# Patient Record
Sex: Female | Born: 1935 | Race: White | Hispanic: No | Marital: Single | Smoking: Never smoker
Health system: Southern US, Community
[De-identification: ages and names within clinical notes are randomized; demographics above are authoritative.]

## PROBLEM LIST (undated history)

## (undated) DIAGNOSIS — N289 Disorder of kidney and ureter, unspecified: Secondary | ICD-10-CM

## (undated) DIAGNOSIS — M51369 Other intervertebral disc degeneration, lumbar region without mention of lumbar back pain or lower extremity pain: Secondary | ICD-10-CM

## (undated) DIAGNOSIS — I10 Essential (primary) hypertension: Secondary | ICD-10-CM

## (undated) DIAGNOSIS — E7800 Pure hypercholesterolemia, unspecified: Secondary | ICD-10-CM

## (undated) DIAGNOSIS — R05 Cough: Secondary | ICD-10-CM

## (undated) DIAGNOSIS — I509 Heart failure, unspecified: Secondary | ICD-10-CM

## (undated) DIAGNOSIS — M1990 Unspecified osteoarthritis, unspecified site: Secondary | ICD-10-CM

## (undated) DIAGNOSIS — E213 Hyperparathyroidism, unspecified: Secondary | ICD-10-CM

## (undated) DIAGNOSIS — J45909 Unspecified asthma, uncomplicated: Secondary | ICD-10-CM

## (undated) DIAGNOSIS — R233 Spontaneous ecchymoses: Secondary | ICD-10-CM

## (undated) DIAGNOSIS — M810 Age-related osteoporosis without current pathological fracture: Secondary | ICD-10-CM

## (undated) DIAGNOSIS — K219 Gastro-esophageal reflux disease without esophagitis: Secondary | ICD-10-CM

## (undated) DIAGNOSIS — R609 Edema, unspecified: Secondary | ICD-10-CM

## (undated) DIAGNOSIS — R238 Other skin changes: Secondary | ICD-10-CM

## (undated) DIAGNOSIS — R059 Cough, unspecified: Secondary | ICD-10-CM

## (undated) DIAGNOSIS — L0390 Cellulitis, unspecified: Secondary | ICD-10-CM

## (undated) DIAGNOSIS — R21 Rash and other nonspecific skin eruption: Secondary | ICD-10-CM

## (undated) DIAGNOSIS — M5136 Other intervertebral disc degeneration, lumbar region: Secondary | ICD-10-CM

## (undated) HISTORY — DX: Essential (primary) hypertension: I10

## (undated) HISTORY — PX: EYE SURGERY: SHX253

## (undated) HISTORY — PX: VITRECTOMY: SHX106

## (undated) HISTORY — PX: CHOLECYSTECTOMY: SHX55

## (undated) HISTORY — PX: BREAST REDUCTION SURGERY: SHX8

## (undated) HISTORY — DX: Cough: R05

## (undated) HISTORY — PX: LIPECTOMY: SHX11

## (undated) HISTORY — DX: Unspecified asthma, uncomplicated: J45.909

## (undated) HISTORY — DX: Other skin changes: R23.8

## (undated) HISTORY — PX: TONSILLECTOMY: SUR1361

## (undated) HISTORY — PX: FEMUR SURGERY: SHX943

## (undated) HISTORY — DX: Gastro-esophageal reflux disease without esophagitis: K21.9

## (undated) HISTORY — DX: Cough, unspecified: R05.9

## (undated) HISTORY — PX: TUBAL LIGATION: SHX77

## (undated) HISTORY — DX: Spontaneous ecchymoses: R23.3

## (undated) HISTORY — DX: Rash and other nonspecific skin eruption: R21

## (undated) HISTORY — PX: APPENDECTOMY: SHX54

## (undated) HISTORY — PX: GALLBLADDER SURGERY: SHX652

## (undated) HISTORY — PX: THYROID SURGERY: SHX805

## (undated) HISTORY — DX: Edema, unspecified: R60.9

## (undated) HISTORY — DX: Unspecified osteoarthritis, unspecified site: M19.90

## (undated) HISTORY — DX: Age-related osteoporosis without current pathological fracture: M81.0

## (undated) HISTORY — PX: PAROTID GLAND TUMOR EXCISION: SHX5221

## (undated) HISTORY — DX: Heart failure, unspecified: I50.9

## (undated) HISTORY — DX: Hyperparathyroidism, unspecified: E21.3

## (undated) HISTORY — PX: FOOT SURGERY: SHX648

---

## 2007-03-24 ENCOUNTER — Ambulatory Visit: Payer: Self-pay | Admitting: Family Medicine

## 2010-11-07 ENCOUNTER — Emergency Department: Payer: Self-pay | Admitting: Emergency Medicine

## 2011-05-16 ENCOUNTER — Ambulatory Visit: Payer: Self-pay | Admitting: Internal Medicine

## 2011-11-27 ENCOUNTER — Ambulatory Visit: Payer: Self-pay | Admitting: Internal Medicine

## 2011-12-17 DIAGNOSIS — H547 Unspecified visual loss: Secondary | ICD-10-CM | POA: Insufficient documentation

## 2012-01-31 DIAGNOSIS — H35359 Cystoid macular degeneration, unspecified eye: Secondary | ICD-10-CM | POA: Insufficient documentation

## 2012-10-30 DIAGNOSIS — N186 End stage renal disease: Secondary | ICD-10-CM | POA: Insufficient documentation

## 2012-10-30 DIAGNOSIS — N184 Chronic kidney disease, stage 4 (severe): Secondary | ICD-10-CM | POA: Insufficient documentation

## 2012-10-30 DIAGNOSIS — N185 Chronic kidney disease, stage 5: Secondary | ICD-10-CM | POA: Insufficient documentation

## 2012-10-30 DIAGNOSIS — L02419 Cutaneous abscess of limb, unspecified: Secondary | ICD-10-CM | POA: Insufficient documentation

## 2012-10-30 DIAGNOSIS — L03119 Cellulitis of unspecified part of limb: Secondary | ICD-10-CM | POA: Insufficient documentation

## 2013-02-02 ENCOUNTER — Ambulatory Visit: Payer: Self-pay | Admitting: Orthopedic Surgery

## 2013-02-04 ENCOUNTER — Inpatient Hospital Stay: Payer: Self-pay | Admitting: Internal Medicine

## 2013-02-04 DIAGNOSIS — Z01810 Encounter for preprocedural cardiovascular examination: Secondary | ICD-10-CM

## 2013-02-04 LAB — CBC
HCT: 40.1 % (ref 35.0–47.0)
HGB: 13.8 g/dL (ref 12.0–16.0)
MCH: 31.2 pg (ref 26.0–34.0)
MCV: 90 fL (ref 80–100)
Platelet: 183 10*3/uL (ref 150–440)
RDW: 14.7 % — ABNORMAL HIGH (ref 11.5–14.5)

## 2013-02-04 LAB — BASIC METABOLIC PANEL
BUN: 33 mg/dL — ABNORMAL HIGH (ref 7–18)
Calcium, Total: 8.9 mg/dL (ref 8.5–10.1)
Co2: 24 mmol/L (ref 21–32)
Glucose: 102 mg/dL — ABNORMAL HIGH (ref 65–99)

## 2013-02-05 LAB — CBC WITH DIFFERENTIAL/PLATELET
HGB: 11.6 g/dL — ABNORMAL LOW (ref 12.0–16.0)
MCH: 31.1 pg (ref 26.0–34.0)
MCHC: 34.6 g/dL (ref 32.0–36.0)
MCV: 90 fL (ref 80–100)
Monocyte #: 0.6 x10 3/mm (ref 0.2–0.9)
Monocyte %: 8.9 %
Neutrophil %: 74.8 %
Platelet: 146 10*3/uL — ABNORMAL LOW (ref 150–440)
RBC: 3.72 10*6/uL — ABNORMAL LOW (ref 3.80–5.20)
WBC: 7.2 10*3/uL (ref 3.6–11.0)

## 2013-02-05 LAB — BASIC METABOLIC PANEL
Anion Gap: 7 (ref 7–16)
Calcium, Total: 8.6 mg/dL (ref 8.5–10.1)
Chloride: 109 mmol/L — ABNORMAL HIGH (ref 98–107)
Co2: 25 mmol/L (ref 21–32)
Creatinine: 1.67 mg/dL — ABNORMAL HIGH (ref 0.60–1.30)
EGFR (Non-African Amer.): 30 — ABNORMAL LOW
Osmolality: 289 (ref 275–301)

## 2013-02-05 LAB — MAGNESIUM: Magnesium: 2.1 mg/dL

## 2013-02-07 LAB — BASIC METABOLIC PANEL
Anion Gap: 9 (ref 7–16)
Anion Gap: 9 (ref 7–16)
BUN: 37 mg/dL — ABNORMAL HIGH (ref 7–18)
BUN: 38 mg/dL — ABNORMAL HIGH (ref 7–18)
Calcium, Total: 7 mg/dL — CL (ref 8.5–10.1)
Chloride: 108 mmol/L — ABNORMAL HIGH (ref 98–107)
Chloride: 107 mmol/L (ref 98–107)
Creatinine: 2.55 mg/dL — ABNORMAL HIGH (ref 0.60–1.30)
Creatinine: 2.92 mg/dL — ABNORMAL HIGH (ref 0.60–1.30)
EGFR (African American): 17 — ABNORMAL LOW
EGFR (Non-African Amer.): 18 — ABNORMAL LOW
EGFR (Non-African Amer.): 15 — ABNORMAL LOW
Glucose: 123 mg/dL — ABNORMAL HIGH (ref 65–99)
Osmolality: 284 (ref 275–301)
Sodium: 136 mmol/L (ref 136–145)

## 2013-02-07 LAB — TRIGLYCERIDES: Triglycerides: 224 mg/dL — ABNORMAL HIGH (ref 0–200)

## 2013-02-07 LAB — CBC WITH DIFFERENTIAL/PLATELET
Basophil #: 0 10*3/uL (ref 0.0–0.1)
Basophil #: 0 10*3/uL (ref 0.0–0.1)
Eosinophil #: 0 10*3/uL (ref 0.0–0.7)
Eosinophil %: 0.2 %
HCT: 19.6 % — ABNORMAL LOW (ref 35.0–47.0)
HGB: 6.7 g/dL — ABNORMAL LOW (ref 12.0–16.0)
HGB: 6.8 g/dL — ABNORMAL LOW (ref 12.0–16.0)
Lymphocyte #: 0.8 10*3/uL — ABNORMAL LOW (ref 1.0–3.6)
Lymphocyte #: 0.7 10*3/uL — ABNORMAL LOW (ref 1.0–3.6)
Lymphocyte %: 5.3 %
MCH: 31.7 pg (ref 26.0–34.0)
MCH: 31.6 pg (ref 26.0–34.0)
MCV: 90 fL (ref 80–100)
MCV: 91 fL (ref 80–100)
Monocyte #: 0.6 x10 3/mm (ref 0.2–0.9)
Monocyte %: 6.5 %
Neutrophil #: 9 10*3/uL — ABNORMAL HIGH (ref 1.4–6.5)
Neutrophil #: 11.3 10*3/uL — ABNORMAL HIGH (ref 1.4–6.5)
Neutrophil %: 85.2 %
Platelet: 173 10*3/uL (ref 150–440)
RBC: 2.1 10*6/uL — ABNORMAL LOW (ref 3.80–5.20)
RBC: 2.14 10*6/uL — ABNORMAL LOW (ref 3.80–5.20)
WBC: 10.6 10*3/uL (ref 3.6–11.0)
WBC: 12.7 10*3/uL — ABNORMAL HIGH (ref 3.6–11.0)

## 2013-02-07 LAB — HEPATIC FUNCTION PANEL A (ARMC)
Albumin: 2 g/dL — ABNORMAL LOW (ref 3.4–5.0)
Alkaline Phosphatase: 73 U/L (ref 50–136)
SGOT(AST): 87 U/L — ABNORMAL HIGH (ref 15–37)
Total Protein: 4.9 g/dL — ABNORMAL LOW (ref 6.4–8.2)

## 2013-02-07 LAB — HEMOGLOBIN
HGB: 6.5 g/dL — ABNORMAL LOW (ref 12.0–16.0)
HGB: 7.6 g/dL — ABNORMAL LOW (ref 12.0–16.0)

## 2013-02-07 LAB — CK TOTAL AND CKMB (NOT AT ARMC)
CK, Total: 396 U/L — ABNORMAL HIGH (ref 21–215)
CK-MB: 5 ng/mL — ABNORMAL HIGH (ref 0.5–3.6)
CK-MB: 6 ng/mL — ABNORMAL HIGH (ref 0.5–3.6)

## 2013-02-07 LAB — MAGNESIUM: Magnesium: 2.2 mg/dL

## 2013-02-08 LAB — COMPREHENSIVE METABOLIC PANEL
Albumin: 1.8 g/dL — ABNORMAL LOW (ref 3.4–5.0)
Alkaline Phosphatase: 63 U/L (ref 50–136)
BUN: 46 mg/dL — ABNORMAL HIGH (ref 7–18)
Bilirubin,Total: 0.3 mg/dL (ref 0.2–1.0)
Calcium, Total: 6.9 mg/dL — CL (ref 8.5–10.1)
Chloride: 109 mmol/L — ABNORMAL HIGH (ref 98–107)
Creatinine: 3 mg/dL — ABNORMAL HIGH (ref 0.60–1.30)
EGFR (African American): 17 — ABNORMAL LOW
EGFR (Non-African Amer.): 15 — ABNORMAL LOW
Osmolality: 289 (ref 275–301)
Potassium: 3.9 mmol/L (ref 3.5–5.1)
SGOT(AST): 48 U/L — ABNORMAL HIGH (ref 15–37)
SGPT (ALT): 12 U/L (ref 12–78)
Total Protein: 4.9 g/dL — ABNORMAL LOW (ref 6.4–8.2)

## 2013-02-08 LAB — TROPONIN I: Troponin-I: <0.02 ng/mL

## 2013-02-08 LAB — CBC WITH DIFFERENTIAL/PLATELET
Basophil #: 0 10*3/uL (ref 0.0–0.1)
Basophil %: 0.1 %
HGB: 6.9 g/dL — ABNORMAL LOW (ref 12.0–16.0)
Lymphocyte #: 0.5 10*3/uL — ABNORMAL LOW (ref 1.0–3.6)
MCH: 30.7 pg (ref 26.0–34.0)
MCHC: 34.1 g/dL (ref 32.0–36.0)
Monocyte #: 0.7 x10 3/mm (ref 0.2–0.9)
Neutrophil #: 10.5 10*3/uL — ABNORMAL HIGH (ref 1.4–6.5)
WBC: 11.8 10*3/uL — ABNORMAL HIGH (ref 3.6–11.0)

## 2013-02-08 LAB — CK TOTAL AND CKMB (NOT AT ARMC)
CK, Total: 420 U/L — ABNORMAL HIGH (ref 21–215)
CK-MB: 4.9 ng/mL — ABNORMAL HIGH (ref 0.5–3.6)

## 2013-02-09 ENCOUNTER — Encounter: Payer: Self-pay | Admitting: Internal Medicine

## 2013-02-09 LAB — RENAL FUNCTION PANEL
BUN: 48 mg/dL — ABNORMAL HIGH (ref 7–18)
Calcium, Total: 7.6 mg/dL — ABNORMAL LOW (ref 8.5–10.1)
Co2: 22 mmol/L (ref 21–32)
EGFR (African American): 19 — ABNORMAL LOW
Glucose: 121 mg/dL — ABNORMAL HIGH (ref 65–99)
Osmolality: 291 (ref 275–301)
Phosphorus: 4 mg/dL (ref 2.5–4.9)
Sodium: 139 mmol/L (ref 136–145)

## 2013-02-10 LAB — CBC WITH DIFFERENTIAL/PLATELET
Basophil #: 0 10*3/uL (ref 0.0–0.1)
Basophil %: 0.2 %
Eosinophil %: 0.2 %
HGB: 9.3 g/dL — ABNORMAL LOW (ref 12.0–16.0)
Lymphocyte %: 4.6 %
MCH: 31.6 pg (ref 26.0–34.0)
Monocyte #: 0.7 x10 3/mm (ref 0.2–0.9)
Neutrophil %: 87.8 %
Platelet: 249 10*3/uL (ref 150–440)
RBC: 2.94 10*6/uL — ABNORMAL LOW (ref 3.80–5.20)
WBC: 10.1 10*3/uL (ref 3.6–11.0)

## 2013-02-10 LAB — BASIC METABOLIC PANEL
BUN: 62 mg/dL — ABNORMAL HIGH (ref 7–18)
Calcium, Total: 8 mg/dL — ABNORMAL LOW (ref 8.5–10.1)
Chloride: 111 mmol/L — ABNORMAL HIGH (ref 98–107)
Co2: 22 mmol/L (ref 21–32)
Creatinine: 2.56 mg/dL — ABNORMAL HIGH (ref 0.60–1.30)
EGFR (African American): 20 — ABNORMAL LOW
EGFR (Non-African Amer.): 18 — ABNORMAL LOW
Glucose: 141 mg/dL — ABNORMAL HIGH (ref 65–99)
Osmolality: 299 (ref 275–301)
Sodium: 140 mmol/L (ref 136–145)

## 2013-02-11 DIAGNOSIS — I359 Nonrheumatic aortic valve disorder, unspecified: Secondary | ICD-10-CM

## 2013-02-12 ENCOUNTER — Encounter: Payer: Self-pay | Admitting: Internal Medicine

## 2013-02-12 LAB — RENAL FUNCTION PANEL
Chloride: 110 mmol/L — ABNORMAL HIGH (ref 98–107)
Co2: 20 mmol/L — ABNORMAL LOW (ref 21–32)
EGFR (African American): 22 — ABNORMAL LOW
Glucose: 127 mg/dL — ABNORMAL HIGH (ref 65–99)
Osmolality: 306 (ref 275–301)
Phosphorus: 4.4 mg/dL (ref 2.5–4.9)
Potassium: 4.2 mmol/L (ref 3.5–5.1)

## 2013-02-13 LAB — URINALYSIS, COMPLETE
Bacteria: NONE SEEN
Bilirubin,UR: NEGATIVE
Blood: NEGATIVE
Glucose,UR: NEGATIVE mg/dL (ref 0–75)
Ketone: NEGATIVE
RBC,UR: <1 /HPF (ref 0–5)
Squamous Epithelial: NONE SEEN

## 2013-02-14 LAB — BASIC METABOLIC PANEL
Anion Gap: 7 (ref 7–16)
Calcium, Total: 7.7 mg/dL — ABNORMAL LOW (ref 8.5–10.1)
Chloride: 109 mmol/L — ABNORMAL HIGH (ref 98–107)
Co2: 23 mmol/L (ref 21–32)
Creatinine: 1.97 mg/dL — ABNORMAL HIGH (ref 0.60–1.30)
EGFR (African American): 28 — ABNORMAL LOW
Osmolality: 311 (ref 275–301)
Potassium: 4.2 mmol/L (ref 3.5–5.1)

## 2013-02-15 LAB — BASIC METABOLIC PANEL
BUN: 88 mg/dL — ABNORMAL HIGH (ref 7–18)
Calcium, Total: 7.6 mg/dL — ABNORMAL LOW (ref 8.5–10.1)
Co2: 23 mmol/L (ref 21–32)
Creatinine: 1.86 mg/dL — ABNORMAL HIGH (ref 0.60–1.30)
EGFR (African American): 30 — ABNORMAL LOW
Glucose: 97 mg/dL (ref 65–99)
Osmolality: 304 (ref 275–301)
Potassium: 4 mmol/L (ref 3.5–5.1)
Sodium: 139 mmol/L (ref 136–145)

## 2013-02-15 LAB — CBC WITH DIFFERENTIAL/PLATELET
Bands: 4 %
HCT: 25 % — ABNORMAL LOW (ref 35.0–47.0)
MCH: 31.5 pg (ref 26.0–34.0)
MCHC: 33.5 g/dL (ref 32.0–36.0)
Monocytes: 4 %
Myelocyte: 4 %
NRBC/100 WBC: 2 /
Platelet: 315 10*3/uL (ref 150–440)
RDW: 15.7 % — ABNORMAL HIGH (ref 11.5–14.5)
Segmented Neutrophils: 75 %

## 2013-02-16 DIAGNOSIS — S72009A Fracture of unspecified part of neck of unspecified femur, initial encounter for closed fracture: Secondary | ICD-10-CM | POA: Insufficient documentation

## 2013-02-16 DIAGNOSIS — W19XXXA Unspecified fall, initial encounter: Secondary | ICD-10-CM | POA: Insufficient documentation

## 2013-02-16 DIAGNOSIS — N179 Acute kidney failure, unspecified: Secondary | ICD-10-CM | POA: Insufficient documentation

## 2013-02-16 LAB — UR PROT ELECTROPHORESIS, URINE RANDOM

## 2013-02-16 LAB — PROTEIN ELECTROPHORESIS(ARMC)

## 2013-02-18 ENCOUNTER — Encounter: Payer: Self-pay | Admitting: Internal Medicine

## 2013-03-14 ENCOUNTER — Encounter: Payer: Self-pay | Admitting: Internal Medicine

## 2013-04-19 ENCOUNTER — Inpatient Hospital Stay: Payer: Self-pay | Admitting: Internal Medicine

## 2013-04-19 LAB — COMPREHENSIVE METABOLIC PANEL
Anion Gap: 5 — ABNORMAL LOW (ref 7–16)
BUN: 36 mg/dL — ABNORMAL HIGH (ref 7–18)
Calcium, Total: 9.1 mg/dL (ref 8.5–10.1)
Chloride: 109 mmol/L — ABNORMAL HIGH (ref 98–107)
Co2: 24 mmol/L (ref 21–32)
Creatinine: 2.14 mg/dL — ABNORMAL HIGH (ref 0.60–1.30)
EGFR (African American): 25 — ABNORMAL LOW
EGFR (Non-African Amer.): 22 — ABNORMAL LOW
Glucose: 127 mg/dL — ABNORMAL HIGH (ref 65–99)
Osmolality: 286 (ref 275–301)
Potassium: 4 mmol/L (ref 3.5–5.1)
SGOT(AST): 26 U/L (ref 15–37)
SGPT (ALT): 18 U/L (ref 12–78)
Sodium: 138 mmol/L (ref 136–145)
Total Protein: 7.1 g/dL (ref 6.4–8.2)

## 2013-04-19 LAB — CBC
HCT: 31.9 % — ABNORMAL LOW (ref 35.0–47.0)
MCH: 31.1 pg (ref 26.0–34.0)
MCHC: 34.3 g/dL (ref 32.0–36.0)
Platelet: 173 10*3/uL (ref 150–440)
RDW: 14.8 % — ABNORMAL HIGH (ref 11.5–14.5)
WBC: 16.3 10*3/uL — ABNORMAL HIGH (ref 3.6–11.0)

## 2013-04-19 LAB — URINALYSIS, COMPLETE
Bilirubin,UR: NEGATIVE
Hyaline Cast: 2
Ketone: NEGATIVE
Ph: 5 (ref 4.5–8.0)
Protein: 30
RBC,UR: <1 /HPF (ref 0–5)
WBC UR: 1 /HPF (ref 0–5)

## 2013-04-19 LAB — CK TOTAL AND CKMB (NOT AT ARMC)
CK, Total: 70 U/L (ref 21–215)
CK-MB: 0.5 ng/mL (ref 0.5–3.6)

## 2013-04-19 LAB — TROPONIN I: Troponin-I: 0.02 ng/mL

## 2013-04-21 LAB — MAGNESIUM: Magnesium: 1.9 mg/dL

## 2013-04-21 LAB — BASIC METABOLIC PANEL
Calcium, Total: 8.1 mg/dL — ABNORMAL LOW (ref 8.5–10.1)
Chloride: 111 mmol/L — ABNORMAL HIGH (ref 98–107)
Co2: 23 mmol/L (ref 21–32)
Creatinine: 2.23 mg/dL — ABNORMAL HIGH (ref 0.60–1.30)
EGFR (Non-African Amer.): 21 — ABNORMAL LOW
Osmolality: 290 (ref 275–301)
Sodium: 141 mmol/L (ref 136–145)

## 2013-04-21 LAB — CBC WITH DIFFERENTIAL/PLATELET
Eosinophil #: 0.1 10*3/uL (ref 0.0–0.7)
Eosinophil %: 2 %
HGB: 9.2 g/dL — ABNORMAL LOW (ref 12.0–16.0)
Lymphocyte %: 16.8 %
MCHC: 34.1 g/dL (ref 32.0–36.0)
MCV: 90 fL (ref 80–100)
Monocyte #: 0.4 x10 3/mm (ref 0.2–0.9)
Monocyte %: 6.6 %
Neutrophil #: 4 10*3/uL (ref 1.4–6.5)
Platelet: 148 10*3/uL — ABNORMAL LOW (ref 150–440)
RBC: 3.01 10*6/uL — ABNORMAL LOW (ref 3.80–5.20)
WBC: 5.3 10*3/uL (ref 3.6–11.0)

## 2013-04-21 LAB — CLOSTRIDIUM DIFFICILE(ARMC)

## 2013-04-22 LAB — BASIC METABOLIC PANEL
BUN: 34 mg/dL — ABNORMAL HIGH (ref 7–18)
Chloride: 112 mmol/L — ABNORMAL HIGH (ref 98–107)
Co2: 24 mmol/L (ref 21–32)
Creatinine: 1.98 mg/dL — ABNORMAL HIGH (ref 0.60–1.30)
EGFR (African American): 28 — ABNORMAL LOW
EGFR (Non-African Amer.): 24 — ABNORMAL LOW
Glucose: 84 mg/dL (ref 65–99)
Potassium: 3.4 mmol/L — ABNORMAL LOW (ref 3.5–5.1)

## 2013-04-22 LAB — MAGNESIUM: Magnesium: 1.8 mg/dL

## 2013-04-23 LAB — BASIC METABOLIC PANEL
Anion Gap: 6 — ABNORMAL LOW (ref 7–16)
BUN: 31 mg/dL — ABNORMAL HIGH (ref 7–18)
Chloride: 112 mmol/L — ABNORMAL HIGH (ref 98–107)
Co2: 23 mmol/L (ref 21–32)
Creatinine: 1.78 mg/dL — ABNORMAL HIGH (ref 0.60–1.30)
EGFR (African American): 32 — ABNORMAL LOW
EGFR (Non-African Amer.): 27 — ABNORMAL LOW
Glucose: 92 mg/dL (ref 65–99)
Potassium: 3.9 mmol/L (ref 3.5–5.1)
Sodium: 141 mmol/L (ref 136–145)

## 2013-04-24 LAB — CULTURE, BLOOD (SINGLE)

## 2013-04-30 DIAGNOSIS — Z09 Encounter for follow-up examination after completed treatment for conditions other than malignant neoplasm: Secondary | ICD-10-CM | POA: Insufficient documentation

## 2013-05-14 ENCOUNTER — Encounter: Payer: Self-pay | Admitting: General Practice

## 2013-05-25 ENCOUNTER — Ambulatory Visit: Payer: Self-pay | Admitting: Family Medicine

## 2013-06-13 ENCOUNTER — Encounter: Payer: Self-pay | Admitting: Podiatry

## 2013-06-14 ENCOUNTER — Encounter: Payer: Self-pay | Admitting: General Practice

## 2013-06-20 ENCOUNTER — Encounter: Payer: Self-pay | Admitting: Podiatry

## 2013-06-20 ENCOUNTER — Other Ambulatory Visit: Payer: Self-pay | Admitting: *Deleted

## 2013-06-20 ENCOUNTER — Ambulatory Visit (INDEPENDENT_AMBULATORY_CARE_PROVIDER_SITE_OTHER): Payer: Medicare Other | Admitting: Podiatry

## 2013-06-20 VITALS — BP 131/69 | HR 79 | Resp 20 | Ht 63.0 in | Wt 220.0 lb

## 2013-06-20 DIAGNOSIS — J45909 Unspecified asthma, uncomplicated: Secondary | ICD-10-CM | POA: Insufficient documentation

## 2013-06-20 DIAGNOSIS — B351 Tinea unguium: Secondary | ICD-10-CM

## 2013-06-20 DIAGNOSIS — M79609 Pain in unspecified limb: Secondary | ICD-10-CM

## 2013-06-20 DIAGNOSIS — I10 Essential (primary) hypertension: Secondary | ICD-10-CM | POA: Insufficient documentation

## 2013-06-20 NOTE — Progress Notes (Signed)
   Subjective:    Patient ID: Misty Berry, female    DOB: 1934/10/25, 79 y.o.   MRN: [id number]  HPI Comments: ROUTINE FOOT CARE, THE NAILS NEED TO BE TRIMMED BADLY I WAS SUPPOSE TO BE HERE EARLIER BUT I HAD BROKEN MY LEG     Review of Systems     Objective:   Physical Exam: Vital signs are stable she is alert and oriented. Pulses are palpable. Nails are thick yellow dystrophic clinically mycotic and painful palpation.        Assessment & Plan:  Assessment: Pain in limb secondary to onychomycosis.  Plan: Debridement of nails 1 through 5 bilateral is cover service secondary to pain.

## 2013-07-15 ENCOUNTER — Encounter: Payer: Self-pay | Admitting: General Practice

## 2013-08-12 ENCOUNTER — Encounter: Payer: Self-pay | Admitting: General Practice

## 2013-09-11 DIAGNOSIS — H348392 Tributary (branch) retinal vein occlusion, unspecified eye, stable: Secondary | ICD-10-CM | POA: Insufficient documentation

## 2013-09-12 ENCOUNTER — Ambulatory Visit: Payer: Medicare Other | Admitting: Podiatry

## 2013-10-31 DIAGNOSIS — I501 Left ventricular failure: Secondary | ICD-10-CM | POA: Insufficient documentation

## 2013-10-31 DIAGNOSIS — I1310 Hypertensive heart and chronic kidney disease without heart failure, with stage 1 through stage 4 chronic kidney disease, or unspecified chronic kidney disease: Secondary | ICD-10-CM | POA: Insufficient documentation

## 2013-12-03 ENCOUNTER — Ambulatory Visit: Payer: Medicare Other | Admitting: Podiatry

## 2014-01-23 ENCOUNTER — Encounter: Payer: Self-pay | Admitting: Podiatry

## 2014-01-23 ENCOUNTER — Ambulatory Visit (INDEPENDENT_AMBULATORY_CARE_PROVIDER_SITE_OTHER): Payer: Medicare Other | Admitting: Podiatry

## 2014-01-23 DIAGNOSIS — M79609 Pain in unspecified limb: Secondary | ICD-10-CM

## 2014-01-23 DIAGNOSIS — B351 Tinea unguium: Secondary | ICD-10-CM

## 2014-01-23 DIAGNOSIS — M79676 Pain in unspecified toe(s): Secondary | ICD-10-CM

## 2014-01-23 NOTE — Progress Notes (Signed)
She presents today chief complaint of painful elongated toenails one through 5 bilateral.  Objective: Pulses are palpable bilateral. Nails are thick yellow dystrophic onychomycotic and painful palpation.  Assessment: Pain in limb secondary to onychomycosis 1 through 5 bilateral.  Plan: Debridement of nails 1 through 5 bilateral.

## 2014-02-19 DIAGNOSIS — R2681 Unsteadiness on feet: Secondary | ICD-10-CM | POA: Insufficient documentation

## 2014-02-19 DIAGNOSIS — F329 Major depressive disorder, single episode, unspecified: Secondary | ICD-10-CM | POA: Insufficient documentation

## 2014-02-19 DIAGNOSIS — F3289 Other specified depressive episodes: Secondary | ICD-10-CM | POA: Insufficient documentation

## 2014-02-19 DIAGNOSIS — F32A Depression, unspecified: Secondary | ICD-10-CM | POA: Insufficient documentation

## 2014-05-01 ENCOUNTER — Ambulatory Visit (INDEPENDENT_AMBULATORY_CARE_PROVIDER_SITE_OTHER): Payer: Medicare Other | Admitting: Podiatry

## 2014-05-01 DIAGNOSIS — B351 Tinea unguium: Secondary | ICD-10-CM

## 2014-05-01 DIAGNOSIS — M79676 Pain in unspecified toe(s): Secondary | ICD-10-CM

## 2014-05-01 NOTE — Progress Notes (Signed)
She presents today with chief complaint of painful elongated toenails.  Objective: Vital signs are stable alert and oriented 3. Pulses are palpable. Nails are thick yellow dystrophic with mycotic.  Assessment: Pain in limb secondary to onychomycosis 1 through 5 bilateral.  Plan: Debridement of nails 1 through 5 bilateral.

## 2014-07-31 ENCOUNTER — Ambulatory Visit (INDEPENDENT_AMBULATORY_CARE_PROVIDER_SITE_OTHER): Payer: Medicare Other | Admitting: Podiatry

## 2014-07-31 ENCOUNTER — Encounter: Payer: Self-pay | Admitting: Podiatry

## 2014-07-31 DIAGNOSIS — B351 Tinea unguium: Secondary | ICD-10-CM | POA: Diagnosis not present

## 2014-07-31 DIAGNOSIS — M79676 Pain in unspecified toe(s): Secondary | ICD-10-CM

## 2014-07-31 NOTE — Progress Notes (Signed)
She presents today with chief complaint of painful elongated toenails.  Objective: Vital signs are stable alert and oriented 3. Pulses are palpable. Nails are thick yellow dystrophic with mycotic.  Assessment: Pain in limb secondary to onychomycosis 1 through 5 bilateral.  Plan: Debridement of nails 1 through 5 bilateral.

## 2014-08-31 DIAGNOSIS — J309 Allergic rhinitis, unspecified: Secondary | ICD-10-CM | POA: Insufficient documentation

## 2014-10-04 NOTE — H&P (Signed)
PATIENT NAME:  Misty Berry, Misty Berry MR#:  [id number] DATE OF BIRTH:  1934/11/11  DATE OF ADMISSION:  04/19/2013  The patient's primary care physician is Dr. Shirley Friar at [facility].   CHIEF COMPLAINT: " I got my cellulitis back again."   HISTORY OF PRESENT ILLNESS: This is a 80 year old female with history of frequent cellulitis. She presents to the ER with both shins being red for the past couple days, and she has been very weak. She could not get up from the toilet.  She does have pain, 2 out of 10, in that right leg. It has been swollen. She recently was at rehab after a hip fracture. Had a prolonged hospitalization with multiple complications, but she is home since Friday and does walk with a walker. She has been having some groin pain, and the major complaint was weakness. In the ER, she was found to have a significant fever of 101.6, and found to have an elevated white count, and hospitalist services were contacted for further evaluation.   PAST MEDICAL HISTORY: Osteoporosis and osteoarthritis, recurrent lower extremity cellulitis, depression, degenerative joint disease, chronic kidney disease stage III, history of asthmatic bronchitis, gastroesophageal reflux disease, hypertension, history of hiatal hernia, blind in left eye, artificial eye, anemia.   PAST SURGICAL HISTORY: Tonsillectomy, tummy tuck, breast reduction, right femur fracture, vitrectomy, parathyroidectomy, left eye removal. On the same surgical procedure, she had an appendectomy, cholecystectomy and tubal ligation.   ALLERGIES: IVP DYE, SOLU-MEDROL, VANCOMYCIN AND PEANUTS.  SOCIAL HISTORY: Currently at home. Quit smoking in 1972. No alcohol. No drug use. Used to work as a Retail buyer for [employer].   FAMILY HISTORY: Father died, 52, of congestive heart failure. Mother died, 70, of cirrhosis of the liver and had esophageal varices.   MEDICATION:  List as per Prescription Writer includes amlodipine 10  mg daily, Dulcolax suppository as needed for constipation, Centrum Silver 1 tablet daily, clobetasol 0.5% applied topically to knees twice a day, Deplin 15 mg daily, Colace/senna 50/8.6, 1 tablet twice a day, ferrous gluconate 324 mg daily, L-theanine 1 tablet 4 times a day, Nord[employer]aturals fish oil 1000 mg 1 tablet daily, Os-Cal plus D 500 mg 200 International Units 1 tablet daily, oxycodone 5 mg every 8 hours as needed for pain, Symbicort 160/4.5, 1 puff twice a day; triamcinolone topical 0.1% topical to shins as needed, Wellbutrin XL 150 mg daily, Zoloft 150 mg daily.   REVIEW OF SYSTEMS:  CONSTITUTIONAL: Positive for fever. No chills. No sweats. Positive for weight loss, 25 pounds. Positive for weakness, also lightheadedness.  EYES: She does wear glasses. Her left eye is an artificial eye.  EARS, NOSE, MOUTH AND THROAT: Positive for ringing in the ears and dry mouth.  CARDIOVASCULAR: No chest pain. No palpitations.  RESPIRATORY: No shortness of breath. No coughing. No sputum. No hemoptysis.  GASTROINTESTINAL: No nausea. No vomiting. No abdominal pain. No diarrhea. No constipation. No bright red blood per rectum. No melena.  GENITOURINARY: No burning on urination. No hematuria.  MUSCULOSKELETAL: Positive for back pain. Right leg pain 2 out of 10 in intensity.  INTEGUMENTARY: Positive for right lower extremity cellulitis.  NEUROLOGIC: Lightheadedness. No fainting or blackouts.  PSYCHIATRIC: Positive for anxiety and depression.  ENDOCRINE: No thyroid problems.  HEMATOLOGIC AND LYMPHATIC: Positive for anemia.   PHYSICAL EXAMINATION: VITAL SIGNS: On presentation to the hospital included a temperature of 101.6, pulse 89, respirations 20, blood pressure 183/74, pulse ox 97% on room air.  GENERAL: No  respiratory distress.  EYES: Conjunctivae and lids normal. Left eye is artificial eye. Right eye pupil equal, round and reactive to light.  EARS, NOSE, MOUTH AND THROAT: Tympanic membrane blocked by  wax. Nasal mucosa: No erythema. Throat: No erythema. No exudate seen slight. Dry lips. LUNGS: Clear to auscultation. No use of accessory muscles to breathe. No rhonchi, rales or wheeze heard.  CARDIOVASCULAR SYSTEM:  S1, S2 normal. No gallops, rubs or murmurs heard. Carotid upstroke 2+ bilaterally. No bruits.  EXTREMITIES: Dorsalis pedis pulses 2+ bilaterally, 3+ edema bilateral lower extremity.  ABDOMEN: Soft, nontender. No organomegaly/splenomegaly. Normoactive bowel sounds. No masses felt.  LYMPHATIC: No lymph nodes in the neck. Positive small lymph nodes in the right groin.  MUSCULOSKELETAL: No clubbing or cyanosis, 3+ edema.  SKIN: Positive for erythema, right shin, with positive warmth going up beyond the knee medially in the right groin. There is a scab on the right groin which is probably the etiology of her cellulitis. The left lower extremity does look like chronic venous stasis.  NEUROLOGIC: Cranial nerves II through XII grossly intact. Deep tendon reflexes 2+ bilateral lower extremities.  PSYCHIATRIC: The patient is oriented to person, place and time.   LABORATORY AND RADIOLOGICAL DATA: White blood cell count 16.3, H and H 10.9 and 31.9, platelet count 173. Glucose 127, BUN 36, creatinine 2.14, sodium 138, potassium 4.0, chloride 109, CO2 of 24, calcium 9.1. Liver function tests normal range. Albumin low at 3.1. Troponin negative. Ultrasound bilateral lower extremities: Negative for DVT.   EKG: Normal sinus rhythm, 94 beats per minute, LVH, nonspecific ST-T wave changes.   ASSESSMENT AND PLAN: 1.  Clinical sepsis with cellulitis, fever and leukocytosis with history of methicillin-resistant Staphylococcus aureus in the past. ER physician gave IV clindamycin. I will continue that at this point. I will follow up blood cultures and vital signs closely. The likely etiology is a small scab in the right groin above the knee.  2.  Acute renal failure on chronic kidney disease. We will give  gentle IV fluid hydration and continue to monitor.  3.  Hypertension. Continue Norvasc.  4.  Anxiety, depression, I will hold on the patient's Wellbutrin at this point in time, since she did tell me that she did have a seizure. We will continue the patient's Zoloft.  5.  Anemia. Continue to monitor, on ferrous gluconate.  6.  Gastroesophageal reflux disease: Continue PPI.   TIME SPENT ON ADMISSION: 55 minutes.   CODE STATUS: The patient is a FULL CODE.    ____________________________ Tana Conch. Leslye Peer, MD rjw:dmm D: 04/19/2013 13:11:01 ET T: 04/19/2013 13:23:12 ET JOB#: [id number]  cc: Tana Conch. Leslye Peer, MD, <Dictator> Shirley Friar, MD [place] MD ELECTRONICALLY SIGNED 04/19/2013 13:50

## 2014-10-04 NOTE — H&P (Signed)
PATIENT NAME:  Misty Berry, Misty Berry MR#:  [id number] DATE OF BIRTH:  1934/09/30  DATE OF ADMISSION:  02/04/2013  REFERRING PHYSICIAN:  Dr. Kerman Passey.  PRIMARY CARE PHYSICIAN:  Dr. Purcell Mouton.   CHIEF COMPLAINT:  Hip pain, status post fall.   HISTORY OF PRESENT ILLNESS:  The patient is a 80 year old obese Caucasian female with history of osteoporosis and osteoarthritis, CKD stage 3, depression and history of right hip fracture status post pinning and then de-pinning.  She was at her usual state of self until this morning where she tripped in the bathroom and had a fall resulting in right hip pain, was brought in here and was noted to have a fracture.  Orthopedic has already seen the patient.  The fall was mechanical as she tripped over a raised area below the door that separates the rooms.  She tripped over that raised area and had a fall.  She had no loss of consciousness.  No chest pain prior or after the fall.  Hospitalist services were contacted for further evaluation and management.   PAST MEDICAL HISTORY:  1.  History of osteoporosis and osteoarthritis.  2.  History of recurrent lower extremity cellulitis.  3.  Depression.  4.  Degenerative joint disease,  5.  CKD stage 3.   6.  History of asthmatic bronchitis.  7.  History of left eye blindness status post artificial eye.  8.  GERD.  9.  Hypertension.  10.  History of a hip fracture on the right.  11.  History of hiatal hernia.  12.  History of adenoma.  13.  History of tonsillectomy.  14.  Appendectomy.  15.  Cholecystectomy.  16.  History of tummy tuck.  17.  History of breast reduction.  18.  History of vitrectomy.  19.  History of parathyroidectomy.   ALLERGIES:  IV DYE, SOLU-MEDROL, VANCOMYCIN AND PEANUTS.   SOCIAL HISTORY:  No tobacco, alcohol or drug use.  Retired, sometimes lives with herself, sometimes with family and friends.    FAMILY HISTORY:  Denies history of hypertension, diabetes or congestive heart failure.    OUTPATIENT MEDICATIONS:  Amlodipine 10 mg daily, Centrum Silver 1 tab daily, clobetasol topical 0.05% apply topically to knees twice daily, Deplin 15 mg once a day, ferrous gluconate 325 mg daily, L-Theanine 1 tab 4 times a day, 1 tab once a day, Nexium 40 mg daily, fish oil 1000 mg daily, Os-Cal 500 mg with D once a day, Symbicort 160/4.5 mcg 1 puff 2 times a day, triamcinolone apply to shins once a day as needed, Wellbutrin 150 mg extended-release 1 tab daily, Zoloft 150 mg at bedtime.   REVIEW OF SYSTEMS:  CONSTITUTIONAL:  Weight is steady.  No weight changes.  No fevers, fatigue or weakness.  EYES:  History of left eye blindness, now has artificial eye.  EARS, NOSE, THROAT:  No tinnitus, hearing loss, snoring or postnasal drip.  RESPIRATORY:  Denies cough, wheezing.  Has a history of asthmatic bronchitis.  Has occasional cough.  No painful respirations or COPD.  CARDIOVASCULAR:  No chest pain.  Has occasional lower extremity edema.  No palpitations.  No hypertension.  No history of CAD, MI or CHF.  Has hypertension.  GASTROINTESTINAL:  No nausea, vomiting, diarrhea, constipation, abdominal pain, melena, dark stools or bloody stools.  GENITOURINARY:  Denies dysuria or hematuria.  HEMATOLOGIC AND LYMPHATIC:  Denies anemia or easy bruising.  SKIN:  Has a history of recurrent lower extremity cellulitis, last attack was in May of  this year.  MUSCULOSKELETAL:  Has osteoporosis and osteoarthritis.  NEUROLOGIC:  Denies focal weakness or numbness.  PSYCHIATRIC:  Has depression.   PHYSICAL EXAMINATION: VITAL SIGNS:  Temperature 98, pulse rate 62, respiratory rate 24 initially, initial blood pressure 167/69, O2 sat was 93% on room air.  GENERAL:  The patient is an obese Caucasian female lying in bed in no obvious distress.  HEENT:  Normocephalic, atraumatic.  Pupil on the right is reactive.  Extraocular muscles intact.  Left has artificial eye.  Moist mucous membranes.  NECK:  Supple.  No thyroid  tenderness.  No cervical lymphadenopathy.  CARDIOVASCULAR:  S1, S2, regularly regular.  No murmurs, rubs or gallops.  LUNGS:  Clear to auscultation anteriorly as it was difficult to auscultate posteriorly as it could elicit significant pain in the limb.  ABDOMEN:  Soft, nontender, nondistended.  Positive bowel sounds in all quadrants, hyperactive.  No rebound or guarding.  EXTREMITIES:  Right lower extremity is externally rotated and shortened compared to the left.  Has some chronic venous stasis changes, 1+ edema in the lower extremities.  NEUROLOGIC:  Cranial nerves II through XII grossly intact.  Strength is 5 out of 5 in bilateral upper extremities, did not test lower extremities secondary to elicitation of pain in the right hip.  PSYCHIATRIC:  Awake, alert, oriented x 3.  Cooperative, pleasant, conversant.   LABORATORY AND DIAGNOSTIC STUDIES:  CT of head without contrast showing no evidence of acute intracranial hemorrhage or evolving ischemic infarct, moderate diffuse cerebral and cerebellar atrophy with compensatory ventriculomegaly.  There is encephalomalacia over the right posterior parietal region.  Glucose 102, BUN 33, creatinine 1.47, sodium 140, potassium 4.1.  WBC 10.7, hemoglobin 13.8, platelets are 183.  INR is 1.  EKG:  Sinus rhythm with sinus arrhythmia.  No acute ST elevations or depressions.  X-ray of the chest done, results pending.   ASSESSMENT AND PLAN:  We have a 80 year old female with hypertension, depression, osteoporosis, history of right hip fracture status post pinning, who had a mechanical fall and recurrent hip fracture.  She had no loss of consciousness nor cardiac symptoms before or after the event.  Currently, she is chest pain-free.  She has no history of myocardial infarction, congestive heart failure or stroke.  Although she has chronic kidney disease, her creatinine is less than 2.  She has a METS of at least 4.  She is a low to moderate risk surgical patient, but we  would not recommend further cardiac work-up and she may proceed to OR without further cardiac work-up.  Start her on her amlodipine.  Pain control and DVT prophylaxis per ortho who has already seen the patient.  We would also add as needed hydralazine in case her systolic blood pressure goes over 180.  We will continue her Nexium and depression medications.  She has chronic kidney disease and per her it is stage 3 and this appears to be stable.  We have started her on some gentle fluids.  Her INR is not supratherapeutic.  I would continue her Symbicort for her asthmatic bronchitis.  The patient is a FULL CODE.   Total time spent is 50 minutes.    ____________________________ Vivien Presto, MD sa:ea D: 02/04/2013 16:29:47 ET T: 02/04/2013 18:17:30 ET JOB#: [id number]  cc: Vivien Presto, MD, <Dictator> Vivien Presto MD ELECTRONICALLY SIGNED 03/06/2013 13:53

## 2014-10-04 NOTE — Consult Note (Signed)
Referring Physician:  Vivien Presto :   Primary Care Physician:   Vivien Presto : [place], [facility], [place], [place], [place] [address], [place] [phone]  Reason for Consult: Admit Date: 05-Feb-2013  Chief Complaint: L hip fracture  Reason for Consult: seizure   History of Present Illness: History of Present Illness:   80 yo RHD F POD#1 s/p L hip reduction who had some seizure like activity this morning.  No witnesses are present and current nursing staff did not see even but heard that it was some type of shaking followed by a code requiring pt to be intubated.  Pt is now intubated and can only nod to certain things like she does not remember episode.  ROS:  Review of Systems   unobtainable secondary to intubation  Past Medical/Surgical Hx:  hiatel hernia:   gerd:   asthma:   osteoporosis:   htn:   adenaoma:   cellulitis:   tonsilectomy:   appendectomy:   cholecystectomy:   parotid gland mixed cell, benign:   pin right femur:   removal pin right femur:   abdoominal belt lipectomy:   parotid gland mixed:   breast reduction:   vitrectomy:   enuculation of left eye, corneal transplant:   parathyroidectomy:   Past Medical/ Surgical Hx:  Past Medical History 1.  History of osteoporosis and osteoarthritis.  2.  History of recurrent lower extremity cellulitis.  3.  Depression.  4.  Degenerative joint disease,  5.  CKD stage 3.   6.  History of asthmatic bronchitis.  7.  History of left eye blindness status post artificial eye.  8.  GERD.  9.  Hypertension.  10.  History of a hip fracture on the right.  11.  History of hiatal hernia.  12.  History of adenoma.  13.  History of tonsillectomy.  14.  Appendectomy.  15.  Cholecystectomy.  16.  History of tummy tuck.  17.  History of breast reduction.  18.  History of vitrectomy.  19.  History of parathyroidectomy.   Past Surgical History as above   Home  Medications: Medication Instructions Last Modified Date/Time  amLODIPine 10 mg oral tablet 1 tab(s) orally once a day 24-Aug-14 16:19  Nexium 40 mg oral delayed release capsule 1 cap(s) orally once a day 24-Aug-14 16:19  Deplin 15 mg oral capsule 1 cap(s) orally once a day 24-Aug-14 16:19  Centrum Silver Therapeutic Multiple Vitamins with Minerals oral tablet 1 tab(s) orally once a day 24-Aug-14 16:19  Symbicort 160 mcg-4.5 mcg/inh inhalation aerosol 1 puff(s) inhaled 2 times a day 24-Aug-14 16:19  Os-Cal 500 with D 1 tab(s) orally once a day 24-Aug-14 16:19  milk thistle - oral tablet 1 tab(s) orally once a day 24-Aug-14 16:19  nordic natural fish oil 106m capsule 1 cap(s) orally once a day 24-Aug-14 16:19  ferrous gluconate 324 mg oral tablet 1 tab(s) orally once a day 24-Aug-14 16:19  Wellbutrin XL 150 mg/24 hours oral tablet, extended release 1 tab(s) orally once a day. *brand name* 24-Aug-14 16:19  triamcinolone topical 0.1% topical ointment Apply topically to shins once a day as needed. 24-Aug-14 16:19  clobetasol topical 0.05% topical ointment Apply topically to knees 2 times a day. 24-Aug-14 16:19  Zoloft 100 mg oral tablet 1.5 tabs (1590m orally once a day. *brand name* 24-Aug-14 16:19  l-theanine tablet 1 tab(s) orally 4 times a day 24-Aug-14 16:19   Allergies:  Peanuts: Unknown  Solu-Medrol: Alt Ment Status  vancomycin: Itching  IVP Dye: Hives  Allergies:  Allergies NKDA    Social/Family History: Employment Status: retired  Lives With: significant other  Living Arrangements: house  Social History: no tob, no EtOH, no illicits per chart  Family History: unobtainable   Vital Signs: **Vital Signs.:   27-Aug-14 09:30  Vital Signs Type Recheck  Pulse Pulse 84  Respirations Respirations 19  Systolic BP Systolic BP 92  Diastolic BP (mmHg) Diastolic BP (mmHg) 27  Mean BP 48  Pulse Ox % Pulse Ox % 100  Oxygen Delivery Ventilator Assisted  Pulse Ox Heart Rate 84    Physical Exam: General: obese, mild distress, looks older than age  34: normocephalic, sclera nonicteric with L glass eye, oropharynx clear  Neck: supple, no JVD, no bruits  Chest: CTA B, no wheezing, good movement  Cardiac: RRR, no murmurs, 2+ pulses  Extremities: trace edema, no C/C; limited movement of L hip   Neurologic Exam: Mental Status: intubated, sedated, opens and follows simple commands, GCS 10T  Cranial Nerves: R 42m and reactive, L eye glass, face symmetric, good cough  Motor Exam: 5-/5 B, nl tone  Deep Tendon Reflexes: 1+/4 B, down going plantars  Sensory Exam: intact to light touch  Coordination: untestable   Lab Results:  Routine BB:  24-Aug-14 22:05   ABO Group + Rh Type O Positive  Antibody Screen NEGATIVE (Result(s) reported on 04 Feb 2013 at 10:56PM.)  Routine Chem:  25-Aug-14 04:17   Magnesium, Serum 2.1 (1.8-2.4 THERAPEUTIC RANGE: 4-7 mg/dL TOXIC: > 10 mg/dL  -----------------------)  27-Aug-14 05:15   Calcium (Total), Serum  7.1    08:47   Glucose, Serum  170  BUN  38  Creatinine (comp)  2.92  Sodium, Serum 136  Potassium, Serum 4.0  Chloride, Serum 107  CO2, Serum  20  Anion Gap 9  Osmolality (calc) 285  eGFR (African American)  17  eGFR (Non-African American)  15 (eGFR values <654mmin/1.73 m2 may be an indication of chronic kidney disease (CKD). Calculated eGFR is useful in patients with stable renal function. The eGFR calculation will not be reliable in acutely ill patients when serum creatinine is changing rapidly. It is not useful in  patients on dialysis. The eGFR calculation may not be applicable to patients at the low and high extremes of body sizes, pregnant women, and vegetarians.)  Routine Coag:  24-Aug-14 14:09   Prothrombin 13.0  INR 1.0 (INR reference interval applies to patients on anticoagulant therapy. A single INR therapeutic range for coumarins is not optimal for all indications; however, the suggested range for  most indications is 2.0 - 3.0. Exceptions to the INR Reference Range may include: Prosthetic heart valves, acute myocardial infarction, prevention of myocardial infarction, and combinations of aspirin and anticoagulant. The need for a higher or lower target INR must be assessed individually. Reference: The Pharmacology and Management of the Vitamin K  antagonists: the seventh ACCP Conference on Antithrombotic and Thrombolytic Therapy. ChZJIRC.7893ept:126 (3suppl): [id number] HCT value >55% may artifactually increase the PT.  In one study,  the increase was an average of 25%. Reference:  "Effect on Routine and Special Coagulation Testing Values of Citrate Anticoagulant Adjustment in Patients with High HCT Values." American Journal of Clinical Pathology 2006;126:400-405.)  Routine Hem:  27-Aug-14 08:47   WBC (CBC)  12.7  RBC (CBC)  2.14  Hemoglobin (CBC)  6.8  Hematocrit (CBC)  19.6  Platelet Count (CBC) 173  MCV 91  MCH 31.6  MCHC  34.7  RDW  14.9  Neutrophil % 89.5  Lymphocyte % 5.3  Monocyte % 4.7  Eosinophil % 0.2  Basophil % 0.3  Neutrophil #  11.3  Lymphocyte #  0.7  Monocyte # 0.6  Eosinophil # 0.0  Basophil # 0.0 (Result(s) reported on 07 Feb 2013 at 09:20AM.)   Radiology Results: CT:    24-Aug-14 14:36, CT Head Without Contrast  CT Head Without Contrast   REASON FOR EXAM:    fall, hematoma  COMMENTS:       PROCEDURE: CT  - CT HEAD WITHOUT CONTRAST  - Feb 04 2013  2:36PM     RESULT: Axial noncontrast CT scanning was performed through the brain   with reconstructions at 5 mm intervals and slice thicknesses.    There is mild diffuse cerebral and cerebellar atrophy with compensatory   ventriculomegaly. There is no intracranial hemorrhage nor intracranial   mass effect. There is no evidence of an evolving ischemic infarction. The   cerebellum and brainstem exhibit no acute abnormalities.    At bone window settings the observed portions of the paranasal sinuses    and mastoid air cells are clear. There is no evidence of an acute skull     fracture. There is soft tissue swelling in the high right posterior   parietal region.    IMPRESSION:   1. There is no evidence of an acute intracranial hemorrhage nor of an   evolving ischemic infarction.  2. There is moderate diffuse cerebral and cerebellar atrophy with   compensatory ventriculomegaly.  3. There is a cephalohematoma over the high right posterior parietal   region. There is no underlying skull fracture.     Dictation Site: 5        Verified By: DAVID A. Martinique, M.D., MD   Radiology Impression: Radiology Impression: CT of head personally reviewed by me and shows moderate white matter changes more prominent in L frontal lobe   Impression/Recommendations: Recommendations:   labs reviewed by me and mild kidney dysfunction notes reviewed by me d/w Dr. Mortimer Fries   Seizure-  this appears to be provoked by multiple medical problems and bupropion.  There is also a possiblility that pt was hypocalcemic too which lowers seizure threshold.  No documentation of sugars.   Renal dysfunction-  appears to be worsening EEG pending MRI of brain when stable hold anti-epileptics for now keep Ca > 8, Mg > 2, and Na > 130 d/c bupropion will follow  Electronic Signatures: Jamison Neighbor (MD)  (Signed 27-Aug-14 11:54)  Authored: REFERRING PHYSICIAN, Primary Care Physician, Consult, History of Present Illness, Review of Systems, PAST MEDICAL/SURGICAL HISTORY, HOME MEDICATIONS, ALLERGIES, Social/Family History, NURSING VITAL SIGNS, Physical Exam-, LAB RESULTS, RADIOLOGY RESULTS, Recommendations   Last Updated: 27-Aug-14 11:54 by Jamison Neighbor (MD)

## 2014-10-04 NOTE — Op Note (Signed)
PATIENT NAME:  Misty Berry, Misty Berry MR#:  [id number] DATE OF BIRTH:  May 18, 1935  DATE OF PROCEDURE:  02/05/2013  PREOPERATIVE DIAGNOSIS: Right intertrochanteric femur fracture.   POSTOPERATIVE DIAGNOSIS: Right intertrochanteric femoral neck fracture.   PROCEDURE PERFORMED: Open reduction and internal fixation of right intertrochanteric femur fracture.   SURGEON: Laurice Record. Hooten, MD  ANESTHESIA: Spinal.   ESTIMATED BLOOD LOSS: 650 mL.   FLUIDS REPLACED: 1700 mL of crystalloid.   DRAINS: None.   IMPLANTS UTILIZED: Synthes 130 degrees 11 mm x 380 mm trochanteric fixation nail and A999333 mm helical blade.   INDICATIONS FOR SURGERY: The patient is a 80 year old female who fell and landed on her right hip and side. She was unable to stand or bear weight due to the hip pain. X-rays demonstrated a displaced right intertrochanteric femur fracture. Of note, the patient had previously sustained a hip fracture and underwent and internal fixation with subsequent removal of hardware in the remote past. X-rays did demonstrate some residual deformity from the previous fracture. After discussion of the risks and benefits of surgical intervention, the patient expressed understanding of the risks and benefits and agreed with plans for surgical intervention.   PROCEDURE IN DETAIL: The patient was brought to the operating room and after adequate spinal anesthesia was achieved, the patient was transferred to the fracture table. Traction was applied to the right lower extremity and all bony prominences were well padded. Provisional reduction was performed and confirmed using C-arm. The patient's right hip and leg were cleaned and prepped with alcohol and DuraPrep draped in the usual sterile fashion. A "timeout" was performed as per usual protocol. A lateral longitudinal incision was made extending from approximately the tip of the trochanter proximally. Fascia was incised in line with the skin incision, and the fibers of the  gluteus were split in line. Dissection was carried down until the tip of the trochanter was palpable. A distally threaded guide pin was inserted and advanced down the canal. Position was confirmed in both AP and lateral planes using the C-arm. A Step drill was used to enlarge the entry site. A beaded guidewire was then advanced through the opening and down the shaft. Position was confirmed using C-arm. Measurements were obtained and it was felt that a 380 mm long nail was appropriate. The canal was reamed in a sequential fashion up to a 12 mm diameter. A 130 degrees, 11 mm x 380 mm trochanteric fixation nail was then advanced down the guidewire. Good position was noted. Guidewire was removed. Next, a second stab incision was made along the lateral aspect of the thigh and a tissue protector was inserted through the outrigger device and advanced to the lateral cortex. A threaded guide pin was inserted into the femoral neck and head. Good position was noted in both AP and lateral planes using the C-arm. Measurements were obtained and it was felt that a A999333 mm helical blade was appropriate length. Cortex was reamed followed by insertion of a cannulated reamer. Next, a A999333 mm helical blade was advanced over the guidewire and impacted into place. Good position was noted in multiple planes using the C-arm. Some compression was applied to the site. The locking sleeve was then engaged proximally and the tissue protector was removed. Stable fixation was appreciated. It was elected not to lock the nail distally.   The wounds were irrigated with copious amounts of normal saline with antibiotic solution using bulb syringe and then suctioned dry. Hemostasis was achieved using electrocautery. Fascia was  closed using interrupted sutures of #1 Vicryl. The subcutaneous tissue was approximated in layers using first #0 Vicryl followed by 2-0 Vicryl. Skin was closed with skin staples. Sterile dressing was applied.   The patient  tolerated the procedure well. She was transported to the recovery room in stable condition.    ____________________________ Laurice Record. Holley Bouche., MD jph:dp D: 02/06/2013 03:40:00 ET T: 02/06/2013 08:20:57 ET JOB#: [id number]  cc: Jeneen Rinks P. Holley Bouche., MD, <Dictator> JAMES P Holley Bouche MD ELECTRONICALLY SIGNED 02/21/2013 7:16

## 2014-10-04 NOTE — Op Note (Signed)
PATIENT NAME:  Misty Berry, COONTZ MR#:  [id number] DATE OF BIRTH:  Nov 28, 1934  DATE OF PROCEDURE:  02/07/2013  PREOPERATIVE DIAGNOSIS: Recent pulseless electrical activity (PEA) arrest and poor intravenous access.   POSTOPERATIVE DIAGNOSIS: Recent pulseless electrical activity (PEA) arrest and poor intravenous access.   PROCEDURE: Ultrasound-guided left femoral vein central venous catheter placement.  ESTIMATED BLOOD LOSS: 10 mL.   COMPLICATIONS: None.   SPECIMENS: None.   INDICATION FOR SURGERY: [name] is a pleasant 80 year old female who recently went into PEA arrest and was transferred to the ICU. She had poor IV access and I was consulted for central line placement.   DETAILS OF PROCEDURE: No informed consent was obtained as this was declared an emergency. The patient's internal jugular veins were visualized with an ultrasound. They were found to be very small caliber and collapsed easily indicative of dehydration. I then proceeded to look at her non-operated left groin. On ultrasound, her femoral vein was somewhat larger in caliber there. I then prepped and draped her left groin in the standard surgical fashion. A timeout was then performed, correctly identifying the patient name, operative site and procedure performed.   Using an ultrasound, I accessed the left femoral vein. A wire was placed through the access needle. The needle was withdrawn. The tract was dilated and a flushed central venous catheter was placed over the wire. The wire was removed. All 3 ports were flushed and drew easily. The catheter was then sutured in place with 5-0 silk sutures. A sterile dressing was then placed over the wound. There were no immediate complications. Needle, sponge and instrument counts were correct at the end of the procedure.    ____________________________ Glena Norfolk. Laiyah Exline, MD cal:np D: 02/07/2013 17:45:00 ET T: 02/07/2013 19:59:24 ET JOB#: [id number]  cc: Harrell Gave A. Mosetta Ferdinand,  MD, <Dictator>   Floyde Parkins MD ELECTRONICALLY SIGNED 02/09/2013 9:26

## 2014-10-04 NOTE — Consult Note (Signed)
PATIENT NAME:  Misty Berry, Misty Berry MR#:  [id number] DATE OF BIRTH:  1935-04-09  DATE OF CONSULTATION:  02/04/2013  CONSULTING PHYSICIAN:  Mali E. Enes Rokosz, MD  REASON FOR CONSULTATION: Right hip pain.   HISTORY OF PRESENT ILLNESS: This is a 80 year old female who fell and sustained injury to her right hip. She complained of pain in the right hip rated as a 7 out of 10, sharp in nature, exacerbated by movement of the right lower extremity and relieved by rest. The patient denies any injury to the other 3 extremities. She does have a history of injuring this same hip back in the early 1980s for which she underwent an ORIF as well as a subsequent hardware removal.   PAST MEDICAL HISTORY: Hiatal hernia, GERD, asthma, osteoporosis, hypertension, cellulitis.   PAST SURGICAL HISTORY: Tonsillectomy, appendectomy, cholecystectomy, vitrectomy, parathyroidectomy, ORIF of right hip fracture with subsequent hardware removal   MEDICATIONS: Zoloft, Wellbutrin, triamcinolone cream, Symbicort, Os-Cal, fish oil, Nexium, milk thistle, iron, Deplin, Clobetasol topical, Centrum Silver, amlodipine.   ALLERGIES: IVP DYE, SOLU-MEDROL, VANCOMYCIN AND PEANUTS.   SOCIAL HISTORY: The patient denies any alcohol or tobacco use.   FAMILY HISTORY: Noncontributory.   REVIEW OF SYSTEMS: No chest pain, no shortness of breath.   PHYSICAL EXAMINATION: GENERAL: The patient is well-appearing, well-nourished, no acute distress.  EXTREMITIES: Examination of the right hip reveals overlying skin to be intact without erythema or ecchymosis. She has tenderness to palpation over the greater trochanter. She has good range of motion of the knee and ankle, 5 out of 5 strength on EHL function. No knee or ankle instability. Intact sensation distally and good distal pulses. Examination of the left hip was performed in a similar manner and was free of any abnormalities.  SKIN: There are some areas of erythema along the shins as well as extending more  posteriorly along the site of previous plastic surgical procedures predominantly on the right leg but also present on the left.   IMAGES: X-rays of the left hip were obtained and reviewed which reveal a comminuted right intertrochanteric fracture with some residual deformity from her previous fracture and fixation.   IMPRESSION: A 80 year old female with a right hip intertrochanteric fracture.   PLAN: The patient will be admitted to the hospitalist team. She will undergo appropriate clearance. She will require operative intervention in the form of ORIF versus IM nailing. She will be n.p.o. after midnight, and appropriate surgical consent will be obtained.   ____________________________ Mali E. Analisa Sledd, MD ces:cb D: 02/04/2013 22:01:19 ET T: 02/04/2013 22:10:52 ET JOB#: [id number]  cc: Mali E. Dalesha Stanback, MD, <Dictator> Mali E Kerrington Sova MD ELECTRONICALLY SIGNED 02/08/2013 9:23

## 2014-10-04 NOTE — Discharge Summary (Signed)
PATIENT NAME:  Misty Berry, Misty Berry MR#:  [id number] DATE OF BIRTH:  12-Mar-1935  ADDENDUM TO INTERIM DISCHARGE SUMMARY  Interim discharge summary was dictated by Dr. Vianne Bulls on September 2.   I saw the patient on September 5. At this time, the patient is waiting for transfer to [facility] according to her and family request.   DIET:  1.  Regular diet.  2.  Ensure 240 mL t.i.d.   DISCHARGE INSTRUCTIONS:  1.  Wound VAC instructions per ortho recommendations.  2.  Physical therapy.   FOLLOWUP: Dr. Marry Guan, ortho, in 2 weeks.   CURRENT MEDICATIONS:  1.  Tylenol Extra Strength 500 mg q.4 p.r.n.  2.  Dulcolax 10 mg rectal daily p.r.n.  3.  Magnesium hydroxide 30 mL b.i.d. p.r.n.  4.  Zofran ODT 4 mg q.4 p.r.n.  5.  Zoloft 150 mg daily.  6.  Oxycodone 5 mg q.8 p.r.n.  7.  Alprazolam 0.5 mg q.8 p.r.n.  8.  Lovenox 40 mg subcu daily for 10 more days.  9.  Central line care per protocol. The patient will go with a central line if she is going to [facility] for transfer. Else, central line will be discontinued.  10.  Protonix 40 mg daily.  11.  Temovate clobetasol 0.05% apply to affected area b.i.d.  12.  Nystatin, apply to affected area t.i.d. p.r.n.  13.  Amlodipine 10 mg daily. Currently it is on hold. Resume once systolic blood pressure greater than 120.  14.  Budesonide 160 mg/4.5 mcg two puffs b.i.d.  15.  Calcium carbonate with vitamin D 1 tablet daily.  16.  Multivitamin daily.  17.   Levaquin 750 p.o. q.48 hourly. Last dose 7th September.   [place] COURSE AS OUTLINED BY DR: Vianne Bulls: The patient currently is medically best at baseline at this time. The patient has a bed available at [facility] facility for acute rehab; however, the patient along with family is requesting transfer to [facility] for further rehabilitation and care.   We are waiting for [facility] to have a bed available for her. The patient does understand. Above was discussed with Dr. Marry Guan, who did reiterate again above with the  patient in person.   CODE STATUS: FULL CODE.   DIAGNOSTIC STUDIES: X-ray, femur, done on September 4 shows intramedullary rod extending into the subtrochanteric region with dynamic locking screw through the intertrochanteric region, femoral neck, and into the femoral head and skin staples are present. The current projection may be slightly different. Does not appear to have gross angulation. Comparison to previous study.   Chest x-ray done on September 3: No acute disease of the chest.   LABORATORIES: Hemoglobin and hematocrit are 8.4 and 25.0. White count is 14.4.  Glucose is 97. BUN is 88. Creatinine 1.86, sodium 139. Potassium is 4.0. Chloride is 110. Bicarbonate is 23. Calcium is 7.6.   TIME SPENT: 40 minutes.    ____________________________ Hart Rochester Posey Pronto, MD sap:np D: 02/16/2013 14:24:22 ET T: 02/16/2013 16:19:42 ET JOB#: [id number]  cc: Mason Burleigh A. Posey Pronto, MD, <Dictator> Ilda Basset MD ELECTRONICALLY SIGNED 02/21/2013 12:17

## 2014-10-04 NOTE — Discharge Summary (Signed)
PATIENT NAME:  Misty Berry, SCREEN MR#:  [id number] DATE OF BIRTH:  [birth year]  DATE OF ADMISSION:  04/19/2013 DATE OF DISCHARGE:  04/23/2013  PRIMARY CARE PHYSICIAN:  Shirley Friar.  DISCHARGE DIAGNOSES:  Bilateral leg cellulitis, sepsis, acute renal failure on chronic kidney disease stage 3, hypertension, anemia.   CONDITION: Stable.   CODE STATUS is full code.   HOME MEDICATIONS: Please refer to the [facility] physician discharge instruction medication reconciliation list. The patient will continue outpatient home medication. New medication clindamycin 300 mg p.o. q.6 hours for 5 days.   The patient will be receiving home health and physical therapy.   ACTIVITY: As tolerated.   FOLLOWUP CARE: Follow up with PCP within 1 to 2 weeks.   REASON FOR ADMISSION: "I got my cellulitis back again."   HOSPITAL COURSE: The patient is a 80 year old Caucasian female with a history of frequent cellulitis, presented to the ED with leg redness, tenderness and swelling for 2 days and also the patient was very weak. The patient's temperature was 101.6, was noted to have lateral leg cellulitis. For detailed history and physical examination, please refer to the admission note dictated by Dr. Leslye Peer. Laboratory data on admission date showed WBC 16.3, hemoglobin 10.9. BUN 36, creatinine 2.14. Ultrasound of bilateral lower extremities negative DVT.  1. Sepsis with bilateral leg cellulitis. After admission, the patient has been treated with clindamycin IVPB. White count decreased to normal range. Blood culture is negative. The patient's bilateral cellulitis has been much improved, no more erythema and tenderness, only has mild swelling.  2.  Hypertension has been controlled with Norvasc. 3.  Acute renal failure on CKD, likely due to prerenal. The patient has been treated with IV fluid support. Renal function is improving. BUN decreased to 31, creatinine decreased to 1.78, near her baseline.   The patient has no  complaints. Vital signs are stable. She is clinically stable and will be discharged to home with home health and PT today. I discussed the patient's discharge plan with the patient, nurse and case manager.   TIME SPENT: About 38 minutes.   ____________________________ Demetrios Loll, MD qc:cs D: 04/23/2013 15:22:02 ET T: 04/23/2013 18:30:09 ET JOB#: [id number]  cc: Demetrios Loll, MD, <Dictator> Demetrios Loll MD ELECTRONICALLY SIGNED 04/24/2013 13:32

## 2014-11-04 ENCOUNTER — Ambulatory Visit (INDEPENDENT_AMBULATORY_CARE_PROVIDER_SITE_OTHER): Payer: Medicare Other | Admitting: Podiatry

## 2014-11-04 DIAGNOSIS — M79676 Pain in unspecified toe(s): Secondary | ICD-10-CM | POA: Diagnosis not present

## 2014-11-04 DIAGNOSIS — B351 Tinea unguium: Secondary | ICD-10-CM

## 2014-11-04 NOTE — Progress Notes (Signed)
She presents today with chief complaint of painful elongated toenails.  Objective: Vital signs are stable alert and oriented 3. Pulses are palpable. Nails are thick yellow dystrophic with mycotic.  Assessment: Pain in limb secondary to onychomycosis 1 through 5 bilateral.  Plan: Debridement of nails 1 through 5 bilateral.

## 2014-11-14 ENCOUNTER — Other Ambulatory Visit: Payer: Self-pay | Admitting: Physical Medicine and Rehabilitation

## 2014-11-14 DIAGNOSIS — M62830 Muscle spasm of back: Secondary | ICD-10-CM | POA: Insufficient documentation

## 2014-11-14 DIAGNOSIS — M5116 Intervertebral disc disorders with radiculopathy, lumbar region: Secondary | ICD-10-CM | POA: Insufficient documentation

## 2014-11-14 DIAGNOSIS — M5416 Radiculopathy, lumbar region: Secondary | ICD-10-CM

## 2014-11-14 DIAGNOSIS — M5136 Other intervertebral disc degeneration, lumbar region: Secondary | ICD-10-CM | POA: Insufficient documentation

## 2014-11-16 ENCOUNTER — Ambulatory Visit
Admission: RE | Admit: 2014-11-16 | Discharge: 2014-11-16 | Disposition: A | Discharge status: Home / Self Care | Payer: Medicare Other | Source: Ambulatory Visit | Attending: Physical Medicine and Rehabilitation | Admitting: Physical Medicine and Rehabilitation

## 2014-11-16 DIAGNOSIS — M545 Low back pain: Secondary | ICD-10-CM | POA: Insufficient documentation

## 2014-11-16 DIAGNOSIS — G8929 Other chronic pain: Secondary | ICD-10-CM | POA: Diagnosis not present

## 2014-11-16 DIAGNOSIS — N281 Cyst of kidney, acquired: Secondary | ICD-10-CM | POA: Diagnosis not present

## 2014-11-16 DIAGNOSIS — M2578 Osteophyte, vertebrae: Secondary | ICD-10-CM | POA: Diagnosis not present

## 2014-11-16 DIAGNOSIS — M5416 Radiculopathy, lumbar region: Secondary | ICD-10-CM | POA: Diagnosis present

## 2014-11-16 DIAGNOSIS — R262 Difficulty in walking, not elsewhere classified: Secondary | ICD-10-CM | POA: Diagnosis not present

## 2014-12-12 DIAGNOSIS — M47816 Spondylosis without myelopathy or radiculopathy, lumbar region: Secondary | ICD-10-CM | POA: Insufficient documentation

## 2015-01-19 ENCOUNTER — Emergency Department: Payer: Medicare Other

## 2015-01-19 ENCOUNTER — Encounter: Payer: Self-pay | Admitting: Emergency Medicine

## 2015-01-19 ENCOUNTER — Emergency Department
Admission: EM | Admit: 2015-01-19 | Discharge: 2015-01-19 | Disposition: A | Discharge status: Home / Self Care | Payer: Medicare Other | Attending: Emergency Medicine | Admitting: Emergency Medicine

## 2015-01-19 DIAGNOSIS — N183 Chronic kidney disease, stage 3 (moderate): Secondary | ICD-10-CM | POA: Diagnosis not present

## 2015-01-19 DIAGNOSIS — N3000 Acute cystitis without hematuria: Secondary | ICD-10-CM | POA: Diagnosis not present

## 2015-01-19 DIAGNOSIS — R102 Pelvic and perineal pain: Secondary | ICD-10-CM

## 2015-01-19 DIAGNOSIS — I129 Hypertensive chronic kidney disease with stage 1 through stage 4 chronic kidney disease, or unspecified chronic kidney disease: Secondary | ICD-10-CM | POA: Insufficient documentation

## 2015-01-19 DIAGNOSIS — R1030 Lower abdominal pain, unspecified: Secondary | ICD-10-CM | POA: Diagnosis present

## 2015-01-19 HISTORY — DX: Disorder of kidney and ureter, unspecified: N28.9

## 2015-01-19 HISTORY — DX: Other intervertebral disc degeneration, lumbar region without mention of lumbar back pain or lower extremity pain: M51.369

## 2015-01-19 HISTORY — DX: Other intervertebral disc degeneration, lumbar region: M51.36

## 2015-01-19 LAB — URINALYSIS COMPLETE WITH MICROSCOPIC (ARMC ONLY)
Bilirubin Urine: NEGATIVE
Glucose, UA: NEGATIVE mg/dL
HGB URINE DIPSTICK: NEGATIVE
Ketones, ur: NEGATIVE mg/dL
Leukocytes, UA: NEGATIVE
NITRITE: NEGATIVE
PH: 5 (ref 5.0–8.0)
Protein, ur: NEGATIVE mg/dL
SPECIFIC GRAVITY, URINE: 1.009 (ref 1.005–1.030)
SQUAMOUS EPITHELIAL / LPF: NONE SEEN

## 2015-01-19 LAB — COMPREHENSIVE METABOLIC PANEL
ALT: 16 U/L (ref 14–54)
AST: 22 U/L (ref 15–41)
Albumin: 3.3 g/dL — ABNORMAL LOW (ref 3.5–5.0)
Alkaline Phosphatase: 59 U/L (ref 38–126)
Anion gap: 8 (ref 5–15)
BILIRUBIN TOTAL: 0.6 mg/dL (ref 0.3–1.2)
BUN: 52 mg/dL — ABNORMAL HIGH (ref 6–20)
CALCIUM: 8.9 mg/dL (ref 8.9–10.3)
CHLORIDE: 108 mmol/L (ref 101–111)
CO2: 24 mmol/L (ref 22–32)
Creatinine, Ser: 2.55 mg/dL — ABNORMAL HIGH (ref 0.44–1.00)
GFR calc Af Amer: 20 mL/min — ABNORMAL LOW (ref >60)
GFR calc non Af Amer: 17 mL/min — ABNORMAL LOW (ref >60)
Glucose, Bld: 80 mg/dL (ref 65–99)
Potassium: 4.3 mmol/L (ref 3.5–5.1)
Sodium: 140 mmol/L (ref 135–145)
TOTAL PROTEIN: 6.9 g/dL (ref 6.5–8.1)

## 2015-01-19 LAB — CBC WITH DIFFERENTIAL/PLATELET
Basophils Absolute: 0 10*3/uL (ref 0–0.1)
Basophils Relative: 0 %
Eosinophils Absolute: 0.2 10*3/uL (ref 0–0.7)
Eosinophils Relative: 3 %
HCT: 35.7 % (ref 35.0–47.0)
Hemoglobin: 12.1 g/dL (ref 12.0–16.0)
LYMPHS PCT: 18 %
Lymphs Abs: 1.4 10*3/uL (ref 1.0–3.6)
MCH: 31 pg (ref 26.0–34.0)
MCHC: 34 g/dL (ref 32.0–36.0)
MCV: 91.2 fL (ref 80.0–100.0)
Monocytes Absolute: 0.4 10*3/uL (ref 0.2–0.9)
Monocytes Relative: 6 %
NEUTROS ABS: 5.4 10*3/uL (ref 1.4–6.5)
Neutrophils Relative %: 73 %
Platelets: 155 10*3/uL (ref 150–440)
RBC: 3.92 MIL/uL (ref 3.80–5.20)
RDW: 15.5 % — ABNORMAL HIGH (ref 11.5–14.5)
WBC: 7.4 10*3/uL (ref 3.6–11.0)

## 2015-01-19 LAB — LIPASE, BLOOD: LIPASE: 21 U/L — AB (ref 22–51)

## 2015-01-19 MED ORDER — MORPHINE SULFATE 4 MG/ML IJ SOLN
4.0000 mg | Freq: Once | INTRAMUSCULAR | Status: AC
  Administered 2015-01-19: 4 mg via INTRAVENOUS
  Filled 2015-01-19: qty 1

## 2015-01-19 MED ORDER — HYDROCODONE-ACETAMINOPHEN 5-325 MG PO TABS
1.0000 | ORAL_TABLET | Freq: Four times a day (QID) | ORAL | Status: DC | PRN
Start: 2015-01-19 — End: 2016-09-29

## 2015-01-19 MED ORDER — SODIUM CHLORIDE 0.9 % IV SOLN
Freq: Once | INTRAVENOUS | Status: AC
  Administered 2015-01-19: 15:00:00 via INTRAVENOUS

## 2015-01-19 MED ORDER — ONDANSETRON HCL 4 MG/2ML IJ SOLN
4.0000 mg | Freq: Once | INTRAMUSCULAR | Status: AC
  Administered 2015-01-19: 4 mg via INTRAVENOUS
  Filled 2015-01-19: qty 2

## 2015-01-19 MED ORDER — CEPHALEXIN 500 MG PO CAPS: 500.0000 mg | ORAL_CAPSULE | Freq: Three times a day (TID) | ORAL | Status: DC

## 2015-01-19 NOTE — ED Provider Notes (Signed)
Urinalysis suggestive of possible mild urinary tract infection. Absent any other isolation for the symptoms we'll treat this with Keflex. Vital signs are unremarkable this time. Patient is stable for discharge home.  Carrie Mew, MD 01/19/15 [phone]

## 2015-01-19 NOTE — Discharge Instructions (Signed)
Abdominal Pain, Women Abdominal (stomach, pelvic, or belly) pain can be caused by many things. It is important to tell your doctor:  The location of the pain.  Does it come and go or is it present all the time?  Are there things that start the pain (eating certain foods, exercise)?  Are there other symptoms associated with the pain (fever, nausea, vomiting, diarrhea)? All of this is helpful to know when trying to find the cause of the pain. CAUSES   Stomach: virus or bacteria infection, or ulcer.  Intestine: appendicitis (inflamed appendix), regional ileitis (Crohn's disease), ulcerative colitis (inflamed colon), irritable bowel syndrome, diverticulitis (inflamed diverticulum of the colon), or cancer of the stomach or intestine.  Gallbladder disease or stones in the gallbladder.  Kidney disease, kidney stones, or infection.  Pancreas infection or cancer.  Fibromyalgia (pain disorder).  Diseases of the female organs:  Uterus: fibroid (non-cancerous) tumors or infection.  Fallopian tubes: infection or tubal pregnancy.  Ovary: cysts or tumors.  Pelvic adhesions (scar tissue).  Endometriosis (uterus lining tissue growing in the pelvis and on the pelvic organs).  Pelvic congestion syndrome (female organs filling up with blood just before the menstrual period).  Pain with the menstrual period.  Pain with ovulation (producing an egg).  Pain with an IUD (intrauterine device, birth control) in the uterus.  Cancer of the female organs.  Functional pain (pain not caused by a disease, may improve without treatment).  Psychological pain.  Depression. DIAGNOSIS  Your doctor will decide the seriousness of your pain by doing an examination.  Blood tests.  X-rays.  Ultrasound.  CT scan (computed tomography, special type of X-ray).  MRI (magnetic resonance imaging).  Cultures, for infection.  Barium enema (dye inserted in the large intestine, to better view it with  X-rays).  Colonoscopy (looking in intestine with a lighted tube).  Laparoscopy (minor surgery, looking in abdomen with a lighted tube).  Major abdominal exploratory surgery (looking in abdomen with a large incision). TREATMENT  The treatment will depend on the cause of the pain.   Many cases can be observed and treated at home.  Over-the-counter medicines recommended by your caregiver.  Prescription medicine.  Antibiotics, for infection.  Birth control pills, for painful periods or for ovulation pain.  Hormone treatment, for endometriosis.  Nerve blocking injections.  Physical therapy.  Antidepressants.  Counseling with a psychologist or psychiatrist.  Minor or major surgery. HOME CARE INSTRUCTIONS   Do not take laxatives, unless directed by your caregiver.  Take over-the-counter pain medicine only if ordered by your caregiver. Do not take aspirin because it can cause an upset stomach or bleeding.  Try a clear liquid diet (broth or water) as ordered by your caregiver. Slowly move to a bland diet, as tolerated, if the pain is related to the stomach or intestine.  Have a thermometer and take your temperature several times a day, and record it.  Bed rest and sleep, if it helps the pain.  Avoid sexual intercourse, if it causes pain.  Avoid stressful situations.  Keep your follow-up appointments and tests, as your caregiver orders.  If the pain does not go away with medicine or surgery, you may try:  Acupuncture.  Relaxation exercises (yoga, meditation).  Group therapy.  Counseling. SEEK MEDICAL CARE IF:   You notice certain foods cause stomach pain.  Your home care treatment is not helping your pain.  You need stronger pain medicine.  You want your IUD removed.  You feel faint or  lightheaded.  You develop nausea and vomiting.  You develop a rash.  You are having side effects or an allergy to your medicine. SEEK IMMEDIATE MEDICAL CARE IF:   Your  pain does not go away or gets worse.  You have a fever.  Your pain is felt only in portions of the abdomen. The right side could possibly be appendicitis. The left lower portion of the abdomen could be colitis or diverticulitis.  You are passing blood in your stools (bright red or black tarry stools, with or without vomiting).  You have blood in your urine.  You develop chills, with or without a fever.  You pass out. MAKE SURE YOU:   Understand these instructions.  Will watch your condition.  Will get help right away if you are not doing well or get worse. Document Released: 03/28/2007 Document Revised: 10/15/2013 Document Reviewed: 04/17/2009 [facility] Patient Information 2015 [place], [place]. This information is not intended to replace advice given to you by your health care provider. Make sure you discuss any questions you have with your health care provider.  Urinary Tract Infection Urinary tract infections (UTIs) can develop anywhere along your urinary tract. Your urinary tract is your body's drainage system for removing wastes and extra water. Your urinary tract includes two kidneys, two ureters, a bladder, and a urethra. Your kidneys are a pair of bean-shaped organs. Each kidney is about the size of your fist. They are located below your ribs, one on each side of your spine. CAUSES Infections are caused by microbes, which are microscopic organisms, including fungi, viruses, and bacteria. These organisms are so small that they can only be seen through a microscope. Bacteria are the microbes that most commonly cause UTIs. SYMPTOMS  Symptoms of UTIs may vary by age and gender of the patient and by the location of the infection. Symptoms in young women typically include a frequent and intense urge to urinate and a painful, burning feeling in the bladder or urethra during urination. Older women and men are more likely to be tired, shaky, and weak and have muscle aches and abdominal pain.  A fever may mean the infection is in your kidneys. Other symptoms of a kidney infection include pain in your back or sides below the ribs, nausea, and vomiting. DIAGNOSIS To diagnose a UTI, your caregiver will ask you about your symptoms. Your caregiver also will ask to provide a urine sample. The urine sample will be tested for bacteria and white blood cells. White blood cells are made by your body to help fight infection. TREATMENT  Typically, UTIs can be treated with medication. Because most UTIs are caused by a bacterial infection, they usually can be treated with the use of antibiotics. The choice of antibiotic and length of treatment depend on your symptoms and the type of bacteria causing your infection. HOME CARE INSTRUCTIONS  If you were prescribed antibiotics, take them exactly as your caregiver instructs you. Finish the medication even if you feel better after you have only taken some of the medication.  Drink enough water and fluids to keep your urine clear or pale yellow.  Avoid caffeine, tea, and carbonated beverages. They tend to irritate your bladder.  Empty your bladder often. Avoid holding urine for long periods of time.  Empty your bladder before and after sexual intercourse.  After a bowel movement, women should cleanse from front to back. Use each tissue only once. SEEK MEDICAL CARE IF:   You have back pain.  You develop  a fever.  Your symptoms do not begin to resolve within 3 days. SEEK IMMEDIATE MEDICAL CARE IF:   You have severe back pain or lower abdominal pain.  You develop chills.  You have nausea or vomiting.  You have continued burning or discomfort with urination. MAKE SURE YOU:   Understand these instructions.  Will watch your condition.  Will get help right away if you are not doing well or get worse. Document Released: 03/10/2005 Document Revised: 11/30/2011 Document Reviewed: 07/09/2011 [facility] Patient Information 2015 [place], [place]. This  information is not intended to replace advice given to you by your health care provider. Make sure you discuss any questions you have with your health care provider.

## 2015-01-19 NOTE — ED Provider Notes (Signed)
[facility] Emergency Department Provider Note     Time seen: ----------------------------------------- 2:37 PM on 01/19/2015 -----------------------------------------    I have reviewed the triage vital signs and the nursing notes.   HISTORY  Chief Complaint Groin Pain and Rectal Pain    HPI Misty Berry is a 80 y.o. female who presents ER for left-sided groin pain for last week. Patient reports difficulty walking. She is taking over-the-counter ibuprofen without any significant improvement. Patient states is a sharp pain that radiates from her groin down to her buttocks. Denies fevers chills other complaints, has not had a history of this before. Does have a history of chronic back problems and has had an MRI here recently.   Past Medical History  Diagnosis Date  . Osteoarthritis   . Osteoporosis   . Swelling   . HBP (high blood pressure)   . Asthma   . Cough   . Bruises easily   . Rash   . GERD (gastroesophageal reflux disease)   . Hyperparathyroidism   . DDD (degenerative disc disease), lumbar   . Renal disorder     kidney disease    Patient Active Problem List   Diagnosis Date Noted  . Asthma 06/20/2013  . Essential hypertension 06/20/2013  . Follow-up examination 04/30/2013  . Acute renal failure 02/16/2013  . Fall 02/16/2013  . Closed fracture of part of neck of femur 02/16/2013  . Cellulitis and abscess of leg 10/30/2012  . Chronic kidney disease, stage III (moderate) 10/30/2012    Past Surgical History  Procedure Laterality Date  . Foot surgery Right   . Thyroid surgery    . Eye surgery Left   . Vitrectomy    . Breast reduction surgery    . Lipectomy    . Femur surgery    . Parotid gland tumor excision    . Gallbladder surgery    . Appendectomy    . Tubal ligation    . Tonsillectomy    . Cholecystectomy      Allergies Ivp dye; Methylprednisolone; Peanuts; and Vancomycin  Social History History  Substance Use  Topics  . Smoking status: Never Smoker   . Smokeless tobacco: Never Used  . Alcohol Use: No    Review of Systems Constitutional: Negative for fever. Eyes: Negative for visual changes. ENT: Negative for sore throat. Cardiovascular: Negative for chest pain. Respiratory: Negative for shortness of breath. Gastrointestinal: Positive for left-sided groin pain Genitourinary: Negative for dysuria. Musculoskeletal: Positive for chronic back pain Skin: Negative for rash. Neurological: Negative for headaches, focal weakness or numbness.  10-point ROS otherwise negative.  ____________________________________________   PHYSICAL EXAM:  VITAL SIGNS: ED Triage Vitals  Enc Vitals Group     BP 01/19/15 1130 154/79 mmHg     Pulse Rate 01/19/15 1130 56     Resp 01/19/15 1130 20     Temp 01/19/15 1130 97.6 F (36.4 C)     Temp Source 01/19/15 1130 Oral     SpO2 01/19/15 1130 99 %     Weight 01/19/15 1130 215 lb (97.523 kg)     Height 01/19/15 1130 5\' 1"  (1.549 m)     Head Cir --      Peak Flow --      Pain Score 01/19/15 1130 0     Pain Loc --      Pain Edu? --      Excl. in [place]? --     Constitutional: Alert and oriented. Well appearing and  in no distress. Eyes: Conjunctivae are normal. PERRL. Normal extraocular movements. ENT   Head: Normocephalic and atraumatic.   Nose: No congestion/rhinnorhea.   Mouth/Throat: Mucous membranes are moist.   Neck: No stridor. Cardiovascular: Normal rate, regular rhythm. Normal and symmetric distal pulses are present in all extremities. No murmurs, rubs, or gallops. Respiratory: Normal respiratory effort without tachypnea nor retractions. Breath sounds are clear and equal bilaterally. No wheezes/rales/rhonchi. Gastrointestinal: Soft and nontender. No distention. No abdominal bruits.  Musculoskeletal: Nontender with limited range of motion in the lower extremities. No joint effusions.  No lower extremity tenderness nor edema. Neurologic:   Normal speech and language. No gross focal neurologic deficits are appreciated. Speech is normal. No gait instability. Skin:  Skin is warm, dry and intact. No rash noted. Psychiatric: Mood and affect are normal. Speech and behavior are normal. Patient exhibits appropriate insight and judgment. ____________________________________________  ED COURSE:  Pertinent labs & imaging results that were available during my care of the patient were reviewed by me and considered in my medical decision making (see chart for details). Patient will need abdominal labs, urine and likely imaging. ____________________________________________    LABS (pertinent positives/negatives)  Labs Reviewed  CBC WITH DIFFERENTIAL/PLATELET - Abnormal; Notable for the following:    RDW 15.5 (*)    All other components within normal limits  COMPREHENSIVE METABOLIC PANEL - Abnormal; Notable for the following:    BUN 52 (*)    Creatinine, Ser 2.55 (*)    Albumin 3.3 (*)    GFR calc non Af Amer 17 (*)    GFR calc Af Amer 20 (*)    All other components within normal limits  LIPASE, BLOOD - Abnormal; Notable for the following:    Lipase 21 (*)    All other components within normal limits  URINALYSIS COMPLETEWITH MICROSCOPIC ([facility] ONLY)   IMPRESSION: Significant diverticulosis throughout the colon without evidence of diverticulitis. Most severe involving sigmoid colon with evidence of spasm. Constipation.  Significant hiatal hernia.  Significant renal atrophy. Renal lesions likely cysts.   FINAL ASSESSMENT AND PLAN  Flank pain  Plan: Patient with labs and imaging as dictated above. Unclear etiology of symptoms. She does have diverticulosis with spasm on CT.    Earleen Newport, MD   Earleen Newport, MD 01/19/15 [phone]

## 2015-01-19 NOTE — ED Notes (Signed)
Spoke with Dr Jimmye Norman about pt's condition, no new orders at this time.

## 2015-01-19 NOTE — ED Notes (Signed)
AAOx3.  Skin warm and dry.  NAD 

## 2015-01-19 NOTE — ED Notes (Signed)
Pt reports pain to left groin x1 week now, reports difficulty walking now. Reports taking ibuprofen with no relief. Pt reports pain travels from groin into rectum.

## 2015-01-19 NOTE — ED Notes (Signed)
Ambulated to BR with minimal assist.  Patient c/o sever left groin pain with ambulation.

## 2015-01-20 LAB — URINE CULTURE

## 2015-01-22 DIAGNOSIS — M7600 Gluteal tendinitis, unspecified hip: Secondary | ICD-10-CM | POA: Insufficient documentation

## 2015-02-10 ENCOUNTER — Ambulatory Visit: Payer: Medicare Other

## 2015-02-28 DIAGNOSIS — I872 Venous insufficiency (chronic) (peripheral): Secondary | ICD-10-CM | POA: Insufficient documentation

## 2015-04-30 DIAGNOSIS — S51819A Laceration without foreign body of unspecified forearm, initial encounter: Secondary | ICD-10-CM | POA: Insufficient documentation

## 2015-04-30 DIAGNOSIS — N184 Chronic kidney disease, stage 4 (severe): Secondary | ICD-10-CM

## 2015-04-30 DIAGNOSIS — I1310 Hypertensive heart and chronic kidney disease without heart failure, with stage 1 through stage 4 chronic kidney disease, or unspecified chronic kidney disease: Secondary | ICD-10-CM | POA: Insufficient documentation

## 2015-04-30 DIAGNOSIS — I130 Hypertensive heart and chronic kidney disease with heart failure and stage 1 through stage 4 chronic kidney disease, or unspecified chronic kidney disease: Secondary | ICD-10-CM | POA: Insufficient documentation

## 2015-05-28 ENCOUNTER — Ambulatory Visit (INDEPENDENT_AMBULATORY_CARE_PROVIDER_SITE_OTHER): Payer: Medicare Other | Admitting: Podiatry

## 2015-05-28 ENCOUNTER — Encounter: Payer: Self-pay | Admitting: Podiatry

## 2015-05-28 DIAGNOSIS — M79676 Pain in unspecified toe(s): Secondary | ICD-10-CM

## 2015-05-28 DIAGNOSIS — B351 Tinea unguium: Secondary | ICD-10-CM

## 2015-05-28 NOTE — Progress Notes (Signed)
She presents today with chief complaint of painful elongated toenails bilateral.  Objective: Pulses are palpable bilateral. Toenails are thick yellow dystrophic with mycotic and painful palpation.  Assessment: Pain in limb secondary to onychomycosis 1 through 5 bilateral.  Plan: Debridement of nails 1 through 5 bilateral covered service secondary to pain. Follow up with her in 3 months.

## 2015-07-31 DIAGNOSIS — Z9181 History of falling: Secondary | ICD-10-CM | POA: Insufficient documentation

## 2015-07-31 DIAGNOSIS — Z970 Presence of artificial eye: Secondary | ICD-10-CM | POA: Insufficient documentation

## 2015-08-27 ENCOUNTER — Ambulatory Visit (INDEPENDENT_AMBULATORY_CARE_PROVIDER_SITE_OTHER): Payer: Medicare Other | Admitting: Podiatry

## 2015-08-27 ENCOUNTER — Encounter: Payer: Self-pay | Admitting: Podiatry

## 2015-08-27 DIAGNOSIS — R233 Spontaneous ecchymoses: Secondary | ICD-10-CM | POA: Insufficient documentation

## 2015-08-27 DIAGNOSIS — R238 Other skin changes: Secondary | ICD-10-CM | POA: Insufficient documentation

## 2015-08-27 DIAGNOSIS — M79676 Pain in unspecified toe(s): Secondary | ICD-10-CM | POA: Diagnosis not present

## 2015-08-27 DIAGNOSIS — L0390 Cellulitis, unspecified: Secondary | ICD-10-CM | POA: Insufficient documentation

## 2015-08-27 DIAGNOSIS — B351 Tinea unguium: Secondary | ICD-10-CM

## 2015-08-27 DIAGNOSIS — M1990 Unspecified osteoarthritis, unspecified site: Secondary | ICD-10-CM | POA: Insufficient documentation

## 2015-08-27 DIAGNOSIS — Z970 Presence of artificial eye: Secondary | ICD-10-CM | POA: Insufficient documentation

## 2015-08-27 DIAGNOSIS — K219 Gastro-esophageal reflux disease without esophagitis: Secondary | ICD-10-CM | POA: Insufficient documentation

## 2015-08-27 DIAGNOSIS — Z9889 Other specified postprocedural states: Secondary | ICD-10-CM | POA: Insufficient documentation

## 2015-08-27 DIAGNOSIS — M810 Age-related osteoporosis without current pathological fracture: Secondary | ICD-10-CM | POA: Insufficient documentation

## 2015-08-27 NOTE — Progress Notes (Signed)
She presents today with a chief complaint of painful elongated nails.  Objective: Toenails are thick yellow dystrophic with mycotic and painful palpation. Pulses are palpable.  Assessment: Pain in limb secondary to onychomycosis 1 through 5 bilateral.  Plan: Debridement of toenails 1 through 5 bilateral. Follow up with her in 3 months.

## 2015-11-28 DIAGNOSIS — M25562 Pain in left knee: Secondary | ICD-10-CM | POA: Insufficient documentation

## 2015-11-28 DIAGNOSIS — R32 Unspecified urinary incontinence: Secondary | ICD-10-CM | POA: Insufficient documentation

## 2015-12-10 ENCOUNTER — Ambulatory Visit (INDEPENDENT_AMBULATORY_CARE_PROVIDER_SITE_OTHER): Payer: Medicare Other | Admitting: Podiatry

## 2015-12-10 ENCOUNTER — Encounter: Payer: Self-pay | Admitting: Podiatry

## 2015-12-10 DIAGNOSIS — M79676 Pain in unspecified toe(s): Secondary | ICD-10-CM | POA: Diagnosis not present

## 2015-12-10 DIAGNOSIS — B351 Tinea unguium: Secondary | ICD-10-CM | POA: Diagnosis not present

## 2015-12-10 NOTE — Progress Notes (Signed)
She presents today with chief complaint of painful elongated toenails 1 through 5 bilateral.  Objective: Vital signs stable alert and oriented 3. Pulses are palpable. Neurologic sensorium is intact. Toenails are thick yellow dystrophic, mycotic and painful palpation.  Assessment: Pain in limb secondary to onychomycosis 1 through 5 bilateral.  Plan: Debridement of toenails 1 through 5 bilateral cover service secondary to pain.

## 2016-03-17 ENCOUNTER — Ambulatory Visit (INDEPENDENT_AMBULATORY_CARE_PROVIDER_SITE_OTHER): Payer: Medicare Other | Admitting: Podiatry

## 2016-03-17 ENCOUNTER — Encounter: Payer: Self-pay | Admitting: Podiatry

## 2016-03-17 DIAGNOSIS — B351 Tinea unguium: Secondary | ICD-10-CM | POA: Diagnosis not present

## 2016-03-17 DIAGNOSIS — M79676 Pain in unspecified toe(s): Secondary | ICD-10-CM

## 2016-03-17 DIAGNOSIS — Q828 Other specified congenital malformations of skin: Secondary | ICD-10-CM

## 2016-03-18 NOTE — Progress Notes (Signed)
She presents today with a chief complaint of painful toenails and calluses to the distal aspects of the toes bilaterally. She states that she has no open wounds or drainage.  Objective: Vital signs are stable she is alert and oriented 3 pulses are palpable. Neurologic sensorium is intact. Deep tendon reflexes are intact bilateral. Muscle strength was 5 over 5. Her toe deformities are noted bilateral. Toenails are thick yellow dystrophic onychomycotic distal clavi or keratomas noted to the distal aspects of the toes in the forefoot bilateral.  Assessment: Diabetes mellitus no open lesions or wounds. Multiple clavi for keratomas. Pain in limb secondary to onychomycosis.  Plan: Debridement of all reactive hyperkeratotic tissue no bleeding is noted. No iatrogenic lesions. And debrided nails 1 through 5 bilateral. Follow-up with in 3 months

## 2016-06-09 DIAGNOSIS — M159 Polyosteoarthritis, unspecified: Secondary | ICD-10-CM | POA: Insufficient documentation

## 2016-06-21 ENCOUNTER — Ambulatory Visit: Payer: Medicare Other | Admitting: Podiatry

## 2016-06-28 ENCOUNTER — Encounter: Payer: Self-pay | Admitting: Podiatry

## 2016-06-28 ENCOUNTER — Ambulatory Visit (INDEPENDENT_AMBULATORY_CARE_PROVIDER_SITE_OTHER): Payer: Medicare Other | Admitting: Podiatry

## 2016-06-28 DIAGNOSIS — B351 Tinea unguium: Secondary | ICD-10-CM | POA: Diagnosis not present

## 2016-06-28 DIAGNOSIS — Q828 Other specified congenital malformations of skin: Secondary | ICD-10-CM

## 2016-06-28 DIAGNOSIS — M79676 Pain in unspecified toe(s): Secondary | ICD-10-CM | POA: Diagnosis not present

## 2016-06-29 NOTE — Progress Notes (Signed)
She presents today chief complaint of painful elongated toenails and calluses to the ends of her toes.  Objective: Vital signs are stable she is alert and oriented 3. Pulses are palpable. Capillary fill times immediate reactive hyperkeratotic lesion distal aspect of the toe second digit left. To be worse was debrided demonstrates very superficial wound which should go on to heal quite nicely no malodor neck. Once does not probe deep at all. Toenails are thick yellow dystrophic onychomycotic.  Assessment: Hammertoe deformity resulting in distal clavi and painful porokeratotic lesions. She also has pain in limb secondary to onychomycosis.  Plan: Debrided all reactive hyperkeratotic lesions debrided toenails 1 through 5 bilateral. Follow up with her in 2-3 months. She will closely watch the second toe of her left foot should become more red and more painful she will notify us immediately.

## 2016-07-05 ENCOUNTER — Ambulatory Visit: Payer: Medicare Other | Admitting: Podiatry

## 2016-08-09 ENCOUNTER — Ambulatory Visit (INDEPENDENT_AMBULATORY_CARE_PROVIDER_SITE_OTHER): Payer: Medicare Other | Admitting: Podiatry

## 2016-08-09 DIAGNOSIS — Q828 Other specified congenital malformations of skin: Secondary | ICD-10-CM | POA: Diagnosis not present

## 2016-08-09 NOTE — Progress Notes (Signed)
She presents today chief complaint of painful calluses bilateral foot.  Objective: Vital signs are stable she's alert and oriented 3 this will clavus to the lesser digits 2 and 3 bilateral foot with distal clavus to the second digit being on the left foot being the left worse. Was debrided does not demonstrate any open wounds or lesions.  Assessment: Distal clavi with hammertoe deformities bilateral.  Plan: Debridement of reactive hyperkeratosis.

## 2016-09-29 ENCOUNTER — Encounter: Payer: Self-pay | Admitting: Podiatry

## 2016-09-29 ENCOUNTER — Ambulatory Visit (INDEPENDENT_AMBULATORY_CARE_PROVIDER_SITE_OTHER): Payer: Medicare Other | Admitting: Podiatry

## 2016-09-29 DIAGNOSIS — L97501 Non-pressure chronic ulcer of other part of unspecified foot limited to breakdown of skin: Secondary | ICD-10-CM | POA: Diagnosis not present

## 2016-09-29 DIAGNOSIS — Q828 Other specified congenital malformations of skin: Secondary | ICD-10-CM

## 2016-09-29 DIAGNOSIS — B351 Tinea unguium: Secondary | ICD-10-CM

## 2016-09-29 DIAGNOSIS — M79676 Pain in unspecified toe(s): Secondary | ICD-10-CM

## 2016-09-29 NOTE — Progress Notes (Signed)
She presents today for follow-up of nail trim and ulcers to the distal aspect of the toes bilateral. She states they are some better with a continued bleeding on occasion. She dresses them daily.  Objective: Vital signs stable. Alert and oriented 3 toenails are long thick L dystrophic with mycotic I also see reactive hyperkeratosis distal aspect second toes bilateral resulting in superficial ulcerations or signs of cellulitis. Was no drainage or malodor.  Assessment: Diabetic ulcerations distal aspect of the toes. Pain in limb see no elongated toenails.  Plan: Debrided all hyperkeratotic tissue and ulcerative lesions. We placed her into a [employer] shoes. Debrider nails. Follow up with her in 2 weeks

## 2016-10-17 ENCOUNTER — Telehealth: Payer: Self-pay | Admitting: Podiatry

## 2016-10-17 NOTE — Telephone Encounter (Signed)
Patient called on Saturday 10/16/16.  She says she was seen last week by Dr. Milinda Pointer for foot care.  She says her toe has continued to bleed and is bleeding more.  She asks if she can apply alum to stop the bleeding.  I told her that would be fine but recommended she apply pressure to the bleeding site to promote clotting.  Also ice and elevation would be appropriate.       Gardiner Barefoot DPM

## 2016-10-20 ENCOUNTER — Ambulatory Visit (INDEPENDENT_AMBULATORY_CARE_PROVIDER_SITE_OTHER): Payer: Medicare Other | Admitting: Podiatry

## 2016-10-20 DIAGNOSIS — B351 Tinea unguium: Secondary | ICD-10-CM

## 2016-10-20 DIAGNOSIS — L97501 Non-pressure chronic ulcer of other part of unspecified foot limited to breakdown of skin: Secondary | ICD-10-CM

## 2016-10-20 DIAGNOSIS — M79676 Pain in unspecified toe(s): Secondary | ICD-10-CM | POA: Diagnosis not present

## 2016-10-20 NOTE — Progress Notes (Signed)
She presents today with chief complaint of painful elongated toenails and for follow-up of her preoperative lesions second toes bilateral. She states that she covers with a Band-Aid but has now developed some bleeding from where she tore her skin with a Band-Aid.  Objective: Vital signs are stable alert and oriented 3 pulses are still palpable. Hammertoe deformities rigid nature resulting in distal clavi which are pre-ulcerative in nature. She does have a small skin laceration due to what appears to be a tear but there is no skin breakdown or ulcerative change. These are going to heal uneventfully. Her toenails are long the yellow dystrophic clinic mycotic and painful palpation as well as debridement.  Assessment: Healing ulcerations with hammertoe deformities distal aspect second toes bilateral. Pain limb secondary to onychomycosis.  Plan: Debridement of toenails removed reactive hyperkeratosis all up with me in 3 weeks. Debrided the ulcerations to normal skin.

## 2016-11-15 ENCOUNTER — Ambulatory Visit (INDEPENDENT_AMBULATORY_CARE_PROVIDER_SITE_OTHER): Payer: Medicare Other | Admitting: Podiatry

## 2016-11-15 ENCOUNTER — Encounter: Payer: Self-pay | Admitting: Podiatry

## 2016-11-15 DIAGNOSIS — L97501 Non-pressure chronic ulcer of other part of unspecified foot limited to breakdown of skin: Secondary | ICD-10-CM | POA: Diagnosis not present

## 2016-11-15 NOTE — Progress Notes (Signed)
She presents today chief concern of ulceration to the second digits bilaterally.  Objective: Vital signs are stable she is alert and oriented 3 hammertoe deformities resulting and reactive hyperkeratosis plantar aspect of the aforementioned toes. Was debrided today does not demonstrate bleeding there is no periods and no malodor.  Assessment: Healing ulcerations digits.  Plan: Continue conservative therapies follow up with me in 1 month

## 2016-12-13 ENCOUNTER — Ambulatory Visit (INDEPENDENT_AMBULATORY_CARE_PROVIDER_SITE_OTHER): Payer: Medicare Other | Admitting: Podiatry

## 2016-12-13 ENCOUNTER — Encounter: Payer: Self-pay | Admitting: Podiatry

## 2016-12-13 DIAGNOSIS — L97501 Non-pressure chronic ulcer of other part of unspecified foot limited to breakdown of skin: Secondary | ICD-10-CM

## 2016-12-13 DIAGNOSIS — L98491 Non-pressure chronic ulcer of skin of other sites limited to breakdown of skin: Secondary | ICD-10-CM | POA: Diagnosis not present

## 2016-12-13 NOTE — Progress Notes (Signed)
She presents today for follow-up of ulcerations distal aspect of the second toes bilateral. She states that they're still bothering her.  Objective: Vital signs are stable she is alert and oriented 3. Pulses are palpable. Once removing the Band-Aids to the tip of the toe today which were very sore to remove there is some reactive hyperkeratosis that was debrided does not demonstrate ulceration.  Assessment: Hammertoe deformity with distal clavus that had ulcerated second is bilateral.  Plan: At this point placed her in buttress pads and debridement all reactive hyperkeratosis follow up with her in about a month.

## 2017-01-10 ENCOUNTER — Ambulatory Visit (INDEPENDENT_AMBULATORY_CARE_PROVIDER_SITE_OTHER): Payer: Medicare Other

## 2017-01-10 ENCOUNTER — Encounter: Payer: Self-pay | Admitting: Podiatry

## 2017-01-10 ENCOUNTER — Ambulatory Visit (INDEPENDENT_AMBULATORY_CARE_PROVIDER_SITE_OTHER): Payer: Medicare Other | Admitting: Podiatry

## 2017-01-10 DIAGNOSIS — B351 Tinea unguium: Secondary | ICD-10-CM

## 2017-01-10 DIAGNOSIS — L97501 Non-pressure chronic ulcer of other part of unspecified foot limited to breakdown of skin: Secondary | ICD-10-CM | POA: Diagnosis not present

## 2017-01-10 DIAGNOSIS — S9032XA Contusion of left foot, initial encounter: Secondary | ICD-10-CM

## 2017-01-10 DIAGNOSIS — M79676 Pain in unspecified toe(s): Secondary | ICD-10-CM | POA: Diagnosis not present

## 2017-01-10 NOTE — Progress Notes (Signed)
She presents today for chief complaint of painful elongated toenails and calluses to the second digits bilaterally. She states that she seems to be doing better. She is concerned of pain to the dorsal lateral aspect of the left foot. States that she may have broken her bone.  Objective: Vital signs are stable she is alert and oriented 3. Pulses are palpable. Chest tenderness overlying the fourth and fifth metatarsals of the left little palpation. Hammertoe deformities resulting in distal clavi bilaterally. Toenails are long thick yellow dystrophic clinical mycotic. Radius taken today do not demonstrate any type of osseus abnormality in the area in question.  Assessment: History of ulceration second toes bilaterally. Pain in limb segment onychomycosis. Contusion foot left. Follow-up with me in 1 month.

## 2017-02-07 ENCOUNTER — Encounter: Payer: Self-pay | Admitting: Podiatry

## 2017-02-07 ENCOUNTER — Ambulatory Visit (INDEPENDENT_AMBULATORY_CARE_PROVIDER_SITE_OTHER): Payer: Medicare Other | Admitting: Podiatry

## 2017-02-07 DIAGNOSIS — L97501 Non-pressure chronic ulcer of other part of unspecified foot limited to breakdown of skin: Secondary | ICD-10-CM

## 2017-02-07 NOTE — Progress Notes (Signed)
She presents today with a chief complaint of painful elongated toenails and ulcerations distal aspect of the toes bilaterally. States that she discontinue her cellulitis on the right leg.  Objective: Vital signs are stable no change in neurovascular status hammertoe deformities resulting in distal clavi and ultimately ulcerations which appear to be healing nicely. I see no signs of infection at this point.  Assessment: Pain limb secured onychomycosis porokeratosis. Resolving ulcerations.  Plan: Follow up with [place] on an as-needed basis. We did toenails and ulcerations today no signs of infection.

## 2017-04-11 ENCOUNTER — Encounter: Payer: Self-pay | Admitting: Podiatry

## 2017-04-11 ENCOUNTER — Ambulatory Visit (INDEPENDENT_AMBULATORY_CARE_PROVIDER_SITE_OTHER): Payer: Medicare Other | Admitting: Podiatry

## 2017-04-11 DIAGNOSIS — Q828 Other specified congenital malformations of skin: Secondary | ICD-10-CM | POA: Diagnosis not present

## 2017-04-11 DIAGNOSIS — B351 Tinea unguium: Secondary | ICD-10-CM | POA: Diagnosis not present

## 2017-04-11 DIAGNOSIS — M79676 Pain in unspecified toe(s): Secondary | ICD-10-CM | POA: Diagnosis not present

## 2017-04-11 NOTE — Progress Notes (Signed)
She presents today to complaint of painful elongated toenails and painful ulcerations.  Objective: Vital signs are stable she's alert and oriented 3 ulcerations plantar aspect bilateral foot and the tips of the toes downhill uneventfully reactive hyperkeratosis were debrided today with areas toenails are thick dystrophic onychomycotic also debrided those nails with painful tendencies.  Assessment: Pain limiting her onychomycosis porokeratosis open lesions have gone on to heal.  Plan: Debridement of toenails and reactive hyperkeratotic tissue placed buttress pads today continue to follow up with me in 2 months.

## 2017-05-11 ENCOUNTER — Ambulatory Visit: Payer: Medicare Other | Admitting: Podiatry

## 2017-06-13 ENCOUNTER — Ambulatory Visit: Payer: Medicare Other | Admitting: Podiatry

## 2017-06-15 ENCOUNTER — Ambulatory Visit: Payer: Medicare Other | Admitting: Podiatry

## 2017-06-27 ENCOUNTER — Encounter: Payer: Self-pay | Admitting: Podiatry

## 2017-06-27 ENCOUNTER — Ambulatory Visit (INDEPENDENT_AMBULATORY_CARE_PROVIDER_SITE_OTHER): Payer: Medicare Other | Admitting: Podiatry

## 2017-06-27 DIAGNOSIS — Q828 Other specified congenital malformations of skin: Secondary | ICD-10-CM

## 2017-06-27 DIAGNOSIS — M79676 Pain in unspecified toe(s): Secondary | ICD-10-CM

## 2017-06-27 DIAGNOSIS — B351 Tinea unguium: Secondary | ICD-10-CM

## 2017-06-27 DIAGNOSIS — L97521 Non-pressure chronic ulcer of other part of left foot limited to breakdown of skin: Secondary | ICD-10-CM | POA: Diagnosis not present

## 2017-06-27 NOTE — Progress Notes (Signed)
She presents today chief complaint of a painful area to the medial aspect second digit left foot.  States that she has not been wearing her toe or crest pads because she is lost them.  She states that her toes have been sore lately.  Objective: Vital signs are stable alert and oriented x3 toenails are long thick yellow dystrophic onychomycotic painful on palpation as well as debridement.  She has a superficial ulceration medial aspect of the second toe left which appears to be a blister that has ruptured.  There is no signs of infection to this area.  Assessment: Ulceration second digit left.  Distal clavi to the second digits bilaterally and pain in limb secondary to onychomycosis.  Plan: Debridement of toenails and ulcerative lesion.  Debridement of all reactive hyperkeratosis.  I did recommend she start soaking daily I also dispensed another set of crest pads.  She will dressed the toe on a daily basis follow-up with me in 1 month or sooner if necessary.

## 2017-09-26 ENCOUNTER — Ambulatory Visit: Payer: Medicare Other | Admitting: Podiatry

## 2017-10-03 DIAGNOSIS — E785 Hyperlipidemia, unspecified: Secondary | ICD-10-CM | POA: Insufficient documentation

## 2017-10-03 DIAGNOSIS — I471 Supraventricular tachycardia: Secondary | ICD-10-CM | POA: Insufficient documentation

## 2017-10-24 ENCOUNTER — Encounter: Payer: Self-pay | Admitting: Podiatry

## 2017-10-24 ENCOUNTER — Ambulatory Visit (INDEPENDENT_AMBULATORY_CARE_PROVIDER_SITE_OTHER): Payer: Medicare Other | Admitting: Podiatry

## 2017-10-24 DIAGNOSIS — M79676 Pain in unspecified toe(s): Secondary | ICD-10-CM | POA: Diagnosis not present

## 2017-10-24 DIAGNOSIS — B351 Tinea unguium: Secondary | ICD-10-CM | POA: Diagnosis not present

## 2017-10-24 DIAGNOSIS — Q828 Other specified congenital malformations of skin: Secondary | ICD-10-CM

## 2017-10-24 NOTE — Progress Notes (Signed)
She presents today chief complaint of painful elongated toenails.  Objective: Toenails are long thick yellow dystrophic click mycotic.  No open lesions or wounds are noted.  Assessment: Pain in limb secondary onychomycosis.  Plan: Treatment of toenails 1 through 5 bilateral.

## 2017-12-01 ENCOUNTER — Ambulatory Visit
Admission: EM | Admit: 2017-12-01 | Discharge: 2017-12-01 | Disposition: A | Discharge status: Home / Self Care | Payer: Medicare Other | Attending: Emergency Medicine | Admitting: Emergency Medicine

## 2017-12-01 ENCOUNTER — Other Ambulatory Visit: Payer: Self-pay

## 2017-12-01 ENCOUNTER — Encounter: Payer: Self-pay | Admitting: Emergency Medicine

## 2017-12-01 DIAGNOSIS — S8012XA Contusion of left lower leg, initial encounter: Secondary | ICD-10-CM

## 2017-12-01 DIAGNOSIS — L03116 Cellulitis of left lower limb: Secondary | ICD-10-CM | POA: Diagnosis not present

## 2017-12-01 HISTORY — DX: Cellulitis, unspecified: L03.90

## 2017-12-01 HISTORY — DX: Pure hypercholesterolemia, unspecified: E78.00

## 2017-12-01 NOTE — ED Provider Notes (Signed)
HPI  SUBJECTIVE:  Misty Berry is a 83 y.o. female who presents with a painful contusion to her left lower extremity after dropping a glass tray onto it this morning.  States that she bumped her left lower extremity on the bottom of a car door 2 days ago, became infected.  She was started on clindamycin and tramadol yesterday her PMD.  She is on day #2 of this.  She dropped a glass tray onto the same spot as the original trauma this morning and she reports a bruise is getting bigger, with whitish discoloration.  She states that she has difficulty walking due to the pain.  She reports constant sharp, throbbing pain.  She states that the erythema of her leg has not changed at all.  No fevers, body aches.  She has not tried anything for this.  No alleviating factors.  Symptoms are worse with walking, palpation.  Past medical history of hypertension, chronic kidney disease stage IV, osteoporosis, bruises easily, MRSA.  She is not on any antiplatelets or anticoagulants, no history of diabetes, peripheral vascular disease, peripheral arterial disease. PMD: Powell-Tillman, Sara Chu, MD   Past Medical History:  Diagnosis Date  . Asthma   . Bruises easily   . Cellulitis   . Cough   . DDD (degenerative disc disease), lumbar   . GERD (gastroesophageal reflux disease)   . HBP (high blood pressure)   . Hypercholesterolemia   . Hyperparathyroidism ([place])   . Osteoarthritis   . Osteoporosis   . Rash   . Renal disorder    kidney disease  . Swelling     Past Surgical History:  Procedure Laterality Date  . APPENDECTOMY    . BREAST REDUCTION SURGERY    . CHOLECYSTECTOMY    . EYE SURGERY Left   . FEMUR SURGERY    . FOOT SURGERY Right   . GALLBLADDER SURGERY    . LIPECTOMY    . PAROTID GLAND TUMOR EXCISION    . THYROID SURGERY    . TONSILLECTOMY    . TUBAL LIGATION    . VITRECTOMY      No family history on file.  Social History   Tobacco Use  . Smoking status: Never Smoker  .  Smokeless tobacco: Never Used  Substance Use Topics  . Alcohol use: No  . Drug use: No    No current facility-administered medications for this encounter.   Current Outpatient Medications:  .  acetaminophen (TYLENOL) 650 MG CR tablet, Take by mouth., Disp: , Rfl:  .  amLODipine (NORVASC) 5 MG tablet, TK 1 T PO  DAILY, Disp: , Rfl: 3 .  aspirin EC 81 MG tablet, Take by mouth., Disp: , Rfl:  .  atorvastatin (LIPITOR) 40 MG tablet, Take by mouth., Disp: , Rfl:  .  bromfenac (XIBROM) 0.09 % ophthalmic solution, Apply to eye., Disp: , Rfl:  .  Budesonide-Formoterol Fumarate (SYMBICORT IN), Inhale into the lungs 2 (two) times daily., Disp: , Rfl:  .  calcium-vitamin D (OSCAL WITH D) 500-200 MG-UNIT TABS tablet, Take by mouth., Disp: , Rfl:  .  clindamycin (CLEOCIN) 300 MG capsule, Take by mouth., Disp: , Rfl:  .  clobetasol ointment (TEMOVATE) 7.74 %, Apply 1 application topically 2 (two) times daily., Disp: , Rfl:  .  denosumab (PROLIA) 60 MG/ML SOSY injection, Inject into the skin., Disp: , Rfl:  .  esomeprazole (NEXIUM) 40 MG capsule, Take 40 mg by mouth daily at 12 noon., Disp: , Rfl:  .  ferrous gluconate (FERGON) 324 MG tablet, Take by mouth., Disp: , Rfl:  .  L-Methylfolate-Algae (DEPLIN 15 PO), Take by mouth daily., Disp: , Rfl:  .  losartan (COZAAR) 25 MG tablet, TK 1 T PO D, Disp: , Rfl: 1 .  metoprolol succinate (TOPROL-XL) 25 MG 24 hr tablet, TK 1/2 T PO D, Disp: , Rfl: 3 .  MILK THISTLE PO, Take by mouth daily., Disp: , Rfl:  .  Multiple Vitamins-Minerals (CENTRUM PO), Take by mouth daily., Disp: , Rfl:  .  Omega-3 Fatty Acids (FISH OIL) 1000 MG CAPS, Take by mouth daily., Disp: , Rfl:  .  Spacer/Aero-Holding Chambers (VALVED HOLDING CHAMBER) DEVI, USE AS DIRECTED, Disp: , Rfl:  .  Theanine 50 MG TBDP, Take by mouth., Disp: , Rfl:  .  torsemide (DEMADEX) 20 MG tablet, , Disp: , Rfl: 2 .  traMADol (ULTRAM) 50 MG tablet, TK 1 T PO Q 8 H PRF PAIN, Disp: , Rfl: 0 .  triamcinolone  ointment (KENALOG) 0.1 %, Apply 1 application topically daily., Disp: , Rfl:  .  vitamin B-12 (CYANOCOBALAMIN) 1000 MCG tablet, Take by mouth., Disp: , Rfl:  .  ZOLOFT 100 MG tablet, , Disp: , Rfl:   Allergies  Allergen Reactions  . Ivp Dye [Iodinated Diagnostic Agents] Other (See Comments)  . Lactose   . Methylprednisolone     Other reaction(s): Hallucination, Other (See Comments) Psychosis  . Other Other (See Comments)    Swelling and vomitting  . Peanuts [Peanut Oil]     Swelling and vomitting  . Vancomycin      ROS  As noted in HPI.   Physical Exam  BP (!) 177/81 (BP Location: Left Arm)   Pulse 68   Temp 98 F (36.7 C) (Oral)   Resp 16   Ht 5\' 2"  (1.575 m)   Wt 180 lb (81.6 kg)   SpO2 100%   BMI 32.92 kg/m   Constitutional: Well developed, well nourished, no acute distress Eyes:  EOMI, conjunctiva normal bilaterally HENT: Normocephalic, atraumatic,mucus membranes moist Respiratory: Normal inspiratory effort Cardiovascular: Normal rate GI: nondistended skin: No rash, skin intact Musculoskeletal: 13 x 13 cm tender area of erythema, increased temperature.  Marked area of erythema with a marker for reference.  Large hematoma with central fluctuance anterior shin.  See picture.     Neurologic: Alert & oriented x 3, no focal neuro deficits Psychiatric: Speech and behavior appropriate   ED Course   Medications - No data to display  No orders of the defined types were placed in this encounter.   No results found for this or any previous visit (from the past 24 hour(s)). No results found.  ED Clinical Impression  Contusion of left lower leg, initial encounter  Cellulitis of leg, left   ED Assessment/Plan  Outside records reviewed. As noted in HPI.   Procedure note: Cleaned area with alcohol and chlorhexidine several times.  Using sterile technique, made a single incision with an 11 blade.  Expressed pus, blood, and clot with improvement in  symptoms.  Then irrigated with 120 cc of sterile saline.  Placed sterile dressing on top of this.  Patient tolerated procedure well.  Patient to continue clindamycin, elevate, keep this clean and dry.  She is to return here in 2 days for wound check.  She is to go to the ER if she gets worse in any way. Discussed  MDM, treatment plan, and plan for follow-up with patient. Discussed sn/sx that should  prompt return to the ED. patient agrees with plan.   No orders of the defined types were placed in this encounter.   *This clinic note was created using Dragon dictation software. Therefore, there may be occasional mistakes despite careful proofreading.   ?   Melynda Ripple, MD 12/01/17 1204

## 2017-12-01 NOTE — ED Triage Notes (Signed)
Patient c/o hitting left lower leg on a car door causing a bruise to left lower leg. This morning she dropped a glass tray on the same spot causing increased swelling and bruising.

## 2017-12-01 NOTE — Discharge Instructions (Addendum)
Return here or see your doctor in 2 days for wound check.  Keep it clean and dry until it is evaluated.  Go to the ER for fevers above 100.4, pain not controlled with Tylenol and tramadol, if the redness starts to spread beyond the marker, body aches, or other concerns.

## 2017-12-01 NOTE — ED Triage Notes (Signed)
Patient also reports starting Clindamycin 2 days ago for cellulitis to left lower leg.

## 2017-12-03 ENCOUNTER — Other Ambulatory Visit: Payer: Self-pay

## 2017-12-03 ENCOUNTER — Ambulatory Visit
Admission: EM | Admit: 2017-12-03 | Discharge: 2017-12-03 | Disposition: A | Discharge status: Home / Self Care | Payer: Medicare Other | Attending: Emergency Medicine | Admitting: Emergency Medicine

## 2017-12-03 DIAGNOSIS — L03116 Cellulitis of left lower limb: Secondary | ICD-10-CM

## 2017-12-03 DIAGNOSIS — Z5189 Encounter for other specified aftercare: Secondary | ICD-10-CM

## 2017-12-03 DIAGNOSIS — S8012XD Contusion of left lower leg, subsequent encounter: Secondary | ICD-10-CM

## 2017-12-03 MED ORDER — MUPIROCIN 2 % EX OINT: 1.0000 "application " | TOPICAL_OINTMENT | Freq: Three times a day (TID) | CUTANEOUS | 0 refills | Status: DC

## 2017-12-03 NOTE — ED Provider Notes (Signed)
HPI  SUBJECTIVE:  Misty Berry is a 83 y.o. female who presents for wound recheck.  She is status post I&D of a large hematoma on her left lower extremity using sterile technique 2 days ago, was told to return today.  Is currently on clindamycin for cellulitis of this leg.  States that overall she is getting better.  She reports decreased redness, states that there is no redness this morning when she change the dressing, she reports decreased pain.  No fevers, body aches.  She has not needed any pain medications for this.  She has been doing dressing changes.  Symptoms are better with clindamycin.  No aggravating factors. PMD: Powell-Tillman, Sara Chu, MD   Past Medical History:  Diagnosis Date  . Asthma   . Bruises easily   . Cellulitis   . Cough   . DDD (degenerative disc disease), lumbar   . GERD (gastroesophageal reflux disease)   . HBP (high blood pressure)   . Hypercholesterolemia   . Hyperparathyroidism ([place])   . Osteoarthritis   . Osteoporosis   . Rash   . Renal disorder    kidney disease  . Swelling     Past Surgical History:  Procedure Laterality Date  . APPENDECTOMY    . BREAST REDUCTION SURGERY    . CHOLECYSTECTOMY    . EYE SURGERY Left   . FEMUR SURGERY    . FOOT SURGERY Right   . GALLBLADDER SURGERY    . LIPECTOMY    . PAROTID GLAND TUMOR EXCISION    . THYROID SURGERY    . TONSILLECTOMY    . TUBAL LIGATION    . VITRECTOMY      History reviewed. No pertinent family history.  Social History   Tobacco Use  . Smoking status: Never Smoker  . Smokeless tobacco: Never Used  Substance Use Topics  . Alcohol use: No  . Drug use: No    No current facility-administered medications for this encounter.   Current Outpatient Medications:  .  acetaminophen (TYLENOL) 650 MG CR tablet, Take by mouth., Disp: , Rfl:  .  amLODipine (NORVASC) 5 MG tablet, TK 1 T PO  DAILY, Disp: , Rfl: 3 .  aspirin EC 81 MG tablet, Take by mouth., Disp: , Rfl:  .   atorvastatin (LIPITOR) 40 MG tablet, Take by mouth., Disp: , Rfl:  .  bromfenac (XIBROM) 0.09 % ophthalmic solution, Apply to eye., Disp: , Rfl:  .  Budesonide-Formoterol Fumarate (SYMBICORT IN), Inhale into the lungs 2 (two) times daily., Disp: , Rfl:  .  calcium-vitamin D (OSCAL WITH D) 500-200 MG-UNIT TABS tablet, Take by mouth., Disp: , Rfl:  .  clindamycin (CLEOCIN) 300 MG capsule, Take by mouth., Disp: , Rfl:  .  clobetasol ointment (TEMOVATE) 7.42 %, Apply 1 application topically 2 (two) times daily., Disp: , Rfl:  .  denosumab (PROLIA) 60 MG/ML SOSY injection, Inject into the skin., Disp: , Rfl:  .  esomeprazole (NEXIUM) 40 MG capsule, Take 40 mg by mouth daily at 12 noon., Disp: , Rfl:  .  ferrous gluconate (FERGON) 324 MG tablet, Take by mouth., Disp: , Rfl:  .  L-Methylfolate-Algae (DEPLIN 15 PO), Take by mouth daily., Disp: , Rfl:  .  losartan (COZAAR) 25 MG tablet, TK 1 T PO D, Disp: , Rfl: 1 .  metoprolol succinate (TOPROL-XL) 25 MG 24 hr tablet, TK 1/2 T PO D, Disp: , Rfl: 3 .  MILK THISTLE PO, Take by mouth daily., Disp: ,  Rfl:  .  Multiple Vitamins-Minerals (CENTRUM PO), Take by mouth daily., Disp: , Rfl:  .  mupirocin ointment (BACTROBAN) 2 %, Apply 1 application topically 3 (three) times daily., Disp: 22 g, Rfl: 0 .  Omega-3 Fatty Acids (FISH OIL) 1000 MG CAPS, Take by mouth daily., Disp: , Rfl:  .  Spacer/Aero-Holding Chambers (VALVED HOLDING CHAMBER) DEVI, USE AS DIRECTED, Disp: , Rfl:  .  Theanine 50 MG TBDP, Take by mouth., Disp: , Rfl:  .  torsemide (DEMADEX) 20 MG tablet, , Disp: , Rfl: 2 .  traMADol (ULTRAM) 50 MG tablet, TK 1 T PO Q 8 H PRF PAIN, Disp: , Rfl: 0 .  triamcinolone ointment (KENALOG) 0.1 %, Apply 1 application topically daily., Disp: , Rfl:  .  vitamin B-12 (CYANOCOBALAMIN) 1000 MCG tablet, Take by mouth., Disp: , Rfl:  .  ZOLOFT 100 MG tablet, , Disp: , Rfl:   Allergies  Allergen Reactions  . Ivp Dye [Iodinated Diagnostic Agents] Other (See Comments)   . Lactose   . Methylprednisolone     Other reaction(s): Hallucination, Other (See Comments) Psychosis  . Other Other (See Comments)    Swelling and vomitting  . Peanuts [Peanut Oil]     Swelling and vomitting  . Vancomycin   . Wellbutrin [Bupropion] Other (See Comments)    Hallucinations     ROS  As noted in HPI.   Physical Exam  BP (!) 172/73 (BP Location: Left Arm)   Pulse 66   Temp 97.8 F (36.6 C) (Oral)   Resp 16   SpO2 100%   Constitutional: Well developed, well nourished, no acute distress Eyes:  EOMI, conjunctiva normal bilaterally HENT: Normocephalic, atraumatic,mucus membranes moist Respiratory: Normal inspiratory effort Cardiovascular: Normal rate GI: nondistended skin: Continued mild but decreased tenderness at bruise/surgical site, with mild bleeding.  The wound area is flat.  Area of cellulitis appears to be unchanged but it has not spread beyond the line that was made 2 days ago.  Positive increased temperature, no tenderness along the cellulitis.   Musculoskeletal: no deformities Neurologic: Alert & oriented x 3, no focal neuro deficits Psychiatric: Speech and behavior appropriate   ED Course   Medications - No data to display  No orders of the defined types were placed in this encounter.   No results found for this or any previous visit (from the past 24 hour(s)). No results found.  ED Clinical Impression  Visit for wound check  Contusion of left lower extremity, subsequent encounter  Cellulitis of left lower extremity   ED Assessment/Plan  Patient states that she did not have any redness on her lower extremity this morning, she states overall she is getting better.  Will send home with Bactroban, have her continue clindamycin, follow-up with her primary care physician on Monday.  She may need a referral to the wound center.  Discussed  MDM, treatment plan, and plan for follow-up with patient. Discussed sn/sx that should prompt  return to the ED. patient agrees with plan.   Meds ordered this encounter  Medications  . mupirocin ointment (BACTROBAN) 2 %    Sig: Apply 1 application topically 3 (three) times daily.    Dispense:  22 g    Refill:  0    *This clinic note was created using [employer]. Therefore, there may be occasional mistakes despite careful proofreading.   ?   Melynda Ripple, MD 12/03/17 [phone]

## 2017-12-03 NOTE — ED Triage Notes (Signed)
Reports recent "lancing" of left lower leg hematoma. This was Thursday.    Here for a wound recheck.    Ambulatory to room via walker help.

## 2017-12-03 NOTE — Discharge Instructions (Addendum)
Continue clindamycin, wound care and dressing changes.  Try to keep this elevated as much as you can.  Go to the ER for fevers, body aches, or if the redness extends beyond the line that we may 2 days ago.

## 2017-12-03 NOTE — ED Notes (Signed)
ED Provider at bedside. 

## 2017-12-13 ENCOUNTER — Ambulatory Visit
Admission: EM | Admit: 2017-12-13 | Discharge: 2017-12-13 | Disposition: A | Discharge status: Home / Self Care | Payer: Medicare Other | Attending: Family Medicine | Admitting: Family Medicine

## 2017-12-13 ENCOUNTER — Other Ambulatory Visit: Payer: Self-pay

## 2017-12-13 ENCOUNTER — Encounter: Payer: Self-pay | Admitting: Emergency Medicine

## 2017-12-13 DIAGNOSIS — Z5189 Encounter for other specified aftercare: Secondary | ICD-10-CM

## 2017-12-13 MED ORDER — CLINDAMYCIN HCL 300 MG PO CAPS: 300.0000 mg | ORAL_CAPSULE | Freq: Four times a day (QID) | ORAL | 0 refills | Status: DC

## 2017-12-13 NOTE — ED Provider Notes (Signed)
[facility]    CSN: [id number] Arrival date & time: 12/13/17  1402  History   Chief Complaint Chief Complaint  Patient presents with  . Foot Pain    left   HPI  83 year old female presents for reevaluation of the wound.  Patient was recently seen on 6/20 and 6/22.  At her first visit she had incision and drainage.  She has been on clindamycin.  She states that her wound has improved but continues to persist.  She has swelling but this is chronic secondary to venous insufficiency.  She states that her wound has now began to elevate more.  She is concerned about a recurrence of infection.  She reports bleeding of the wound but no purulent drainage.  It is exquisitely tender to palpation.  No fever.  No chills.  She states that she otherwise feels well.  She has recently finished clindamycin.  She has contacted wound care but cannot be seen until later this month.  She is keeping the wound dressed with nonstick bandage and Coban.  No other reported symptoms.  No other complaints.  Past Medical History:  Diagnosis Date  . Asthma   . Bruises easily   . Cellulitis   . Cough   . DDD (degenerative disc disease), lumbar   . GERD (gastroesophageal reflux disease)   . HBP (high blood pressure)   . Hypercholesterolemia   . Hyperparathyroidism ([place])   . Osteoarthritis   . Osteoporosis   . Rash   . Renal disorder    kidney disease  . Swelling     Patient Active Problem List   Diagnosis Date Noted  . Acid reflux 08/27/2015  . Cellulitis 08/27/2015  . Abnormal bruising 08/27/2015  . Arthritis, degenerative 08/27/2015  . OP (osteoporosis) 08/27/2015  . Presence of artificial eye 08/27/2015  . History of surgical procedure 08/27/2015  . Hypertensive heart disease with stage 4 chronic kidney disease ([place]) 04/30/2015  . Laceration of forearm without foreign body 04/30/2015  . Venous insufficiency of leg 02/28/2015  . Gluteal tendinitis 01/22/2015  . Degenerative arthritis of  lumbar spine 12/12/2014  . Back muscle spasm 11/14/2014  . Degeneration of intervertebral disc of lumbar region 11/14/2014  . Neuritis or radiculitis due to rupture of lumbar intervertebral disc 11/14/2014  . Allergic rhinitis 08/31/2014  . Clinical depression 02/19/2014  . Gait instability 02/19/2014  . Benign hypertensive heart and kidney disease 10/31/2013  . Left heart failure ([place]) 10/31/2013  . Branch retinal vein occlusion 09/11/2013  . Asthma 06/20/2013  . Essential hypertension 06/20/2013  . Follow-up examination 04/30/2013  . Acute renal failure ([place]) 02/16/2013  . Fall 02/16/2013  . Closed fracture of part of neck of femur ([place]) 02/16/2013  . Cellulitis and abscess of leg 10/30/2012  . Chronic kidney disease, stage III (moderate) ([place]) 10/30/2012  . Cellulitis of lower extremity 10/30/2012  . Chronic kidney disease, stage IV (severe) ([place]) 10/30/2012  . Central retinal edema, cystoid 01/31/2012  . Impaired vision 12/17/2011    Past Surgical History:  Procedure Laterality Date  . APPENDECTOMY    . BREAST REDUCTION SURGERY    . CHOLECYSTECTOMY    . EYE SURGERY Left   . FEMUR SURGERY    . FOOT SURGERY Right   . GALLBLADDER SURGERY    . LIPECTOMY    . PAROTID GLAND TUMOR EXCISION    . THYROID SURGERY    . TONSILLECTOMY    . TUBAL LIGATION    . VITRECTOMY  OB History   None      Home Medications    Prior to Admission medications   Medication Sig Start Date End Date Taking? Authorizing Provider  acetaminophen (TYLENOL) 650 MG CR tablet Take by mouth.   Yes [provider]  amLODipine (NORVASC) 5 MG tablet TK 1 T PO  DAILY 07/03/15  Yes [provider]  aspirin EC 81 MG tablet Take by mouth.   Yes [provider]  atorvastatin (LIPITOR) 40 MG tablet Take by mouth. 09/09/16  Yes [provider]  bromfenac (XIBROM) 0.09 % ophthalmic solution Apply to eye. 12/27/11  Yes [provider]  Budesonide-Formoterol  Fumarate (SYMBICORT IN) Inhale into the lungs 2 (two) times daily.   Yes [provider]  calcium-vitamin D (OSCAL WITH D) 500-200 MG-UNIT TABS tablet Take by mouth.   Yes [provider]  clobetasol ointment (TEMOVATE) 9.60 % Apply 1 application topically 2 (two) times daily.   Yes [provider]  denosumab (PROLIA) 60 MG/ML SOSY injection Inject into the skin. 09/08/16  Yes [provider]  esomeprazole (NEXIUM) 40 MG capsule Take 40 mg by mouth daily at 12 noon.   Yes [provider]  ferrous gluconate (FERGON) 324 MG tablet Take by mouth.   Yes [provider]  L-Methylfolate-Algae (DEPLIN 15 PO) Take by mouth daily.   Yes [provider]  losartan (COZAAR) 25 MG tablet TK 1 T PO D 06/18/15  Yes [provider]  metoprolol succinate (TOPROL-XL) 25 MG 24 hr tablet TK 1/2 T PO D 06/17/15  Yes [provider]  Multiple Vitamins-Minerals (CENTRUM PO) Take by mouth daily.   Yes [provider]  mupirocin ointment (BACTROBAN) 2 % Apply 1 application topically 3 (three) times daily. 12/03/17  Yes Melynda Ripple, MD  Omega-3 Fatty Acids (FISH OIL) 1000 MG CAPS Take by mouth daily.   Yes [provider]  Spacer/Aero-Holding Chambers (VALVED HOLDING CHAMBER) DEVI USE AS DIRECTED 12/11/12  Yes [provider]  Theanine 50 MG TBDP Take by mouth.   Yes [provider]  torsemide (DEMADEX) 20 MG tablet  09/16/17  Yes [provider]  traMADol (ULTRAM) 50 MG tablet TK 1 T PO Q 8 H PRF PAIN 10/18/17  Yes [provider]  triamcinolone ointment (KENALOG) 0.1 % Apply 1 application topically daily.   Yes [provider]  vitamin B-12 (CYANOCOBALAMIN) 1000 MCG tablet Take by mouth.   Yes [provider]  ZOLOFT 100 MG tablet  06/07/15  Yes [provider]  clindamycin (CLEOCIN) 300 MG capsule Take 1 capsule (300 mg total) by mouth 4 (four) times daily. 12/13/17    Coral Spikes, DO  MILK THISTLE PO Take by mouth daily.    [provider]   Social History Social History   Tobacco Use  . Smoking status: Never Smoker  . Smokeless tobacco: Never Used  Substance Use Topics  . Alcohol use: No  . Drug use: No   Allergies   Ivp dye [iodinated diagnostic agents]; Lactose; Methylprednisolone; Other; Peanuts [peanut oil]; Vancomycin; and Wellbutrin [bupropion]  Review of Systems Review of Systems  Constitutional: Negative.   Skin: Positive for wound.   Physical Exam Triage Vital Signs ED Triage Vitals  Enc Vitals Group     BP 12/13/17 1415 134/81     Pulse Rate 12/13/17 1415 60     Resp 12/13/17 1415 16     Temp 12/13/17 1415 98 F (36.7 C)  Temp Source 12/13/17 1415 Oral     SpO2 12/13/17 1415 100 %     Weight 12/13/17 1412 189 lb (85.7 kg)     Height --      Head Circumference --      Peak Flow --      Pain Score 12/13/17 1412 3     Pain Loc --      Pain Edu? --      Excl. in [place]? --    Updated Vital Signs BP 134/81 (BP Location: Left Arm)   Pulse 60   Temp 98 F (36.7 C) (Oral)   Resp 16   Wt 189 lb (85.7 kg)   SpO2 100%   BMI 34.57 kg/m   Physical Exam  Constitutional: She is oriented to person, place, and time. She appears well-developed. No distress.  HENT:  Head: Normocephalic and atraumatic.  Pulmonary/Chest: Effort normal. No respiratory distress.  Neurological: She is alert and oriented to person, place, and time.  Skin:  See picture below.  Area surrounding wound is raised.  Questionable fluctuance.  No drainage.  Patient has surrounding erythema but this is secondary to venous stasis. 2-3 + pitting LE edema.   Psychiatric: She has a normal mood and affect. Her behavior is normal.  Nursing note and vitals reviewed.    UC Treatments / Results  Labs (all labs ordered are listed, but only abnormal results are displayed) Labs Reviewed - No data to display  EKG None  Radiology No results  found.  Procedures Procedures (including critical care time)  Medications Ordered in UC Medications - No data to display  Initial Impression / Assessment and Plan / UC Course  I have reviewed the triage vital signs and the nursing notes.  Pertinent labs & imaging results that were available during my care of the patient were reviewed by me and considered in my medical decision making (see chart for details).    83 year old female presents for reevaluation regarding a wound.  Placing patient on additional 5 days of clindamycin.  Patient to follow-up with primary.  Wound dressed today with Tegaderm.  Final Clinical Impressions(s) / UC Diagnoses   Final diagnoses:  Visit for wound check     Discharge Instructions     Clinda as prescribed.  Follow up with your PCP.  Take care  Dr. Lacinda Axon    ED Prescriptions    Medication Sig Dispense Auth. Provider   clindamycin (CLEOCIN) 300 MG capsule Take 1 capsule (300 mg total) by mouth 4 (four) times daily. 20 capsule Coral Spikes, DO     Controlled Substance Prescriptions [place] Controlled Substance Registry consulted? Not Applicable   Coral Spikes, DO 12/13/17 1448

## 2017-12-13 NOTE — Discharge Instructions (Signed)
Clinda as prescribed.  Follow up with your PCP.  Take care  Dr. Lacinda Axon

## 2017-12-13 NOTE — ED Triage Notes (Signed)
Patient c/o left leg pain for 2 weeks.  Patient here for wound check.  Patient c/o swelling and drainage from the wound.

## 2017-12-26 ENCOUNTER — Encounter: Payer: Medicare Other | Attending: Physician Assistant | Admitting: Physician Assistant

## 2017-12-26 DIAGNOSIS — L02416 Cutaneous abscess of left lower limb: Secondary | ICD-10-CM | POA: Diagnosis present

## 2017-12-26 DIAGNOSIS — F329 Major depressive disorder, single episode, unspecified: Secondary | ICD-10-CM | POA: Diagnosis not present

## 2017-12-26 DIAGNOSIS — Z947 Corneal transplant status: Secondary | ICD-10-CM | POA: Diagnosis not present

## 2017-12-26 DIAGNOSIS — F419 Anxiety disorder, unspecified: Secondary | ICD-10-CM | POA: Diagnosis not present

## 2017-12-26 DIAGNOSIS — M1990 Unspecified osteoarthritis, unspecified site: Secondary | ICD-10-CM | POA: Diagnosis not present

## 2017-12-26 DIAGNOSIS — I5022 Chronic systolic (congestive) heart failure: Secondary | ICD-10-CM | POA: Diagnosis not present

## 2017-12-26 DIAGNOSIS — L97822 Non-pressure chronic ulcer of other part of left lower leg with fat layer exposed: Secondary | ICD-10-CM | POA: Diagnosis not present

## 2017-12-26 DIAGNOSIS — R600 Localized edema: Secondary | ICD-10-CM | POA: Insufficient documentation

## 2017-12-26 DIAGNOSIS — I110 Hypertensive heart disease with heart failure: Secondary | ICD-10-CM | POA: Insufficient documentation

## 2017-12-26 DIAGNOSIS — Z8249 Family history of ischemic heart disease and other diseases of the circulatory system: Secondary | ICD-10-CM | POA: Diagnosis not present

## 2017-12-26 DIAGNOSIS — Z87891 Personal history of nicotine dependence: Secondary | ICD-10-CM | POA: Insufficient documentation

## 2017-12-26 DIAGNOSIS — J45909 Unspecified asthma, uncomplicated: Secondary | ICD-10-CM | POA: Diagnosis not present

## 2017-12-26 DIAGNOSIS — I872 Venous insufficiency (chronic) (peripheral): Secondary | ICD-10-CM | POA: Diagnosis not present

## 2017-12-27 ENCOUNTER — Other Ambulatory Visit
Admission: RE | Admit: 2017-12-27 | Discharge: 2017-12-27 | Disposition: A | Discharge status: Home / Self Care | Payer: Medicare Other | Source: Ambulatory Visit | Attending: Physician Assistant | Admitting: Physician Assistant

## 2017-12-27 DIAGNOSIS — L089 Local infection of the skin and subcutaneous tissue, unspecified: Secondary | ICD-10-CM | POA: Diagnosis present

## 2017-12-27 NOTE — Progress Notes (Addendum)
EVELYNA, FOLKER ([id number]) Visit Report for 12/26/2017 Allergy List Details Patient Name: ANJALEE, COPE. Date of Service: 12/26/2017 2:45 PM Medical Record Number: [id number] Patient Account Number: [id number] Date of Birth/Sex: 06/28/1934 (83 y.o. Female) Treating RN: Carolyne Fiscal, Debi Primary Care Destani Wamser: Ceasar Lund Other Clinician: Referring Jaicey Sweaney: Ceasar Lund Treating Lucciano Vitali/Extender: STONE III, HOYT Weeks in Treatment: 0 Allergies Active Allergies methylprednisolone peanut Iodinated Contrast- Oral and IV Dye loxaglate sodium Solu-Medrol vancomycin Allergy Notes Electronic Signature(s) Signed: 12/26/2017 5:41:08 PM By: Alric Quan Entered By: Alric Quan on 12/26/2017 15:10:09 Shaheed, Marlean D. ([id number]) -------------------------------------------------------------------------------- Arrival Information Details Patient Name: KELLEE, SITTNER D. Date of Service: 12/26/2017 2:45 PM Medical Record Number: [id number] Patient Account Number: [id number] Date of Birth/Sex: 1934-06-24 (83 y.o. Female) Treating RN: Carolyne Fiscal, Debi Primary Care Maxx Calaway: Ceasar Lund Other Clinician: Referring Aikam Vinje: Ceasar Lund Treating Corinthia Helmers/Extender: Melburn Hake, HOYT Weeks in Treatment: 0 Visit Information Patient Arrived: Walker Arrival Time: 15:03 Accompanied By: self Transfer Assistance: EasyPivot Patient Lift Patient Identification Verified: Yes Secondary Verification Process Yes Completed: Patient Requires Transmission-Based No Precautions: Patient Has Alerts: No Electronic Signature(s) Signed: 12/26/2017 5:41:08 PM By: Alric Quan Entered By: Alric Quan on 12/26/2017 15:04:10 Hefel, Adlai D. ([id number]) -------------------------------------------------------------------------------- Clinic Level of Care Assessment Details Patient Name: [name] Date of Service: 12/26/2017 2:45 PM Medical Record  Number: [id number] Patient Account Number: [id number] Date of Birth/Sex: 1935-02-03 (83 y.o. Female) Treating RN: Roger Shelter Primary Care Tamyrah Burbage: Ceasar Lund Other Clinician: Referring Natassia Guthridge: Ceasar Lund Treating Laiah Pouncey/Extender: Melburn Hake, HOYT Weeks in Treatment: 0 Clinic Level of Care Assessment Items TOOL 1 Quantity Score X - Use when EandM and Procedure is performed on INITIAL visit 1 0 ASSESSMENTS - Nursing Assessment / Reassessment X - General Physical Exam (combine w/ comprehensive assessment (listed just below) when 1 20 performed on new pt. evals) X- 1 25 Comprehensive Assessment (HX, ROS, Risk Assessments, Wounds Hx, etc.) ASSESSMENTS - Wound and Skin Assessment / Reassessment []  - Dermatologic / Skin Assessment (not related to wound area) 0 ASSESSMENTS - Ostomy and/or Continence Assessment and Care []  - Incontinence Assessment and Management 0 []  - 0 Ostomy Care Assessment and Management (repouching, etc.) PROCESS - Coordination of Care X - Simple Patient / Family Education for ongoing care 1 15 []  - 0 Complex (extensive) Patient / Family Education for ongoing care []  - 0 Staff obtains Programmer, systems, Records, Test Results / Process Orders []  - 0 Staff telephones HHA, Nursing Homes / Clarify orders / etc []  - 0 Routine Transfer to another Facility (non-emergent condition) []  - 0 Routine Hospital Admission (non-emergent condition) []  - 0 New Admissions / Biomedical engineer / Ordering NPWT, Apligraf, etc. []  - 0 Emergency Hospital Admission (emergent condition) PROCESS - Special Needs []  - Pediatric / Minor Patient Management 0 []  - 0 Isolation Patient Management []  - 0 Hearing / Language / Visual special needs []  - 0 Assessment of Community assistance (transportation, D/C planning, etc.) []  - 0 Additional assistance / Altered mentation []  - 0 Support Surface(s) Assessment (bed, cushion, seat, etc.) Mecca, Aiyana D.  ([id number]) INTERVENTIONS - Miscellaneous []  - External ear exam 0 []  - 0 Patient Transfer (multiple staff / Civil Service fast streamer / Similar devices) []  - 0 Simple Staple / Suture removal (25 or less) []  - 0 Complex Staple / Suture removal (26 or more) []  - 0 Hypo/Hyperglycemic Management (do not check if billed separately) X- 1 15 Ankle / Brachial Index (ABI) - do not check if billed separately Has the patient been  seen at the hospital within the last three years: Yes Total Score: 75 Level Of Care: New/Established - Level 2 Electronic Signature(s) Signed: 12/26/2017 5:44:59 PM By: Roger Shelter Entered By: Roger Shelter on 12/26/2017 16:29:03 Aldridge, Leyan D. ([id number]) -------------------------------------------------------------------------------- Encounter Discharge Information Details Patient Name: ANORA, SCHWENKE D. Date of Service: 12/26/2017 2:45 PM Medical Record Number: [id number] Patient Account Number: [id number] Date of Birth/Sex: 1934/08/10 (83 y.o. Female) Treating RN: Montey Hora Primary Care Salar Molden: Ceasar Lund Other Clinician: Referring Elberta Lachapelle: Ceasar Lund Treating Akacia Boltz/Extender: Melburn Hake, HOYT Weeks in Treatment: 0 Encounter Discharge Information Items Discharge Condition: Stable Ambulatory Status: Walker Discharge Destination: Home Transportation: Private Auto Accompanied By: self Schedule Follow-up Appointment: Yes Clinical Summary of Care: Electronic Signature(s) Signed: 12/26/2017 5:53:18 PM By: Montey Hora Entered By: Montey Hora on 12/26/2017 17:10:24 Briddell, Leylanie D. ([id number]) -------------------------------------------------------------------------------- General Visit Notes Details Patient Name: [name] Date of Service: 12/26/2017 2:45 PM Medical Record Number: [id number] Patient Account Number: [id number] Date of Birth/Sex: 02-May-1935 (83 y.o. Female) Treating RN: Montey Hora Primary Care  Burman Bruington: Ceasar Lund Other Clinician: Referring Macon Sandiford: Ceasar Lund Treating Jarris Kortz/Extender: Melburn Hake, HOYT Weeks in Treatment: 0 Notes Upon arrival into patient's room, PA and CM were still in the room. PA finished debriding wound and asked that I attempt an ABI on patient's LLE because it was not tolerated during intake because of pain. I assessed patient's ABI and afterwards patient stated that she had to go to the bathroom. Wound had gauze packed into it to achieve hemostasis and I reinforced the gauze with more gauze and tape and put patient's shoes on. While patient was ambulating to the bathroom, I noticed that the gauze was already soaking with blood so I reinforced it with an abd pad so patient could go to the bathroom. When she came out and returned to the room, her sock was saturated with blood. I called Sallie into the room to assist with getting extra gauze and tape. Debi also came in and they cleaned her leg while I held pressure. Debi notified Margarita Grizzle and he arrived and achieved hemostasis using multiple silver nitrate sticks. He then packed wound with silvercel to be sure wound had stopped bleeding. Patient had no signs of active bleeding when she left [facility] [place]. Electronic Signature(s) Signed: 12/26/2017 5:52:55 PM By: Montey Hora Entered By: Montey Hora on 12/26/2017 17:52:55 Depinto, Kaidence D. ([id number]) -------------------------------------------------------------------------------- Lower Extremity Assessment Details Patient Name: CLAIRA, JETER D. Date of Service: 12/26/2017 2:45 PM Medical Record Number: [id number] Patient Account Number: [id number] Date of Birth/Sex: [birth year] (83 y.o. Female) Treating RN: Carolyne Fiscal, Debi Primary Care Jakiah Bienaime: Ceasar Lund Other Clinician: Referring Pura Picinich: Ceasar Lund Treating Kyl Givler/Extender: Melburn Hake, HOYT Weeks in Treatment: 0 Edema Assessment Assessed: [Left: No]  [Right: No] [Left: Edema] [Right: :] Calf Left: Right: Point of Measurement: 33 cm From Medial Instep 37.3 cm 39.5 cm Ankle Left: Right: Point of Measurement: 10 cm From Medial Instep 22.7 cm 24.9 cm Vascular Assessment Pulses: Dorsalis Pedis Palpable: [Left:Yes] [Right:Yes] Doppler Audible: [Left:Yes] [Right:Yes] Posterior Tibial Extremity colors, hair growth, and conditions: Extremity Color: [Left:Red] [Right:Red] Hair Growth on Extremity: [Left:Yes] [Right:Yes] Temperature of Extremity: [Left:Warm] [Right:Warm] Capillary Refill: [Left:< 3 seconds] [Right:< 3 seconds] Blood Pressure: Brachial: [Left:128] Dorsalis Pedis: 146 [Left:Dorsalis Pedis:] Ankle: Posterior Tibial: [Left:Posterior Tibial: 1.14] Toe Nail Assessment Left: Right: Thick: Yes Yes Discolored: Yes Yes Deformed: Yes Yes Improper Length and Hygiene: No No Electronic Signature(s) Signed: 12/26/2017 5:41:08 PM By: Alric Quan Signed: 12/26/2017 5:53:18 PM By: Montey Hora  Entered By: Montey Hora on 12/26/2017 16:34:36 Elgersma, Isla D. ([id number]) -------------------------------------------------------------------------------- Multi Wound Chart Details Patient Name: JOSSLIN, SANJUAN. Date of Service: 12/26/2017 2:45 PM Medical Record Number: [id number] Patient Account Number: [id number] Date of Birth/Sex: Nov 09, 1934 (83 y.o. Female) Treating RN: Roger Shelter Primary Care Asmi Fugere: Ceasar Lund Other Clinician: Referring Drexel Ivey: Ceasar Lund Treating Jaylenn Altier/Extender: STONE III, HOYT Weeks in Treatment: 0 Vital Signs Height(in): 61 Pulse(bpm): 65 Weight(lbs): 179 Blood Pressure(mmHg): 126/65 Body Mass Index(BMI): 34 Temperature(F): 98.1 Respiratory Rate 18 (breaths/min): Photos: [1:No Photos] [N/A:N/A] Wound Location: [1:Left Lower Leg - Anterior] [N/A:N/A] Wounding Event: [1:Trauma] [N/A:N/A] Primary Etiology: [1:Trauma, Other] [N/A:N/A] Comorbid History:  [1:Cataracts, Asthma, Arrhythmia, Congestive Heart Failure, Hypertension, Osteoarthritis] [N/A:N/A] Date Acquired: [1:12/05/2017] [N/A:N/A] Weeks of Treatment: [1:0] [N/A:N/A] Wound Status: [1:Open] [N/A:N/A] Measurements L x W x D [1:0.8x1.3x1.1] [N/A:N/A] (cm) Area (cm) : [1:0.817] [N/A:N/A] Volume (cm) : [1:0.898] [N/A:N/A] Starting Position 1 [1:12] (o'clock): Ending Position 1 [1:12] (o'clock): Maximum Distance 1 (cm): [1:1.9] Undermining: [1:Yes] [N/A:N/A] Classification: [1:Full Thickness Without Exposed Support Structures] [N/A:N/A] Exudate Amount: [1:Large] [N/A:N/A] Exudate Type: [1:Sanguinous] [N/A:N/A] Exudate Color: [1:red] [N/A:N/A] Wound Margin: [1:Distinct, outline attached] [N/A:N/A] Granulation Amount: [1:Large (67-100%)] [N/A:N/A] Granulation Quality: [1:Red] [N/A:N/A] Necrotic Amount: [1:Small (1-33%)] [N/A:N/A] Necrotic Tissue: [1:Eschar] [N/A:N/A] Exposed Structures: [1:Fascia: No Fat Layer (Subcutaneous Tissue) Exposed: No Tendon: No Muscle: No] [N/A:N/A] Joint: No Bone: No Epithelialization: None N/A N/A Periwound Skin Texture: No Abnormalities Noted N/A N/A Periwound Skin Moisture: No Abnormalities Noted N/A N/A Periwound Skin Color: Erythema: Yes N/A N/A Erythema Location: Circumferential N/A N/A Temperature: No Abnormality N/A N/A Tenderness on Palpation: Yes N/A N/A Wound Preparation: Ulcer Cleansing: N/A N/A Rinsed/Irrigated with Saline Topical Anesthetic Applied: Other: LIDOCAINE 4% Treatment Notes Electronic Signature(s) Signed: 12/26/2017 5:44:59 PM By: Roger Shelter Entered By: Roger Shelter on 12/26/2017 16:05:53 Staples, Analeese D. ([id number]) -------------------------------------------------------------------------------- [place] Details Patient Name: ELIANNA, WINDOM D. Date of Service: 12/26/2017 2:45 PM Medical Record Number: [id number] Patient Account Number: [id number] Date of Birth/Sex: 31-Aug-1934 (83 y.o.  Female) Treating RN: Roger Shelter Primary Care Anny Sayler: Ceasar Lund Other Clinician: Referring Kendelle Schweers: Ceasar Lund Treating Selina Tapper/Extender: Melburn Hake, HOYT Weeks in Treatment: 0 Active Inactive ` Orientation to the Wound Care Program Nursing Diagnoses: Knowledge deficit related to the wound healing center program Goals: Patient/caregiver will verbalize understanding of the [place] Program Date Initiated: 12/26/2017 Target Resolution Date: 01/16/2018 Goal Status: Active Interventions: Provide education on orientation to the wound center Notes: ` Wound/Skin Impairment Nursing Diagnoses: Impaired tissue integrity Goals: Patient/caregiver will verbalize understanding of skin care regimen Date Initiated: 12/26/2017 Target Resolution Date: 01/16/2018 Goal Status: Active Ulcer/skin breakdown will have a volume reduction of 30% by week 4 Date Initiated: 12/26/2017 Target Resolution Date: 01/16/2018 Goal Status: Active Interventions: Assess patient/caregiver ability to obtain necessary supplies Assess patient/caregiver ability to perform ulcer/skin care regimen upon admission and as needed Assess ulceration(s) every visit Treatment Activities: Skin care regimen initiated : 12/26/2017 Notes: Electronic Signature(s) Signed: 12/26/2017 5:44:59 PM By: Valentina Gu, Teshia D. ([id number]) Entered By: Roger Shelter on 12/26/2017 16:05:25 Hajjar, Ahsha D. ([id number]) -------------------------------------------------------------------------------- Pain Assessment Details Patient Name: WILLINE, SCHWALBE D. Date of Service: 12/26/2017 2:45 PM Medical Record Number: [id number] Patient Account Number: [id number] Date of Birth/Sex: December 12, 1934 (83 y.o. Female) Treating RN: Carolyne Fiscal, Debi Primary Care Raylan Hanton: Ceasar Lund Other Clinician: Referring Herold Salguero: Ceasar Lund Treating Brighten Buzzelli/Extender: Melburn Hake, HOYT Weeks  in Treatment: 0 Active Problems Location of Pain Severity and Description of Pain Patient Has Paino No Site Locations Pain Management  and Medication Current Pain Management: Electronic Signature(s) Signed: 12/26/2017 5:41:08 PM By: Alric Quan Entered By: Alric Quan on 12/26/2017 15:04:31 Yetman, Gaye D. ([id number]) -------------------------------------------------------------------------------- Patient/Caregiver Education Details Patient Name: [name] Date of Service: 12/26/2017 2:45 PM Medical Record Number: [id number] Patient Account Number: [id number] Date of Birth/Gender: 26-Oct-1934 (83 y.o. Female) Treating RN: Montey Hora Primary Care Physician: Ceasar Lund Other Clinician: Referring Physician: Ceasar Lund Treating Physician/Extender: Sharalyn Ink in Treatment: 0 Education Assessment Education Provided To: Patient Education Topics Provided Wound/Skin Impairment: Handouts: Other: what to do if bleeding occurs Methods: Explain/Verbal Responses: State content correctly Electronic Signature(s) Signed: 12/26/2017 5:53:18 PM By: Montey Hora Entered By: Montey Hora on 12/26/2017 17:10:48 Bowen, Yamaira D. ([id number]) -------------------------------------------------------------------------------- Wound Assessment Details Patient Name: [name] Date of Service: 12/26/2017 2:45 PM Medical Record Number: [id number] Patient Account Number: [id number] Date of Birth/Sex: August 04, 1934 (83 y.o. Female) Treating RN: Carolyne Fiscal, Debi Primary Care Veatrice Eckstein: Ceasar Lund Other Clinician: Referring Keyleigh Manninen: Ceasar Lund Treating Aydee Mcnew/Extender: STONE III, HOYT Weeks in Treatment: 0 Wound Status Wound Number: 1 Primary Trauma, Other Etiology: Wound Location: Left Lower Leg - Anterior Wound Open Wounding Event: Trauma Status: Date Acquired: 12/05/2017 Comorbid Cataracts, Asthma, Arrhythmia,  Congestive Weeks Of Treatment: 0 History: Heart Failure, Hypertension, Osteoarthritis Clustered Wound: No Photos Photo Uploaded By: Alric Quan on 12/26/2017 17:46:40 Wound Measurements Length: (cm) 0.8 Width: (cm) 1.3 Depth: (cm) 1.1 Area: (cm) 0.817 Volume: (cm) 0.898 % Reduction in Area: % Reduction in Volume: Epithelialization: None Tunneling: No Undermining: Yes Starting Position (o'clock): 12 Ending Position (o'clock): 12 Maximum Distance: (cm) 1.9 Wound Description Full Thickness Without Exposed Support Classification: Structures Wound Margin: Distinct, outline attached Exudate Large Amount: Exudate Type: Sanguinous Exudate Color: red Foul Odor After Cleansing: No Slough/Fibrino No Wound Bed Granulation Amount: Large (67-100%) Exposed Structure Granulation Quality: Red Fascia Exposed: No Necrotic Amount: Small (1-33%) Fat Layer (Subcutaneous Tissue) Exposed: No Seppala, Nataleah D. ([id number]) Necrotic Quality: Eschar Tendon Exposed: No Muscle Exposed: No Joint Exposed: No Bone Exposed: No Periwound Skin Texture Texture Color No Abnormalities Noted: No No Abnormalities Noted: No Erythema: Yes Moisture Erythema Location: Circumferential No Abnormalities Noted: No Temperature / Pain Temperature: No Abnormality Tenderness on Palpation: Yes Wound Preparation Ulcer Cleansing: Rinsed/Irrigated with Saline Topical Anesthetic Applied: Other: LIDOCAINE 4%, Treatment Notes Wound #1 (Left, Anterior Lower Leg) 1. Cleansed with: Clean wound with Normal Saline 2. Anesthetic Topical Lidocaine 4% cream to wound bed prior to debridement 2% Lidocaine injectible without epinephrine prior to debridement 4. Dressing Applied: Calcium Alginate with Silver 5. Secondary Dressing Applied ABD Pad Kerlix/Conform Notes silvercel, drawtex Electronic Signature(s) Signed: 12/26/2017 5:41:08 PM By: Alric Quan Entered By: Alric Quan on 12/26/2017  15:42:15 Girtman, Deshannon D. ([id number]) -------------------------------------------------------------------------------- Vitals Details Patient Name: [name] Date of Service: 12/26/2017 2:45 PM Medical Record Number: [id number] Patient Account Number: [id number] Date of Birth/Sex: 08/12/1934 (83 y.o. Female) Treating RN: Carolyne Fiscal, Debi Primary Care Jashua Knaak: Ceasar Lund Other Clinician: Referring Milianna Ericsson: Ceasar Lund Treating Rashan Rounsaville/Extender: STONE III, HOYT Weeks in Treatment: 0 Vital Signs Time Taken: 15:04 Temperature (F): 98.1 Height (in): 61 Pulse (bpm): 65 Source: Stated Respiratory Rate (breaths/min): 18 Weight (lbs): 179 Blood Pressure (mmHg): 126/65 Source: Stated Reference Range: 80 - 120 mg / dl Body Mass Index (BMI): 33.8 Electronic Signature(s) Signed: 12/26/2017 5:41:08 PM By: Alric Quan Entered By: Alric Quan on 12/26/2017 15:06:01

## 2017-12-27 NOTE — Progress Notes (Signed)
DIERDRE, MCCALIP ([id number]) Visit Report for 12/26/2017 Abuse/Suicide Risk Screen Details Patient Name: Misty Berry, Misty Berry. Date of Service: 12/26/2017 2:45 PM Medical Record Number: [id number] Patient Account Number: [id number] Date of Birth/Sex: 20-Mar-1935 (83 y.o. Female) Treating RN: Carolyne Fiscal, Debi Primary Care Adom Schoeneck: Ceasar Lund Other Clinician: Referring Balen Woolum: Ceasar Lund Treating Riggin Cuttino/Extender: STONE III, HOYT Weeks in Treatment: 0 Abuse/Suicide Risk Screen Items Answer ABUSE/SUICIDE RISK SCREEN: Has anyone close to you tried to hurt or harm you recentlyo No Do you feel uncomfortable with anyone in your familyo No Has anyone forced you do things that you didnot want to doo No Do you have any thoughts of harming yourselfo No Patient displays signs or symptoms of abuse and/or neglect. No Electronic Signature(s) Signed: 12/26/2017 5:41:08 PM By: Alric Quan Entered By: Alric Quan on 12/26/2017 15:21:49 Engebretson, Tyrea D. ([id number]) -------------------------------------------------------------------------------- Activities of Daily Living Details Patient Name: Berry, Misty D. Date of Service: 12/26/2017 2:45 PM Medical Record Number: [id number] Patient Account Number: [id number] Date of Birth/Sex: 1935/02/25 (83 y.o. Female) Treating RN: Carolyne Fiscal, Debi Primary Care Jaine Estabrooks: Ceasar Lund Other Clinician: Referring Malvin Morrish: Ceasar Lund Treating Peniel Hass/Extender: Melburn Hake, HOYT Weeks in Treatment: 0 Activities of Daily Living Items Answer Activities of Daily Living (Please select one for each item) Drive Automobile Completely Able Take Medications Completely Able Use Telephone Completely Able Care for Appearance Completely Able Use Toilet Completely Able Bath / Shower Completely Able Dress Self Completely Able Feed Self Completely Able Walk Completely Able Get In / Out Bed Completely Able Housework  Completely Able Prepare Meals Completely Able Handle Money Completely Able Shop for Self Completely Able Electronic Signature(s) Signed: 12/26/2017 5:41:08 PM By: Alric Quan Entered By: Alric Quan on 12/26/2017 15:22:10 Hilyard, Felishia D. ([id number]) -------------------------------------------------------------------------------- Education Assessment Details Patient Name: Misty Misty D. Date of Service: 12/26/2017 2:45 PM Medical Record Number: [id number] Patient Account Number: [id number] Date of Birth/Sex: 04/27/1935 (83 y.o. Female) Treating RN: Carolyne Fiscal, Debi Primary Care Coby Antrobus: Ceasar Lund Other Clinician: Referring Thomasa Heidler: Ceasar Lund Treating Stellah Donovan/Extender: Melburn Hake, HOYT Weeks in Treatment: 0 Primary Learner Assessed: Patient Learning Preferences/Education Level/Primary Language Learning Preference: Explanation, Printed Material Highest Education Level: College or Above Preferred Language: English Cognitive Barrier Assessment/Beliefs Language Barrier: No Translator Needed: No Memory Deficit: No Emotional Barrier: No Cultural/Religious Beliefs Affecting Medical Care: No Physical Barrier Assessment Impaired Vision: Yes Glasses, 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 Impaired Hearing: No Decreased Hand dexterity: No Knowledge/Comprehension Assessment Knowledge Level: Medium Comprehension Level: Medium Ability to understand written Medium instructions: Ability to understand verbal Medium instructions: Motivation Assessment Anxiety Level: Calm Cooperation: Cooperative Education Importance: Acknowledges Need Interest in Health Problems: Asks Questions Perception: Coherent Willingness to Engage in Self- Medium Management Activities: Readiness to Engage in Self- Medium Management Activities: Electronic Signature(s) Signed: 12/26/2017 5:41:08 PM By: Alric Quan Entered By: Alric Quan on 12/26/2017 15:22:45 Pangborn, Verlinda D.  ([id number]) -------------------------------------------------------------------------------- Fall Risk Assessment Details Patient Name: Misty Misty D. Date of Service: 12/26/2017 2:45 PM Medical Record Number: [id number] Patient Account Number: [id number] Date of Birth/Sex: 05/08/1935 (83 y.o. Female) Treating RN: Carolyne Fiscal, Debi Primary Care Edwina Grossberg: Ceasar Lund Other Clinician: Referring Anjolina Byrer: Ceasar Lund Treating Khyson Sebesta/Extender: Melburn Hake, HOYT Weeks in Treatment: 0 Fall Risk Assessment Items Have you had 2 or more falls in the last 12 monthso 0 No Have you had any fall that resulted in injury in the last 12 monthso 0 No FALL RISK ASSESSMENT: History of falling - immediate or within 3 months 0 No Secondary diagnosis 15 Yes Ambulatory aid None/bed rest/wheelchair/nurse  0 No Crutches/cane/walker 15 Yes Furniture 0 No IV Access/Saline Lock 0 No Gait/Training Normal/bed rest/immobile 0 No Weak 0 No Impaired 20 Yes Mental Status Oriented to own ability 0 Yes Electronic Signature(s) Signed: 12/26/2017 5:41:08 PM By: Alric Quan Entered By: Alric Quan on 12/26/2017 15:23:29 Ribble, Kayda D. ([id number]) -------------------------------------------------------------------------------- Foot Assessment Details Patient Name: Berry, Misty D. Date of Service: 12/26/2017 2:45 PM Medical Record Number: [id number] Patient Account Number: [id number] Date of Birth/Sex: 1934-06-20 (83 y.o. Female) Treating RN: Carolyne Fiscal, Debi Primary Care Edsel Shives: Ceasar Lund Other Clinician: Referring Saesha Llerenas: Ceasar Lund Treating Delila Kuklinski/Extender: Melburn Hake, HOYT Weeks in Treatment: 0 Foot Assessment Items Site Locations + = Sensation present, - = Sensation absent, C = Callus, U = Ulcer R = Redness, W = Warmth, M = Maceration, PU = Pre-ulcerative lesion F = Fissure, S = Swelling, D = Dryness Assessment Right: Left: Other Deformity:  No No Prior Foot Ulcer: No No Prior Amputation: No No Charcot Joint: No No Ambulatory Status: Ambulatory With Help Assistance Device: Walker Gait: Steady Electronic Signature(s) Signed: 12/26/2017 5:41:08 PM By: Alric Quan Entered By: Alric Quan on 12/26/2017 15:24:40 Fedorko, Lynnelle D. ([id number]) -------------------------------------------------------------------------------- Nutrition Risk Assessment Details Patient Name: NARGIS, ABRAMS D. Date of Service: 12/26/2017 2:45 PM Medical Record Number: [id number] Patient Account Number: [id number] Date of Birth/Sex: 1935/04/26 (83 y.o. Female) Treating RN: Carolyne Fiscal, Debi Primary Care Marquin Patino: Ceasar Lund Other Clinician: Referring Mahkai Fangman: Ceasar Lund Treating Consuelo Thayne/Extender: STONE III, HOYT Weeks in Treatment: 0 Height (in): 61 Weight (lbs): 179 Body Mass Index (BMI): 33.8 Nutrition Risk Assessment Items NUTRITION RISK SCREEN: I have an illness or condition that made me change the kind and/or amount of 0 No food I eat I eat fewer than two meals per day 3 Yes I eat few fruits and vegetables, or milk products 0 No I have three or more drinks of beer, liquor or wine almost every day 0 No I have tooth or mouth problems that make it hard for me to eat 0 No I don't always have enough money to buy the food I need 0 No I eat alone most of the time 0 No I take three or more different prescribed or over-the-counter drugs a day 1 Yes Without wanting to, I have lost or gained 10 pounds in the last six months 0 No I am not always physically able to shop, cook and/or feed myself 0 No Nutrition Protocols Good Risk Protocol Moderate Risk Protocol Electronic Signature(s) Signed: 12/26/2017 5:41:08 PM By: Alric Quan Entered By: Alric Quan on 12/26/2017 15:23:43

## 2017-12-27 NOTE — Progress Notes (Signed)
[name], [name] ([id number]) Visit Report for 12/26/2017 Chief Complaint Document Details Patient Name: Misty Berry, Misty Berry. Date of Service: 12/26/2017 2:45 PM Medical Record Number: [id number] Patient Account Number: [id number] Date of Birth/Sex: [birth year] (83 y.o. Female) Treating RN: Misty Berry Primary Care Provider: Ceasar Berry Other Clinician: Referring Provider: Ceasar Berry Treating Provider/Extender: Misty Berry, Misty Berry Weeks in Treatment: 0 Information Obtained from: Patient Chief Complaint Left anterior shin abscess with ulcer Electronic Signature(s) Signed: 12/27/2017 7:38:16 AM By: Worthy Keeler PA-C Entered By: Worthy Berry on 12/27/2017 07:14:08 Hidalgo, Misty D. ([id number]) -------------------------------------------------------------------------------- Debridement Details Patient Name: Misty Drape D. Date of Service: 12/26/2017 2:45 PM Medical Record Number: [id number] Patient Account Number: [id number] Date of Birth/Sex: 05-22-1935 (83 y.o. Female) Treating RN: Montey Hora Primary Care Provider: Ceasar Berry Other Clinician: Referring Provider: Ceasar Berry Treating Provider/Extender: Misty Berry, Misty Berry Weeks in Treatment: 0 Debridement Performed for Wound #1 Left,Anterior Lower Leg Assessment: Performed By: Physician STONE III, Misty Proudfoot E., PA-C Debridement Type: Debridement Pre-procedure Verification/Time Yes - 16:11 Out Taken: Start Time: 16:11 Pain Control: Lidocaine Injectable : 2 Total Area Debrided (L x W): 0.8 (cm) x 1.3 (cm) = 1.04 (cm) Tissue and other material Viable, Non-Viable, Slough, Subcutaneous, Biofilm, Slough, Other: necrotic muscle debrided: Level: Skin/Subcutaneous Tissue Debridement Description: Excisional Instrument: Blade, Curette Bleeding: Large Hemostasis Achieved: Silver Nitrate End Time: 16:17 Procedural Pain: 0 Post Procedural Pain: 0 Response to Treatment: Procedure was  tolerated well Level of Consciousness: Awake and Alert Post Debridement Measurements of Total Wound Length: (cm) 0.8 Width: (cm) 1.3 Depth: (cm) 1.1 Volume: (cm) 0.898 Character of Wound/Ulcer Post Debridement: Stable Post Procedure Diagnosis Same as Pre-procedure Electronic Signature(s) Signed: 12/26/2017 5:53:18 PM By: Montey Hora Signed: 12/27/2017 7:38:16 AM By: Worthy Keeler PA-C Entered By: Montey Hora on 12/26/2017 16:52:14 Misty Berry, Misty D. ([id number]) -------------------------------------------------------------------------------- HPI Details Patient Name: Misty Drape D. Date of Service: 12/26/2017 2:45 PM Medical Record Number: [id number] Patient Account Number: [id number] Date of Birth/Sex: 1935/02/01 (83 y.o. Female) Treating RN: Misty Berry Primary Care Provider: Ceasar Berry Other Clinician: Referring Provider: Ceasar Berry Treating Provider/Extender: Misty Berry, Misty Berry Weeks in Treatment: 0 History of Present Illness HPI Description: 12/26/17 patient presents today with a past medical history positive for congestive heart failure, hypertension, bilateral lower extremity edema, venous insufficiency, and unfortunately for the past three weeks she has been dealing with the left anterior lower extremity dramatic wound after hitting her leg on the bottom of a free car door frame. This occurred roughly 3 weeks ago and patient states that she went to urgent care the same day where they performed in incision and drainage cleaning out quite a bit in the way of blood clots. However they did not recommend packing the wound and since that time she has developed a built-up for their blood clots in the region. Unfortunately this continues to give her problems she was given clindamycin although at this point it appeared that she may have an infection that is definitely your theme of noted currently. There does not appear to be any evidence of systemic  infection which is good news. On evaluation today patient does have discomfort although it's many with touching over the area and again it's again even mainly with specifically squeezing or attempting to get any of the blood clots out of the wound. There appears to be two small openings although that connects and there is one central opening with asymptomatic undermining underlying this. Nonetheless it appears this likely is going to need to be  open board in order to see this fully and completely heal appropriately. No fevers, chills, nausea, or vomiting noted at this time. Electronic Signature(s) Signed: 12/27/2017 7:38:16 AM By: Worthy Keeler PA-C Entered By: Worthy Berry on 12/27/2017 07:14:23 [place], [name] ([id number]) -------------------------------------------------------------------------------- Physical Exam Details Patient Name: AMILLION, [name] Date of Service: 12/26/2017 2:45 PM Medical Record Number: [id number] Patient Account Number: [id number] Date of Birth/Sex: 07/17/1934 (83 y.o. Female) Treating RN: Misty Berry Primary Care Provider: Ceasar Berry Other Clinician: Referring Provider: Ceasar Berry Treating Provider/Extender: STONE III, Chaselynn Kepple Weeks in Treatment: 0 Constitutional sitting or standing blood pressure is within target range for patient.. pulse regular and within target range for patient.Marland Kitchen respirations regular, non-labored and within target range for patient.Marland Kitchen temperature within target range for patient.. Well- nourished and well-hydrated in no acute distress. Eyes conjunctiva clear no eyelid edema noted. pupils equal round and reactive to light and accommodation. Ears, Nose, Mouth, and Throat no gross abnormality of ear auricles or external auditory canals. normal hearing noted during conversation. mucus membranes moist. Respiratory normal breathing without difficulty. clear to auscultation bilaterally. Cardiovascular regular  rate and rhythm with normal S1, S2. 2+ dorsalis pedis/posterior tibialis pulses. no clubbing, cyanosis, significant edema, <3 sec cap refill. Gastrointestinal (GI) soft, non-tender, non-distended, +BS. no ventral hernia noted. Musculoskeletal normal gait and posture. no significant deformity or arthritic changes, no loss or range of motion, no clubbing. Psychiatric this patient is able to make decisions and demonstrates good insight into disease process. Alert and Oriented x 3. pleasant and cooperative. Notes On inspection today again patient had a opening on the left anterior shin that has two small puncture/slit openings and there's a bridge connecting them of tissue in between. However there is significant undermining underlying this and I do believe that's going to require opening in order to both clean this region now as well as allow for appropriate healing and drainage of the abscess. Right now it seems to be more of a hematoma even then abscess. This was actually perform after numbing the patient with lidocaine and following the numbing procedure she tolerated everything else with no discomfort. Despite the fact she is not on any chronic blood thinners other than a baby aspirin she did have significant bleeding following the procedure. I had to utilize several silver nitrate sticks in order to completely cauterize to bleeding regions of proximal and distal in the wound bed. She tolerated this without complication and in the end good hemostasis was obtained and the wound was packed with silver alginate without anything bleeding through. Electronic Signature(s) Signed: 12/27/2017 7:38:16 AM By: Worthy Keeler PA-C Entered By: Worthy Berry on 12/27/2017 07:16:40 Misty Berry, Misty D. ([id number]) -------------------------------------------------------------------------------- Physician Orders Details Patient Name: CASSIDIE, VEIGA D. Date of Service: 12/26/2017 2:45 PM Medical Record Number:  [id number] Patient Account Number: [id number] Date of Birth/Sex: [birth year] (83 y.o. Female) Treating RN: Misty Berry Primary Care Provider: Ceasar Berry Other Clinician: Referring Provider: Ceasar Berry Treating Provider/Extender: STONE III, Kalifa Cadden Weeks in Treatment: 0 Verbal / Phone Orders: No Diagnosis Coding Wound Cleansing Wound #1 Left,Anterior Lower Leg o Clean wound with Normal Saline. Anesthetic (add to Medication List) Wound #1 Left,Anterior Lower Leg o Topical Lidocaine 4% cream applied to wound bed prior to debridement (In Clinic Only). o Injected 2% Xylocaine MPF prior to debridement (In Clinic Only). Primary Wound Dressing Wound #1 Left,Anterior Lower Leg o Silver Alginate - pack into wound Secondary Dressing Wound #1 Left,Anterior Lower Leg o ABD pad o  Drawtex Dressing Change Frequency Wound #1 Left,Anterior Lower Leg o Change Dressing Monday, Wednesday, Friday Follow-up Appointments Wound #1 Left,Anterior Lower Leg o Return Appointment in 1 week. Edema Control Wound #1 Left,Anterior Lower Leg o Other: - kerlix wrap. Home Health Wound #1 [facility] Nurse may visit PRN to address patientos wound care needs. o FACE TO FACE ENCOUNTER: MEDICARE and MEDICAID PATIENTS: I certify that this patient is under my care and that I had a face-to-face encounter that meets the physician face-to-face encounter requirements with this patient on this date. The encounter with the patient was in whole or in part for the following MEDICAL CONDITION: (primary reason for [place]) MEDICAL NECESSITY: I certify, that based on my findings, NURSING services are a medically necessary home health service. HOME BOUND STATUS: I certify that my clinical findings support that this patient is homebound (i.e., Due to illness or injury, pt requires aid of supportive devices  such as crutches, cane, wheelchairs, walkers, the use of special transportation or the assistance of another person to leave their place of residence. There is a normal inability to leave the home [place], [place]. ([id number]) and doing so requires considerable and taxing effort. Other absences are for medical reasons / religious services and are infrequent or of short duration when for other reasons). o If current dressing causes regression in wound condition, may D/C ordered dressing product/s and apply Normal Saline Moist Dressing daily until next [place] / Other MD appointment. [place] of regression in wound condition at [phone]. o Please direct any NON-WOUND related issues/requests for orders to patient's Primary Care Physician Laboratory o Bacteria identified in Wound by Culture (MICRO) - left lower leg oooo LOINC Code: 5366-4 oooo Convenience Name: Wound culture routine Patient Medications Allergies: methylprednisolone, peanut, Iodinated Contrast- Oral and IV Dye, loxaglate sodium, Solu-Medrol, vancomycin Notifications Medication Indication Start End doxycycline hyclate 12/26/2017 DOSE 1 - oral 100 mg capsule - 1 capsule oral taken 2 times a day for 10 days Electronic Signature(s) Signed: 12/26/2017 4:32:50 PM By: Worthy Keeler PA-C Entered By: Worthy Berry on 12/26/2017 16:32:49 Misty Berry, Misty D. ([id number]) -------------------------------------------------------------------------------- Problem List Details Patient Name: Misty Drape D. Date of Service: 12/26/2017 2:45 PM Medical Record Number: [id number] Patient Account Number: [id number] Date of Birth/Sex: 26-Jul-1934 (83 y.o. Female) Treating RN: Misty Berry Primary Care Provider: Ceasar Berry Other Clinician: Referring Provider: Ceasar Berry Treating Provider/Extender: Misty Berry, Karolyna Bianchini Weeks in Treatment: 0 Active Problems ICD-10 Evaluated  Encounter Code Description Active Date Today Diagnosis L02.416 Cutaneous abscess of left lower limb 12/27/2017 No Yes L97.822 Non-pressure chronic ulcer of other part of left lower leg with 12/27/2017 No Yes fat layer exposed I87.2 Venous insufficiency (chronic) (peripheral) 12/27/2017 No Yes P29.51 Chronic systolic (congestive) heart failure 12/27/2017 No Yes I10 Essential (primary) hypertension 12/27/2017 No Yes Inactive Problems Resolved Problems Electronic Signature(s) Signed: 12/27/2017 7:38:16 AM By: Worthy Keeler PA-C Entered By: Worthy Berry on 12/27/2017 07:13:25 Misty Berry, Misty D. ([id number]) -------------------------------------------------------------------------------- Progress Note Details Patient Name: Misty Drape D. Date of Service: 12/26/2017 2:45 PM Medical Record Number: [id number] Patient Account Number: [id number] Date of Birth/Sex: 03/10/1935 (83 y.o. Female) Treating RN: Misty Berry Primary Care Provider: Ceasar Berry Other Clinician: Referring Provider: Ceasar Berry Treating Provider/Extender: Misty Berry, Tymeshia Awan Weeks in Treatment: 0 Subjective Chief Complaint Information obtained from Patient Left anterior shin abscess with ulcer History of Present Illness (HPI) 12/26/17 patient presents today  with a past medical history positive for congestive heart failure, hypertension, bilateral lower extremity edema, venous insufficiency, and unfortunately for the past three weeks she has been dealing with the left anterior lower extremity dramatic wound after hitting her leg on the bottom of a free car door frame. This occurred roughly 3 weeks ago and patient states that she went to urgent care the same day where they performed in incision and drainage cleaning out quite a bit in the way of blood clots. However they did not recommend packing the wound and since that time she has developed a built-up for their blood clots in the region.  Unfortunately this continues to give her problems she was given clindamycin although at this point it appeared that she may have an infection that is definitely your theme of noted currently. There does not appear to be any evidence of systemic infection which is good news. On evaluation today patient does have discomfort although it's many with touching over the area and again it's again even mainly with specifically squeezing or attempting to get any of the blood clots out of the wound. There appears to be two small openings although that connects and there is one central opening with asymptomatic undermining underlying this. Nonetheless it appears this likely is going to need to be open board in order to see this fully and completely heal appropriately. No fevers, chills, nausea, or vomiting noted at this time. Wound History Patient presents with 1 open wound that has been present for approximately 3 weeks. Patient has been treating wound in the following manner: prescription cream (unknown). Laboratory tests have not been performed in the last month. Patient reportedly has not tested positive for an antibiotic resistant organism. Patient reportedly has not tested positive for osteomyelitis. Patient reportedly has not had testing performed to evaluate circulation in the legs. Patient experiences the following problems associated with their wounds: swelling. Patient History Information obtained from Patient. Allergies methylprednisolone, peanut, Iodinated Contrast- Oral and IV Dye, loxaglate sodium, Solu-Medrol, vancomycin Family History Heart Disease - Father, No family history of Cancer, Diabetes, Hereditary Spherocytosis, Hypertension, Kidney Disease, Lung Disease, Seizures, Stroke, Thyroid Problems, Tuberculosis. Social History Former smoker - quit 1972, Marital Status - Divorced, Alcohol Use - Never, Drug Use - No History, Caffeine Use - Never. Medical History Eyes Patient has  history of Cataracts - surgery Respiratory Dascoli, TANDI HANKO. ([id number]) Patient has history of Asthma Cardiovascular Patient has history of Arrhythmia - hx atrial tachycardia, Congestive Heart Failure, Hypertension Endocrine Denies history of Type I Diabetes, Type II Diabetes Musculoskeletal Patient has history of Osteoarthritis Review of Systems (ROS) Constitutional Symptoms (General Health) The patient has no complaints or symptoms. 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 Cardiovascular venous insufficiency bilateral legs Gastrointestinal The patient has no complaints or symptoms. Endocrine Complains or has symptoms of Thyroid disease. Denies complaints or symptoms of Hepatitis, Polydypsia (Excessive Thirst). Genitourinary Complains or has symptoms of Incontinence/dribbling - stage 4 kidney disease. Immunological The patient has no complaints or symptoms. Integumentary (Skin) Complains or has symptoms of Swelling - legs, hx of cellulitis Musculoskeletal osteoporosis Neurologic The patient has no complaints or symptoms. Oncologic The patient has no complaints or symptoms. Psychiatric Complains or has symptoms of Anxiety, depression Objective Constitutional sitting or standing blood pressure is within target range for patient.. pulse regular and within target range for patient.Marland Kitchen respirations regular, non-labored  and within target range for patient.Marland Kitchen temperature within target range for patient.. Well- nourished and well-hydrated in no acute distress. Vitals Time Taken: 3:04 PM, Height: 61 in, Source: Stated, Weight: 179 lbs, Source: Stated, BMI: 33.8, Temperature: 98.1 F, Pulse: 65 bpm, Respiratory Rate: 18 breaths/min, Blood Pressure: 126/65 mmHg. Eyes conjunctiva clear no eyelid edema noted. pupils equal round and reactive to light and  accommodation. [place], Zianne D. ([id number]) Ears, Nose, Mouth, and Throat no gross abnormality of ear auricles or external auditory canals. normal hearing noted during conversation. mucus membranes moist. Respiratory normal breathing without difficulty. clear to auscultation bilaterally. Cardiovascular regular rate and rhythm with normal S1, S2. 2+ dorsalis pedis/posterior tibialis pulses. no clubbing, cyanosis, significant edema, Gastrointestinal (GI) soft, non-tender, non-distended, +BS. no ventral hernia noted. Musculoskeletal normal gait and posture. no significant deformity or arthritic changes, no loss or range of motion, no clubbing. Psychiatric this patient is able to make decisions and demonstrates good insight into disease process. Alert and Oriented x 3. pleasant and cooperative. General Notes: On inspection today again patient had a opening on the left anterior shin that has two small puncture/slit openings and there's a bridge connecting them of tissue in between. However there is significant undermining underlying this and I do believe that's going to require opening in order to both clean this region now as well as allow for appropriate healing and drainage of the abscess. Right now it seems to be more of a hematoma even then abscess. This was actually perform after numbing the patient with lidocaine and following the numbing procedure she tolerated everything else with no discomfort. Despite the fact she is not on any chronic blood thinners other than a baby aspirin she did have significant bleeding following the procedure. I had to utilize several silver nitrate sticks in order to completely cauterize to bleeding regions of proximal and distal in the wound bed. She tolerated this without complication and in the end good hemostasis was obtained and the wound was packed with silver alginate without anything bleeding through. Integumentary (Hair, Skin) Wound #1 status is  Open. Original cause of wound was Trauma. The wound is located on the Left,Anterior Lower Leg. The wound measures 0.8cm length x 1.3cm width x 1.1cm depth; 0.817cm^2 area and 0.898cm^3 volume. There is no tunneling noted, however, there is undermining starting at 12:00 and ending at 12:00 with a maximum distance of 1.9cm. There is a large amount of sanguinous drainage noted. The wound margin is distinct with the outline attached to the wound base. There is large (67-100%) red granulation within the wound bed. There is a small (1-33%) amount of necrotic tissue within the wound bed including Eschar. The periwound skin appearance exhibited: Erythema. The surrounding wound skin color is noted with erythema which is circumferential. Periwound temperature was noted as No Abnormality. The periwound has tenderness on palpation. Assessment Active Problems ICD-10 Cutaneous abscess of left lower limb Non-pressure chronic ulcer of other part of left lower leg with fat layer exposed Venous insufficiency (chronic) (peripheral) Chronic systolic (congestive) heart failure Essential (primary) hypertension Misty Berry, Misty D. ([id number]) Procedures Wound #1 Pre-procedure diagnosis of Wound #1 is a Trauma, Other located on the Left,Anterior Lower Leg . There was a Excisional Skin/Subcutaneous Tissue Debridement with a total area of 1.04 sq cm performed by STONE III, Sherrie Marsan E., PA-C. With the following instrument(s): Blade, and Curette to remove Viable and Non-Viable tissue/material. Material removed includes Subcutaneous Tissue, Slough, Biofilm, and Other: necrotic muscle after achieving pain control using  Lidocaine Injectable: 2%. No specimens were taken. A time out was conducted at 16:11, prior to the start of the procedure. A Large amount of bleeding was controlled with Silver Nitrate. The procedure was tolerated well with a pain level of 0 throughout and a pain level of 0 following the procedure. Patient s  Level of Consciousness post procedure was recorded as Awake and Alert. Post Debridement Measurements: 0.8cm length x 1.3cm width x 1.1cm depth; 0.898cm^3 volume. Character of Wound/Ulcer Post Debridement is stable. Post procedure Diagnosis Wound #1: Same as Pre-Procedure Plan Wound Cleansing: Wound #1 Left,Anterior Lower Leg: Clean wound with Normal Saline. Anesthetic (add to Medication List): Wound #1 Left,Anterior Lower Leg: Topical Lidocaine 4% cream applied to wound bed prior to debridement (In Clinic Only). Injected 2% Xylocaine MPF prior to debridement (In Clinic Only). Primary Wound Dressing: Wound #1 Left,Anterior Lower Leg: Silver Alginate - pack into wound Secondary Dressing: Wound #1 Left,Anterior Lower Leg: ABD pad Drawtex Dressing Change Frequency: Wound #1 Left,Anterior Lower Leg: Change Dressing Monday, Wednesday, Friday Follow-up Appointments: Wound #1 Left,Anterior Lower Leg: Return Appointment in 1 week. Edema Control: Wound #1 Left,Anterior Lower Leg: Other: - kerlix wrap. Home Health: Wound #1 Left,Anterior Lower Leg: [place] [facility] Nurse may visit PRN to address patient s wound care needs. FACE TO FACE ENCOUNTER: MEDICARE and MEDICAID PATIENTS: I certify that this patient is under my care and that I had a face-to-face encounter that meets the physician face-to-face encounter requirements with this patient on this date. The encounter with the patient was in whole or in part for the following MEDICAL CONDITION: (primary reason for [place]) MEDICAL NECESSITY: I certify, that based on my findings, NURSING services are a medically necessary home health service. HOME BOUND STATUS: I certify that my clinical findings support that this patient is homebound (i.e., Due to illness or injury, pt requires aid of supportive devices such as crutches, cane, wheelchairs, walkers, the use of special transportation or the  assistance of another person to leave their place of residence. There is a normal inability to leave the home and doing so requires considerable and taxing effort. Other absences are for medical reasons / religious services and are infrequent or of short duration when for other reasons). Misty Berry, Misty D. ([id number]) If current dressing causes regression in wound condition, may D/C ordered dressing product/s and apply Normal Saline Moist Dressing daily until next [place] / Other MD appointment. [place] of regression in wound condition at [phone]. Please direct any NON-WOUND related issues/requests for orders to patient's Primary Care Physician Laboratory ordered were: Wound culture routine - left lower leg The following medication(s) was prescribed: doxycycline hyclate oral 100 mg capsule 1 1 capsule oral taken 2 times a day for 10 days starting 12/26/2017 At this point my hope is that the patient will actually do very well after this debridement in that the wound will be able to start healing appropriately. Need to have the packing change to were going to work on getting home health set up as well in this regard. I did go ahead and give her a prescription for doxycycline and a wound culture was also obtained and will be tested as far as since the bees are concerned to ensure that it's an appropriate antibiotic for her. Patients in agreement with this plan. We will subsequently see were things stand at follow-up see her back in one weeks time. Please see above for specific wound  care orders. We will see patient for re-evaluation in 1 week(s) here in the clinic. If anything worsens or changes patient will contact our office for additional recommendations. Electronic Signature(s) Signed: 12/27/2017 7:38:16 AM By: Worthy Keeler PA-C Entered By: Worthy Berry on 12/27/2017 07:17:40 Macon, Rori D.  ([id number]) -------------------------------------------------------------------------------- ROS/PFSH Details Patient Name: Misty Berry, GAIL D. Date of Service: 12/26/2017 2:45 PM Medical Record Number: [id number] Patient Account Number: [id number] Date of Birth/Sex: [birth year] (83 y.o. Female) Treating RN: Carolyne Fiscal, Debi Primary Care Provider: Ceasar Berry Other Clinician: Referring Provider: Ceasar Berry Treating Provider/Extender: Misty Berry, Blondine Hottel Weeks in Treatment: 0 Information Obtained From Patient Wound History Do you currently have one or more open woundso Yes How many open wounds do you currently haveo 1 Approximately how long have you had your woundso 3 weeks How have you been treating your wound(s) until nowo prescription cream (unknown) Has your wound(s) ever healed and then re-openedo No Have you had any lab work done in the past montho No Have you tested positive for an antibiotic resistant organism (MRSA, VRE)o No Have you tested positive for osteomyelitis (bone infection)o No Have you had any tests for circulation on your legso No Have you had other problems associated with your woundso Swelling Eyes Complaints and Symptoms: Positive for: Glasses / Contacts Review of System Notes: 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 ([id number]) Review of System Notes: depression Constitutional Symptoms (General  Health) Complaints and Symptoms: No Complaints or Symptoms Hematologic/Lymphatic Complaints and Symptoms: No Complaints or Symptoms Respiratory Medical History: Positive for: Asthma Cardiovascular Complaints and Symptoms: Review of System Notes: venous insufficiency bilateral legs Medical History: Positive for: Arrhythmia - hx atrial tachycardia; Congestive Heart Failure; Hypertension Gastrointestinal Complaints and Symptoms: No Complaints or Symptoms Immunological Complaints and Symptoms: No Complaints or Symptoms Musculoskeletal Complaints and Symptoms: Review of System Notes: osteoporosis Medical History: Positive for: Osteoarthritis Neurologic Complaints and Symptoms: No Complaints or Symptoms Oncologic Complaints and Symptoms: No Complaints or Symptoms HBO Extended History Items Eyes: Cataracts Woon, Antara D. ([id number]) Immunizations Pneumococcal Vaccine: Received Pneumococcal Vaccination: Yes Implantable Devices Family and Social History Cancer: No; Diabetes: No; Heart Disease: Yes - Father; Hereditary Spherocytosis: No; Hypertension: No; Kidney Disease: No; Lung Disease: No; Seizures: No; Stroke: No; Thyroid Problems: No; Tuberculosis: No; Former smoker - quit 1972; Marital Status - Divorced; Alcohol Use: Never; Drug Use: No History; Caffeine Use: Never; Financial Concerns: No; Food, Clothing or Berry Needs: No; Support System Lacking: No; Transportation Concerns: No; Advanced Directives: Yes (Not Provided); Patient does not want information on Advanced Directives; Do not resuscitate: No; Living Will: Yes (Not Provided); Medical Power of Attorney: Yes - [name] (brother) (Not Provided) Electronic Signature(s) Signed: 12/26/2017 5:41:08 PM By: Alric Quan Signed: 12/27/2017 7:38:16 AM By: Worthy Keeler PA-C Entered By: Alric Quan on 12/26/2017 15:21:41 Marion, Adrean D.  ([id number]) -------------------------------------------------------------------------------- SuperBill Details Patient Name: Misty Drape D. Date of Service: 12/26/2017 Medical Record Number: [id number] Patient Account Number: [id number] Date of Birth/Sex: September 23, 1934 (83 y.o. Female) Treating RN: Misty Berry Primary Care Provider: Ceasar Berry Other Clinician: Referring Provider:  POWELL-TILLMAN, LEVONNE Treating Provider/Extender: STONE III, Jahnaya Branscome Weeks in Treatment: 0 Diagnosis Coding ICD-10 Codes Code Description L02.416 Cutaneous abscess of left lower limb L97.822 Non-pressure chronic ulcer of other part of left lower leg with fat layer exposed I87.2 Venous insufficiency (chronic) (peripheral) V01.31 Chronic systolic (congestive) heart failure I10 Essential (primary) hypertension Facility Procedures CPT4 Code Description: 43888757 99212 - WOUND CARE VISIT-LEV 2 EST PT Modifier: Quantity: 1 CPT4 Code Description: [id number] 11042 - DEB SUBQ TISSUE 20 SQ CM/< ICD-10 Diagnosis Description L97.822 Non-pressure chronic ulcer of other part of left lower leg with Modifier: fat layer expos Quantity: 1 ed Physician Procedures CPT4 Code Description: 1561537 94327 - WC PHYS LEVEL 4 - NEW PT ICD-10 Diagnosis Description L02.416 Cutaneous abscess of left lower limb L97.822 Non-pressure chronic ulcer of other part of left lower leg with I87.2 Venous insufficiency (chronic)  (peripheral) M14.70 Chronic systolic (congestive) heart failure Modifier: 25 fat layer expos Quantity: 1 ed CPT4 Code Description: [id number] 11042 - WC PHYS SUBQ TISS 20 SQ CM ICD-10 Diagnosis Description L97.822 Non-pressure chronic ulcer of other part of left lower leg with Modifier: fat layer expos Quantity: 1 ed Electronic Signature(s) Signed: 12/27/2017 7:38:16 AM By: Worthy Keeler PA-C Entered By: Worthy Berry on 12/27/2017 07:18:08

## 2017-12-29 LAB — AEROBIC CULTURE  (SUPERFICIAL SPECIMEN): CULTURE: NO GROWTH

## 2017-12-29 LAB — AEROBIC CULTURE W GRAM STAIN (SUPERFICIAL SPECIMEN)

## 2018-01-02 ENCOUNTER — Encounter: Payer: Medicare Other | Admitting: Physician Assistant

## 2018-01-02 DIAGNOSIS — L02416 Cutaneous abscess of left lower limb: Secondary | ICD-10-CM | POA: Diagnosis not present

## 2018-01-03 NOTE — Progress Notes (Signed)
FALLEN, CRISOSTOMO ([id number]) Visit Report for 01/02/2018 Chief Complaint Document Details Patient Name: Misty Berry, Misty Berry. Date of Service: 01/02/2018 2:30 PM Medical Record Number: [id number] Patient Account Number: [id number] Date of Birth/Sex: 1935-01-09 (83 y.o. F) Treating RN: Roger Shelter Primary Care Provider: Ceasar Lund Other Clinician: Referring Provider: Ceasar Lund Treating Provider/Extender: Melburn Hake, HOYT Weeks in Treatment: 1 Information Obtained from: Patient Chief Complaint Left anterior shin abscess with ulcer Electronic Signature(s) Signed: 01/02/2018 11:53:17 PM By: Worthy Keeler PA-C Entered By: Worthy Keeler on 01/02/2018 14:41:47 Philipp, Roselina D. ([id number]) -------------------------------------------------------------------------------- HPI Details Patient Name: Misty Drape D. Date of Service: 01/02/2018 2:30 PM Medical Record Number: [id number] Patient Account Number: [id number] Date of Birth/Sex: [birth year] (83 y.o. F) Treating RN: Roger Shelter Primary Care Provider: Ceasar Lund Other Clinician: Referring Provider: Ceasar Lund Treating Provider/Extender: Melburn Hake, HOYT Weeks in Treatment: 1 History of Present Illness HPI Description: 12/26/17 patient presents today with a past medical history positive for congestive heart failure, hypertension, bilateral lower extremity edema, venous insufficiency, and unfortunately for the past three weeks she has been dealing with the left anterior lower extremity dramatic wound after hitting her leg on the bottom of a free car door frame. This occurred roughly 3 weeks ago and patient states that she went to urgent care the same day where they performed in incision and drainage cleaning out quite a bit in the way of blood clots. However they did not recommend packing the wound and since that time she has developed a built-up for their blood clots in the region.  Unfortunately this continues to give her problems she was given clindamycin although at this point it appeared that she may have an infection that is definitely your theme of noted currently. There does not appear to be any evidence of systemic infection which is good news. On evaluation today patient does have discomfort although it's many with touching over the area and again it's again even mainly with specifically squeezing or attempting to get any of the blood clots out of the wound. There appears to be two small openings although that connects and there is one central opening with asymptomatic undermining underlying this. Nonetheless it appears this likely is going to need to be open board in order to see this fully and completely heal appropriately. No fevers, chills, nausea, or vomiting noted at this time. 01/02/18 on evaluation today the patient's left lower extremity ulcer appears to be doing significantly better. She did have some Slough on the surface of the wound but this was able to easily be debrided away with saline gauze utilizing a sterile Q-tip. With that being said she did not have any significant bleeding like last time overall I feel like she has tolerated the dressing changes very well. In general I feel like she's making great progress. With that being said I still feel like that she has some healing to go nonetheless but again I'm definitely pleased with the interval change from last week to this week. Electronic Signature(s) Signed: 01/02/2018 11:53:17 PM By: Worthy Keeler PA-C Entered By: Worthy Keeler on 01/02/2018 16:05:13 Misty Berry, Misty D. ([id number]) -------------------------------------------------------------------------------- Physical Exam Details Patient Name: [name], [name] Date of Service: 01/02/2018 2:30 PM Medical Record Number: [id number] Patient Account Number: [id number] Date of Birth/Sex: 05-06-1935 (83 y.o. F) Treating RN: Roger Shelter Primary  Care Provider: Ceasar Lund Other Clinician: Referring Provider: Ceasar Lund Treating Provider/Extender: STONE III, HOYT Weeks in Treatment: 1 Constitutional Well-nourished and well-hydrated in no  acute distress. Respiratory normal breathing without difficulty. clear to auscultation bilaterally. Cardiovascular regular rate and rhythm with normal S1, S2. Psychiatric this patient is able to make decisions and demonstrates good insight into disease process. Alert and Oriented x 3. pleasant and cooperative. Notes Patient's wound bed once it was cleaned well actually show good granulation there does not appear to be any evidence of infection at this time which is excellent news. Overall I feel like she's making great progress at this time. We're gonna recommend that she continue with the same wound care measures. Electronic Signature(s) Signed: 01/02/2018 11:53:17 PM By: Worthy Keeler PA-C Entered By: Worthy Keeler on 01/02/2018 16:05:54 Misty Berry, Misty D. ([id number]) -------------------------------------------------------------------------------- Physician Orders Details Patient Name: MEGGIE, LASETER D. Date of Service: 01/02/2018 2:30 PM Medical Record Number: [id number] Patient Account Number: [id number] Date of Birth/Sex: May 27, 1935 (83 y.o. F) Treating RN: Roger Shelter Primary Care Provider: Ceasar Lund Other Clinician: Referring Provider: Ceasar Lund Treating Provider/Extender: Melburn Hake, HOYT Weeks in Treatment: 1 Verbal / Phone Orders: No Diagnosis Coding ICD-10 Coding Code Description L02.416 Cutaneous abscess of left lower limb L97.822 Non-pressure chronic ulcer of other part of left lower leg with fat layer exposed I87.2 Venous insufficiency (chronic) (peripheral) O75.64 Chronic systolic (congestive) heart failure I10 Essential (primary) hypertension Wound Cleansing Wound #1 Left,Anterior Lower Leg o Clean wound with  Normal Saline. Anesthetic (add to Medication List) Wound #1 Left,Anterior Lower Leg o Topical Lidocaine 4% cream applied to wound bed prior to debridement (In Clinic Only). Primary Wound Dressing Wound #1 Left,Anterior Lower Leg o Silver Alginate - pack into the wound Secondary Dressing Wound #1 Left,Anterior Lower Leg o Conform/Kerlix o Drawtex Dressing Change Frequency Wound #1 Left,Anterior Lower Leg o Change Dressing Monday, Wednesday, Friday Follow-up Appointments Wound #1 Left,Anterior Lower Leg o Return Appointment in 1 week. Edema Control Wound #1 Left,Anterior Lower Leg o Other: - kerlix wrap with strecth net. Home Health Wound #1 [facility] ([id number]) o Home Health Nurse may visit PRN to address patientos wound care needs. o FACE TO FACE ENCOUNTER: MEDICARE and MEDICAID PATIENTS: I certify that this patient is under my care and that I had a face-to-face encounter that meets the physician face-to-face encounter requirements with this patient on this date. The encounter with the patient was in whole or in part for the following MEDICAL CONDITION: (primary reason for [place]) MEDICAL NECESSITY: I certify, that based on my findings, NURSING services are a medically necessary home health service. HOME BOUND STATUS: I certify that my clinical findings support that this patient is homebound (i.e., Due to illness or injury, pt requires aid of supportive devices such as crutches, cane, wheelchairs, walkers, the use of special transportation or the assistance of another person to leave their place of residence. There is a normal inability to leave the home and doing so requires considerable and taxing effort. Other absences are for medical reasons / religious services and are infrequent or of short duration when for other reasons). o If current dressing causes regression in wound  condition, may D/C ordered dressing product/s and apply Normal Saline Moist Dressing daily until next [place] / Other MD appointment. [place] of regression in wound condition at [phone]. o Please direct any NON-WOUND related issues/requests for orders to patient's Primary Care Physician Electronic Signature(s) Signed: 01/02/2018 5:25:05 PM By: Roger Shelter Signed: 01/02/2018 11:53:17 PM By: Worthy Keeler PA-C Entered By:  Roger Shelter on 01/02/2018 15:33:53 Matsumura, BRADEN DELOACH ([id number]) -------------------------------------------------------------------------------- Problem List Details Patient Name: NAOMIE, CROW. Date of Service: 01/02/2018 2:30 PM Medical Record Number: [id number] Patient Account Number: [id number] Date of Birth/Sex: 06-16-1934 (83 y.o. F) Treating RN: Roger Shelter Primary Care Provider: Ceasar Lund Other Clinician: Referring Provider: Ceasar Lund Treating Provider/Extender: Melburn Hake, HOYT Weeks in Treatment: 1 Active Problems ICD-10 Evaluated Encounter Code Description Active Date Today Diagnosis L02.416 Cutaneous abscess of left lower limb 12/27/2017 No Yes L97.822 Non-pressure chronic ulcer of other part of left lower leg with 12/27/2017 No Yes fat layer exposed I87.2 Venous insufficiency (chronic) (peripheral) 12/27/2017 No Yes V67.20 Chronic systolic (congestive) heart failure 12/27/2017 No Yes I10 Essential (primary) hypertension 12/27/2017 No Yes Inactive Problems Resolved Problems Electronic Signature(s) Signed: 01/02/2018 11:53:17 PM By: Worthy Keeler PA-C Entered By: Worthy Keeler on 01/02/2018 14:41:30 Misty Berry, Misty D. ([id number]) -------------------------------------------------------------------------------- Progress Note Details Patient Name: Misty Drape D. Date of Service: 01/02/2018 2:30 PM Medical Record Number: [id number] Patient Account Number: [id number] Date of  Birth/Sex: 12/06/1934 (83 y.o. F) Treating RN: Roger Shelter Primary Care Provider: Ceasar Lund Other Clinician: Referring Provider: Ceasar Lund Treating Provider/Extender: Melburn Hake, HOYT Weeks in Treatment: 1 Subjective Chief Complaint Information obtained from Patient Left anterior shin abscess with ulcer History of Present Illness (HPI) 12/26/17 patient presents today with a past medical history positive for congestive heart failure, hypertension, bilateral lower extremity edema, venous insufficiency, and unfortunately for the past three weeks she has been dealing with the left anterior lower extremity dramatic wound after hitting her leg on the bottom of a free car door frame. This occurred roughly 3 weeks ago and patient states that she went to urgent care the same day where they performed in incision and drainage cleaning out quite a bit in the way of blood clots. However they did not recommend packing the wound and since that time she has developed a built-up for their blood clots in the region. Unfortunately this continues to give her problems she was given clindamycin although at this point it appeared that she may have an infection that is definitely your theme of noted currently. There does not appear to be any evidence of systemic infection which is good news. On evaluation today patient does have discomfort although it's many with touching over the area and again it's again even mainly with specifically squeezing or attempting to get any of the blood clots out of the wound. There appears to be two small openings although that connects and there is one central opening with asymptomatic undermining underlying this. Nonetheless it appears this likely is going to need to be open board in order to see this fully and completely heal appropriately. No fevers, chills, nausea, or vomiting noted at this time. 01/02/18 on evaluation today the patient's left lower  extremity ulcer appears to be doing significantly better. She did have some Slough on the surface of the wound but this was able to easily be debrided away with saline gauze utilizing a sterile Q-tip. With that being said she did not have any significant bleeding like last time overall I feel like she has tolerated the dressing changes very well. In general I feel like she's making great progress. With that being said I still feel like that she has some healing to go nonetheless but again I'm definitely pleased with the interval change from last week to this week. Patient History Information obtained from Patient. Family History Heart Disease - Father, No family  history of Cancer, Diabetes, Hereditary Spherocytosis, Hypertension, Kidney Disease, Lung Disease, Seizures, Stroke, Thyroid Problems, Tuberculosis. Social History Former smoker - quit 1972, Marital Status - Divorced, Alcohol Use - Never, Drug Use - No History, Caffeine Use - Never. Review of Systems (ROS) Constitutional Symptoms (General Health) Denies complaints or symptoms of Fever, Chills. Respiratory The patient has no complaints or symptoms. Cardiovascular The patient has no complaints or symptoms. Psychiatric The patient has no complaints or symptoms. Misty Berry, Misty D. ([id number]) Objective Constitutional Well-nourished and well-hydrated in no acute distress. Vitals Time Taken: 2:50 PM, Height: 61 in, Weight: 179 lbs, BMI: 33.8, Temperature: 98.2 F, Pulse: 61 bpm, Respiratory Rate: 16 breaths/min, Blood Pressure: 169/57 mmHg. Respiratory normal breathing without difficulty. clear to auscultation bilaterally. Cardiovascular regular rate and rhythm with normal S1, S2. Psychiatric this patient is able to make decisions and demonstrates good insight into disease process. Alert and Oriented x 3. pleasant and cooperative. General Notes: Patient's wound bed once it was cleaned well actually show good granulation there does  not appear to be any evidence of infection at this time which is excellent news. Overall I feel like she's making great progress at this time. We're gonna recommend that she continue with the same wound care measures. Integumentary (Hair, Skin) Wound #1 status is Open. Original cause of wound was Trauma. The wound is located on the Left,Anterior Lower Leg. The wound measures 1.5cm length x 2cm width x 0.5cm depth; 2.356cm^2 area and 1.178cm^3 volume. There is Fat Layer (Subcutaneous Tissue) Exposed exposed. There is no tunneling noted, however, there is undermining starting at 10:00 and ending at 11:00 with a maximum distance of 1.1cm. There is a large amount of sanguinous drainage noted. The wound margin is distinct with the outline attached to the wound base. There is small (1-33%) pink granulation within the wound bed. There is a medium (34-66%) amount of necrotic tissue within the wound bed including Eschar. The periwound skin appearance exhibited: Erythema. The surrounding wound skin color is noted with erythema which is circumferential. Periwound temperature was noted as No Abnormality. The periwound has tenderness on palpation. Assessment Active Problems ICD-10 Cutaneous abscess of left lower limb Non-pressure chronic ulcer of other part of left lower leg with fat layer exposed Venous insufficiency (chronic) (peripheral) Chronic systolic (congestive) heart failure Essential (primary) hypertension Misty Berry, Misty D. ([id number]) Plan Wound Cleansing: Wound #1 Left,Anterior Lower Leg: Clean wound with Normal Saline. Anesthetic (add to Medication List): Wound #1 Left,Anterior Lower Leg: Topical Lidocaine 4% cream applied to wound bed prior to debridement (In Clinic Only). Primary Wound Dressing: Wound #1 Left,Anterior Lower Leg: Silver Alginate - pack into the wound Secondary Dressing: Wound #1 Left,Anterior Lower Leg: Conform/Kerlix Drawtex Dressing Change Frequency: Wound #1  Left,Anterior Lower Leg: Change Dressing Monday, Wednesday, Friday Follow-up Appointments: Wound #1 Left,Anterior Lower Leg: Return Appointment in 1 week. Edema Control: Wound #1 Left,Anterior Lower Leg: Other: - kerlix wrap with strecth net. Home Health: Wound #1 Left,Anterior Lower Leg: [facility] Nurse may visit PRN to address patient s wound care needs. FACE TO FACE ENCOUNTER: MEDICARE and MEDICAID PATIENTS: I certify that this patient is under my care and that I had a face-to-face encounter that meets the physician face-to-face encounter requirements with this patient on this date. The encounter with the patient was in whole or in part for the following MEDICAL CONDITION: (primary reason for [place]) MEDICAL NECESSITY: I certify, that based on my findings, NURSING services are a  medically necessary home health service. HOME BOUND STATUS: I certify that my clinical findings support that this patient is homebound (i.e., Due to illness or injury, pt requires aid of supportive devices such as crutches, cane, wheelchairs, walkers, the use of special transportation or the assistance of another person to leave their place of residence. There is a normal inability to leave the home and doing so requires considerable and taxing effort. Other absences are for medical reasons / religious services and are infrequent or of short duration when for other reasons). If current dressing causes regression in wound condition, may D/C ordered dressing product/s and apply Normal Saline Moist Dressing daily until next [place] / Other MD appointment. [place] of regression in wound condition at [phone]. Please direct any NON-WOUND related issues/requests for orders to patient's Primary Care Physician I suggest at this point that we go ahead and continue with the above wound care measures for the next week. The patient is in  agreement with the plan. I feel like now that we got all the necrotic tissue out and I removed the dead tissue as well because by having to utilize the significant silver nitrate treatment last week that she will likely continue to show excellent signs of improvement week by week. The patient is in agreement with plan. If anything changes she will let me know otherwise it's a pleasure seeing her today I'm glad she seems to be doing well we will see were things stand at follow-up. Please see above for specific wound care orders. We will see patient for re-evaluation in 1 week(s) here in the clinic. If anything worsens or changes patient will contact our office for additional recommendations. I do believe that a snap vac would be a possibility for her we just need to check on the approval process she's in agreement with that as well. KARINDA, CABRIALES ([id number]) Electronic Signature(s) Signed: 01/02/2018 11:53:17 PM By: Worthy Keeler PA-C Entered By: Worthy Keeler on 01/02/2018 16:06:40 [address] Misty D. ([id number]) -------------------------------------------------------------------------------- ROS/PFSH Details Patient Name: Misty Berry, Misty D. Date of Service: 01/02/2018 2:30 PM Medical Record Number: [id number] Patient Account Number: [id number] Date of Birth/Sex: [birth year] (83 y.o. F) Treating RN: Roger Shelter Primary Care Provider: Ceasar Lund Other Clinician: Referring Provider: Ceasar Lund Treating Provider/Extender: Melburn Hake, HOYT Weeks in Treatment: 1 Information Obtained From Patient Wound History Do you currently have one or more open woundso Yes How many open wounds do you currently haveo 1 Approximately how long have you had your woundso 3 weeks How have you been treating your wound(s) until nowo prescription cream (unknown) Has your wound(s) ever healed and then re-openedo No Have you had any lab work done in the past montho No Have you tested  positive for an antibiotic resistant organism (MRSA, VRE)o No Have you tested positive for osteomyelitis (bone infection)o No Have you had any tests for circulation on your legso No Have you had other problems associated with your woundso Swelling Constitutional Symptoms (General Health) Complaints and Symptoms: Negative for: Fever; Chills Eyes Medical History: Positive for: Cataracts - surgery Respiratory Complaints and Symptoms: No Complaints or Symptoms Medical History: Positive for: Asthma Cardiovascular Complaints and Symptoms: No Complaints or Symptoms Medical History: Positive for: Arrhythmia - hx atrial tachycardia; Congestive Heart Failure; Hypertension Endocrine Medical History: Negative for: Type I Diabetes; Type II Diabetes Musculoskeletal Medical History: Positive for: Osteoarthritis Misty Berry, Misty D. ([id number]) Psychiatric Complaints and Symptoms: No Complaints or Symptoms HBO Extended History Items Eyes:  Cataracts Immunizations Pneumococcal Vaccine: Received Pneumococcal Vaccination: Yes Implantable Devices Family and Social History Cancer: No; Diabetes: No; Heart Disease: Yes - Father; Hereditary Spherocytosis: No; Hypertension: No; Kidney Disease: No; Lung Disease: No; Seizures: No; Stroke: No; Thyroid Problems: No; Tuberculosis: No; Former smoker - quit 1972; Marital Status - Divorced; Alcohol Use: Never; Drug Use: No History; Caffeine Use: Never; Financial Concerns: No; Food, Clothing or Shelter Needs: No; Support System Lacking: No; Transportation Concerns: No; Advanced Directives: Yes (Not Provided); Patient does not want information on Advanced Directives; Do not resuscitate: No; Living Will: Yes (Not Provided); Medical Power of Attorney: Yes - [name] (brother) (Not Provided) Physician Affirmation I have reviewed and agree with the above information. Electronic Signature(s) Signed: 01/02/2018 5:25:05 PM By: Roger Shelter Signed: 01/02/2018 11:53:17  PM By: Worthy Keeler PA-C Entered By: Worthy Keeler on 01/02/2018 16:05:32 Misty Berry, Misty Berry D. ([id number]) -------------------------------------------------------------------------------- SuperBill Details Patient Name: Misty Drape D. Date of Service: 01/02/2018 Medical Record Number: [id number] Patient Account Number: [id number] Date of Birth/Sex: 1935/01/28 (83 y.o. F) Treating RN: Roger Shelter Primary Care Provider: Ceasar Lund Other Clinician: Referring Provider: Ceasar Lund Treating Provider/Extender: Melburn Hake, HOYT Weeks in Treatment: 1 Diagnosis Coding ICD-10 Codes Code Description L02.416 Cutaneous abscess of left lower limb L97.822 Non-pressure chronic ulcer of other part of left lower leg with fat layer exposed I87.2 Venous insufficiency (chronic) (peripheral) V67.01 Chronic systolic (congestive) heart failure I10 Essential (primary) hypertension Facility Procedures CPT4 Code: [id number] Description: 43888 - WOUND CARE VISIT-LEV 2 EST PT Modifier: Quantity: 1 Physician Procedures CPT4 Code Description: [id number] - WC PHYS LEVEL 3 - EST PT ICD-10 Diagnosis Description L02.416 Cutaneous abscess of left lower limb L97.822 Non-pressure chronic ulcer of other part of left lower leg wit I87.2 Venous insufficiency (chronic)  (peripheral) V61.53 Chronic systolic (congestive) heart failure Modifier: h fat layer expos Quantity: 1 ed Electronic Signature(s) Signed: 01/02/2018 11:53:17 PM By: Worthy Keeler PA-C Entered By: Worthy Keeler on 01/02/2018 16:06:54

## 2018-01-03 NOTE — Progress Notes (Signed)
TIMIYA, HOWELLS ([id number]) Visit Report for 01/02/2018 Arrival Information Details Patient Name: ZIVA, NUNZIATA. Date of Service: 01/02/2018 2:30 PM Medical Record Number: [id number] Patient Account Number: [id number] Date of Birth/Sex: 10-Oct-1934 (83 y.o. F) Treating RN: Secundino Ginger Primary Care Chavez Rosol: Ceasar Lund Other Clinician: Referring Terria Deschepper: Ceasar Lund Treating Cheveyo Virginia/Extender: Melburn Hake, HOYT Weeks in Treatment: 1 Visit Information History Since Last Visit Added or deleted any medications: No Patient Arrived: Walker Any new allergies or adverse reactions: No Arrival Time: 14:54 Had a fall or experienced change in No Accompanied By: self activities of daily living that may affect Transfer Assistance: None risk of falls: Patient Identification Verified: Yes Signs or symptoms of abuse/neglect since last visito No Secondary Verification Process Completed: Yes Hospitalized since last visit: No Patient Requires Transmission-Based Precautions: No Has Dressing in Place as Prescribed: Yes Patient Has Alerts: No Pain Present Now: No Electronic Signature(s) Signed: 01/02/2018 4:33:51 PM By: Secundino Ginger Entered By: Secundino Ginger on 01/02/2018 14:55:56 Pomeroy, Derrisha D. ([id number]) -------------------------------------------------------------------------------- Clinic Level of Care Assessment Details Patient Name: [name] Date of Service: 01/02/2018 2:30 PM Medical Record Number: [id number] Patient Account Number: [id number] Date of Birth/Sex: [birth year] (83 y.o. F) Treating RN: Roger Shelter Primary Care Dedric Ethington: Ceasar Lund Other Clinician: Referring Velvie Thomaston: Ceasar Lund Treating Anjani Feuerborn/Extender: Melburn Hake, HOYT Weeks in Treatment: 1 Clinic Level of Care Assessment Items TOOL 4 Quantity Score X - Use when only an EandM is performed on FOLLOW-UP visit 1 0 ASSESSMENTS - Nursing Assessment / Reassessment X -  Reassessment of Co-morbidities (includes updates in patient status) 1 10 X- 1 5 Reassessment of Adherence to Treatment Plan ASSESSMENTS - Wound and Skin Assessment / Reassessment X - Simple Wound Assessment / Reassessment - one wound 1 5 []  - 0 Complex Wound Assessment / Reassessment - multiple wounds []  - 0 Dermatologic / Skin Assessment (not related to wound area) ASSESSMENTS - Focused Assessment []  - Circumferential Edema Measurements - multi extremities 0 []  - 0 Nutritional Assessment / Counseling / Intervention []  - 0 Lower Extremity Assessment (monofilament, tuning fork, pulses) []  - 0 Peripheral Arterial Disease Assessment (using hand held doppler) ASSESSMENTS - Ostomy and/or Continence Assessment and Care []  - Incontinence Assessment and Management 0 []  - 0 Ostomy Care Assessment and Management (repouching, etc.) PROCESS - Coordination of Care X - Simple Patient / Family Education for ongoing care 1 15 []  - 0 Complex (extensive) Patient / Family Education for ongoing care []  - 0 Staff obtains Programmer, systems, Records, Test Results / Process Orders []  - 0 Staff telephones HHA, Nursing Homes / Clarify orders / etc []  - 0 Routine Transfer to another Facility (non-emergent condition) []  - 0 Routine Hospital Admission (non-emergent condition) []  - 0 New Admissions / Biomedical engineer / Ordering NPWT, Apligraf, etc. []  - 0 Emergency Hospital Admission (emergent condition) X- 1 10 Simple Discharge Coordination Lindseth, Adrine D. ([id number]) []  - 0 Complex (extensive) Discharge Coordination PROCESS - Special Needs []  - Pediatric / Minor Patient Management 0 []  - 0 Isolation Patient Management []  - 0 Hearing / Language / Visual special needs []  - 0 Assessment of Community assistance (transportation, D/C planning, etc.) []  - 0 Additional assistance / Altered mentation []  - 0 Support Surface(s) Assessment (bed, cushion, seat, etc.) INTERVENTIONS - Wound Cleansing /  Measurement X - Simple Wound Cleansing - one wound 1 5 []  - 0 Complex Wound Cleansing - multiple wounds X- 1 5 Wound Imaging (photographs - any number of wounds) []  - 0  Wound Tracing (instead of photographs) X- 1 5 Simple Wound Measurement - one wound []  - 0 Complex Wound Measurement - multiple wounds INTERVENTIONS - Wound Dressings X - Small Wound Dressing one or multiple wounds 1 10 []  - 0 Medium Wound Dressing one or multiple wounds []  - 0 Large Wound Dressing one or multiple wounds []  - 0 Application of Medications - topical []  - 0 Application of Medications - injection INTERVENTIONS - Miscellaneous []  - External ear exam 0 []  - 0 Specimen Collection (cultures, biopsies, blood, body fluids, etc.) []  - 0 Specimen(s) / Culture(s) sent or taken to Lab for analysis []  - 0 Patient Transfer (multiple staff / Civil Service fast streamer / Similar devices) []  - 0 Simple Staple / Suture removal (25 or less) []  - 0 Complex Staple / Suture removal (26 or more) []  - 0 Hypo / Hyperglycemic Management (close monitor of Blood Glucose) []  - 0 Ankle / Brachial Index (ABI) - do not check if billed separately X- 1 5 Vital Signs Loge, Harlow D. ([id number]) Has the patient been seen at the hospital within the last three years: Yes Total Score: 75 Level Of Care: New/Established - Level 2 Electronic Signature(s) Signed: 01/02/2018 5:25:05 PM By: Roger Shelter Entered By: Roger Shelter on 01/02/2018 15:35:07 Franklin, Zamoria D. ([id number]) -------------------------------------------------------------------------------- Encounter Discharge Information Details Patient Name: [name] Date of Service: 01/02/2018 2:30 PM Medical Record Number: [id number] Patient Account Number: [id number] Date of Birth/Sex: 1935-01-31 (83 y.o. F) Treating RN: Ahmed Prima Primary Care Cristopher Ciccarelli: Ceasar Lund Other Clinician: Referring Kieren Adkison: Ceasar Lund Treating Telly Broberg/Extender:  Melburn Hake, HOYT Weeks in Treatment: 1 Encounter Discharge Information Items Discharge Condition: Stable Ambulatory Status: Walker Discharge Destination: Home Transportation: Private Auto Accompanied By: self Schedule Follow-up Appointment: Yes Clinical Summary of Care: Electronic Signature(s) Signed: 01/02/2018 5:20:31 PM By: Alric Quan Entered By: Alric Quan on 01/02/2018 15:40:11 Lambson, Glayds D. ([id number]) -------------------------------------------------------------------------------- Lower Extremity Assessment Details Patient Name: DARNELL, STIMSON D. Date of Service: 01/02/2018 2:30 PM Medical Record Number: [id number] Patient Account Number: [id number] Date of Birth/Sex: [birth year] (83 y.o. F) Treating RN: Secundino Ginger Primary Care Anamae Rochelle: Ceasar Lund Other Clinician: Referring Demoni Parmar: Ceasar Lund Treating Krystall Kruckenberg/Extender: Melburn Hake, HOYT Weeks in Treatment: 1 Edema Assessment Assessed: [Left: No] [Right: No] Edema: [Left: Yes] [Right: Yes] Calf Left: Right: Point of Measurement: 33 cm From Medial Instep 38 cm 37 cm Ankle Left: Right: Point of Measurement: 10 cm From Medial Instep 22 cm 23 cm Vascular Assessment Pulses: Dorsalis Pedis Palpable: [Left:Yes] [Right:Yes] Posterior Tibial Extremity colors, hair growth, and conditions: Extremity Color: [Left:Hyperpigmented] [Right:Hyperpigmented] Hair Growth on Extremity: [Left:No] [Right:No] Temperature of Extremity: [Left:Warm] [Right:Warm] Capillary Refill: [Left:< 3 seconds] [Right:< 3 seconds] Toe Nail Assessment Left: Right: Thick: Yes Yes Discolored: Yes Deformed: Yes Yes Improper Length and Hygiene: No No Electronic Signature(s) Signed: 01/02/2018 4:33:51 PM By: Secundino Ginger Entered By: Secundino Ginger on 01/02/2018 15:11:41 Enslow, Keeshia D. ([id number]) -------------------------------------------------------------------------------- Multi Wound Chart Details Patient Name: [name] Date of Service: 01/02/2018 2:30 PM Medical Record Number: [id number] Patient Account Number: [id number] Date of Birth/Sex: [birth year] (83 y.o. F) Treating RN: Roger Shelter Primary Care Marcelles Clinard: Ceasar Lund Other Clinician: Referring Bobby Barton: Ceasar Lund Treating Asna Muldrow/Extender: STONE III, HOYT Weeks in Treatment: 1 Vital Signs Height(in): 61 Pulse(bpm): 61 Weight(lbs): 179 Blood Pressure(mmHg): 169/57 Body Mass Index(BMI): 34 Temperature(F): 98.2 Respiratory Rate 16 (breaths/min): Photos: [1:No Photos] [N/A:N/A] Wound Location: [1:Left Lower Leg - Anterior] [N/A:N/A] Wounding Event: [1:Trauma] [N/A:N/A] Primary Etiology: [1:Trauma, Other] [N/A:N/A] Comorbid History: [  1:Cataracts, Asthma, Arrhythmia, Congestive Heart Failure, Hypertension, Osteoarthritis] [N/A:N/A] Date Acquired: [1:12/05/2017] [N/A:N/A] Weeks of Treatment: [1:1] [N/A:N/A] Wound Status: [1:Open] [N/A:N/A] Measurements L x W x D [1:1.5x2x0.5] [N/A:N/A] (cm) Area (cm) : [1:2.356] [N/A:N/A] Volume (cm) : [1:1.178] [N/A:N/A] % Reduction in Area: [1:-188.40%] [N/A:N/A] % Reduction in Volume: [1:-31.20%] [N/A:N/A] Starting Position 1 [1:10] (o'clock): Ending Position 1 [1:11] (o'clock): Maximum Distance 1 (cm): [1:1.1] Undermining: [1:Yes] [N/A:N/A] Classification: [1:Full Thickness Without Exposed Support Structures] [N/A:N/A] Exudate Amount: [1:Large] [N/A:N/A] Exudate Type: [1:Sanguinous] [N/A:N/A] Exudate Color: [1:red] [N/A:N/A] Wound Margin: [1:Distinct, outline attached] [N/A:N/A] Granulation Amount: [1:Small (1-33%)] [N/A:N/A] Granulation Quality: [1:Pink] [N/A:N/A] Necrotic Amount: [1:Medium (34-66%)] [N/A:N/A] Necrotic Tissue: [1:Eschar] [N/A:N/A] Exposed Structures: [1:Fat Layer (Subcutaneous Tissue) Exposed: Yes Fascia: No Tendon: No] [N/A:N/A] Muscle: No Joint: No Bone: No Epithelialization: None N/A N/A Periwound Skin Texture: No Abnormalities  Noted N/A N/A Periwound Skin Moisture: No Abnormalities Noted N/A N/A Periwound Skin Color: Erythema: Yes N/A N/A Erythema Location: Circumferential N/A N/A Temperature: No Abnormality N/A N/A Tenderness on Palpation: Yes N/A N/A Wound Preparation: Ulcer Cleansing: N/A N/A Rinsed/Irrigated with Saline Topical Anesthetic Applied: Other: LIDOCAINE 4% Treatment Notes Electronic Signature(s) Signed: 01/02/2018 5:25:05 PM By: Roger Shelter Entered By: Roger Shelter on 01/02/2018 15:26:20 Harcum, Margarit D. ([id number]) -------------------------------------------------------------------------------- Multi-Disciplinary Care Plan Details Patient Name: QUINA, WILBOURNE D. Date of Service: 01/02/2018 2:30 PM Medical Record Number: [id number] Patient Account Number: [id number] Date of Birth/Sex: Oct 17, 1934 (83 y.o. F) Treating RN: Roger Shelter Primary Care Denicia Pagliarulo: Ceasar Lund Other Clinician: Referring Naimah Yingst: Ceasar Lund Treating Henrik Orihuela/Extender: Melburn Hake, HOYT Weeks in Treatment: 1 Active Inactive ` Orientation to the Wound Care Program Nursing Diagnoses: Knowledge deficit related to the wound healing center program Goals: Patient/caregiver will verbalize understanding of the [facility] Date Initiated: 12/26/2017 Target Resolution Date: 01/16/2018 Goal Status: Active Interventions: Provide education on orientation to the wound center Notes: ` Wound/Skin Impairment Nursing Diagnoses: Impaired tissue integrity Goals: Patient/caregiver will verbalize understanding of skin care regimen Date Initiated: 12/26/2017 Target Resolution Date: 01/16/2018 Goal Status: Active Ulcer/skin breakdown will have a volume reduction of 30% by week 4 Date Initiated: 12/26/2017 Target Resolution Date: 01/16/2018 Goal Status: Active Interventions: Assess patient/caregiver ability to obtain necessary supplies Assess patient/caregiver ability to perform  ulcer/skin care regimen upon admission and as needed Assess ulceration(s) every visit Treatment Activities: Skin care regimen initiated : 12/26/2017 Notes: Electronic Signature(s) Signed: 01/02/2018 5:25:05 PM By: Valentina Gu, Leeta D. ([id number]) Entered By: Roger Shelter on 01/02/2018 15:26:11 Bermingham, Leia D. ([id number]) -------------------------------------------------------------------------------- Pain Assessment Details Patient Name: ZAMORIA, BOSS D. Date of Service: 01/02/2018 2:30 PM Medical Record Number: [id number] Patient Account Number: [id number] Date of Birth/Sex: [birth year] (83 y.o. F) Treating RN: Secundino Ginger Primary Care Darra Rosa: Ceasar Lund Other Clinician: Referring Aivan Fillingim: Ceasar Lund Treating Karena Kinker/Extender: Melburn Hake, HOYT Weeks in Treatment: 1 Active Problems Location of Pain Severity and Description of Pain Patient Has Paino No Site Locations Pain Management and Medication Current Pain Management: Goals for Pain Management Topical or injectable lidocaine is offered to patient for acute pain when surgical debridement is performed. If needed, Patient is instructed to use over the counter pain medication for the following 24-48 hours after debridement. Wound care MDs do not prescribed pain medications. Patient has chronic pain or uncontrolled pain. Patient has been instructed to make an appointment with their Primary Care Physician for pain management. Electronic Signature(s) Signed: 01/02/2018 4:33:51 PM By: Secundino Ginger Entered By: Secundino Ginger on 01/02/2018 14:57:33 Call, Nick D. ([id number]) -------------------------------------------------------------------------------- Patient/Caregiver Education Details  Patient Name: KARLI, WICKIZER. Date of Service: 01/02/2018 2:30 PM Medical Record Number: [id number] Patient Account Number: [id number] Date of Birth/Gender: 1934/08/18 (83 y.o. F) Treating RN: Ahmed Prima Primary Care Physician: Ceasar Lund Other Clinician: Referring Physician: Ceasar Lund Treating Physician/Extender: Sharalyn Ink in Treatment: 1 Education Assessment Education Provided To: Patient Education Topics Provided Wound/Skin Impairment: Handouts: Caring for Your Ulcer, Skin Care Do's and Dont's, Other: change dressing as ordered Methods: Demonstration, Explain/Verbal Responses: State content correctly Electronic Signature(s) Signed: 01/02/2018 5:20:31 PM By: Alric Quan Entered By: Alric Quan on 01/02/2018 15:40:26 Enigma, Iroquois. ([id number]) -------------------------------------------------------------------------------- Wound Assessment Details Patient Name: [name] Date of Service: 01/02/2018 2:30 PM Medical Record Number: [id number] Patient Account Number: [id number] Date of Birth/Sex: 1935/02/02 (83 y.o. F) Treating RN: Secundino Ginger Primary Care Fareeda Downard: Ceasar Lund Other Clinician: Referring Elgin Carn: Ceasar Lund Treating Seymour Pavlak/Extender: STONE III, HOYT Weeks in Treatment: 1 Wound Status Wound Number: 1 Primary Trauma, Other Etiology: Wound Location: Left Lower Leg - Anterior Wound Open Wounding Event: Trauma Status: Date Acquired: 12/05/2017 Comorbid Cataracts, Asthma, Arrhythmia, Congestive Weeks Of Treatment: 1 History: Heart Failure, Hypertension, Osteoarthritis Clustered Wound: No Photos Photo Uploaded By: Secundino Ginger on 01/02/2018 16:32:04 Wound Measurements Length: (cm) 1.5 % Reduct Width: (cm) 2 % Reduct Depth: (cm) 0.5 Epitheli Area: (cm) 2.356 Tunneli Volume: (cm) 1.178 Undermi Start Endin Maxim ion in Area: -188.4% ion in Volume: -31.2% alization: None ng: No ning: Yes ing Position (o'clock): 10 g Position (o'clock): 11 um Distance: (cm) 1.1 Wound Description Full Thickness Without Exposed Support Foul Odo Classification: Structures Slough/F Wound  Margin: Distinct, outline attached Exudate Large Amount: Exudate Type: Sanguinous Exudate Color: red r After Cleansing: No ibrino No Wound Bed Granulation Amount: Small (1-33%) Exposed Structure Granulation Quality: Pink Fascia Exposed: No Necrotic Amount: Medium (34-66%) Fat Layer (Subcutaneous Tissue) Exposed: Yes Necrotic Quality: Eschar Tendon Exposed: No Muscle Exposed: No Joint Exposed: No Cliff, Marijke D. ([id number]) Bone Exposed: No Periwound Skin Texture Texture Color No Abnormalities Noted: No No Abnormalities Noted: No Erythema: Yes Moisture Erythema Location: Circumferential No Abnormalities Noted: No Temperature / Pain Temperature: No Abnormality Tenderness on Palpation: Yes Wound Preparation Ulcer Cleansing: Rinsed/Irrigated with Saline Topical Anesthetic Applied: Other: LIDOCAINE 4%, Treatment Notes Wound #1 (Left, Anterior Lower Leg) 1. Cleansed with: Clean wound with Normal Saline 2. Anesthetic Topical Lidocaine 4% cream to wound bed prior to debridement 4. Dressing Applied: Other dressing (specify in notes) 5. Secondary Dressing Applied ABD Pad Kerlix/Conform 7. Secured with Tape Notes silvercel, drawtex, netting Electronic Signature(s) Signed: 01/02/2018 4:33:51 PM By: Secundino Ginger Entered By: Secundino Ginger on 01/02/2018 15:07:47 Szymanowski, Beatric D. ([id number]) -------------------------------------------------------------------------------- Vitals Details Patient Name: [name] Date of Service: 01/02/2018 2:30 PM Medical Record Number: [id number] Patient Account Number: [id number] Date of Birth/Sex: 1934-12-31 (83 y.o. F) Treating RN: Secundino Ginger Primary Care Kingson Lohmeyer: Ceasar Lund Other Clinician: Referring Meghan Tiemann: Ceasar Lund Treating Sal Spratley/Extender: Melburn Hake, HOYT Weeks in Treatment: 1 Vital Signs Time Taken: 14:50 Temperature (F): 98.2 Height (in): 61 Pulse (bpm): 61 Weight (lbs): 179 Respiratory Rate  (breaths/min): 16 Body Mass Index (BMI): 33.8 Blood Pressure (mmHg): 169/57 Reference Range: 80 - 120 mg / dl Electronic Signature(s) Signed: 01/02/2018 4:33:51 PM By: Secundino Ginger Entered BySecundino Ginger on 01/02/2018 14:59:57

## 2018-01-09 ENCOUNTER — Encounter: Payer: Medicare Other | Admitting: Physician Assistant

## 2018-01-09 DIAGNOSIS — L02416 Cutaneous abscess of left lower limb: Secondary | ICD-10-CM | POA: Diagnosis not present

## 2018-01-10 ENCOUNTER — Ambulatory Visit: Payer: Medicare Other | Admitting: Physician Assistant

## 2018-01-16 ENCOUNTER — Encounter: Payer: Medicare Other | Attending: Physician Assistant | Admitting: Physician Assistant

## 2018-01-16 DIAGNOSIS — I110 Hypertensive heart disease with heart failure: Secondary | ICD-10-CM | POA: Diagnosis not present

## 2018-01-16 DIAGNOSIS — L02416 Cutaneous abscess of left lower limb: Secondary | ICD-10-CM | POA: Insufficient documentation

## 2018-01-16 DIAGNOSIS — I872 Venous insufficiency (chronic) (peripheral): Secondary | ICD-10-CM | POA: Diagnosis not present

## 2018-01-16 DIAGNOSIS — I5022 Chronic systolic (congestive) heart failure: Secondary | ICD-10-CM | POA: Insufficient documentation

## 2018-01-16 DIAGNOSIS — L97822 Non-pressure chronic ulcer of other part of left lower leg with fat layer exposed: Secondary | ICD-10-CM | POA: Insufficient documentation

## 2018-01-24 ENCOUNTER — Encounter: Payer: Medicare Other | Admitting: Nurse Practitioner

## 2018-01-24 DIAGNOSIS — L97822 Non-pressure chronic ulcer of other part of left lower leg with fat layer exposed: Secondary | ICD-10-CM | POA: Diagnosis not present

## 2018-01-25 ENCOUNTER — Encounter: Payer: Self-pay | Admitting: Podiatry

## 2018-01-25 ENCOUNTER — Ambulatory Visit (INDEPENDENT_AMBULATORY_CARE_PROVIDER_SITE_OTHER): Payer: Medicare Other | Admitting: Podiatry

## 2018-01-25 DIAGNOSIS — M79676 Pain in unspecified toe(s): Secondary | ICD-10-CM

## 2018-01-25 DIAGNOSIS — B351 Tinea unguium: Secondary | ICD-10-CM

## 2018-01-25 NOTE — Progress Notes (Signed)
She presents today chief complaint of painful elongated toenails.  Objective: Pulses are palpable.  Nails are long thick yellow dystrophic-like mycotic painful palpation.  Assessment: Pain in limb secondary onychomycosis 1 through 5 bilateral.  Plan: Debridement of toenails 1 through 5 bilateral.

## 2018-01-26 NOTE — Progress Notes (Signed)
Misty Berry ([id number]) Visit Report for 01/16/2018 Arrival Information Details Patient Name: Misty Berry, Misty Berry. Date of Service: 01/16/2018 10:30 AM Medical Record Number: [id number] Patient Account Number: [id number] Date of Birth/Sex: [birth year] (83 y.o. F) Treating RN: Ahmed Prima Primary Care Michaline Kindig: Ceasar Lund Other Clinician: Referring Shante Maysonet: Ceasar Lund Treating Kieara Schwark/Extender: Melburn Hake, HOYT Weeks in Treatment: 3 Visit Information History Since Last Visit All ordered tests and consults were completed: No Patient Arrived: Walker Added or deleted any medications: No Arrival Time: 10:53 Any new allergies or adverse reactions: No Accompanied By: self Had a fall or experienced change in No Transfer Assistance: EasyPivot Patient activities of daily living that may affect Lift risk of falls: Patient Identification Verified: Yes Signs or symptoms of abuse/neglect since last visito No Secondary Verification Process Yes Hospitalized since last visit: No Completed: Implantable device outside of the clinic excluding No Patient Requires Transmission-Based No cellular tissue based products placed in the center Precautions: since last visit: Patient Has Alerts: No Has Dressing in Place as Prescribed: Yes Pain Present Now: No Electronic Signature(s) Signed: 01/16/2018 4:59:33 PM By: Alric Quan Entered By: Alric Quan on 01/16/2018 10:54:04 [name], [name] ([id number]) -------------------------------------------------------------------------------- Clinic Level of Care Assessment Details Patient Name: Misty Drape D. Date of Service: 01/16/2018 10:30 AM Medical Record Number: [id number] Patient Account Number: [id number] Date of Birth/Sex: 05/15/1935 (83 y.o. F) Treating RN: Roger Shelter Primary Care Taelor Waymire: Ceasar Lund Other Clinician: Referring Lety Cullens: Ceasar Lund Treating Everlena Mackley/Extender: Melburn Hake, HOYT Weeks in Treatment: 3 Clinic Level of Care Assessment Items TOOL 4 Quantity Score X - Use when only an EandM is performed on FOLLOW-UP visit 1 0 ASSESSMENTS - Nursing Assessment / Reassessment X - Reassessment of Co-morbidities (includes updates in patient status) 1 10 X- 1 5 Reassessment of Adherence to Treatment Plan ASSESSMENTS - Wound and Skin Assessment / Reassessment X - Simple Wound Assessment / Reassessment - one wound 1 5 []  - 0 Complex Wound Assessment / Reassessment - multiple wounds []  - 0 Dermatologic / Skin Assessment (not related to wound area) ASSESSMENTS - Focused Assessment []  - Circumferential Edema Measurements - multi extremities 0 []  - 0 Nutritional Assessment / Counseling / Intervention []  - 0 Lower Extremity Assessment (monofilament, tuning fork, pulses) []  - 0 Peripheral Arterial Disease Assessment (using hand held doppler) ASSESSMENTS - Ostomy and/or Continence Assessment and Care []  - Incontinence Assessment and Management 0 []  - 0 Ostomy Care Assessment and Management (repouching, etc.) PROCESS - Coordination of Care X - Simple Patient / Family Education for ongoing care 1 15 []  - 0 Complex (extensive) Patient / Family Education for ongoing care []  - 0 Staff obtains Programmer, systems, Records, Test Results / Process Orders []  - 0 Staff telephones HHA, Nursing Homes / Clarify orders / etc []  - 0 Routine Transfer to another Facility (non-emergent condition) []  - 0 Routine Hospital Admission (non-emergent condition) []  - 0 New Admissions / Biomedical engineer / Ordering NPWT, Apligraf, etc. []  - 0 Emergency Hospital Admission (emergent condition) X- 1 10 Simple Discharge Coordination [place], Livana D. ([id number]) []  - 0 Complex (extensive) Discharge Coordination PROCESS - Special Needs []  - Pediatric / Minor Patient Management 0 []  - 0 Isolation Patient Management []  - 0 Hearing / Language / Visual special needs []  - 0 Assessment of  Community assistance (transportation, D/C planning, etc.) []  - 0 Additional assistance / Altered mentation []  - 0 Support Surface(s) Assessment (bed, cushion, seat, etc.) INTERVENTIONS - Wound Cleansing / Measurement X - Simple Wound  Cleansing - one wound 1 5 []  - 0 Complex Wound Cleansing - multiple wounds X- 1 5 Wound Imaging (photographs - any number of wounds) []  - 0 Wound Tracing (instead of photographs) X- 1 5 Simple Wound Measurement - one wound []  - 0 Complex Wound Measurement - multiple wounds INTERVENTIONS - Wound Dressings X - Small Wound Dressing one or multiple wounds 1 10 []  - 0 Medium Wound Dressing one or multiple wounds []  - 0 Large Wound Dressing one or multiple wounds []  - 0 Application of Medications - topical []  - 0 Application of Medications - injection INTERVENTIONS - Miscellaneous []  - External ear exam 0 []  - 0 Specimen Collection (cultures, biopsies, blood, body fluids, etc.) []  - 0 Specimen(s) / Culture(s) sent or taken to Lab for analysis []  - 0 Patient Transfer (multiple staff / Civil Service fast streamer / Similar devices) []  - 0 Simple Staple / Suture removal (25 or less) []  - 0 Complex Staple / Suture removal (26 or more) []  - 0 Hypo / Hyperglycemic Management (close monitor of Blood Glucose) []  - 0 Ankle / Brachial Index (ABI) - do not check if billed separately X- 1 5 Vital Signs Misty Berry, Misty D. ([id number]) Has the patient been seen at the hospital within the last three years: Yes Total Score: 75 Level Of Care: New/Established - Level 2 Electronic Signature(s) Signed: 01/17/2018 8:47:36 AM By: Roger Shelter Entered By: Roger Shelter on 01/16/2018 11:35:01 Misty Berry, Misty D. ([id number]) -------------------------------------------------------------------------------- Lower Extremity Assessment Details Patient Name: [name], [name] Date of Service: 01/16/2018 10:30 AM Medical Record Number: [id number] Patient Account Number: [id number] Date of  Birth/Sex: 1935-02-27 (83 y.o. F) Treating RN: Ahmed Prima Primary Care Kalim Kissel: Ceasar Lund Other Clinician: Referring Darnell Stimson: Ceasar Lund Treating Nyeisha Goodall/Extender: Melburn Hake, HOYT Weeks in Treatment: 3 Edema Assessment Assessed: [Left: No] [Right: No] [Left: Edema] [Right: :] Calf Left: Right: Point of Measurement: 33 cm From Medial Instep 36.2 cm cm Ankle Left: Right: Point of Measurement: 10 cm From Medial Instep 22.6 cm cm Vascular Assessment Pulses: Dorsalis Pedis Palpable: [Left:Yes] [Right:Yes] Posterior Tibial Extremity colors, hair growth, and conditions: Extremity Color: [Left:Hyperpigmented] [Right:Hyperpigmented] Temperature of Extremity: [Left:Warm] [Right:Warm] Capillary Refill: [Left:< 3 seconds] [Right:< 3 seconds] Toe Nail Assessment Left: Right: Thick: Yes Yes Discolored: No No Deformed: No No Improper Length and Hygiene: No Electronic Signature(s) Signed: 01/16/2018 4:59:33 PM By: Alric Quan Entered By: Alric Quan on 01/16/2018 11:06:19 Misty Berry, Misty D. ([id number]) -------------------------------------------------------------------------------- Multi Wound Chart Details Patient Name: Misty Drape D. Date of Service: 01/16/2018 10:30 AM Medical Record Number: [id number] Patient Account Number: [id number] Date of Birth/Sex: [birth year] (83 y.o. F) Treating RN: Roger Shelter Primary Care Tarisa Paola: Ceasar Lund Other Clinician: Referring Marykay Mccleod: Ceasar Lund Treating Rogerick Baldwin/Extender: STONE III, HOYT Weeks in Treatment: 3 Vital Signs Height(in): 61 Pulse(bpm): 58 Weight(lbs): 179 Blood Pressure(mmHg): 153/59 Body Mass Index(BMI): 34 Temperature(F): 97.8 Respiratory Rate 18 (breaths/min): Photos: [1:No Photos] [2:No Photos] [N/A:N/A] Wound Location: [1:Left Lower Leg - Anterior] [2:Left Lower Leg - Lateral] [N/A:N/A] Wounding Event: [1:Trauma] [2:Gradually Appeared]  [N/A:N/A] Primary Etiology: [1:Trauma, Other] [2:Cellulitis] [N/A:N/A] Comorbid History: [1:Cataracts, Asthma, Arrhythmia, Congestive Heart Failure, Hypertension, Osteoarthritis] [2:Cataracts, Asthma, Arrhythmia, Congestive Heart Failure, Hypertension, Osteoarthritis] [N/A:N/A] Date Acquired: [1:12/05/2017] [2:01/15/2018] [N/A:N/A] Weeks of Treatment: [1:3] [2:0] [N/A:N/A] Wound Status: [1:Open] [2:Open] [N/A:N/A] Measurements L x W x D [1:0.9x1.5x0.3] [2:0.6x1x0.1] [N/A:N/A] (cm) Area (cm) : [1:1.06] [2:0.471] [N/A:N/A] Volume (cm) : [1:0.318] [2:0.047] [N/A:N/A] % Reduction in Area: [1:-29.70%] [2:N/A] [N/A:N/A] % Reduction in Volume: [1:64.60%] [2:N/A] [N/A:N/A] Starting Position 1 [  1:10] (o'clock): Ending Position 1 [1:11] (o'clock): Maximum Distance 1 (cm): [1:0.5] Undermining: [1:Yes] [2:No] [N/A:N/A] Classification: [1:Full Thickness Without Exposed Support Structures] [2:Full Thickness Without Exposed Support Structures] [N/A:N/A] Exudate Amount: [1:Large] [2:Large] [N/A:N/A] Exudate Type: [1:Serous] [2:Sanguinous] [N/A:N/A] Exudate Color: [1:amber] [2:red] [N/A:N/A] Wound Margin: [1:Distinct, outline attached] [2:Flat and Intact] [N/A:N/A] Granulation Amount: [1:Medium (34-66%)] [2:Medium (34-66%)] [N/A:N/A] Granulation Quality: [1:Pink] [2:Red] [N/A:N/A] Necrotic Amount: [1:Medium (34-66%)] [2:Medium (34-66%)] [N/A:N/A] Necrotic Tissue: [1:Adherent Slough] [2:Eschar] [N/A:N/A] Exposed Structures: [1:Fat Layer (Subcutaneous Tissue) Exposed: Yes Fascia: No Tendon: No] [2:Fascia: No Fat Layer (Subcutaneous Tissue) Exposed: No Tendon: No] [N/A:N/A] Muscle: No Muscle: No Joint: No Joint: No Bone: No Bone: No Epithelialization: None None N/A Periwound Skin Texture: Scarring: Yes No Abnormalities Noted N/A Periwound Skin Moisture: No Abnormalities Noted No Abnormalities Noted N/A Periwound Skin Color: Erythema: Yes Erythema: Yes N/A Erythema Location: Circumferential  Circumferential N/A Temperature: No Abnormality No Abnormality N/A Tenderness on Palpation: Yes Yes N/A Wound Preparation: Ulcer Cleansing: Ulcer Cleansing: N/A Rinsed/Irrigated with Saline Rinsed/Irrigated with Saline Topical Anesthetic Applied: Topical Anesthetic Applied: Other: LIDOCAINE 4% Other: lidocaine 4% Treatment Notes Electronic Signature(s) Signed: 01/17/2018 8:47:36 AM By: Roger Shelter Entered By: Roger Shelter on 01/16/2018 11:27:56 Misty Berry, Misty D. ([id number]) -------------------------------------------------------------------------------- Multi-Disciplinary Care Plan Details Patient Name: TISHIE, ALTMANN D. Date of Service: 01/16/2018 10:30 AM Medical Record Number: [id number] Patient Account Number: [id number] Date of Birth/Sex: 23-Dec-1934 (83 y.o. F) Treating RN: Roger Shelter Primary Care Kanna Dafoe: Ceasar Lund Other Clinician: Referring Samer Dutton: Ceasar Lund Treating Tommie Bohlken/Extender: Melburn Hake, HOYT Weeks in Treatment: 3 Active Inactive ` Orientation to the Wound Care Program Nursing Diagnoses: Knowledge deficit related to the wound healing center program Goals: Patient/caregiver will verbalize understanding of the [place] Program Date Initiated: 12/26/2017 Target Resolution Date: 01/16/2018 Goal Status: Active Interventions: Provide education on orientation to the wound center Notes: ` Wound/Skin Impairment Nursing Diagnoses: Impaired tissue integrity Goals: Patient/caregiver will verbalize understanding of skin care regimen Date Initiated: 12/26/2017 Target Resolution Date: 01/16/2018 Goal Status: Active Ulcer/skin breakdown will have a volume reduction of 30% by week 4 Date Initiated: 12/26/2017 Target Resolution Date: 01/16/2018 Goal Status: Active Interventions: Assess patient/caregiver ability to obtain necessary supplies Assess patient/caregiver ability to perform ulcer/skin care regimen upon admission and  as needed Assess ulceration(s) every visit Treatment Activities: Skin care regimen initiated : 12/26/2017 Notes: Electronic Signature(s) Signed: 01/17/2018 8:47:36 AM By: Valentina Gu, Reiko D. ([id number]) Entered By: Roger Shelter on 01/16/2018 11:27:36 Misty Berry, Misty D. ([id number]) -------------------------------------------------------------------------------- Pain Assessment Details Patient Name: Misty Drape D. Date of Service: 01/16/2018 10:30 AM Medical Record Number: [id number] Patient Account Number: [id number] Date of Birth/Sex: 03-03-1935 (83 y.o. F) Treating RN: Ahmed Prima Primary Care Vint Pola: Ceasar Lund Other Clinician: Referring Adar Rase: Ceasar Lund Treating Linday Rhodes/Extender: Melburn Hake, HOYT Weeks in Treatment: 3 Active Problems Location of Pain Severity and Description of Pain Patient Has Paino No Site Locations Pain Management and Medication Current Pain Management: Electronic Signature(s) Signed: 01/16/2018 4:59:33 PM By: Alric Quan Entered By: Alric Quan on 01/16/2018 10:54:15 Misty Berry, Misty D. ([id number]) -------------------------------------------------------------------------------- Wound Assessment Details Patient Name: Misty Drape D. Date of Service: 01/16/2018 10:30 AM Medical Record Number: [id number] Patient Account Number: [id number] Date of Birth/Sex: 09-23-1934 (83 y.o. F) Treating RN: Ahmed Prima Primary Care Early Ord: Ceasar Lund Other Clinician: Referring Troi Bechtold: Ceasar Lund Treating Tamea Bai/Extender: STONE III, HOYT Weeks in Treatment: 3 Wound Status Wound Number: 1 Primary Trauma, Other Etiology: Wound Location: Left Lower Leg - Anterior Wound Open Wounding Event: Trauma Status: Date Acquired: 12/05/2017  Comorbid Cataracts, Asthma, Arrhythmia, Congestive Weeks Of Treatment: 3 History: Heart Failure, Hypertension, Osteoarthritis Clustered Wound:  No Photos Photo Uploaded By: Alric Quan on 01/17/2018 16:01:11 Wound Measurements Length: (cm) 0.9 Width: (cm) 1.5 Depth: (cm) 0.3 Area: (cm) 1.06 Volume: (cm) 0.318 % Reduction in Area: -29.7% % Reduction in Volume: 64.6% Epithelialization: None Tunneling: No Undermining: Yes Starting Position (o'clock): 10 Ending Position (o'clock): 11 Maximum Distance: (cm) 0.5 Wound Description Full Thickness Without Exposed Support Classification: Structures Wound Margin: Distinct, outline attached Exudate Large Amount: Exudate Type: Serous Exudate Color: amber Foul Odor After Cleansing: No Slough/Fibrino No Wound Bed Granulation Amount: Medium (34-66%) Exposed Structure Granulation Quality: Pink Fascia Exposed: No Necrotic Amount: Medium (34-66%) Fat Layer (Subcutaneous Tissue) Exposed: Yes Misty Berry, Misty D. ([id number]) Necrotic Quality: Adherent Slough Tendon Exposed: No Muscle Exposed: No Joint Exposed: No Bone Exposed: No Periwound Skin Texture Texture Color No Abnormalities Noted: No No Abnormalities Noted: No Scarring: Yes Erythema: Yes Erythema Location: Circumferential Moisture No Abnormalities Noted: No Temperature / Pain Temperature: No Abnormality Tenderness on Palpation: Yes Wound Preparation Ulcer Cleansing: Rinsed/Irrigated with Saline Topical Anesthetic Applied: Other: LIDOCAINE 4%, Electronic Signature(s) Signed: 01/16/2018 4:59:33 PM By: Alric Quan Entered By: Alric Quan on 01/16/2018 11:02:28 Misty Berry, Misty D. ([id number]) -------------------------------------------------------------------------------- Wound Assessment Details Patient Name: LIYANNA, CARTWRIGHT D. Date of Service: 01/16/2018 10:30 AM Medical Record Number: [id number] Patient Account Number: [id number] Date of Birth/Sex: 1934-12-27 (83 y.o. F) Treating RN: Ahmed Prima Primary Care Prescilla Monger: Ceasar Lund Other Clinician: Referring Bodhi Moradi: Ceasar Lund Treating Vasilisa Vore/Extender: Melburn Hake, HOYT Weeks in Treatment: 3 Wound Status Wound Number: 2 Primary Cellulitis Etiology: Wound Location: Left Lower Leg - Lateral Wound Open Wounding Event: Gradually Appeared Status: Date Acquired: 01/15/2018 Comorbid Cataracts, Asthma, Arrhythmia, Congestive Weeks Of Treatment: 0 History: Heart Failure, Hypertension, Osteoarthritis Clustered Wound: No Photos Photo Uploaded By: Alric Quan on 01/17/2018 16:01:12 Wound Measurements Length: (cm) 0.6 Width: (cm) 1 Depth: (cm) 0.1 Area: (cm) 0.471 Volume: (cm) 0.047 % Reduction in Area: % Reduction in Volume: Epithelialization: None Tunneling: No Undermining: No Wound Description Full Thickness Without Exposed Support Classification: Structures Wound Margin: Flat and Intact Exudate Large Amount: Exudate Type: Sanguinous Exudate Color: red Foul Odor After Cleansing: No Slough/Fibrino Yes Wound Bed Granulation Amount: Medium (34-66%) Exposed Structure Granulation Quality: Red Fascia Exposed: No Necrotic Amount: Medium (34-66%) Fat Layer (Subcutaneous Tissue) Exposed: No Necrotic Quality: Eschar Tendon Exposed: No Muscle Exposed: No Joint Exposed: No Bone Exposed: No Misty Berry, Misty D. ([id number]) Periwound Skin Texture Texture Color No Abnormalities Noted: No No Abnormalities Noted: No Erythema: Yes Moisture Erythema Location: Circumferential No Abnormalities Noted: No Temperature / Pain Temperature: No Abnormality Tenderness on Palpation: Yes Wound Preparation Ulcer Cleansing: Rinsed/Irrigated with Saline Topical Anesthetic Applied: Other: lidocaine 4%, Electronic Signature(s) Signed: 01/16/2018 4:59:33 PM By: Alric Quan Entered By: Alric Quan on 01/16/2018 11:00:44 Misty Berry, Misty D. ([id number]) -------------------------------------------------------------------------------- Vitals Details Patient Name: Misty Drape D. Date of Service:  01/16/2018 10:30 AM Medical Record Number: [id number] Patient Account Number: [id number] Date of Birth/Sex: [birth year] (83 y.o. F) Treating RN: Ahmed Prima Primary Care Keithen Capo: Ceasar Lund Other Clinician: Referring Takya Vandivier: Ceasar Lund Treating Amel Gianino/Extender: Melburn Hake, HOYT Weeks in Treatment: 3 Vital Signs Time Taken: 10:54 Temperature (F): 97.8 Height (in): 61 Pulse (bpm): 58 Weight (lbs): 179 Respiratory Rate (breaths/min): 18 Body Mass Index (BMI): 33.8 Blood Pressure (mmHg): 153/59 Reference Range: 80 - 120 mg / dl Electronic Signature(s) Signed: 01/16/2018 4:59:33 PM By: Alric Quan Entered By: Alric Quan on 01/16/2018 10:56:29

## 2018-01-26 NOTE — Progress Notes (Signed)
Misty Berry, Misty Berry ([id number]) Visit Report for 01/16/2018 Chief Complaint Document Details Patient Name: Misty Berry, Misty Berry. Date of Service: 01/16/2018 10:30 AM Medical Record Number: [id number] Patient Account Number: [id number] Date of Birth/Sex: [birth year] (83 y.o. F) Treating RN: Roger Shelter Primary Care Provider: Ceasar Lund Other Clinician: Referring Provider: Ceasar Lund Treating Provider/Extender: Melburn Hake, HOYT Weeks in Treatment: 3 Information Obtained from: Patient Chief Complaint Left anterior shin abscess with ulcer Electronic Signature(s) Signed: 01/16/2018 5:57:26 PM By: Worthy Keeler PA-C Entered By: Worthy Keeler on 01/16/2018 11:25:35 Berry, Misty D. ([id number]) -------------------------------------------------------------------------------- HPI Details Patient Name: Misty Berry, Misty Berry D. Date of Service: 01/16/2018 10:30 AM Medical Record Number: [id number] Patient Account Number: [id number] Date of Birth/Sex: 09/07/1934 (83 y.o. F) Treating RN: Roger Shelter Primary Care Provider: Ceasar Lund Other Clinician: Referring Provider: Ceasar Lund Treating Provider/Extender: Melburn Hake, HOYT Weeks in Treatment: 3 History of Present Illness HPI Description: 12/26/17 patient presents today with a past medical history positive for congestive heart failure, hypertension, bilateral lower extremity edema, venous insufficiency, and unfortunately for the past three weeks she has been dealing with the left anterior lower extremity dramatic wound after hitting her leg on the bottom of a free car door frame. This occurred roughly 3 weeks ago and patient states that she went to urgent care the same day where they performed in incision and drainage cleaning out quite a bit in the way of blood clots. However they did not recommend packing the wound and since that time she has developed a built-up for their blood clots in the region.  Unfortunately this continues to give her problems she was given clindamycin although at this point it appeared that she may have an infection that is definitely your theme of noted currently. There does not appear to be any evidence of systemic infection which is good news. On evaluation today patient does have discomfort although it's many with touching over the area and again it's again even mainly with specifically squeezing or attempting to get any of the blood clots out of the wound. There appears to be two small openings although that connects and there is one central opening with asymptomatic undermining underlying this. Nonetheless it appears this likely is going to need to be open board in order to see this fully and completely heal appropriately. No fevers, chills, nausea, or vomiting noted at this time. 01/02/18 on evaluation today the patient's left lower extremity ulcer appears to be doing significantly better. She did have some Slough on the surface of the wound but this was able to easily be debrided away with saline gauze utilizing a sterile Q-tip. With that being said she did not have any significant bleeding like last time overall I feel like she has tolerated the dressing changes very well. In general I feel like she's making great progress. With that being said I still feel like that she has some healing to go nonetheless but again I'm definitely pleased with the interval change from last week to this week. 01/09/18 on evaluation today the patient appears to be doing well in regard to her lower extremity ulcer. She continues to make good progress with good granulation noted at the base of the wound which is excellent news. With that being said she does note that her biggest concern right now is that she's not able to take a shower she states this has been a very big hardship for her she wonders how much longer it will be before she's able to get  in the shower. Otherwise she's not  having any significant pain which is good news definitely no significant discharge of bleeding. 01/16/18 on evaluation today patient actually appears to be doing worse in regard to her left lower extremity. She does note that the wound itself seems to be showing signs of improvement with new granulation. She still has some undermining. Nonetheless she actually appears to likely have some cellulitis noted which she states is something she tends to get somewhat frequently unfortunately. This is actually distal to where the wound is. There is a new small wound at the site as well. Electronic Signature(s) Signed: 01/16/2018 5:57:26 PM By: Worthy Keeler PA-C Entered By: Worthy Keeler on 01/16/2018 17:49:24 Misty Berry, Misty D. ([id number]) -------------------------------------------------------------------------------- Physical Exam Details Patient Name: Misty Berry, Misty D. Date of Service: 01/16/2018 10:30 AM Medical Record Number: [id number] Patient Account Number: [id number] Date of Birth/Sex: [birth year] (83 y.o. F) Treating RN: Roger Shelter Primary Care Provider: Ceasar Lund Other Clinician: Referring Provider: Ceasar Lund Treating Provider/Extender: STONE III, HOYT Weeks in Treatment: 3 Constitutional Well-nourished and well-hydrated in no acute distress. Respiratory normal breathing without difficulty. clear to auscultation bilaterally. Cardiovascular regular rate and rhythm with normal S1, S2. Psychiatric this patient is able to make decisions and demonstrates good insight into disease process. Alert and Oriented x 3. pleasant and cooperative. Notes Patient's wound show signs of good granulation at this point there does not appear to be any evidence of significant infection No fevers, chills, nausea, or vomiting noted at this time. Electronic Signature(s) Signed: 01/16/2018 5:57:26 PM By: Worthy Keeler PA-C Entered By: Worthy Keeler on 01/16/2018  17:50:00 Misty Berry, Misty D. ([id number]) -------------------------------------------------------------------------------- Physician Orders Details Patient Name: AMAIRANY, SCHUMPERT D. Date of Service: 01/16/2018 10:30 AM Medical Record Number: [id number] Patient Account Number: [id number] Date of Birth/Sex: 02/18/1935 (83 y.o. F) Treating RN: Roger Shelter Primary Care Provider: Ceasar Lund Other Clinician: Referring Provider: Ceasar Lund Treating Provider/Extender: Melburn Hake, HOYT Weeks in Treatment: 3 Verbal / Phone Orders: No Diagnosis Coding ICD-10 Coding Code Description L02.416 Cutaneous abscess of left lower limb L97.822 Non-pressure chronic ulcer of other part of left lower leg with fat layer exposed I87.2 Venous insufficiency (chronic) (peripheral) T36.46 Chronic systolic (congestive) heart failure I10 Essential (primary) hypertension Wound Cleansing Wound #1 Left,Anterior Lower Leg o Clean wound with Normal Saline. o May shower with protection. Anesthetic (add to Medication List) Wound #1 Left,Anterior Lower Leg o Topical Lidocaine 4% cream applied to wound bed prior to debridement (In Clinic Only). Primary Wound Dressing Wound #1 Left,Anterior Lower Leg o Silver Collagen - pack lightly into undermining, at the 6 oclock to the 1 oclock area of undermining Secondary Dressing Wound #1 Left,Anterior Lower Leg o Foam o Tegaderm - cover foam with tegaderm Dressing Change Frequency Wound #1 Left,Anterior Lower Leg o Change Dressing Monday, Wednesday, Friday Follow-up Appointments Wound #1 Left,Anterior Lower Leg o Return Appointment in 1 week. - may come next tuesday for appointment [place] [facility] [place] for [place] Nurse may visit PRN to address patientos wound care needs. o FACE TO FACE ENCOUNTER: MEDICARE and MEDICAID PATIENTS: I certify that this patient is  under my care and that I had a face-to-face encounter that meets the physician face-to-face encounter requirements with this Misty Berry, Misty Berry. ([id number]) patient on this date. The encounter with the patient was in whole or in part for the following MEDICAL CONDITION: (primary reason for [place]) MEDICAL NECESSITY:  I certify, that based on my findings, NURSING services are a medically necessary home health service. HOME BOUND STATUS: I certify that my clinical findings support that this patient is homebound (i.e., Due to illness or injury, pt requires aid of supportive devices such as crutches, cane, wheelchairs, walkers, the use of special transportation or the assistance of another person to leave their place of residence. There is a normal inability to leave the home and doing so requires considerable and taxing effort. Other absences are for medical reasons / religious services and are infrequent or of short duration when for other reasons). o If current dressing causes regression in wound condition, may D/C ordered dressing product/s and apply Normal Saline Moist Dressing daily until next [place] / Other MD appointment. [place] of regression in wound condition at [phone]. o Please direct any NON-WOUND related issues/requests for orders to patient's Primary Care Physician Patient Medications Allergies: methylprednisolone, peanut, Iodinated Contrast- Oral and IV Dye, loxaglate sodium, Solu-Medrol, vancomycin Notifications Medication Indication Start End clindamycin HCl 01/16/2018 DOSE 1 - oral 300 mg capsule - 1 capsule oral taken 3 times a day for 10 days Electronic Signature(s) Signed: 01/16/2018 12:48:23 PM By: Worthy Keeler PA-C Entered By: Worthy Keeler on 01/16/2018 12:48:23 Misty Berry, Misty D. ([id number]) -------------------------------------------------------------------------------- Problem List Details Patient Name: IESHA, SUMMERHILL  D. Date of Service: 01/16/2018 10:30 AM Medical Record Number: [id number] Patient Account Number: [id number] Date of Birth/Sex: Jun 30, 1934 (83 y.o. F) Treating RN: Roger Shelter Primary Care Provider: Ceasar Lund Other Clinician: Referring Provider: Ceasar Lund Treating Provider/Extender: Melburn Hake, HOYT Weeks in Treatment: 3 Active Problems ICD-10 Evaluated Encounter Code Description Active Date Today Diagnosis L02.416 Cutaneous abscess of left lower limb 12/27/2017 No Yes L97.822 Non-pressure chronic ulcer of other part of left lower leg with 12/27/2017 No Yes fat layer exposed I87.2 Venous insufficiency (chronic) (peripheral) 12/27/2017 No Yes V42.59 Chronic systolic (congestive) heart failure 12/27/2017 No Yes I10 Essential (primary) hypertension 12/27/2017 No Yes Inactive Problems Resolved Problems Electronic Signature(s) Signed: 01/16/2018 5:57:26 PM By: Worthy Keeler PA-C Entered By: Worthy Keeler on 01/16/2018 11:25:25 Misty Berry, Misty D. ([id number]) -------------------------------------------------------------------------------- Progress Note Details Patient Name: Misty Drape D. Date of Service: 01/16/2018 10:30 AM Medical Record Number: [id number] Patient Account Number: [id number] Date of Birth/Sex: Jun 13, 1935 (83 y.o. F) Treating RN: Roger Shelter Primary Care Provider: Ceasar Lund Other Clinician: Referring Provider: Ceasar Lund Treating Provider/Extender: Melburn Hake, HOYT Weeks in Treatment: 3 Subjective Chief Complaint Information obtained from Patient Left anterior shin abscess with ulcer History of Present Illness (HPI) 12/26/17 patient presents today with a past medical history positive for congestive heart failure, hypertension, bilateral lower extremity edema, venous insufficiency, and unfortunately for the past three weeks she has been dealing with the left anterior lower extremity dramatic wound after hitting  her leg on the bottom of a free car door frame. This occurred roughly 3 weeks ago and patient states that she went to urgent care the same day where they performed in incision and drainage cleaning out quite a bit in the way of blood clots. However they did not recommend packing the wound and since that time she has developed a built-up for their blood clots in the region. Unfortunately this continues to give her problems she was given clindamycin although at this point it appeared that she may have an infection that is definitely your theme of noted currently. There does not appear to be any evidence of systemic infection which is good news.  On evaluation today patient does have discomfort although it's many with touching over the area and again it's again even mainly with specifically squeezing or attempting to get any of the blood clots out of the wound. There appears to be two small openings although that connects and there is one central opening with asymptomatic undermining underlying this. Nonetheless it appears this likely is going to need to be open board in order to see this fully and completely heal appropriately. No fevers, chills, nausea, or vomiting noted at this time. 01/02/18 on evaluation today the patient's left lower extremity ulcer appears to be doing significantly better. She did have some Slough on the surface of the wound but this was able to easily be debrided away with saline gauze utilizing a sterile Q-tip. With that being said she did not have any significant bleeding like last time overall I feel like she has tolerated the dressing changes very well. In general I feel like she's making great progress. With that being said I still feel like that she has some healing to go nonetheless but again I'm definitely pleased with the interval change from last week to this week. 01/09/18 on evaluation today the patient appears to be doing well in regard to her lower extremity ulcer.  She continues to make good progress with good granulation noted at the base of the wound which is excellent news. With that being said she does note that her biggest concern right now is that she's not able to take a shower she states this has been a very big hardship for her she wonders how much longer it will be before she's able to get in the shower. Otherwise she's not having any significant pain which is good news definitely no significant discharge of bleeding. 01/16/18 on evaluation today patient actually appears to be doing worse in regard to her left lower extremity. She does note that the wound itself seems to be showing signs of improvement with new granulation. She still has some undermining. Nonetheless she actually appears to likely have some cellulitis noted which she states is something she tends to get somewhat frequently unfortunately. This is actually distal to where the wound is. There is a new small wound at the site as well. Patient History Information obtained from Patient. Family History Heart Disease - Father, No family history of Cancer, Diabetes, Hereditary Spherocytosis, Hypertension, Kidney Disease, Lung Disease, Seizures, Stroke, Thyroid Problems, Tuberculosis. Misty Berry, Misty Berry ([id number]) Social History Former smoker - quit 1972, Marital Status - Divorced, Alcohol Use - Never, Drug Use - No History, Caffeine Use - Never. Review of Systems (ROS) Constitutional Symptoms (General Health) Denies complaints or symptoms of Fever, Chills. Respiratory The patient has no complaints or symptoms. Cardiovascular The patient has no complaints or symptoms. Psychiatric The patient has no complaints or symptoms. Objective Constitutional Well-nourished and well-hydrated in no acute distress. Vitals Time Taken: 10:54 AM, Height: 61 in, Weight: 179 lbs, BMI: 33.8, Temperature: 97.8 F, Pulse: 58 bpm, Respiratory Rate: 18 breaths/min, Blood Pressure: 153/59  mmHg. Respiratory normal breathing without difficulty. clear to auscultation bilaterally. Cardiovascular regular rate and rhythm with normal S1, S2. Psychiatric this patient is able to make decisions and demonstrates good insight into disease process. Alert and Oriented x 3. pleasant and cooperative. General Notes: Patient's wound show signs of good granulation at this point there does not appear to be any evidence of significant infection No fevers, chills, nausea, or vomiting noted at this time. Integumentary (Hair, Skin) Wound #1  status is Open. Original cause of wound was Trauma. The wound is located on the Left,Anterior Lower Leg. The wound measures 0.9cm length x 1.5cm width x 0.3cm depth; 1.06cm^2 area and 0.318cm^3 volume. There is Fat Layer (Subcutaneous Tissue) Exposed exposed. There is no tunneling noted, however, there is undermining starting at 10:00 and ending at 11:00 with a maximum distance of 0.5cm. There is a large amount of serous drainage noted. The wound margin is distinct with the outline attached to the wound base. There is medium (34-66%) pink granulation within the wound bed. There is a medium (34-66%) amount of necrotic tissue within the wound bed including Adherent Slough. The periwound skin appearance exhibited: Scarring, Erythema. The surrounding wound skin color is noted with erythema which is circumferential. Periwound temperature was noted as No Abnormality. The periwound has tenderness on palpation. Wound #2 status is Open. Original cause of wound was Gradually Appeared. The wound is located on the Left,Lateral Lower Leg. The wound measures 0.6cm length x 1cm width x 0.1cm depth; 0.471cm^2 area and 0.047cm^3 volume. There is no tunneling or undermining noted. There is a large amount of sanguinous drainage noted. The wound margin is flat and intact. There is medium (34-66%) red granulation within the wound bed. There is a medium (34-66%) amount of necrotic  tissue within the wound bed including Eschar. The periwound skin appearance exhibited: Erythema. The surrounding wound skin Misty Berry, Misty D. ([id number]) color is noted with erythema which is circumferential. Periwound temperature was noted as No Abnormality. The periwound has tenderness on palpation. Assessment Active Problems ICD-10 Cutaneous abscess of left lower limb Non-pressure chronic ulcer of other part of left lower leg with fat layer exposed Venous insufficiency (chronic) (peripheral) Chronic systolic (congestive) heart failure Essential (primary) hypertension Plan Wound Cleansing: Wound #1 Left,Anterior Lower Leg: Clean wound with Normal Saline. May shower with protection. Anesthetic (add to Medication List): Wound #1 Left,Anterior Lower Leg: Topical Lidocaine 4% cream applied to wound bed prior to debridement (In Clinic Only). Primary Wound Dressing: Wound #1 Left,Anterior Lower Leg: Silver Collagen - pack lightly into undermining, at the 6 oclock to the 1 oclock area of undermining Secondary Dressing: Wound #1 Left,Anterior Lower Leg: Foam Tegaderm - cover foam with tegaderm Dressing Change Frequency: Wound #1 Left,Anterior Lower Leg: Change Dressing Monday, Wednesday, Friday Follow-up Appointments: Wound #1 Left,Anterior Lower Leg: Return Appointment in 1 week. - may come next tuesday for appointment Home Health: Wound #1 Left,Anterior Lower Leg: [place] for [place] Nurse may visit PRN to address patient s wound care needs. FACE TO FACE ENCOUNTER: MEDICARE and MEDICAID PATIENTS: I certify that this patient is under my care and that I had a face-to-face encounter that meets the physician face-to-face encounter requirements with this patient on this date. The encounter with the patient was in whole or in part for the following MEDICAL CONDITION: (primary reason for [place]) MEDICAL NECESSITY: I certify, that based on my  findings, NURSING services are a medically necessary home health service. HOME BOUND STATUS: I certify that my clinical findings support that this patient is homebound (i.e., Due to illness or injury, pt requires aid of supportive devices such as crutches, cane, wheelchairs, walkers, the use of special transportation or the assistance of another person to leave their place of residence. There is a normal inability to leave the home and doing so requires considerable and taxing effort. Other absences are for medical reasons / religious services and are infrequent or of short duration  when for other reasons). If current dressing causes regression in wound condition, may D/C ordered dressing product/s and apply Normal Saline Misty Berry, Tovah D. ([id number]) Moist Dressing daily until next [place] / Other MD appointment. [place] of regression in wound condition at [phone]. Please direct any NON-WOUND related issues/requests for orders to patient's Primary Care Physician The following medication(s) was prescribed: clindamycin HCl oral 300 mg capsule 1 1 capsule oral taken 3 times a day for 10 days starting 01/16/2018 I'm gonna suggest currently that we continue with the current wound care measures for the next week. The patient is in agreement with the plan. Subsequently I am going to go ahead and send her in a prescription for clindamycin that she has done well with this in the past. She's in agreement that plan. We will see were things stand at follow-up. Please see above for specific wound care orders. We will see patient for re-evaluation in 1 week(s) here in the clinic. If anything worsens or changes patient will contact our office for additional recommendations. Electronic Signature(s) Signed: 01/16/2018 5:57:26 PM By: Worthy Keeler PA-C Entered By: Worthy Keeler on 01/16/2018 17:50:37 Orin, Hadlea D.  ([id number]) -------------------------------------------------------------------------------- ROS/PFSH Details Patient Name: AVRIEL, KANDEL D. Date of Service: 01/16/2018 10:30 AM Medical Record Number: [id number] Patient Account Number: [id number] Date of Birth/Sex: 06-07-1935 (83 y.o. F) Treating RN: Roger Shelter Primary Care Provider: Ceasar Lund Other Clinician: Referring Provider: Ceasar Lund Treating Provider/Extender: Melburn Hake, HOYT Weeks in Treatment: 3 Information Obtained From Patient Wound History Do you currently have one or more open woundso Yes How many open wounds do you currently haveo 1 Approximately how long have you had your woundso 3 weeks How have you been treating your wound(s) until nowo prescription cream (unknown) Has your wound(s) ever healed and then re-openedo No Have you had any lab work done in the past montho No Have you tested positive for an antibiotic resistant organism (MRSA, VRE)o No Have you tested positive for osteomyelitis (bone infection)o No Have you had any tests for circulation on your legso No Have you had other problems associated with your woundso Swelling Constitutional Symptoms (General Health) Complaints and Symptoms: Negative for: Fever; Chills Eyes Medical History: Positive for: Cataracts - surgery Respiratory Complaints and Symptoms: No Complaints or Symptoms Medical History: Positive for: Asthma Cardiovascular Complaints and Symptoms: No Complaints or Symptoms Medical History: Positive for: Arrhythmia - hx atrial tachycardia; Congestive Heart Failure; Hypertension Endocrine Medical History: Negative for: Type I Diabetes; Type II Diabetes Musculoskeletal Medical History: Positive for: Osteoarthritis TERESITA, FANTON ([id number]) Psychiatric Complaints and Symptoms: No Complaints or Symptoms HBO Extended History Items Eyes: Cataracts Immunizations Pneumococcal Vaccine: Received  Pneumococcal Vaccination: Yes Implantable Devices Family and Social History Cancer: No; Diabetes: No; Heart Disease: Yes - Father; Hereditary Spherocytosis: No; Hypertension: No; Kidney Disease: No; Lung Disease: No; Seizures: No; Stroke: No; Thyroid Problems: No; Tuberculosis: No; Former smoker - quit 1972; Marital Status - Divorced; Alcohol Use: Never; Drug Use: No History; Caffeine Use: Never; Financial Concerns: No; Food, Clothing or Shelter Needs: No; Support System Lacking: No; Transportation Concerns: No; Advanced Directives: Yes (Not Provided); Patient does not want information on Advanced Directives; Do not resuscitate: No; Living Will: Yes (Not Provided); Medical Power of Attorney: Yes - [name] (brother) (Not Provided) Physician Affirmation I have reviewed and agree with the above information. Electronic Signature(s) Signed: 01/16/2018 5:57:26 PM By: Worthy Keeler PA-C Signed: 01/17/2018 8:47:36 AM By: Roger Shelter Entered By: Joaquim Lai  IIIMargarita Grizzle on 01/16/2018 17:49:46 Linder, Sharnelle D. ([id number]) -------------------------------------------------------------------------------- SuperBill Details Patient Name: ANAELI, CORNWALL. Date of Service: 01/16/2018 Medical Record Number: [id number] Patient Account Number: [id number] Date of Birth/Sex: Aug 08, 1934 (83 y.o. F) Treating RN: Roger Shelter Primary Care Provider: Ceasar Lund Other Clinician: Referring Provider: Ceasar Lund Treating Provider/Extender: Melburn Hake, HOYT Weeks in Treatment: 3 Diagnosis Coding ICD-10 Codes Code Description L02.416 Cutaneous abscess of left lower limb L97.822 Non-pressure chronic ulcer of other part of left lower leg with fat layer exposed I87.2 Venous insufficiency (chronic) (peripheral) L57.26 Chronic systolic (congestive) heart failure I10 Essential (primary) hypertension Facility Procedures CPT4 Code: [id number] Description: 16384 - WOUND CARE VISIT-LEV 2 EST  PT Modifier: Quantity: 1 Physician Procedures CPT4 Code Description: [id number] 32122 - WC PHYS LEVEL 3 - EST PT ICD-10 Diagnosis Description L02.416 Cutaneous abscess of left lower limb L97.822 Non-pressure chronic ulcer of other part of left lower leg wit I87.2 Venous insufficiency (chronic)  (peripheral) Q82.50 Chronic systolic (congestive) heart failure Modifier: h fat layer expos Quantity: 1 ed Electronic Signature(s) Signed: 01/16/2018 5:57:26 PM By: Worthy Keeler PA-C Entered By: Worthy Keeler on 01/16/2018 17:51:04

## 2018-01-30 ENCOUNTER — Ambulatory Visit: Payer: Medicare Other | Admitting: Physician Assistant

## 2018-02-03 ENCOUNTER — Encounter: Payer: Medicare Other | Admitting: Physician Assistant

## 2018-02-03 DIAGNOSIS — L97822 Non-pressure chronic ulcer of other part of left lower leg with fat layer exposed: Secondary | ICD-10-CM | POA: Diagnosis not present

## 2018-02-05 NOTE — Progress Notes (Signed)
Misty Berry ([id number]) Visit Report for 01/24/2018 Arrival Information Details Patient Name: Misty Berry, Misty Berry. Date of Service: 01/24/2018 11:00 AM Medical Record Number: [id number] Patient Account Number: [id number] Date of Birth/Sex: 1935/03/23 (83 y.o. F) Treating RN: Secundino Ginger Primary Care Orlando Devereux: Ceasar Lund Other Clinician: Referring Britian Jentz: Ceasar Lund Treating Lakin Romer/Extender: Cathie Olden in Treatment: 4 Visit Information History Since Last Visit Added or deleted any medications: No Patient Arrived: Walker Any new allergies or adverse reactions: No Arrival Time: 11:02 Had a fall or experienced change in No Accompanied By: self activities of daily living that may affect Transfer Assistance: None risk of falls: Patient Identification Verified: Yes Signs or symptoms of abuse/neglect since last visito No Secondary Verification Process Completed: Yes Hospitalized since last visit: No Patient Requires Transmission-Based Precautions: No Implantable device outside of the clinic excluding No Patient Has Alerts: No cellular tissue based products placed in the center since last visit: Has Dressing in Place as Prescribed: Yes Pain Present Now: No Electronic Signature(s) Signed: 01/24/2018 4:00:36 PM By: Secundino Ginger Entered By: Secundino Ginger on 01/24/2018 11:08:21 Misty Berry, Misty Berry. ([id number]) -------------------------------------------------------------------------------- Encounter Discharge Information Details Patient Name: Misty Berry Berry. Date of Service: 01/24/2018 11:00 AM Medical Record Number: [id number] Patient Account Number: [id number] Date of Birth/Sex: [birth year] (83 y.o. F) Treating RN: Roger Shelter Primary Care Tafari Humiston: Ceasar Lund Other Clinician: Referring Laquan Beier: Ceasar Lund Treating Suzy Kugel/Extender: Cathie Olden in Treatment: 4 Encounter Discharge Information Items Discharge Condition:  Stable Ambulatory Status: Walker Discharge Destination: Home Transportation: Private Auto Schedule Follow-up Appointment: Yes Clinical Summary of Care: Electronic Signature(s) Signed: 01/25/2018 9:21:22 AM By: Roger Shelter Entered By: Roger Shelter on 01/24/2018 12:41:56 Misty Berry. ([id number]) -------------------------------------------------------------------------------- Lower Extremity Assessment Details Patient Name: Misty Berry Berry. Date of Service: 01/24/2018 11:00 AM Medical Record Number: [id number] Patient Account Number: [id number] Date of Birth/Sex: [birth year] (83 y.o. F) Treating RN: Secundino Ginger Primary Care Jerni Selmer: Ceasar Lund Other Clinician: Referring Ansleigh Safer: Ceasar Lund Treating Rhaelyn Giron/Extender: Cathie Olden in Treatment: 4 Edema Assessment Assessed: [Left: No] [Right: No] [Left: Edema] [Right: :] Calf Left: Right: Point of Measurement: 33 cm From Medial Instep 37 cm cm Ankle Left: Right: Point of Measurement: 10 cm From Medial Instep 22 cm cm Vascular Assessment Claudication: Claudication Assessment [Left:None] Pulses: Dorsalis Pedis Palpable: [Left:Yes] Posterior Tibial Extremity colors, hair growth, and conditions: Extremity Color: [Left:Hyperpigmented] Hair Growth on Extremity: [Left:No] Temperature of Extremity: [Left:Warm] Capillary Refill: [Left:< 3 seconds] Toe Nail Assessment Left: Right: Thick: Yes Discolored: Yes Deformed: Yes Improper Length and Hygiene: No Electronic Signature(s) Signed: 01/24/2018 4:00:36 PM By: Secundino Ginger Entered By: Secundino Ginger on 01/24/2018 11:30:18 Misty Berry, Misty Berry. ([id number]) -------------------------------------------------------------------------------- Multi Wound Chart Details Patient Name: Misty Berry. Date of Service: 01/24/2018 11:00 AM Medical Record Number: [id number] Patient Account Number: [id number] Date of Birth/Sex: September 14, 1934 (83 y.o. F) Treating RN:  Cornell Barman Primary Care Thatiana Renbarger: Ceasar Lund Other Clinician: Referring Judeen Geralds: Ceasar Lund Treating Aaditya Letizia/Extender: Cathie Olden in Treatment: 4 Vital Signs Height(in): 61 Pulse(bpm): 13 Weight(lbs): 179 Blood Pressure(mmHg): 140/80 Body Mass Index(BMI): 34 Temperature(F): 97.9 Respiratory Rate 18 (breaths/min): Photos: [N/A:N/A] Wound Location: Left Lower Leg - Anterior Left Lower Leg - Lateral N/A Wounding Event: Trauma Gradually Appeared N/A Primary Etiology: Trauma, Other Cellulitis N/A Comorbid History: Cataracts, Asthma, Cataracts, Asthma, N/A Arrhythmia, Congestive Heart Arrhythmia, Congestive Heart Failure, Hypertension, Failure, Hypertension, Osteoarthritis Osteoarthritis Date Acquired: 12/05/2017 01/15/2018 N/A Weeks of Treatment: 4 1 N/A Wound Status: Open Open N/A Measurements L x W x Berry 0.8x1.4x0.3  0.1x0.1x0.1 N/A (cm) Area (cm) : 0.88 0.008 N/A Volume (cm) : 0.264 0.001 N/A % Reduction in Area: -7.70% 98.30% N/A % Reduction in Volume: 70.60% 97.90% N/A Classification: Full Thickness Without Full Thickness Without N/A Exposed Support Structures Exposed Support Structures Exudate Amount: Large None Present N/A Exudate Type: Serous N/A N/A Exudate Color: amber N/A N/A Wound Margin: Distinct, outline attached Flat and Intact N/A Granulation Amount: Medium (34-66%) None Present (0%) N/A Granulation Quality: Pink N/A N/A Necrotic Amount: Medium (34-66%) None Present (0%) N/A Exposed Structures: Fat Layer (Subcutaneous Fascia: No N/A Tissue) Exposed: Yes Fat Layer (Subcutaneous Fascia: No Tissue) Exposed: No Tendon: No Tendon: No Misty Berry. ([id number]) Muscle: No Muscle: No Joint: No Joint: No Bone: No Bone: No Epithelialization: None None N/A Periwound Skin Texture: Scarring: Yes No Abnormalities Noted N/A Periwound Skin Moisture: No Abnormalities Noted No Abnormalities Noted N/A Periwound Skin  Color: Erythema: Yes Erythema: Yes N/A Erythema Location: Circumferential Circumferential N/A Temperature: No Abnormality No Abnormality N/A Tenderness on Palpation: Yes Yes N/A Wound Preparation: Ulcer Cleansing: Ulcer Cleansing: N/A Rinsed/Irrigated with Saline Rinsed/Irrigated with Saline Topical Anesthetic Applied: Other: LIDOCAINE 4% Treatment Notes Electronic Signature(s) Signed: 01/24/2018 6:04:06 PM By: Gretta Cool, BSN, RN, CWS, Kim RN, BSN Entered By: Gretta Cool, BSN, RN, CWS, Kim on 01/24/2018 11:38:44 Misty Berry, Misty Berry ([id number]) -------------------------------------------------------------------------------- Multi-Disciplinary Care Plan Details Patient Name: MAVI, UN. Date of Service: 01/24/2018 11:00 AM Medical Record Number: [id number] Patient Account Number: [id number] Date of Birth/Sex: [birth year] (83 y.o. F) Treating RN: Cornell Barman Primary Care Vesper Trant: Ceasar Lund Other Clinician: Referring Barrington Worley: Ceasar Lund Treating Zanden Colver/Extender: Cathie Olden in Treatment: 4 Active Inactive ` Orientation to the Wound Care Program Nursing Diagnoses: Knowledge deficit related to the wound healing center program Goals: Patient/caregiver will verbalize understanding of the [facility] Date Initiated: 12/26/2017 Target Resolution Date: 01/16/2018 Goal Status: Active Interventions: Provide education on orientation to the wound center Notes: ` Wound/Skin Impairment Nursing Diagnoses: Impaired tissue integrity Goals: Patient/caregiver will verbalize understanding of skin care regimen Date Initiated: 12/26/2017 Target Resolution Date: 01/16/2018 Goal Status: Active Ulcer/skin breakdown will have a volume reduction of 30% by week 4 Date Initiated: 12/26/2017 Target Resolution Date: 01/16/2018 Goal Status: Active Interventions: Assess patient/caregiver ability to obtain necessary supplies Assess patient/caregiver ability to  perform ulcer/skin care regimen upon admission and as needed Assess ulceration(s) every visit Treatment Activities: Skin care regimen initiated : 12/26/2017 Notes: Electronic Signature(s) Signed: 01/24/2018 6:04:06 PM By: Gretta Cool, BSN, RN, CWS, Kim RN, BSN [address] Tenea Berry. ([id number]) Entered By: Gretta Cool, BSN, RN, CWS, Kim on 01/24/2018 11:38:11 Misty Berry, Misty Berry. ([id number]) -------------------------------------------------------------------------------- Pain Assessment Details Patient Name: CARIA, TRANSUE Berry. Date of Service: 01/24/2018 11:00 AM Medical Record Number: [id number] Patient Account Number: [id number] Date of Birth/Sex: 26-Dec-1934 (83 y.o. F) Treating RN: Secundino Ginger Primary Care Mirissa Lopresti: Ceasar Lund Other Clinician: Referring Lynn Recendiz: Ceasar Lund Treating Derrika Ruffalo/Extender: Cathie Olden in Treatment: 4 Active Problems Location of Pain Severity and Description of Pain Patient Has Paino No Site Locations Pain Management and Medication Current Pain Management: Goals for Pain Management pt denies any pain. Electronic Signature(s) Signed: 01/24/2018 4:00:36 PM By: Secundino Ginger Entered By: Secundino Ginger on 01/24/2018 11:09:09 Misty Berry, Misty Arriba Berry. ([id number]) -------------------------------------------------------------------------------- Patient/Caregiver Education Details Patient Name: MAGUIRE, KILLMER Berry. Date of Service: 01/24/2018 11:00 AM Medical Record Number: [id number] Patient Account Number: [id number] Date of Birth/Gender: 11-Jul-1934 (83 y.o. F) Treating RN: Roger Shelter Primary Care Physician: Ceasar Lund Other Clinician: Referring Physician: Ceasar Lund Treating Physician/Extender: Lawanda Cousins  Weeks in Treatment: 4 Education Assessment Education Provided To: Patient Education Topics Provided Wound Debridement: Handouts: Wound Debridement Methods: Explain/Verbal Responses: State content correctly Wound/Skin  Impairment: Handouts: Caring for Your Ulcer Methods: Explain/Verbal Responses: State content correctly Electronic Signature(s) Signed: 01/25/2018 9:21:22 AM By: Roger Shelter Entered By: Roger Shelter on 01/24/2018 12:43:54 Misty Berry, Misty Berry. ([id number]) -------------------------------------------------------------------------------- Wound Assessment Details Patient Name: KALIKA, SMAY Berry. Date of Service: 01/24/2018 11:00 AM Medical Record Number: [id number] Patient Account Number: [id number] Date of Birth/Sex: 02-Feb-1935 (83 y.o. F) Treating RN: Secundino Ginger Primary Care Suly Vukelich: Ceasar Lund Other Clinician: Referring Clydell Sposito: Ceasar Lund Treating Zaxton Angerer/Extender: Cathie Olden in Treatment: 4 Wound Status Wound Number: 1 Primary Trauma, Other Etiology: Wound Location: Left Lower Leg - Anterior Wound Open Wounding Event: Trauma Status: Date Acquired: 12/05/2017 Comorbid Cataracts, Asthma, Arrhythmia, Congestive Weeks Of Treatment: 4 History: Heart Failure, Hypertension, Osteoarthritis Clustered Wound: No Photos Photo Uploaded By: Secundino Ginger on 01/24/2018 11:34:42 Wound Measurements Length: (cm) 0.8 Width: (cm) 1.4 Depth: (cm) 0.3 Area: (cm) 0.88 Volume: (cm) 0.264 % Reduction in Area: -7.7% % Reduction in Volume: 70.6% Epithelialization: None Tunneling: No Undermining: No Wound Description Full Thickness Without Exposed Support Foul Odo Classification: Structures Slough/F Wound Margin: Distinct, outline attached Exudate Large Amount: Exudate Type: Serous Exudate Color: amber r After Cleansing: No ibrino No Wound Bed Granulation Amount: Medium (34-66%) Exposed Structure Granulation Quality: Pink Fascia Exposed: No Necrotic Amount: Medium (34-66%) Fat Layer (Subcutaneous Tissue) Exposed: Yes Necrotic Quality: Adherent Slough Tendon Exposed: No Muscle Exposed: No Joint Exposed: No Bone Exposed: No Misty Berry, Misty Berry.  ([id number]) Periwound Skin Texture Texture Color No Abnormalities Noted: No No Abnormalities Noted: No Scarring: Yes Erythema: Yes Erythema Location: Circumferential Moisture No Abnormalities Noted: No Temperature / Pain Temperature: No Abnormality Tenderness on Palpation: Yes Wound Preparation Ulcer Cleansing: Rinsed/Irrigated with Saline Topical Anesthetic Applied: Other: LIDOCAINE 4%, Treatment Notes Wound #1 (Left, Anterior Lower Leg) 1. Cleansed with: Clean wound with Normal Saline 2. Anesthetic Topical Lidocaine 4% cream to wound bed prior to debridement 4. Dressing Applied: Prisma Ag 5. Secondary Dressing Applied Foam Tegaderm Electronic Signature(s) Signed: 01/24/2018 4:00:36 PM By: Secundino Ginger Entered By: Secundino Ginger on 01/24/2018 11:24:45 Misty Berry, Misty Berry. ([id number]) -------------------------------------------------------------------------------- Wound Assessment Details Patient Name: KENDRICK, REMIGIO Berry. Date of Service: 01/24/2018 11:00 AM Medical Record Number: [id number] Patient Account Number: [id number] Date of Birth/Sex: [birth year] (83 y.o. F) Treating RN: Roger Shelter Primary Care Trentin Knappenberger: Ceasar Lund Other Clinician: Referring Nakhia Levitan: Ceasar Lund Treating Akhil Piscopo/Extender: Cathie Olden in Treatment: 4 Wound Status Wound Number: 2 Primary Cellulitis Etiology: Wound Location: Left, Lateral Lower Leg Wound Healed - Epithelialized Wounding Event: Gradually Appeared Status: Date Acquired: 01/15/2018 Comorbid Cataracts, Asthma, Arrhythmia, Congestive Weeks Of Treatment: 1 History: Heart Failure, Hypertension, Osteoarthritis Clustered Wound: No Photos Photo Uploaded By: Secundino Ginger on 01/24/2018 11:34:43 Wound Measurements Length: (cm) 0 % Reduc Width: (cm) 0 % Reduc Depth: (cm) 0 Epithel Area: (cm) 0 Tunnel Volume: (cm) 0 Underm tion in Area: 100% tion in Volume: 100% ialization: None ing: No ining: No Wound  Description Full Thickness Without Exposed Support Foul Od Classification: Structures Slough/ Wound Margin: Flat and Intact Exudate None Present Amount: or After Cleansing: No Fibrino No Wound Bed Granulation Amount: None Present (0%) Exposed Structure Necrotic Amount: None Present (0%) Fascia Exposed: No Fat Layer (Subcutaneous Tissue) Exposed: No Tendon Exposed: No Muscle Exposed: No Joint Exposed: No Bone Exposed: No Periwound Skin Texture Texture Color Vardaman, Kamayah Berry. ([id number]) No Abnormalities Noted: No No Abnormalities  Noted: Yes Erythema Location: Circumferential Moisture No Abnormalities Noted: No Temperature / Pain Temperature: No Abnormality Tenderness on Palpation: Yes Wound Preparation Ulcer Cleansing: Rinsed/Irrigated with Saline Electronic Signature(s) Signed: 01/25/2018 9:21:22 AM By: Roger Shelter Entered By: Roger Shelter on 01/24/2018 11:44:57 Misty Berry, Misty Berry. ([id number]) -------------------------------------------------------------------------------- Wound Assessment Details Patient Name: LANDRIE, BEALE Berry. Date of Service: 01/24/2018 11:00 AM Medical Record Number: [id number] Patient Account Number: [id number] Date of Birth/Sex: [birth year] (83 y.o. F) Treating RN: Montey Hora Primary Care Keyton Bhat: Ceasar Lund Other Clinician: Referring Adeliz Tonkinson: Ceasar Lund Treating Charm Stenner/Extender: Cathie Olden in Treatment: 4 Wound Status Wound Number: 3 Primary Etiology: Venous Leg Ulcer Wound Location: Left, Distal, Anterior Lower Leg Wound Status: Open Wounding Event: Gradually Appeared Date Acquired: 01/24/2018 Weeks Of Treatment: 0 Clustered Wound: No Photos Photo Uploaded By: Secundino Ginger on 01/24/2018 11:51:53 Wound Measurements Length: (cm) Width: (cm) Depth: (cm) Area: (cm) Volume: (cm) 0.7 % Reduction in Area: 0.5 % Reduction in Volume: 0.1 0.275 0.027 Periwound Skin Texture Texture Color No  Abnormalities Noted: No No Abnormalities Noted: No Moisture No Abnormalities Noted: No Electronic Signature(s) Signed: 01/24/2018 4:48:53 PM By: Montey Hora Entered By: Montey Hora on 01/24/2018 11:48:19 Misty Berry, Misty Berry. ([id number]) -------------------------------------------------------------------------------- Wound Assessment Details Patient Name: Misty Berry. Date of Service: 01/24/2018 11:00 AM Medical Record Number: [id number] Patient Account Number: [id number] Date of Birth/Sex: [birth year] (83 y.o. F) Treating RN: Montey Hora Primary Care Ennio Houp: Ceasar Lund Other Clinician: Referring Quintez Maselli: Ceasar Lund Treating Hopie Pellegrin/Extender: Cathie Olden in Treatment: 4 Wound Status Wound Number: 3 Primary Venous Leg Ulcer Etiology: Wound Location: Left Lower Leg - Anterior, Distal Wound Open Wounding Event: Gradually Appeared Status: Date Acquired: 01/24/2018 Comorbid Cataracts, Asthma, Arrhythmia, Congestive Weeks Of Treatment: 0 History: Heart Failure, Hypertension, Osteoarthritis Clustered Wound: No Photos Photo Uploaded By: Secundino Ginger on 01/24/2018 15:31:30 Wound Measurements Length: (cm) 0.7 Width: (cm) 0.5 Depth: (cm) 0.1 Area: (cm) 0.275 Volume: (cm) 0.027 % Reduction in Area: 0% % Reduction in Volume: 0% Epithelialization: None Tunneling: No Undermining: No Wound Description Classification: Partial Thickness Foul Odo Wound Margin: Flat and Intact Slough/F Exudate Amount: Large Exudate Type: Serous Exudate Color: amber r After Cleansing: No ibrino No Wound Bed Granulation Amount: Large (67-100%) Exposed Structure Granulation Quality: Pink Fascia Exposed: No Necrotic Amount: None Present (0%) Fat Layer (Subcutaneous Tissue) Exposed: No Tendon Exposed: No Muscle Exposed: No Joint Exposed: No Bone Exposed: No Periwound Skin Texture Heart, Mysty Berry. ([id number]) Texture Color No Abnormalities Noted: No No  Abnormalities Noted: No Callus: No Atrophie Blanche: No Crepitus: No Cyanosis: No Excoriation: No Ecchymosis: No Induration: No Erythema: No Rash: No Hemosiderin Staining: No Scarring: No Mottled: No Pallor: No Moisture Rubor: No No Abnormalities Noted: No Dry / Scaly: No Temperature / Pain Maceration: No Temperature: No Abnormality Wound Preparation Ulcer Cleansing: Rinsed/Irrigated with Saline Topical Anesthetic Applied: None Treatment Notes Wound #3 (Left, Distal, Anterior Lower Leg) 1. Cleansed with: Clean wound with Normal Saline 2. Anesthetic Topical Lidocaine 4% cream to wound bed prior to debridement 4. Dressing Applied: Prisma Ag 5. Secondary Dressing Applied Foam Tegaderm Electronic Signature(s) Signed: 01/24/2018 11:49:08 AM By: Montey Hora Entered By: Montey Hora on 01/24/2018 11:49:08 Brilliant, Leighana Berry. ([id number]) -------------------------------------------------------------------------------- Vitals Details Patient Name: Misty Berry. Date of Service: 01/24/2018 11:00 AM Medical Record Number: [id number] Patient Account Number: [id number] Date of Birth/Sex: 04/30/1935 (83 y.o. F) Treating RN: Secundino Ginger Primary Care Jeilyn Reznik: Ceasar Lund Other Clinician: Referring Myrtle Haller: Ceasar Lund Treating Michalle Rademaker/Extender: Cathie Olden in Treatment: 4  Vital Signs Time Taken: 11:00 Temperature (F): 97.9 Height (in): 61 Pulse (bpm): 58 Weight (lbs): 179 Respiratory Rate (breaths/min): 18 Body Mass Index (BMI): 33.8 Blood Pressure (mmHg): 140/80 Reference Range: 80 - 120 mg / dl Notes BP taken manual. Electronic Signature(s) Signed: 01/24/2018 4:00:36 PM By: Secundino Ginger Entered By: Secundino Ginger on 01/24/2018 11:17:22

## 2018-02-05 NOTE — Progress Notes (Signed)
CAILIE, BOSSHART ([id number]) Visit Report for 01/24/2018 Chief Complaint Document Details Patient Name: Misty Berry, Misty Berry. Date of Service: 01/24/2018 11:00 AM Medical Record Number: [id number] Patient Account Number: [id number] Date of Birth/Sex: 01-24-1935 (83 y.o. F) Treating RN: Ahmed Prima Primary Care Provider: Ceasar Lund Other Clinician: Referring Provider: Ceasar Lund Treating Provider/Extender: Cathie Olden in Treatment: 4 Information Obtained from: Patient Chief Complaint LLE Electronic Signature(s) Signed: 01/24/2018 12:47:01 PM By: Lawanda Cousins Entered By: Lawanda Cousins on 01/24/2018 12:47:00 Martell, Krithika D. ([id number]) -------------------------------------------------------------------------------- Debridement Details Patient Name: Misty Drape D. Date of Service: 01/24/2018 11:00 AM Medical Record Number: [id number] Patient Account Number: [id number] Date of Birth/Sex: Apr 18, 1935 (83 y.o. F) Treating RN: Cornell Barman Primary Care Provider: Ceasar Lund Other Clinician: Referring Provider: Ceasar Lund Treating Provider/Extender: Cathie Olden in Treatment: 4 Debridement Performed for Wound #1 Left,Anterior Lower Leg Assessment: Performed By: Physician Lawanda Cousins, NP Debridement Type: Debridement Pre-procedure Verification/Time Yes - 11:38 Out Taken: Start Time: 11:38 Pain Control: Other : lidocaine 4% Total Area Debrided (L x W): 0.8 (cm) x 1.4 (cm) = 1.12 (cm) Tissue and other material Viable, Non-Viable, Subcutaneous debrided: Level: Skin/Subcutaneous Tissue Debridement Description: Excisional Instrument: Curette Bleeding: Minimum Hemostasis Achieved: Pressure End Time: 11:39 Procedural Pain: 0 Post Procedural Pain: 0 Response to Treatment: Procedure was tolerated well Level of Consciousness: Awake and Alert Post Debridement Measurements of Total Wound Length: (cm) 0.8 Width: (cm)  1.4 Depth: (cm) 0.2 Volume: (cm) 0.176 Character of Wound/Ulcer Post Debridement: Stable Post Procedure Diagnosis Same as Pre-procedure Electronic Signature(s) Signed: 01/24/2018 5:29:48 PM By: Lawanda Cousins Signed: 01/24/2018 6:04:06 PM By: Gretta Cool, BSN, RN, CWS, Kim RN, BSN Entered By: Gretta Cool, BSN, RN, CWS, Kim on 01/24/2018 11:39:57 Larocca, Sevin D. ([id number]) -------------------------------------------------------------------------------- HPI Details Patient Name: [name], [name] Date of Service: 01/24/2018 11:00 AM Medical Record Number: [id number] Patient Account Number: [id number] Date of Birth/Sex: 11/17/1934 (83 y.o. F) Treating RN: Ahmed Prima Primary Care Provider: Ceasar Lund Other Clinician: Referring Provider: Ceasar Lund Treating Provider/Extender: Cathie Olden in Treatment: 4 History of Present Illness HPI Description: 12/26/17 patient presents today with a past medical history positive for congestive heart failure, hypertension, bilateral lower extremity edema, venous insufficiency, and unfortunately for the past three weeks she has been dealing with the left anterior lower extremity dramatic wound after hitting her leg on the bottom of a free car door frame. This occurred roughly 3 weeks ago and patient states that she went to urgent care the same day where they performed in incision and drainage cleaning out quite a bit in the way of blood clots. However they did not recommend packing the wound and since that time she has developed a built-up for their blood clots in the region. Unfortunately this continues to give her problems she was given clindamycin although at this point it appeared that she may have an infection that is definitely your theme of noted currently. There does not appear to be any evidence of systemic infection which is good news. On evaluation today patient does have discomfort although it's many with touching over the  area and again it's again even mainly with specifically squeezing or attempting to get any of the blood clots out of the wound. There appears to be two small openings although that connects and there is one central opening with asymptomatic undermining underlying this. Nonetheless it appears this likely is going to need to be open board in order to see this fully and completely heal appropriately. No fevers, chills,  nausea, or vomiting noted at this time. 01/02/18 on evaluation today the patient's left lower extremity ulcer appears to be doing significantly better. She did have some Slough on the surface of the wound but this was able to easily be debrided away with saline gauze utilizing a sterile Q-tip. With that being said she did not have any significant bleeding like last time overall I feel like she has tolerated the dressing changes very well. In general I feel like she's making great progress. With that being said I still feel like that she has some healing to go nonetheless but again I'm definitely pleased with the interval change from last week to this week. 01/09/18 on evaluation today the patient appears to be doing well in regard to her lower extremity ulcer. She continues to make good progress with good granulation noted at the base of the wound which is excellent news. With that being said she does note that her biggest concern right now is that she's not able to take a shower she states this has been a very big hardship for her she wonders how much longer it will be before she's able to get in the shower. Otherwise she's not having any significant pain which is good news definitely no significant discharge of bleeding. 01/16/18 on evaluation today patient actually appears to be doing worse in regard to her left lower extremity. She does note that the wound itself seems to be showing signs of improvement with new granulation. She still has some undermining. Nonetheless she actually  appears to likely have some cellulitis noted which she states is something she tends to get somewhat frequently unfortunately. This is actually distal to where the wound is. There is a new small wound at the site as well. 01/24/18-She is seen in follow up evaluation for left lower extremity wound. Her primary ulcer is improving with granulation tissue throughout. She does present with new areas of partial thickness tissue loss after scab removed. She does have significant edema with intact blister to the left anterior leg. She is refusing any form of compression therapy. We will continue with same treatment plan and she'll follow-up next week Electronic Signature(s) Signed: 01/24/2018 12:49:27 PM By: Lawanda Cousins Entered By: Lawanda Cousins on 01/24/2018 12:49:26 Bearden, Deondria D. ([id number]) -------------------------------------------------------------------------------- Physician Orders Details Patient Name: Misty Drape D. Date of Service: 01/24/2018 11:00 AM Medical Record Number: [id number] Patient Account Number: [id number] Date of Birth/Sex: 1935/02/11 (83 y.o. F) Treating RN: Ahmed Prima Primary Care Provider: Ceasar Lund Other Clinician: Referring Provider: Ceasar Lund Treating Provider/Extender: Cathie Olden in Treatment: 4 Verbal / Phone Orders: No Diagnosis Coding ICD-10 Coding Code Description [phone] Non-pressure chronic ulcer of other part of left lower leg with fat layer exposed L02.416 Cutaneous abscess of left lower limb I87.2 Venous insufficiency (chronic) (peripheral) U27.25 Chronic systolic (congestive) heart failure I10 Essential (primary) hypertension L97.821 Non-pressure chronic ulcer of other part of left lower leg limited to breakdown of skin Wound Cleansing Wound #1 Left,Anterior Lower Leg o Clean wound with Normal Saline. o May shower with protection. Anesthetic (add to Medication List) Wound #1 Left,Anterior Lower  Leg o Topical Lidocaine 4% cream applied to wound bed prior to debridement (In Clinic Only). Primary Wound Dressing Wound #1 Left,Anterior Lower Leg o Silver Collagen - pack lightly into undermining, at the 6 oclock to the 1 oclock area of undermining Secondary Dressing Wound #1 Left,Anterior Lower Leg o Foam o Tegaderm - cover foam with tegaderm Dressing Change Frequency Wound #1  Left,Anterior Lower Leg o Change Dressing Monday, Wednesday, Friday Follow-up Appointments Wound #1 Left,Anterior Lower Leg o Return Appointment in 1 week. Home Health Wound #1 [place] for [place] Nurse may visit PRN to address patientos wound care needs. KALA, AMBRIZ D. ([id number]) o FACE TO FACE ENCOUNTER: MEDICARE and MEDICAID PATIENTS: I certify that this patient is under my care and that I had a face-to-face encounter that meets the physician face-to-face encounter requirements with this patient on this date. The encounter with the patient was in whole or in part for the following MEDICAL CONDITION: (primary reason for [place]) MEDICAL NECESSITY: I certify, that based on my findings, NURSING services are a medically necessary home health service. HOME BOUND STATUS: I certify that my clinical findings support that this patient is homebound (i.e., Due to illness or injury, pt requires aid of supportive devices such as crutches, cane, wheelchairs, walkers, the use of special transportation or the assistance of another person to leave their place of residence. There is a normal inability to leave the home and doing so requires considerable and taxing effort. Other absences are for medical reasons / religious services and are infrequent or of short duration when for other reasons). o If current dressing causes regression in wound condition, may D/C ordered dressing product/s and apply Normal Saline Moist Dressing daily  until next [place] / Other MD appointment. [place] of regression in wound condition at [phone]. o Please direct any NON-WOUND related issues/requests for orders to patient's Primary Care Physician Electronic Signature(s) Signed: 01/24/2018 12:52:15 PM By: Lawanda Cousins Entered By: Lawanda Cousins on 01/24/2018 12:52:14 Bunning, Davin D. ([id number]) -------------------------------------------------------------------------------- Problem List Details Patient Name: MIRIAM, LILES D. Date of Service: 01/24/2018 11:00 AM Medical Record Number: [id number] Patient Account Number: [id number] Date of Birth/Sex: 1935/05/17 (83 y.o. F) Treating RN: Ahmed Prima Primary Care Provider: Ceasar Lund Other Clinician: Referring Provider: Ceasar Lund Treating Provider/Extender: Cathie Olden in Treatment: 4 Active Problems ICD-10 Evaluated Encounter Code Description Active Date Today Diagnosis [phone] Non-pressure chronic ulcer of other part of left lower leg with 12/27/2017 No Yes fat layer exposed L02.416 Cutaneous abscess of left lower limb 12/27/2017 No Yes I87.2 Venous insufficiency (chronic) (peripheral) 12/27/2017 No Yes T24.58 Chronic systolic (congestive) heart failure 12/27/2017 No Yes I10 Essential (primary) hypertension 12/27/2017 No Yes L97.821 Non-pressure chronic ulcer of other part of left lower leg 01/24/2018 No Yes limited to breakdown of skin Inactive Problems Resolved Problems Electronic Signature(s) Signed: 01/24/2018 12:43:52 PM By: Lawanda Cousins Entered By: Lawanda Cousins on 01/24/2018 12:43:51 Goodie, Sacoya D. ([id number]) -------------------------------------------------------------------------------- Progress Note Details Patient Name: Misty Drape D. Date of Service: 01/24/2018 11:00 AM Medical Record Number: [id number] Patient Account Number: [id number] Date of Birth/Sex: 1934/12/13 (83 y.o. F) Treating RN:  Ahmed Prima Primary Care Provider: Ceasar Lund Other Clinician: Referring Provider: Ceasar Lund Treating Provider/Extender: Cathie Olden in Treatment: 4 Subjective Chief Complaint Information obtained from Patient LLE History of Present Illness (HPI) 12/26/17 patient presents today with a past medical history positive for congestive heart failure, hypertension, bilateral lower extremity edema, venous insufficiency, and unfortunately for the past three weeks she has been dealing with the left anterior lower extremity dramatic wound after hitting her leg on the bottom of a free car door frame. This occurred roughly 3 weeks ago and patient states that she went to urgent care the same day where they performed in incision and drainage cleaning out quite  a bit in the way of blood clots. However they did not recommend packing the wound and since that time she has developed a built-up for their blood clots in the region. Unfortunately this continues to give her problems she was given clindamycin although at this point it appeared that she may have an infection that is definitely your theme of noted currently. There does not appear to be any evidence of systemic infection which is good news. On evaluation today patient does have discomfort although it's many with touching over the area and again it's again even mainly with specifically squeezing or attempting to get any of the blood clots out of the wound. There appears to be two small openings although that connects and there is one central opening with asymptomatic undermining underlying this. Nonetheless it appears this likely is going to need to be open board in order to see this fully and completely heal appropriately. No fevers, chills, nausea, or vomiting noted at this time. 01/02/18 on evaluation today the patient's left lower extremity ulcer appears to be doing significantly better. She did have some Slough on  the surface of the wound but this was able to easily be debrided away with saline gauze utilizing a sterile Q-tip. With that being said she did not have any significant bleeding like last time overall I feel like she has tolerated the dressing changes very well. In general I feel like she's making great progress. With that being said I still feel like that she has some healing to go nonetheless but again I'm definitely pleased with the interval change from last week to this week. 01/09/18 on evaluation today the patient appears to be doing well in regard to her lower extremity ulcer. She continues to make good progress with good granulation noted at the base of the wound which is excellent news. With that being said she does note that her biggest concern right now is that she's not able to take a shower she states this has been a very big hardship for her she wonders how much longer it will be before she's able to get in the shower. Otherwise she's not having any significant pain which is good news definitely no significant discharge of bleeding. 01/16/18 on evaluation today patient actually appears to be doing worse in regard to her left lower extremity. She does note that the wound itself seems to be showing signs of improvement with new granulation. She still has some undermining. Nonetheless she actually appears to likely have some cellulitis noted which she states is something she tends to get somewhat frequently unfortunately. This is actually distal to where the wound is. There is a new small wound at the site as well. 01/24/18-She is seen in follow up evaluation for left lower extremity wound. Her primary ulcer is improving with granulation tissue throughout. She does present with new areas of partial thickness tissue loss after scab removed. She does have significant edema with intact blister to the left anterior leg. She is refusing any form of compression therapy. We will continue with same  treatment plan and she'll follow-up next week Mcweeney, Tabitha D. ([id number]) Objective Constitutional Vitals Time Taken: 11:00 AM, Height: 61 in, Weight: 179 lbs, BMI: 33.8, Temperature: 97.9 F, Pulse: 58 bpm, Respiratory Rate: 18 breaths/min, Blood Pressure: 140/80 mmHg. General Notes: BP taken manual. Integumentary (Hair, Skin) Wound #1 status is Open. Original cause of wound was Trauma. The wound is located on the Left,Anterior Lower Leg. The wound measures 0.8cm length  x 1.4cm width x 0.3cm depth; 0.88cm^2 area and 0.264cm^3 volume. There is Fat Layer (Subcutaneous Tissue) Exposed exposed. There is no tunneling or undermining noted. There is a large amount of serous drainage noted. The wound margin is distinct with the outline attached to the wound base. There is medium (34-66%) pink granulation within the wound bed. There is a medium (34-66%) amount of necrotic tissue within the wound bed including Adherent Slough. The periwound skin appearance exhibited: Scarring, Erythema. The surrounding wound skin color is noted with erythema which is circumferential. Periwound temperature was noted as No Abnormality. The periwound has tenderness on palpation. Wound #2 status is Healed - Epithelialized. Original cause of wound was Gradually Appeared. The wound is located on the Left,Lateral Lower Leg. The wound measures 0cm length x 0cm width x 0cm depth; 0cm^2 area and 0cm^3 volume. There is no tunneling or undermining noted. There is a none present amount of drainage noted. The wound margin is flat and intact. There is no granulation within the wound bed. There is no necrotic tissue within the wound bed. The periwound skin appearance had no abnormalities noted for color. Periwound temperature was noted as No Abnormality. The periwound has tenderness on palpation. Wound #3 status is Open. Original cause of wound was Gradually Appeared. The wound is located on the [facility] Lower Leg. The  wound measures 0.7cm length x 0.5cm width x 0.1cm depth; 0.275cm^2 area and 0.027cm^3 volume. Wound #3 status is Open. Original cause of wound was Gradually Appeared. The wound is located on the [facility] Lower Leg. The wound measures 0.7cm length x 0.5cm width x 0.1cm depth; 0.275cm^2 area and 0.027cm^3 volume. There is no tunneling or undermining noted. There is a large amount of serous drainage noted. The wound margin is flat and intact. There is large (67-100%) pink granulation within the wound bed. There is no necrotic tissue within the wound bed. The periwound skin appearance did not exhibit: Callus, Crepitus, Excoriation, Induration, Rash, Scarring, Dry/Scaly, Maceration, Atrophie Blanche, Cyanosis, Ecchymosis, Hemosiderin Staining, Mottled, Pallor, Rubor, Erythema. Periwound temperature was noted as No Abnormality. Assessment Active Problems ICD-10 Non-pressure chronic ulcer of other part of left lower leg with fat layer exposed Cutaneous abscess of left lower limb Venous insufficiency (chronic) (peripheral) Chronic systolic (congestive) heart failure Essential (primary) hypertension Non-pressure chronic ulcer of other part of left lower leg limited to breakdown of skin Smarr, Khilynn D. ([id number]) Procedures Wound #1 Pre-procedure diagnosis of Wound #1 is a Trauma, Other located on the Left,Anterior Lower Leg . There was a Excisional Skin/Subcutaneous Tissue Debridement with a total area of 1.12 sq cm performed by Lawanda Cousins, NP. With the following instrument(s): Curette to remove Viable and Non-Viable tissue/material. Material removed includes Subcutaneous Tissue after achieving pain control using Other (lidocaine 4%). No specimens were taken. A time out was conducted at 11:38, prior to the start of the procedure. A Minimum amount of bleeding was controlled with Pressure. The procedure was tolerated well with a pain level of 0 throughout and a pain level of 0  following the procedure. Patient s Level of Consciousness post procedure was recorded as Awake and Alert. Post Debridement Measurements: 0.8cm length x 1.4cm width x 0.2cm depth; 0.176cm^3 volume. Character of Wound/Ulcer Post Debridement is stable. Post procedure Diagnosis Wound #1: Same as Pre-Procedure Plan Wound Cleansing: Wound #1 Left,Anterior Lower Leg: Clean wound with Normal Saline. May shower with protection. Anesthetic (add to Medication List): Wound #1 Left,Anterior Lower Leg: Topical Lidocaine 4% cream applied to wound bed  prior to debridement (In Clinic Only). Primary Wound Dressing: Wound #1 Left,Anterior Lower Leg: Silver Collagen - pack lightly into undermining, at the 6 oclock to the 1 oclock area of undermining Secondary Dressing: Wound #1 Left,Anterior Lower Leg: Foam Tegaderm - cover foam with tegaderm Dressing Change Frequency: Wound #1 Left,Anterior Lower Leg: Change Dressing Monday, Wednesday, Friday Follow-up Appointments: Wound #1 Left,Anterior Lower Leg: Return Appointment in 1 week. Home Health: Wound #1 Left,Anterior Lower Leg: [place] for [place] Nurse may visit PRN to address patient s wound care needs. FACE TO FACE ENCOUNTER: MEDICARE and MEDICAID PATIENTS: I certify that this patient is under my care and that I had a face-to-face encounter that meets the physician face-to-face encounter requirements with this patient on this date. The encounter with the patient was in whole or in part for the following MEDICAL CONDITION: (primary reason for [place]) MEDICAL NECESSITY: I certify, that based on my findings, NURSING services are a medically necessary home health service. HOME BOUND STATUS: I certify that my clinical findings support that this patient is homebound (i.e., Due to illness or injury, pt requires aid of supportive devices such as crutches, cane, wheelchairs, walkers, the use of special transportation  or the assistance of another person to leave their place of residence. There is a normal inability to leave the home and doing so requires considerable and taxing effort. Other absences are for medical reasons / religious services and are infrequent or of short duration when for other reasons). If current dressing causes regression in wound condition, may D/C ordered dressing product/s and apply Normal Saline Moist Dressing daily until next [place] / Other MD appointment. [place] of regression in wound condition at [phone]. Please direct any NON-WOUND related issues/requests for orders to patient's Primary Care Physician COLISHA, REDLER ([id number]) Electronic Signature(s) Signed: 01/24/2018 12:53:57 PM By: Lawanda Cousins Entered By: Lawanda Cousins on 01/24/2018 12:53:57 Perfetti, Sherill D. ([id number]) -------------------------------------------------------------------------------- SuperBill Details Patient Name: Misty Drape D. Date of Service: 01/24/2018 Medical Record Number: [id number] Patient Account Number: [id number] Date of Birth/Sex: 08/26/1934 (83 y.o. F) Treating RN: Ahmed Prima Primary Care Provider: Ceasar Lund Other Clinician: Referring Provider: Ceasar Lund Treating Provider/Extender: Cathie Olden in Treatment: 4 Diagnosis Coding ICD-10 Codes Code Description [phone] Non-pressure chronic ulcer of other part of left lower leg with fat layer exposed L02.416 Cutaneous abscess of left lower limb I87.2 Venous insufficiency (chronic) (peripheral) S23.95 Chronic systolic (congestive) heart failure I10 Essential (primary) hypertension L97.821 Non-pressure chronic ulcer of other part of left lower leg limited to breakdown of skin Facility Procedures CPT4 Code Description: [id number] - DEB SUBQ TISSUE 20 SQ CM/< ICD-10 Diagnosis Description L97.822 Non-pressure chronic ulcer of other part of left lower leg  with Modifier: fat layer expos Quantity: 1 ed Physician Procedures CPT4 Code Description: [id number] - WC PHYS SUBQ TISS 20 SQ CM ICD-10 Diagnosis Description L97.822 Non-pressure chronic ulcer of other part of left lower leg with Modifier: fat layer expos Quantity: 1 ed Electronic Signature(s) Signed: 01/24/2018 12:55:00 PM By: Lawanda Cousins Entered By: Lawanda Cousins on 01/24/2018 12:54:59

## 2018-02-11 NOTE — Progress Notes (Signed)
ANGELIZE, RYCE ([id number]) Visit Report for 02/03/2018 Arrival Information Details Patient Name: Misty Berry, Misty Berry. Date of Service: 02/03/2018 2:00 PM Medical Record Number: [id number] Patient Account Number: [id number] Date of Birth/Sex: [birth year] (83 y.o. F) Treating RN: Ahmed Prima Primary Care Ayame Rena: Ceasar Lund Other Clinician: Referring Mehki Klumpp: Ceasar Lund Treating Chaniyah Jahr/Extender: Melburn Hake, HOYT Weeks in Treatment: 5 Visit Information History Since Last Visit All ordered tests and consults were completed: No Patient Arrived: Walker Added or deleted any medications: No Arrival Time: 14:09 Any new allergies or adverse reactions: No Accompanied By: self Had a fall or experienced change in No Transfer Assistance: EasyPivot Patient activities of daily living that may affect Lift risk of falls: Patient Identification Verified: Yes Signs or symptoms of abuse/neglect since last visito No Secondary Verification Process Yes Hospitalized since last visit: No Completed: Implantable device outside of the clinic excluding No Patient Requires Transmission-Based No cellular tissue based products placed in the center Precautions: since last visit: Patient Has Alerts: No Has Dressing in Place as Prescribed: Yes Pain Present Now: No Electronic Signature(s) Signed: 02/03/2018 3:54:45 PM By: Alric Quan Entered By: Alric Quan on 02/03/2018 14:10:38 Fay, Teneil D. ([id number]) -------------------------------------------------------------------------------- Clinic Level of Care Assessment Details Patient Name: Misty Drape D. Date of Service: 02/03/2018 2:00 PM Medical Record Number: [id number] Patient Account Number: [id number] Date of Birth/Sex: 08-24-1934 (83 y.o. F) Treating RN: Montey Hora Primary Care Shannon Kirkendall: Ceasar Lund Other Clinician: Referring Bari Leib: Ceasar Lund Treating Ree Alcalde/Extender: Melburn Hake, HOYT Weeks in Treatment: 5 Clinic Level of Care Assessment Items TOOL 4 Quantity Score []  - Use when only an EandM is performed on FOLLOW-UP visit 0 ASSESSMENTS - Nursing Assessment / Reassessment X - Reassessment of Co-morbidities (includes updates in patient status) 1 10 X- 1 5 Reassessment of Adherence to Treatment Plan ASSESSMENTS - Wound and Skin Assessment / Reassessment []  - Simple Wound Assessment / Reassessment - one wound 0 X- 2 5 Complex Wound Assessment / Reassessment - multiple wounds []  - 0 Dermatologic / Skin Assessment (not related to wound area) ASSESSMENTS - Focused Assessment X - Circumferential Edema Measurements - multi extremities 1 5 []  - 0 Nutritional Assessment / Counseling / Intervention X- 1 5 Lower Extremity Assessment (monofilament, tuning fork, pulses) []  - 0 Peripheral Arterial Disease Assessment (using hand held doppler) ASSESSMENTS - Ostomy and/or Continence Assessment and Care []  - Incontinence Assessment and Management 0 []  - 0 Ostomy Care Assessment and Management (repouching, etc.) PROCESS - Coordination of Care X - Simple Patient / Family Education for ongoing care 1 15 []  - 0 Complex (extensive) Patient / Family Education for ongoing care []  - 0 Staff obtains Programmer, systems, Records, Test Results / Process Orders []  - 0 Staff telephones HHA, Nursing Homes / Clarify orders / etc []  - 0 Routine Transfer to another Facility (non-emergent condition) []  - 0 Routine Hospital Admission (non-emergent condition) []  - 0 New Admissions / Biomedical engineer / Ordering NPWT, Apligraf, etc. []  - 0 Emergency Hospital Admission (emergent condition) X- 1 10 Simple Discharge Coordination Perrinton, Omayra D. ([id number]) []  - 0 Complex (extensive) Discharge Coordination PROCESS - Special Needs []  - Pediatric / Minor Patient Management 0 []  - 0 Isolation Patient Management []  - 0 Hearing / Language / Visual special needs []  - 0 Assessment of  Community assistance (transportation, D/C planning, etc.) []  - 0 Additional assistance / Altered mentation []  - 0 Support Surface(s) Assessment (bed, cushion, seat, etc.) INTERVENTIONS - Wound Cleansing / Measurement []  - Simple Wound Cleansing -  one wound 0 X- 2 5 Complex Wound Cleansing - multiple wounds X- 1 5 Wound Imaging (photographs - any number of wounds) []  - 0 Wound Tracing (instead of photographs) []  - 0 Simple Wound Measurement - one wound X- 2 5 Complex Wound Measurement - multiple wounds INTERVENTIONS - Wound Dressings X - Small Wound Dressing one or multiple wounds 1 10 []  - 0 Medium Wound Dressing one or multiple wounds []  - 0 Large Wound Dressing one or multiple wounds []  - 0 Application of Medications - topical []  - 0 Application of Medications - injection INTERVENTIONS - Miscellaneous []  - External ear exam 0 []  - 0 Specimen Collection (cultures, biopsies, blood, body fluids, etc.) []  - 0 Specimen(s) / Culture(s) sent or taken to Lab for analysis []  - 0 Patient Transfer (multiple staff / Civil Service fast streamer / Similar devices) []  - 0 Simple Staple / Suture removal (25 or less) []  - 0 Complex Staple / Suture removal (26 or more) []  - 0 Hypo / Hyperglycemic Management (close monitor of Blood Glucose) []  - 0 Ankle / Brachial Index (ABI) - do not check if billed separately X- 1 5 Vital Signs Sylve, Giannah D. ([id number]) Has the patient been seen at the hospital within the last three years: Yes Total Score: 100 Level Of Care: New/Established - Level 3 Electronic Signature(s) Signed: 02/03/2018 4:18:40 PM By: Montey Hora Entered By: Montey Hora on 02/03/2018 14:44:07 Encarnacion, Grizel D. ([id number]) -------------------------------------------------------------------------------- Lower Extremity Assessment Details Patient Name: MADDY, Misty D. Date of Service: 02/03/2018 2:00 PM Medical Record Number: [id number] Patient Account Number: [id number] Date of  Birth/Sex: December 29, 1934 (83 y.o. F) Treating RN: Ahmed Prima Primary Care Mandeep Kiser: Ceasar Lund Other Clinician: Referring Chavez Rosol: Ceasar Lund Treating Izzah Pasqua/Extender: Melburn Hake, HOYT Weeks in Treatment: 5 Edema Assessment Assessed: [Left: No] [Right: No] [Left: Edema] [Right: :] Calf Left: Right: Point of Measurement: 33 cm From Medial Instep 40.2 cm cm Ankle Left: Right: Point of Measurement: 10 cm From Medial Instep 22.7 cm cm Vascular Assessment Pulses: Dorsalis Pedis Palpable: [Left:Yes] Posterior Tibial Extremity colors, hair growth, and conditions: Extremity Color: [Left:Red] Temperature of Extremity: [Left:Warm] Capillary Refill: [Left:< 3 seconds] Toe Nail Assessment Left: Right: Thick: No Discolored: Yes Deformed: Yes Improper Length and Hygiene: No Electronic Signature(s) Signed: 02/03/2018 3:54:45 PM By: Alric Quan Entered By: Alric Quan on 02/03/2018 14:24:35 Callies, Triva D. ([id number]) -------------------------------------------------------------------------------- Multi Wound Chart Details Patient Name: Misty Drape D. Date of Service: 02/03/2018 2:00 PM Medical Record Number: [id number] Patient Account Number: [id number] Date of Birth/Sex: [birth year] (83 y.o. F) Treating RN: Montey Hora Primary Care Karanvir Balderston: Ceasar Lund Other Clinician: Referring Zayd Bonet: Ceasar Lund Treating Eleesha Purkey/Extender: STONE III, HOYT Weeks in Treatment: 5 Vital Signs Height(in): 61 Pulse(bpm): 62 Weight(lbs): 179 Blood Pressure(mmHg): 169/71 Body Mass Index(BMI): 34 Temperature(F): 97.6 Respiratory Rate 18 (breaths/min): Photos: [1:No Photos] [3:No Photos] [N/A:N/A] Wound Location: [1:Left Lower Leg - Anterior] [3:Left, Distal, Anterior Lower Leg N/A] Wounding Event: [1:Trauma] [3:Gradually Appeared] [N/A:N/A] Primary Etiology: [1:Trauma, Other] [3:Venous Leg Ulcer] [N/A:N/A] Comorbid History:  [1:Cataracts, Asthma, Arrhythmia, Congestive Heart Failure, Hypertension, Osteoarthritis] [3:N/A] [N/A:N/A] Date Acquired: [1:12/05/2017] [3:01/24/2018] [N/A:N/A] Weeks of Treatment: [1:5] [3:1] [N/A:N/A] Wound Status: [1:Open] [3:Healed - Epithelialized] [N/A:N/A] Measurements L x W x D [1:0.6x1x0.3] [3:0x0x0] [N/A:N/A] (cm) Area (cm) : [1:0.471] [3:0] [N/A:N/A] Volume (cm) : [1:0.141] [3:0] [N/A:N/A] % Reduction in Area: [1:42.40%] [3:100.00%] [N/A:N/A] % Reduction in Volume: [1:84.30%] [3:100.00%] [N/A:N/A] Classification: [1:Full Thickness Without Exposed Support Structures] [3:Partial Thickness] [N/A:N/A] Exudate Amount: [1:Large] [3:N/A] [N/A:N/A] Exudate Type: [1:Serous] [  3:N/A] [N/A:N/A] Exudate Color: [1:amber] [3:N/A] [N/A:N/A] Wound Margin: [1:Distinct, outline attached] [3:N/A] [N/A:N/A] Granulation Amount: [1:Small (1-33%)] [3:N/A] [N/A:N/A] Granulation Quality: [1:Pink] [3:N/A] [N/A:N/A] Necrotic Amount: [1:Large (67-100%)] [3:N/A] [N/A:N/A] Exposed Structures: [1:Fat Layer (Subcutaneous Tissue) Exposed: Yes Fascia: No Tendon: No Muscle: No Joint: No Bone: No] [3:N/A] [N/A:N/A] Epithelialization: [1:None] [3:N/A] [N/A:N/A] Periwound Skin Texture: [1:Scarring: Yes] [3:No Abnormalities Noted] [N/A:N/A] Periwound Skin Moisture: [1:No Abnormalities Noted] [3:No Abnormalities Noted] [N/A:N/A] Periwound Skin Color: [1:Erythema: Yes] [3:No Abnormalities Noted] [N/A:N/A] Temperature: No Abnormality N/A N/A Tenderness on Palpation: Yes No N/A Wound Preparation: Ulcer Cleansing: N/A N/A Rinsed/Irrigated with Saline Topical Anesthetic Applied: Other: LIDOCAINE 4% Treatment Notes Electronic Signature(s) Signed: 02/03/2018 4:18:40 PM By: Montey Hora Entered By: Montey Hora on 02/03/2018 14:41:52 Tredway, Azjah D. ([id number]) -------------------------------------------------------------------------------- Multi-Disciplinary Care Plan Details Patient Name: ERIANNA, JOLLY  D. Date of Service: 02/03/2018 2:00 PM Medical Record Number: [id number] Patient Account Number: [id number] Date of Birth/Sex: 1935-02-18 (83 y.o. F) Treating RN: Montey Hora Primary Care Matan Steen: Ceasar Lund Other Clinician: Referring Leatrice Parilla: Ceasar Lund Treating Josede Cicero/Extender: Melburn Hake, HOYT Weeks in Treatment: 5 Active Inactive ` Orientation to the Wound Care Program Nursing Diagnoses: Knowledge deficit related to the wound healing center program Goals: Patient/caregiver will verbalize understanding of the [place] Program Date Initiated: 12/26/2017 Target Resolution Date: 01/16/2018 Goal Status: Active Interventions: Provide education on orientation to the wound center Notes: ` Wound/Skin Impairment Nursing Diagnoses: Impaired tissue integrity Goals: Patient/caregiver will verbalize understanding of skin care regimen Date Initiated: 12/26/2017 Target Resolution Date: 01/16/2018 Goal Status: Active Ulcer/skin breakdown will have a volume reduction of 30% by week 4 Date Initiated: 12/26/2017 Target Resolution Date: 01/16/2018 Goal Status: Active Interventions: Assess patient/caregiver ability to obtain necessary supplies Assess patient/caregiver ability to perform ulcer/skin care regimen upon admission and as needed Assess ulceration(s) every visit Treatment Activities: Skin care regimen initiated : 12/26/2017 Notes: Electronic Signature(s) Signed: 02/03/2018 4:18:40 PM By: Towanda Octave, Keishawna D. ([id number]) Entered By: Montey Hora on 02/03/2018 14:41:45 Enke, Malaysha D. ([id number]) -------------------------------------------------------------------------------- Pain Assessment Details Patient Name: TOPAZ, RAGLIN D. Date of Service: 02/03/2018 2:00 PM Medical Record Number: [id number] Patient Account Number: [id number] Date of Birth/Sex: [birth year] (83 y.o. F) Treating RN: Ahmed Prima Primary Care Mauriana Dann:  Ceasar Lund Other Clinician: Referring Jomarie Gellis: Ceasar Lund Treating Jc Veron/Extender: Melburn Hake, HOYT Weeks in Treatment: 5 Active Problems Location of Pain Severity and Description of Pain Patient Has Paino No Site Locations Pain Management and Medication Current Pain Management: Electronic Signature(s) Signed: 02/03/2018 3:54:45 PM By: Alric Quan Entered By: Alric Quan on 02/03/2018 14:10:43 Gabbard, Gerlean D. ([id number]) -------------------------------------------------------------------------------- Wound Assessment Details Patient Name: Misty Drape D. Date of Service: 02/03/2018 2:00 PM Medical Record Number: [id number] Patient Account Number: [id number] Date of Birth/Sex: September 08, 1934 (83 y.o. F) Treating RN: Ahmed Prima Primary Care Kalana Yust: Ceasar Lund Other Clinician: Referring Meliss Fleek: Ceasar Lund Treating Joyclyn Plazola/Extender: Melburn Hake, HOYT Weeks in Treatment: 5 Wound Status Wound Number: 1 Primary Trauma, Other Etiology: Wound Location: Left Lower Leg - Anterior Wound Open Wounding Event: Trauma Status: Date Acquired: 12/05/2017 Comorbid Cataracts, Asthma, Arrhythmia, Congestive Weeks Of Treatment: 5 History: Heart Failure, Hypertension, Osteoarthritis Clustered Wound: No Photos Photo Uploaded By: Alric Quan on 02/03/2018 15:58:53 Wound Measurements Length: (cm) 0.6 Width: (cm) 1 Depth: (cm) 0.3 Area: (cm) 0.471 Volume: (cm) 0.141 % Reduction in Area: 42.4% % Reduction in Volume: 84.3% Epithelialization: None Tunneling: No Undermining: No Wound Description Full Thickness Without Exposed Support Classification: Structures Wound Margin: Distinct, outline attached Exudate Large Amount: Exudate Type:  Serous Exudate Color: amber Foul Odor After Cleansing: No Slough/Fibrino No Wound Bed Granulation Amount: Small (1-33%) Exposed Structure Granulation Quality: Pink Fascia  Exposed: No Necrotic Amount: Large (67-100%) Fat Layer (Subcutaneous Tissue) Exposed: Yes Necrotic Quality: Adherent Slough Tendon Exposed: No Muscle Exposed: No Joint Exposed: No Bone Exposed: No Dawson, Bitania D. ([id number]) Periwound Skin Texture Texture Color No Abnormalities Noted: No No Abnormalities Noted: No Scarring: Yes Erythema: Yes Moisture Temperature / Pain No Abnormalities Noted: No Temperature: No Abnormality Tenderness on Palpation: Yes Wound Preparation Ulcer Cleansing: Rinsed/Irrigated with Saline Topical Anesthetic Applied: Other: LIDOCAINE 4%, Electronic Signature(s) Signed: 02/03/2018 3:54:45 PM By: Alric Quan Entered By: Alric Quan on 02/03/2018 14:22:24 Wetherell, Delorse D. ([id number]) -------------------------------------------------------------------------------- Wound Assessment Details Patient Name: JANESHIA, CILIBERTO D. Date of Service: 02/03/2018 2:00 PM Medical Record Number: [id number] Patient Account Number: [id number] Date of Birth/Sex: 23-Feb-1935 (83 y.o. F) Treating RN: Ahmed Prima Primary Care Milana Salay: Ceasar Lund Other Clinician: Referring Samirah Scarpati: Ceasar Lund Treating Koven Belinsky/Extender: Melburn Hake, HOYT Weeks in Treatment: 5 Wound Status Wound Number: 3 Primary Etiology: Venous Leg Ulcer Wound Location: Left, Distal, Anterior Lower Leg Wound Status: Healed - Epithelialized Wounding Event: Gradually Appeared Date Acquired: 01/24/2018 Weeks Of Treatment: 1 Clustered Wound: No Wound Measurements Length: (cm) 0 Width: (cm) 0 Depth: (cm) 0 Area: (cm) 0 Volume: (cm) 0 % Reduction in Area: 100% % Reduction in Volume: 100% Wound Description Classification: Partial Thickness Periwound Skin Texture Texture Color No Abnormalities Noted: No No Abnormalities Noted: No Moisture No Abnormalities Noted: No Electronic Signature(s) Signed: 02/03/2018 3:54:45 PM By: Alric Quan Entered By:  Alric Quan on 02/03/2018 14:22:02 Petrosyan, Bryar D. ([id number]) -------------------------------------------------------------------------------- Vitals Details Patient Name: Misty Drape D. Date of Service: 02/03/2018 2:00 PM Medical Record Number: [id number] Patient Account Number: [id number] Date of Birth/Sex: Dec 28, 1934 (83 y.o. F) Treating RN: Ahmed Prima Primary Care Gardy Montanari: Ceasar Lund Other Clinician: Referring Zaelyn Noack: Ceasar Lund Treating Angel Hobdy/Extender: STONE III, HOYT Weeks in Treatment: 5 Vital Signs Time Taken: 14:10 Temperature (F): 97.6 Height (in): 61 Pulse (bpm): 62 Weight (lbs): 179 Respiratory Rate (breaths/min): 18 Body Mass Index (BMI): 33.8 Blood Pressure (mmHg): 169/71 Reference Range: 80 - 120 mg / dl Electronic Signature(s) Signed: 02/03/2018 3:54:45 PM By: Alric Quan Entered By: Alric Quan on 02/03/2018 14:15:45

## 2018-02-14 ENCOUNTER — Encounter: Payer: Medicare Other | Attending: Physician Assistant | Admitting: Physician Assistant

## 2018-02-14 DIAGNOSIS — L97822 Non-pressure chronic ulcer of other part of left lower leg with fat layer exposed: Secondary | ICD-10-CM | POA: Diagnosis present

## 2018-02-14 DIAGNOSIS — J45909 Unspecified asthma, uncomplicated: Secondary | ICD-10-CM | POA: Insufficient documentation

## 2018-02-14 DIAGNOSIS — M1990 Unspecified osteoarthritis, unspecified site: Secondary | ICD-10-CM | POA: Diagnosis not present

## 2018-02-14 DIAGNOSIS — I872 Venous insufficiency (chronic) (peripheral): Secondary | ICD-10-CM | POA: Insufficient documentation

## 2018-02-14 DIAGNOSIS — I110 Hypertensive heart disease with heart failure: Secondary | ICD-10-CM | POA: Diagnosis not present

## 2018-02-14 DIAGNOSIS — I5022 Chronic systolic (congestive) heart failure: Secondary | ICD-10-CM | POA: Insufficient documentation

## 2018-02-14 DIAGNOSIS — Z87891 Personal history of nicotine dependence: Secondary | ICD-10-CM | POA: Insufficient documentation

## 2018-02-14 DIAGNOSIS — L97921 Non-pressure chronic ulcer of unspecified part of left lower leg limited to breakdown of skin: Secondary | ICD-10-CM | POA: Diagnosis not present

## 2018-02-16 NOTE — Progress Notes (Addendum)
Misty, Berry ([id number]) Visit Report for 02/14/2018 Chief Complaint Document Details Patient Name: Misty Berry, Misty Berry. Date of Service: 02/14/2018 3:45 PM Medical Record Number: [id number] Patient Account Number: [id number] Date of Birth/Sex: September 29, 1934 (83 y.o. F) Treating RN: Montey Hora Primary Care Provider: Ceasar Lund Other Clinician: Referring Provider: Ceasar Lund Treating Provider/Extender: Melburn Hake, [name]: 7 Information Obtained from: Patient Chief Complaint LLE Electronic Signature(s) Signed: 02/15/2018 11:11:19 AM By: Worthy Keeler PA-C Entered By: Worthy Berry on 02/14/2018 16:36:25 Berry, Misty Berry. ([id number]) -------------------------------------------------------------------------------- HPI Details Patient Name: Misty Berry, Misty Berry. Date of Service: 02/14/2018 3:45 PM Medical Record Number: [id number] Patient Account Number: [id number] Date of Birth/Sex: [birth year] (83 y.o. F) Treating RN: Montey Hora Primary Care Provider: Ceasar Lund Other Clinician: Referring Provider: Ceasar Lund Treating Provider/Extender: Melburn Hake, [name] Weeks in Treatment: 7 History of Present Illness HPI Description: 12/26/17 patient presents today with a past medical history positive for congestive heart failure, hypertension, bilateral lower extremity edema, venous insufficiency, and unfortunately for the past three weeks she has been dealing with the left anterior lower extremity dramatic wound after hitting her leg on the bottom of a free car door frame. This occurred roughly 3 weeks ago and patient states that she went to urgent care the same day where they performed in incision and drainage cleaning out quite a bit in the way of blood clots. However they did not recommend packing the wound and since that time she has developed a built-up for their blood clots in the region. Unfortunately this continues to give  her problems she was given clindamycin although at this point it appeared that she may have an infection that is definitely your theme of noted currently. There does not appear to be any evidence of systemic infection which is good news. On evaluation today patient does have discomfort although it's many with touching over the area and again it's again even mainly with specifically squeezing or attempting to get any of the blood clots out of the wound. There appears to be two small openings although that connects and there is one central opening with asymptomatic undermining underlying this. Nonetheless it appears this likely is going to need to be open board in order to see this fully and completely heal appropriately. No fevers, chills, nausea, or vomiting noted at this time. 01/02/18 on evaluation today the patient's left lower extremity ulcer appears to be doing significantly better. She did have some Slough on the surface of the wound but this was able to easily be debrided away with saline gauze utilizing a sterile Q-tip. With that being said she did not have any significant bleeding like last time overall I feel like she has tolerated the dressing changes very well. In general I feel like she's making great progress. With that being said I still feel like that she has some healing to go nonetheless but again I'm definitely pleased with the interval change from last week to this week. 01/09/18 on evaluation today the patient appears to be doing well in regard to her lower extremity ulcer. She continues to make good progress with good granulation noted at the base of the wound which is excellent news. With that being said she does note that her biggest concern right now is that she's not able to take a shower she states this has been a very big hardship for her she wonders how much longer it will be before she's able to get in the shower. Otherwise she's  not having any significant pain which is  good news definitely no significant discharge of bleeding. 01/16/18 on evaluation today patient actually appears to be doing worse in regard to her left lower extremity. She does note that the wound itself seems to be showing signs of improvement with new granulation. She still has some undermining. Nonetheless she actually appears to likely have some cellulitis noted which she states is something she tends to get somewhat frequently unfortunately. This is actually distal to where the wound is. There is a new small wound at the site as well. 01/24/18-She is seen in follow up evaluation for left lower extremity wound. Her primary ulcer is improving with granulation tissue throughout. She does present with new areas of partial thickness tissue loss after scab removed. She does have significant edema with intact blister to the left anterior leg. She is refusing any form of compression therapy. We will continue with same treatment plan and she'll follow-up next week 02/03/18 on evaluation today patient appears to be showing signs of good improvement currently in regard to her wound on the left lateral lower extremity. In fact this is very close to completely close in which is great news there's no evidence of infection. 02/14/18 on evaluation today patient actually appears to be healed in regard to the wound that we have been treating up to this point. Unfortunately she is an area that she scratched distal to the wound that is very superficial but nonetheless have continued to cause a little bit of discomfort. There does not appear to be any evidence of infection which is good news. Electronic Signature(s) Signed: 02/15/2018 11:11:19 AM By: Ezequiel Kayser, Seana Berry. ([id number]) Entered By: Worthy Berry on 02/14/2018 17:51:26 Misty Berry, Misty Berry. ([id number]) -------------------------------------------------------------------------------- Physical Exam Details Patient Name: Misty, TOOHEY Berry. Date  of Service: 02/14/2018 3:45 PM Medical Record Number: [id number] Patient Account Number: [id number] Date of Birth/Sex: 05/21/1935 (83 y.o. F) Treating RN: Montey Hora Primary Care Provider: Ceasar Lund Other Clinician: Referring Provider: Ceasar Lund Treating Provider/Extender: STONE III, Cassie Shedlock Weeks in Treatment: 7 Constitutional Well-nourished and well-hydrated in no acute distress. Respiratory normal breathing without difficulty. clear to auscultation bilaterally. Cardiovascular regular rate and rhythm with normal S1, S2. Psychiatric this patient is able to make decisions and demonstrates good insight into disease process. Alert and Oriented x 3. pleasant and cooperative. Notes Patient's wound bed shows evidence of complete healing in regard to the wound that we have been treating. The new area that she scratched does have a superficial dermal opening but fortunately there does not appear to be any evidence of infection and overall I feel like this is likely [place] fairly rapidly. Electronic Signature(s) Signed: 02/15/2018 11:11:19 AM By: Worthy Keeler PA-C Entered By: Worthy Berry on 02/14/2018 17:55:39 Misty Berry, Misty Berry. ([id number]) -------------------------------------------------------------------------------- Physician Orders Details Patient Name: Misty Berry, Misty Berry. Date of Service: 02/14/2018 3:45 PM Medical Record Number: [id number] Patient Account Number: [id number] Date of Birth/Sex: 06/28/1934 (83 y.o. F) Treating RN: Montey Hora Primary Care Provider: Ceasar Lund Other Clinician: Referring Provider: Ceasar Lund Treating Provider/Extender: Melburn Hake, Monta Maiorana Weeks in Treatment: 7 Verbal / Phone Orders: No Diagnosis Coding ICD-10 Coding Code Description [phone] Non-pressure chronic ulcer of other part of left lower leg with fat layer exposed L02.416 Cutaneous abscess of left lower limb I87.2 Venous insufficiency  (chronic) (peripheral) O75.64 Chronic systolic (congestive) heart failure I10 Essential (primary) hypertension L97.821 Non-pressure chronic ulcer of other part of left lower leg limited to  breakdown of skin Wound Cleansing Wound #4 Distal,Anterior Lower Leg o May Shower, gently pat wound dry prior to applying new dressing. Primary Wound Dressing Wound #4 Distal,Anterior Lower Leg o Xeroform Secondary Dressing Wound #4 Distal,Anterior Lower Leg o Non-adherent pad - secure with netting Dressing Change Frequency Wound #4 Distal,Anterior Lower Leg o Change dressing every other day. Follow-up Appointments Wound #4 Distal,Anterior Lower Leg o Return Appointment in 2 weeks. Edema Control Wound #4 Distal,Anterior Lower Leg o Elevate legs to the level of the heart and pump ankles as often as possible Home Health Wound #4 [place] Nurse may visit PRN to address patientos wound care needs. o FACE TO FACE ENCOUNTER: MEDICARE and MEDICAID PATIENTS: I certify that this patient is under my care and that I had a face-to-face encounter that meets the physician face-to-face encounter requirements with this patient on this date. The encounter with the patient was in whole or in part for the following MEDICAL Misty Berry, Misty Berry. ([id number]) CONDITION: (primary reason for Home Healthcare) MEDICAL NECESSITY: I certify, that based on my findings, NURSING services are a medically necessary home health service. HOME BOUND STATUS: I certify that my clinical findings support that this patient is homebound (i.e., Due to illness or injury, pt requires aid of supportive devices such as crutches, cane, wheelchairs, walkers, the use of special transportation or the assistance of another person to leave their place of residence. There is a normal inability to leave the home and doing so requires considerable and taxing effort. Other  absences are for medical reasons / religious services and are infrequent or of short duration when for other reasons). o If current dressing causes regression in wound condition, may Berry/C ordered dressing product/s and apply Normal Saline Moist Dressing daily until next [place] / Other MD appointment. [place] of regression in wound condition at [phone]. o Please direct any NON-WOUND related issues/requests for orders to patient's Primary Care Physician Electronic Signature(s) Signed: 02/14/2018 5:51:08 PM By: Montey Hora Signed: 02/15/2018 11:11:19 AM By: Worthy Keeler PA-C Entered By: Montey Hora on 02/14/2018 16:45:23 Misty Berry, Misty Berry. ([id number]) -------------------------------------------------------------------------------- Problem List Details Patient Name: Misty Berry, Misty Berry. Date of Service: 02/14/2018 3:45 PM Medical Record Number: [id number] Patient Account Number: [id number] Date of Birth/Sex: 1934/12/19 (83 y.o. F) Treating RN: Montey Hora Primary Care Provider: Ceasar Lund Other Clinician: Referring Provider: Ceasar Lund Treating Provider/Extender: Melburn Hake, Fleet Higham Weeks in Treatment: 7 Active Problems ICD-10 Evaluated Encounter Code Description Active Date Today Diagnosis L97.822 Non-pressure chronic ulcer of other part of left lower leg with 12/27/2017 No Yes fat layer exposed L02.416 Cutaneous abscess of left lower limb 12/27/2017 No Yes I87.2 Venous insufficiency (chronic) (peripheral) 12/27/2017 No Yes K59.93 Chronic systolic (congestive) heart failure 12/27/2017 No Yes I10 Essential (primary) hypertension 12/27/2017 No Yes L97.821 Non-pressure chronic ulcer of other part of left lower leg 01/24/2018 No Yes limited to breakdown of skin Inactive Problems Resolved Problems Electronic Signature(s) Signed: 02/15/2018 11:11:19 AM By: Worthy Keeler PA-C Entered By: Worthy Berry on 02/14/2018  16:36:19 Misty Berry, Misty Berry. ([id number]) -------------------------------------------------------------------------------- Progress Note Details Patient Name: Misty Drape Berry. Date of Service: 02/14/2018 3:45 PM Medical Record Number: [id number] Patient Account Number: [id number] Date of Birth/Sex: 1935/04/01 (83 y.o. F) Treating RN: Montey Hora Primary Care Provider: Ceasar Lund Other Clinician: Referring Provider: Ceasar Lund Treating Provider/Extender: Melburn Hake, Krisi Azua Weeks in Treatment: 7 Subjective Chief Complaint Information obtained from Patient LLE  History of Present Illness (HPI) 12/26/17 patient presents today with a past medical history positive for congestive heart failure, hypertension, bilateral lower extremity edema, venous insufficiency, and unfortunately for the past three weeks she has been dealing with the left anterior lower extremity dramatic wound after hitting her leg on the bottom of a free car door frame. This occurred roughly 3 weeks ago and patient states that she went to urgent care the same day where they performed in incision and drainage cleaning out quite a bit in the way of blood clots. However they did not recommend packing the wound and since that time she has developed a built-up for their blood clots in the region. Unfortunately this continues to give her problems she was given clindamycin although at this point it appeared that she may have an infection that is definitely your theme of noted currently. There does not appear to be any evidence of systemic infection which is good news. On evaluation today patient does have discomfort although it's many with touching over the area and again it's again even mainly with specifically squeezing or attempting to get any of the blood clots out of the wound. There appears to be two small openings although that connects and there is one central opening with asymptomatic undermining underlying  this. Nonetheless it appears this likely is going to need to be open board in order to see this fully and completely heal appropriately. No fevers, chills, nausea, or vomiting noted at this time. 01/02/18 on evaluation today the patient's left lower extremity ulcer appears to be doing significantly better. She did have some Slough on the surface of the wound but this was able to easily be debrided away with saline gauze utilizing a sterile Q-tip. With that being said she did not have any significant bleeding like last time overall I feel like she has tolerated the dressing changes very well. In general I feel like she's making great progress. With that being said I still feel like that she has some healing to go nonetheless but again I'm definitely pleased with the interval change from last week to this week. 01/09/18 on evaluation today the patient appears to be doing well in regard to her lower extremity ulcer. She continues to make good progress with good granulation noted at the base of the wound which is excellent news. With that being said she does note that her biggest concern right now is that she's not able to take a shower she states this has been a very big hardship for her she wonders how much longer it will be before she's able to get in the shower. Otherwise she's not having any significant pain which is good news definitely no significant discharge of bleeding. 01/16/18 on evaluation today patient actually appears to be doing worse in regard to her left lower extremity. She does note that the wound itself seems to be showing signs of improvement with new granulation. She still has some undermining. Nonetheless she actually appears to likely have some cellulitis noted which she states is something she tends to get somewhat frequently unfortunately. This is actually distal to where the wound is. There is a new small wound at the site as well. 01/24/18-She is seen in follow up evaluation for  left lower extremity wound. Her primary ulcer is improving with granulation tissue throughout. She does present with new areas of partial thickness tissue loss after scab removed. She does have significant edema with intact blister to the left anterior leg.  She is refusing any form of compression therapy. We will continue with same treatment plan and she'll follow-up next week 02/03/18 on evaluation today patient appears to be showing signs of good improvement currently in regard to her wound on the left lateral lower extremity. In fact this is very close to completely close in which is great news there's no evidence of infection. 02/14/18 on evaluation today patient actually appears to be healed in regard to the wound that we have been treating up to this Misty Berry. ([id number]) point. Unfortunately she is an area that she scratched distal to the wound that is very superficial but nonetheless have continued to cause a little bit of discomfort. There does not appear to be any evidence of infection which is good news. Patient History Information obtained from Patient. Family History Heart Disease - Father, No family history of Cancer, Diabetes, Hereditary Spherocytosis, Hypertension, Kidney Disease, Lung Disease, Seizures, Stroke, Thyroid Problems, Tuberculosis. Social History Former smoker - quit 1972, Marital Status - Divorced, Alcohol Use - Never, Drug Use - No History, Caffeine Use - Never. Review of Systems (ROS) Constitutional Symptoms (General Health) Denies complaints or symptoms of Fever, Chills. Respiratory The patient has no complaints or symptoms. Cardiovascular The patient has no complaints or symptoms. Psychiatric The patient has no complaints or symptoms. Objective Constitutional Well-nourished and well-hydrated in no acute distress. Vitals Time Taken: 4:10 PM, Height: 61 in, Weight: 179 lbs, BMI: 33.8, Temperature: 97.6 F, Pulse: 63 bpm, Respiratory Rate: 16  breaths/min, Blood Pressure: 142/62 mmHg. Respiratory normal breathing without difficulty. clear to auscultation bilaterally. Cardiovascular regular rate and rhythm with normal S1, S2. Psychiatric this patient is able to make decisions and demonstrates good insight into disease process. Alert and Oriented x 3. pleasant and cooperative. General Notes: Patient's wound bed shows evidence of complete healing in regard to the wound that we have been treating. The new area that she scratched does have a superficial dermal opening but fortunately there does not appear to be any evidence of infection and overall I feel like this is likely [place] fairly rapidly. Integumentary (Hair, Skin) Wound #1 status is Healed - Epithelialized. Original cause of wound was Trauma. The wound is located on the Left,Anterior Lower Leg. The wound measures 0cm length x 0cm width x 0cm depth; 0cm^2 area and 0cm^3 volume. There is Fat Layer Blackson, Robert Berry. ([id number]) (Subcutaneous Tissue) Exposed exposed. There is no tunneling or undermining noted. There is a none present amount of drainage noted. The wound margin is distinct with the outline attached to the wound base. There is no granulation within the wound bed. There is no necrotic tissue within the wound bed. The periwound skin appearance exhibited: Scarring. The periwound skin appearance did not exhibit: Dry/Scaly, Maceration, Atrophie Blanche, Cyanosis, Ecchymosis, Hemosiderin Staining, Mottled, Pallor, Rubor, Erythema. Periwound temperature was noted as No Abnormality. The periwound has tenderness on palpation. Wound #4 status is Open. Original cause of wound was Gradually Appeared. The wound is located on the Left Distal,Anterior Lower Leg. The wound measures 0.6cm length x 0.9cm width x 0.1cm depth; 0.424cm^2 area and 0.042cm^3 volume. There is no tunneling or undermining noted. There is a small amount of serous drainage noted. There is small (1-33%)  granulation within the wound bed. There is a large (67-100%) amount of necrotic tissue within the wound bed including Adherent Slough. The periwound skin appearance did not exhibit: Callus, Crepitus, Excoriation, Induration, Rash, Scarring, Dry/Scaly, Maceration, Atrophie Blanche, Cyanosis, Ecchymosis, Hemosiderin Staining, Mottled, Pallor,  Rubor, Erythema. Assessment Active Problems ICD-10 Non-pressure chronic ulcer of other part of left lower leg with fat layer exposed Cutaneous abscess of left lower limb Venous insufficiency (chronic) (peripheral) Chronic systolic (congestive) heart failure Essential (primary) hypertension Non-pressure chronic ulcer of other part of left lower leg limited to breakdown of skin Plan Wound Cleansing: Wound #4 Left Distal,Anterior Lower Leg: May Shower, gently pat wound dry prior to applying new dressing. Primary Wound Dressing: Wound #4 Left Distal,Anterior Lower Leg: Xeroform Secondary Dressing: Wound #4 Left Distal,Anterior Lower Leg: Non-adherent pad - secure with netting Dressing Change Frequency: Wound #4 Left Distal,Anterior Lower Leg: Change dressing every other day. Follow-up Appointments: Wound #4 Left Distal,Anterior Lower Leg: Return Appointment in 2 weeks. Edema Control: Wound #4 Left Distal,Anterior Lower Leg: Elevate legs to the level of the heart and pump ankles as often as possible Home Health: Wound #4 Left Distal,Anterior Lower Leg: Continue Home Health Visits Home Health Nurse may visit PRN to address patient s wound care needs. Misty Berry, Misty Berry. ([id number]) FACE TO FACE ENCOUNTER: MEDICARE and MEDICAID PATIENTS: I certify that this patient is under my care and that I had a face-to-face encounter that meets the physician face-to-face encounter requirements with this patient on this date. The encounter with the patient was in whole or in part for the following MEDICAL CONDITION: (primary reason for [place]) MEDICAL  NECESSITY: I certify, that based on my findings, NURSING services are a medically necessary home health service. HOME BOUND STATUS: I certify that my clinical findings support that this patient is homebound (i.e., Due to illness or injury, pt requires aid of supportive devices such as crutches, cane, wheelchairs, walkers, the use of special transportation or the assistance of another person to leave their place of residence. There is a normal inability to leave the home and doing so requires considerable and taxing effort. Other absences are for medical reasons / religious services and are infrequent or of short duration when for other reasons). If current dressing causes regression in wound condition, may Berry/C ordered dressing product/s and apply Normal Saline Moist Dressing daily until next [place] / Other MD appointment. [place] of regression in wound condition at [phone]. Please direct any NON-WOUND related issues/requests for orders to patient's Primary Care Physician I recommend at this point that we actually initiate the above wound care orders for the new wound she's in agreement with plan. Will subsequently see were things stand at follow-up. Please see above for specific wound care orders. We will see patient for re-evaluation in 2 week(s) here in the clinic. If anything worsens or changes patient will contact our office for additional recommendations. Electronic Signature(s) Signed: 04/06/2018 3:24:52 AM By: Worthy Keeler PA-C Previous Signature: 02/15/2018 11:11:19 AM Version By: Worthy Keeler PA-C Entered By: Worthy Berry on 04/06/2018 02:37:38 Misty Berry, Berlyn Berry. ([id number]) -------------------------------------------------------------------------------- ROS/PFSH Details Patient Name: THATIANA, RENBARGER Berry. Date of Service: 02/14/2018 3:45 PM Medical Record Number: [id number] Patient Account Number: [id number] Date of Birth/Sex: 03/14/1935 (83 y.o.  F) Treating RN: Montey Hora Primary Care Provider: Ceasar Lund Other Clinician: Referring Provider: Ceasar Lund Treating Provider/Extender: Melburn Hake, Weda Baumgarner Weeks in Treatment: 7 Information Obtained From Patient Wound History Do you currently have one or more open woundso Yes How many open wounds do you currently haveo 1 Approximately how long have you had your woundso 3 weeks How have you been treating your wound(s) until nowo prescription cream (unknown) Has your wound(s) ever healed and then  re-openedo No Have you had any lab work done in the past montho No Have you tested positive for an antibiotic resistant organism (MRSA, VRE)o No Have you tested positive for osteomyelitis (bone infection)o No Have you had any tests for circulation on your legso No Have you had other problems associated with your woundso Swelling Constitutional Symptoms (General Health) Complaints and Symptoms: Negative for: Fever; Chills Eyes Medical History: Positive for: Cataracts - surgery Respiratory Complaints and Symptoms: No Complaints or Symptoms Medical History: Positive for: Asthma Cardiovascular Complaints and Symptoms: No Complaints or Symptoms Medical History: Positive for: Arrhythmia - hx atrial tachycardia; Congestive Heart Failure; Hypertension Endocrine Medical History: Negative for: Type I Diabetes; Type II Diabetes Musculoskeletal Medical History: Positive for: Osteoarthritis TONIQUA, MELAMED ([id number]) Psychiatric Complaints and Symptoms: No Complaints or Symptoms HBO Extended History Items Eyes: Cataracts Immunizations Pneumococcal Vaccine: Received Pneumococcal Vaccination: Yes Implantable Devices Family and Social History Cancer: No; Diabetes: No; Heart Disease: Yes - Father; Hereditary Spherocytosis: No; Hypertension: No; Kidney Disease: No; Lung Disease: No; Seizures: No; Stroke: No; Thyroid Problems: No; Tuberculosis: No; Former smoker  - quit 1972; Marital Status - Divorced; Alcohol Use: Never; Drug Use: No History; Caffeine Use: Never; Financial Concerns: No; Food, Clothing or Shelter Needs: No; Support System Lacking: No; Transportation Concerns: No; Advanced Directives: Yes (Not Provided); Patient does not want information on Advanced Directives; Do not resuscitate: No; Living Will: Yes (Not Provided); Medical Power of Attorney: Yes - [name] (brother) (Not Provided) Physician Affirmation I have reviewed and agree with the above information. Electronic Signature(s) Signed: 02/15/2018 11:11:19 AM By: Worthy Keeler PA-C Signed: 02/15/2018 4:57:10 PM By: Montey Hora Entered By: Worthy Berry on 02/14/2018 17:55:24 Bingman, Maryhelen Berry. ([id number]) -------------------------------------------------------------------------------- SuperBill Details Patient Name: YURITZI, KAMP Berry. Date of Service: 02/14/2018 Medical Record Number: [id number] Patient Account Number: [id number] Date of Birth/Sex: 05/16/1935 (83 y.o. F) Treating RN: Montey Hora Primary Care Provider: Ceasar Lund Other Clinician: Referring Provider: Ceasar Lund Treating Provider/Extender: Melburn Hake, Anyiah Coverdale Weeks in Treatment: 7 Diagnosis Coding ICD-10 Codes Code Description [phone] Non-pressure chronic ulcer of other part of left lower leg with fat layer exposed L02.416 Cutaneous abscess of left lower limb I87.2 Venous insufficiency (chronic) (peripheral) L57.26 Chronic systolic (congestive) heart failure I10 Essential (primary) hypertension L97.821 Non-pressure chronic ulcer of other part of left lower leg limited to breakdown of skin Facility Procedures CPT4 Code: [id number] Description: 99213 - WOUND CARE VISIT-LEV 3 EST PT Modifier: Quantity: 1 Physician Procedures CPT4 Code Description: [id number] 64680 - WC PHYS LEVEL 3 - EST PT ICD-10 Diagnosis Description L97.822 Non-pressure chronic ulcer of other part of left lower leg wit L02.416  Cutaneous abscess of left lower limb I87.2 Venous insufficiency (chronic)  (peripheral) H21.22 Chronic systolic (congestive) heart failure Modifier: h fat layer expos Quantity: 1 ed Electronic Signature(s) Signed: 02/15/2018 11:11:19 AM By: Worthy Keeler PA-C Entered By: Worthy Berry on 02/14/2018 17:56:19

## 2018-02-16 NOTE — Progress Notes (Signed)
Misty, Berry ([id number]) Visit Report for 02/14/2018 Arrival Information Details Patient Name: Misty Berry, Misty Berry. Date of Service: 02/14/2018 3:45 PM Medical Record Number: [id number] Patient Account Number: [id number] Date of Birth/Sex: April 30, 1935 (83 y.o. F) Treating RN: Secundino Ginger Primary Care Ayslin Kundert: Ceasar Lund Other Clinician: Referring Alanny Rivers: Ceasar Lund Treating Reida Hem/Extender: Melburn Hake, HOYT Weeks in Treatment: 7 Visit Information History Since Last Visit Added or deleted any medications: No Patient Arrived: Walker Any new allergies or adverse reactions: No Arrival Time: 16:06 Had a fall or experienced change in No Accompanied By: friend activities of daily living that may affect Transfer Assistance: None risk of falls: Patient Requires Transmission-Based Precautions: No Signs or symptoms of abuse/neglect since last visito No Patient Has Alerts: No Hospitalized since last visit: No Implantable device outside of the clinic excluding No cellular tissue based products placed in the center since last visit: Has Dressing in Place as Prescribed: Yes Pain Present Now: No Electronic Signature(s) Signed: 02/15/2018 9:53:02 AM By: Secundino Ginger Entered By: Secundino Ginger on 02/14/2018 16:09:54 Brand, Macklyn D. ([id number]) -------------------------------------------------------------------------------- Clinic Level of Care Assessment Details Patient Name: Misty Drape D. Date of Service: 02/14/2018 3:45 PM Medical Record Number: [id number] Patient Account Number: [id number] Date of Birth/Sex: 1935-01-23 (83 y.o. F) Treating RN: Montey Hora Primary Care Saidee Geremia: Ceasar Lund Other Clinician: Referring Jessy Cybulski: Ceasar Lund Treating Saydi Kobel/Extender: Melburn Hake, HOYT Weeks in Treatment: 7 Clinic Level of Care Assessment Items TOOL 4 Quantity Score []  - Use when only an EandM is performed on FOLLOW-UP visit 0 ASSESSMENTS - Nursing  Assessment / Reassessment X - Reassessment of Co-morbidities (includes updates in patient status) 1 10 X- 1 5 Reassessment of Adherence to Treatment Plan ASSESSMENTS - Wound and Skin Assessment / Reassessment []  - Simple Wound Assessment / Reassessment - one wound 0 X- 2 5 Complex Wound Assessment / Reassessment - multiple wounds []  - 0 Dermatologic / Skin Assessment (not related to wound area) ASSESSMENTS - Focused Assessment X - Circumferential Edema Measurements - multi extremities 1 5 []  - 0 Nutritional Assessment / Counseling / Intervention X- 1 5 Lower Extremity Assessment (monofilament, tuning fork, pulses) []  - 0 Peripheral Arterial Disease Assessment (using hand held doppler) ASSESSMENTS - Ostomy and/or Continence Assessment and Care []  - Incontinence Assessment and Management 0 []  - 0 Ostomy Care Assessment and Management (repouching, etc.) PROCESS - Coordination of Care X - Simple Patient / Family Education for ongoing care 1 15 []  - 0 Complex (extensive) Patient / Family Education for ongoing care []  - 0 Staff obtains Programmer, systems, Records, Test Results / Process Orders []  - 0 Staff telephones HHA, Nursing Homes / Clarify orders / etc []  - 0 Routine Transfer to another Facility (non-emergent condition) []  - 0 Routine Hospital Admission (non-emergent condition) []  - 0 New Admissions / Biomedical engineer / Ordering NPWT, Apligraf, etc. []  - 0 Emergency Hospital Admission (emergent condition) X- 1 10 Simple Discharge Coordination Medel, Rawan D. ([id number]) []  - 0 Complex (extensive) Discharge Coordination PROCESS - Special Needs []  - Pediatric / Minor Patient Management 0 []  - 0 Isolation Patient Management []  - 0 Hearing / Language / Visual special needs []  - 0 Assessment of Community assistance (transportation, D/C planning, etc.) []  - 0 Additional assistance / Altered mentation []  - 0 Support Surface(s) Assessment (bed, cushion, seat,  etc.) INTERVENTIONS - Wound Cleansing / Measurement []  - Simple Wound Cleansing - one wound 0 X- 2 5 Complex Wound Cleansing - multiple wounds X- 1 5 Wound Imaging (photographs -  any number of wounds) []  - 0 Wound Tracing (instead of photographs) []  - 0 Simple Wound Measurement - one wound X- 2 5 Complex Wound Measurement - multiple wounds INTERVENTIONS - Wound Dressings X - Small Wound Dressing one or multiple wounds 1 10 []  - 0 Medium Wound Dressing one or multiple wounds []  - 0 Large Wound Dressing one or multiple wounds []  - 0 Application of Medications - topical []  - 0 Application of Medications - injection INTERVENTIONS - Miscellaneous []  - External ear exam 0 []  - 0 Specimen Collection (cultures, biopsies, blood, body fluids, etc.) []  - 0 Specimen(s) / Culture(s) sent or taken to Lab for analysis []  - 0 Patient Transfer (multiple staff / Civil Service fast streamer / Similar devices) []  - 0 Simple Staple / Suture removal (25 or less) []  - 0 Complex Staple / Suture removal (26 or more) []  - 0 Hypo / Hyperglycemic Management (close monitor of Blood Glucose) []  - 0 Ankle / Brachial Index (ABI) - do not check if billed separately X- 1 5 Vital Signs Schnabel, Nolan D. ([id number]) Has the patient been seen at the hospital within the last three years: Yes Total Score: 100 Level Of Care: New/Established - Level 3 Electronic Signature(s) Signed: 02/14/2018 5:51:08 PM By: Montey Hora Entered By: Montey Hora on 02/14/2018 16:46:02 Bergerson, Jazaria D. ([id number]) -------------------------------------------------------------------------------- Encounter Discharge Information Details Patient Name: Misty, [name] Date of Service: 02/14/2018 3:45 PM Medical Record Number: [id number] Patient Account Number: [id number] Date of Birth/Sex: 03-29-1935 (83 y.o. F) Treating RN: Cornell Barman Primary Care Clydean Posas: Ceasar Lund Other Clinician: Referring Daisee Centner: Ceasar Lund Treating Chanise Habeck/Extender: Melburn Hake, HOYT Weeks in Treatment: 7 Encounter Discharge Information Items Discharge Condition: Stable Ambulatory Status: Walker Discharge Destination: Home Transportation: Private Auto Schedule Follow-up Appointment: Yes Clinical Summary of Care: Electronic Signature(s) Signed: 02/14/2018 6:56:50 PM By: Gretta Cool, BSN, RN, CWS, Kim RN, BSN Entered By: Gretta Cool, BSN, RN, CWS, Kim on 02/14/2018 16:53:54 Monier, Aikam DMarland Kitchen ([id number]) -------------------------------------------------------------------------------- Lower Extremity Assessment Details Patient Name: IPEK, WESTRA D. Date of Service: 02/14/2018 3:45 PM Medical Record Number: [id number] Patient Account Number: [id number] Date of Birth/Sex: 11/28/1934 (82 y.o. F) Treating RN: Secundino Ginger Primary Care Matheu Ploeger: Ceasar Lund Other Clinician: Referring Oniel Meleski: Ceasar Lund Treating Kennisha Qin/Extender: Melburn Hake, HOYT Weeks in Treatment: 7 Edema Assessment Assessed: [Left: No] [Right: No] Edema: [Left: Ye] [Right: s] Calf Left: Right: Point of Measurement: 33 cm From Medial Instep 39 cm cm Ankle Left: Right: Point of Measurement: 10 cm From Medial Instep 23 cm cm Vascular Assessment Claudication: Claudication Assessment [Left:None] Pulses: Dorsalis Pedis Palpable: [Left:Yes] Posterior Tibial Extremity colors, hair growth, and conditions: Extremity Color: [Left:Red] Hair Growth on Extremity: [Left:No] Temperature of Extremity: [Left:Cool] Capillary Refill: [Left:< 3 seconds] Toe Nail Assessment Left: Right: Thick: Yes Discolored: Yes Deformed: Yes Improper Length and Hygiene: No Electronic Signature(s) Signed: 02/15/2018 9:53:02 AM By: Secundino Ginger Entered By: Secundino Ginger on 02/14/2018 16:25:39 Nand, Luvia D. ([id number]) -------------------------------------------------------------------------------- Multi Wound Chart Details Patient Name: Misty Drape D. Date of  Service: 02/14/2018 3:45 PM Medical Record Number: [id number] Patient Account Number: [id number] Date of Birth/Sex: October 08, 1934 (83 y.o. F) Treating RN: Montey Hora Primary Care Jotham Ahn: Ceasar Lund Other Clinician: Referring Howie Rufus: Ceasar Lund Treating Kasidi Shanker/Extender: STONE III, HOYT Weeks in Treatment: 7 Vital Signs Height(in): 61 Pulse(bpm): 63 Weight(lbs): 179 Blood Pressure(mmHg): 142/62 Body Mass Index(BMI): 34 Temperature(F): 97.6 Respiratory Rate 16 (breaths/min): Photos: [N/A:N/A] Wound Location: Left Lower Leg - Anterior Lower Leg - Anterior, Distal N/A Wounding Event: Trauma Gradually  Appeared N/A Primary Etiology: Trauma, Other Lymphedema N/A Comorbid History: Cataracts, Asthma, Cataracts, Asthma, N/A Arrhythmia, Congestive Heart Arrhythmia, Congestive Heart Failure, Hypertension, Failure, Hypertension, Osteoarthritis Osteoarthritis Date Acquired: 12/05/2017 02/13/2018 N/A Weeks of Treatment: 7 0 N/A Wound Status: Open Open N/A Measurements L x W x D 0.1x0.1x0.1 0.6x0.9x0.1 N/A (cm) Area (cm) : 0.008 0.424 N/A Volume (cm) : 0.001 0.042 N/A % Reduction in Area: 99.00% 0.00% N/A % Reduction in Volume: 99.90% 0.00% N/A Classification: Full Thickness Without Partial Thickness N/A Exposed Support Structures Exudate Amount: None Present Small N/A Exudate Type: N/A Serous N/A Exudate Color: N/A amber N/A Wound Margin: Distinct, outline attached N/A N/A Granulation Amount: None Present (0%) Small (1-33%) N/A Necrotic Amount: None Present (0%) Large (67-100%) N/A Exposed Structures: Fat Layer (Subcutaneous Fascia: No N/A Tissue) Exposed: Yes Fat Layer (Subcutaneous Fascia: No Tissue) Exposed: No Tendon: No Tendon: No Muscle: No Muscle: No Andreasen, Taleen D. ([id number]) Joint: No Joint: No Bone: No Bone: No Epithelialization: None N/A N/A Periwound Skin Texture: Scarring: Yes Excoriation: No N/A Induration: No Callus:  No Crepitus: No Rash: No Scarring: No Periwound Skin Moisture: Maceration: No Maceration: No N/A Dry/Scaly: No Dry/Scaly: No Periwound Skin Color: Atrophie Blanche: No Atrophie Blanche: No N/A Cyanosis: No Cyanosis: No Ecchymosis: No Ecchymosis: No Erythema: No Erythema: No Hemosiderin Staining: No Hemosiderin Staining: No Mottled: No Mottled: No Pallor: No Pallor: No Rubor: No Rubor: No Temperature: No Abnormality N/A N/A Tenderness on Palpation: Yes No N/A Wound Preparation: Ulcer Cleansing: Ulcer Cleansing: N/A Rinsed/Irrigated with Saline Rinsed/Irrigated with Saline Topical Anesthetic Applied: Topical Anesthetic Applied: Other: LIDOCAINE 4% Other: lidocaine 4% Treatment Notes Electronic Signature(s) Signed: 02/14/2018 5:51:08 PM By: Montey Hora Entered By: Montey Hora on 02/14/2018 16:39:42 Bussa, Zyonna D. ([id number]) -------------------------------------------------------------------------------- Multi-Disciplinary Care Plan Details Patient Name: EMYAH, ROZNOWSKI D. Date of Service: 02/14/2018 3:45 PM Medical Record Number: [id number] Patient Account Number: [id number] Date of Birth/Sex: [birth year] (83 y.o. F) Treating RN: Montey Hora Primary Care Adana Marik: Ceasar Lund Other Clinician: Referring Asyia Hornung: Ceasar Lund Treating Donovin Kraemer/Extender: Melburn Hake, HOYT Weeks in Treatment: 7 Active Inactive ` Orientation to the Wound Care Program Nursing Diagnoses: Knowledge deficit related to the wound healing center program Goals: Patient/caregiver will verbalize understanding of the [facility] Date Initiated: 12/26/2017 Target Resolution Date: 01/16/2018 Goal Status: Active Interventions: Provide education on orientation to the wound center Notes: ` Wound/Skin Impairment Nursing Diagnoses: Impaired tissue integrity Goals: Patient/caregiver will verbalize understanding of skin care regimen Date Initiated:  12/26/2017 Target Resolution Date: 01/16/2018 Goal Status: Active Ulcer/skin breakdown will have a volume reduction of 30% by week 4 Date Initiated: 12/26/2017 Target Resolution Date: 01/16/2018 Goal Status: Active Interventions: Assess patient/caregiver ability to obtain necessary supplies Assess patient/caregiver ability to perform ulcer/skin care regimen upon admission and as needed Assess ulceration(s) every visit Treatment Activities: Skin care regimen initiated : 12/26/2017 Notes: Electronic Signature(s) Signed: 02/14/2018 5:51:08 PM By: Towanda Octave, Samanatha D. ([id number]) Entered By: Montey Hora on 02/14/2018 16:39:33 Kandel, Remedy D. ([id number]) -------------------------------------------------------------------------------- Pain Assessment Details Patient Name: AMBRA, HAVERSTICK D. Date of Service: 02/14/2018 3:45 PM Medical Record Number: [id number] Patient Account Number: [id number] Date of Birth/Sex: 1935-01-30 (83 y.o. F) Treating RN: Secundino Ginger Primary Care Alicya Bena: Ceasar Lund Other Clinician: Referring Pati Thinnes: Ceasar Lund Treating Riannah Stagner/Extender: Melburn Hake, HOYT Weeks in Treatment: 7 Active Problems Location of Pain Severity and Description of Pain Patient Has Paino No Site Locations Pain Management and Medication Current Pain Management: Goals for Pain Management pt denies any pain at  this time. Electronic Signature(s) Signed: 02/15/2018 9:53:02 AM By: Secundino Ginger Entered By: Secundino Ginger on 02/14/2018 16:10:13 Brightwell, Shamya D. ([id number]) -------------------------------------------------------------------------------- Patient/Caregiver Education Details Patient Name: ANDRIAN, URBACH D. Date of Service: 02/14/2018 3:45 PM Medical Record Number: [id number] Patient Account Number: [id number] Date of Birth/Gender: 11-21-1934 (83 y.o. F) Treating RN: Cornell Barman Primary Care Physician: Ceasar Lund Other Clinician: Referring  Physician: Ceasar Lund Treating Physician/Extender: Sharalyn Ink in Treatment: 7 Education Assessment Education Provided To: Patient Education Topics Provided Wound/Skin Impairment: Handouts: Caring for Your Ulcer Methods: Demonstration Responses: State content correctly Electronic Signature(s) Signed: 02/14/2018 6:56:50 PM By: Gretta Cool, BSN, RN, CWS, Kim RN, BSN Entered By: Gretta Cool, BSN, RN, CWS, Kim on 02/14/2018 16:54:07 Umar, Tailor DMarland Kitchen ([id number]) -------------------------------------------------------------------------------- Wound Assessment Details Patient Name: MAIRE, GOVAN D. Date of Service: 02/14/2018 3:45 PM Medical Record Number: [id number] Patient Account Number: [id number] Date of Birth/Sex: 03-04-1935 (83 y.o. F) Treating RN: Montey Hora Primary Care Lamya Lausch: Ceasar Lund Other Clinician: Referring Rasool Rommel: Ceasar Lund Treating Ruxin Ransome/Extender: Melburn Hake, HOYT Weeks in Treatment: 7 Wound Status Wound Number: 1 Primary Trauma, Other Etiology: Wound Location: Left, Anterior Lower Leg Wound Healed - Epithelialized Wounding Event: Trauma Status: Date Acquired: 12/05/2017 Comorbid Cataracts, Asthma, Arrhythmia, Congestive Weeks Of Treatment: 7 History: Heart Failure, Hypertension, Osteoarthritis Clustered Wound: No Photos Photo Uploaded By: Secundino Ginger on 02/14/2018 16:30:15 Wound Measurements Length: (cm) 0 % Reduc Width: (cm) 0 % Reduc Depth: (cm) 0 Epithel Area: (cm) 0 Tunnel Volume: (cm) 0 Underm tion in Area: 100% tion in Volume: 100% ialization: None ing: No ining: No Wound Description Full Thickness Without Exposed Support Foul Od Classification: Structures Slough/ Wound Margin: Distinct, outline attached Exudate None Present Amount: or After Cleansing: No Fibrino No Wound Bed Granulation Amount: None Present (0%) Exposed Structure Necrotic Amount: None Present (0%) Fascia Exposed: No Fat  Layer (Subcutaneous Tissue) Exposed: Yes Tendon Exposed: No Muscle Exposed: No Joint Exposed: No Bone Exposed: No Periwound Skin Texture Texture Color Ogletree, Geanine D. ([id number]) No Abnormalities Noted: No No Abnormalities Noted: No Scarring: Yes Atrophie Blanche: No Cyanosis: No Moisture Ecchymosis: No No Abnormalities Noted: No Erythema: No Dry / Scaly: No Hemosiderin Staining: No Maceration: No Mottled: No Pallor: No Rubor: No Temperature / Pain Temperature: No Abnormality Tenderness on Palpation: Yes Wound Preparation Ulcer Cleansing: Rinsed/Irrigated with Saline Topical Anesthetic Applied: Other: LIDOCAINE 4%, Electronic Signature(s) Signed: 02/14/2018 5:51:08 PM By: Montey Hora Entered By: Montey Hora on 02/14/2018 16:40:22 Neels, Teri D. ([id number]) -------------------------------------------------------------------------------- Wound Assessment Details Patient Name: KELIAH, HARNED D. Date of Service: 02/14/2018 3:45 PM Medical Record Number: [id number] Patient Account Number: [id number] Date of Birth/Sex: [birth year] (83 y.o. F) Treating RN: Secundino Ginger Primary Care Sadhana Frater: Ceasar Lund Other Clinician: Referring Kolter Reaver: Ceasar Lund Treating Amana Bouska/Extender: Melburn Hake, HOYT Weeks in Treatment: 7 Wound Status Wound Number: 4 Primary Lymphedema Etiology: Wound Location: Lower Leg - Anterior, Distal Wound Open Wounding Event: Gradually Appeared Status: Date Acquired: 02/13/2018 Comorbid Cataracts, Asthma, Arrhythmia, Congestive Weeks Of Treatment: 0 History: Heart Failure, Hypertension, Osteoarthritis Clustered Wound: No Photos Photo Uploaded By: Secundino Ginger on 02/14/2018 16:31:03 Wound Measurements Length: (cm) 0.6 % Reduction Width: (cm) 0.9 % Reduction Depth: (cm) 0.1 Tunneling: Area: (cm) 0.424 Underminin Volume: (cm) 0.042 in Area: 0% in Volume: 0% No g: No Wound Description Classification: Partial  Thickness Exudate Amount: Small Exudate Type: Serous Exudate Color: amber Wound Bed Granulation Amount: Small (1-33%) Exposed Structure Necrotic Amount: Large (67-100%) Fascia Exposed: No Necrotic Quality: Adherent Slough Fat Layer (Subcutaneous Tissue)  Exposed: No Tendon Exposed: No Muscle Exposed: No Joint Exposed: No Bone Exposed: No Periwound Skin Texture Texture Color Madril, Mariene D. ([id number]) No Abnormalities Noted: No No Abnormalities Noted: No Callus: No Atrophie Blanche: No Crepitus: No Cyanosis: No Excoriation: No Ecchymosis: No Induration: No Erythema: No Rash: No Hemosiderin Staining: No Scarring: No Mottled: No Pallor: No Moisture Rubor: No No Abnormalities Noted: No Dry / Scaly: No Maceration: No Wound Preparation Ulcer Cleansing: Rinsed/Irrigated with Saline Topical Anesthetic Applied: Other: lidocaine 4%, Treatment Notes Wound #4 (Distal, Anterior Lower Leg) 4. Dressing Applied: Xeroform 5. Secondary [place] Notes stretch net Electronic Signature(s) Signed: 02/15/2018 9:53:02 AM By: Secundino Ginger Entered By: Secundino Ginger on 02/14/2018 16:23:05 West, Mckynlie D. ([id number]) -------------------------------------------------------------------------------- Vitals Details Patient Name: RAEDYN, KLINCK D. Date of Service: 02/14/2018 3:45 PM Medical Record Number: [id number] Patient Account Number: [id number] Date of Birth/Sex: 03/28/1935 (83 y.o. F) Treating RN: Secundino Ginger Primary Care Carmon Brigandi: Ceasar Lund Other Clinician: Referring Malaky Tetrault: Ceasar Lund Treating Loyce Klasen/Extender: Melburn Hake, HOYT Weeks in Treatment: 7 Vital Signs Time Taken: 16:10 Temperature (F): 97.6 Height (in): 61 Pulse (bpm): 63 Weight (lbs): 179 Respiratory Rate (breaths/min): 16 Body Mass Index (BMI): 33.8 Blood Pressure (mmHg): 142/62 Reference Range: 80 - 120 mg / dl Electronic Signature(s) Signed: 02/15/2018 9:53:02 AM By:  Secundino Ginger Entered By: Secundino Ginger on 02/14/2018 16:10:43

## 2018-02-20 ENCOUNTER — Ambulatory Visit: Payer: Medicare Other | Admitting: Physician Assistant

## 2018-02-27 ENCOUNTER — Ambulatory Visit: Payer: Medicare Other | Admitting: Physician Assistant

## 2018-03-06 ENCOUNTER — Ambulatory Visit: Payer: Medicare Other | Admitting: Physician Assistant

## 2018-03-07 ENCOUNTER — Ambulatory Visit: Payer: Medicare Other | Admitting: Physician Assistant

## 2018-03-22 DIAGNOSIS — M810 Age-related osteoporosis without current pathological fracture: Secondary | ICD-10-CM | POA: Insufficient documentation

## 2018-05-03 ENCOUNTER — Encounter: Payer: Self-pay | Admitting: Podiatry

## 2018-05-03 ENCOUNTER — Ambulatory Visit (INDEPENDENT_AMBULATORY_CARE_PROVIDER_SITE_OTHER): Payer: Medicare Other | Admitting: Podiatry

## 2018-05-03 DIAGNOSIS — B351 Tinea unguium: Secondary | ICD-10-CM | POA: Diagnosis not present

## 2018-05-03 DIAGNOSIS — M79676 Pain in unspecified toe(s): Secondary | ICD-10-CM

## 2018-05-03 NOTE — Progress Notes (Signed)
Presents today for chief complaint of painful elongated toenails.  Objective: Vital signs stable alert and oriented x3.  There is no erythema edema/drainage odor toenails are long thick yellow dystrophic-like mycotic no open lesions or wounds.  Assessment: Pain in limb secondary to onychomycosis.  Plan: Debridement of toenails 1 through 5 bilateral.

## 2018-08-07 ENCOUNTER — Ambulatory Visit: Payer: Medicare Other | Admitting: Podiatry

## 2018-08-23 ENCOUNTER — Ambulatory Visit: Payer: Medicare Other | Admitting: Podiatry

## 2018-12-11 ENCOUNTER — Emergency Department: Payer: Medicare Other

## 2018-12-11 ENCOUNTER — Emergency Department
Admission: EM | Admit: 2018-12-11 | Discharge: 2018-12-13 | Disposition: A | Discharge status: Skilled Nursing Facility | Payer: Medicare Other | Attending: Emergency Medicine | Admitting: Emergency Medicine

## 2018-12-11 ENCOUNTER — Encounter: Payer: Self-pay | Admitting: Emergency Medicine

## 2018-12-11 ENCOUNTER — Other Ambulatory Visit: Payer: Self-pay

## 2018-12-11 DIAGNOSIS — Z7982 Long term (current) use of aspirin: Secondary | ICD-10-CM | POA: Diagnosis not present

## 2018-12-11 DIAGNOSIS — J45909 Unspecified asthma, uncomplicated: Secondary | ICD-10-CM | POA: Diagnosis not present

## 2018-12-11 DIAGNOSIS — E213 Hyperparathyroidism, unspecified: Secondary | ICD-10-CM | POA: Diagnosis not present

## 2018-12-11 DIAGNOSIS — R531 Weakness: Secondary | ICD-10-CM | POA: Insufficient documentation

## 2018-12-11 DIAGNOSIS — Z20828 Contact with and (suspected) exposure to other viral communicable diseases: Secondary | ICD-10-CM | POA: Insufficient documentation

## 2018-12-11 DIAGNOSIS — N184 Chronic kidney disease, stage 4 (severe): Secondary | ICD-10-CM | POA: Insufficient documentation

## 2018-12-11 DIAGNOSIS — R109 Unspecified abdominal pain: Secondary | ICD-10-CM

## 2018-12-11 DIAGNOSIS — Z79899 Other long term (current) drug therapy: Secondary | ICD-10-CM | POA: Insufficient documentation

## 2018-12-11 DIAGNOSIS — I130 Hypertensive heart and chronic kidney disease with heart failure and stage 1 through stage 4 chronic kidney disease, or unspecified chronic kidney disease: Secondary | ICD-10-CM | POA: Insufficient documentation

## 2018-12-11 DIAGNOSIS — I501 Left ventricular failure: Secondary | ICD-10-CM | POA: Insufficient documentation

## 2018-12-11 DIAGNOSIS — R1030 Lower abdominal pain, unspecified: Secondary | ICD-10-CM | POA: Insufficient documentation

## 2018-12-11 LAB — COMPREHENSIVE METABOLIC PANEL
ALT: 21 U/L (ref 0–44)
AST: 35 U/L (ref 15–41)
Albumin: 3.7 g/dL (ref 3.5–5.0)
Alkaline Phosphatase: 55 U/L (ref 38–126)
Anion gap: 10 (ref 5–15)
BUN: 53 mg/dL — ABNORMAL HIGH (ref 8–23)
CO2: 17 mmol/L — ABNORMAL LOW (ref 22–32)
Calcium: 9.4 mg/dL (ref 8.9–10.3)
Chloride: 109 mmol/L (ref 98–111)
Creatinine, Ser: 2.69 mg/dL — ABNORMAL HIGH (ref 0.44–1.00)
GFR calc Af Amer: 18 mL/min — ABNORMAL LOW (ref >60)
GFR calc non Af Amer: 16 mL/min — ABNORMAL LOW (ref >60)
Glucose, Bld: 94 mg/dL (ref 70–99)
Potassium: 4.1 mmol/L (ref 3.5–5.1)
Sodium: 136 mmol/L (ref 135–145)
Total Bilirubin: 2 mg/dL — ABNORMAL HIGH (ref 0.3–1.2)
Total Protein: 7.1 g/dL (ref 6.5–8.1)

## 2018-12-11 LAB — CBC
HCT: 34 % — ABNORMAL LOW (ref 36.0–46.0)
Hemoglobin: 10.9 g/dL — ABNORMAL LOW (ref 12.0–15.0)
MCH: 29.6 pg (ref 26.0–34.0)
MCHC: 32.1 g/dL (ref 30.0–36.0)
MCV: 92.4 fL (ref 80.0–100.0)
Platelets: 213 10*3/uL (ref 150–400)
RBC: 3.68 MIL/uL — ABNORMAL LOW (ref 3.87–5.11)
RDW: 15.3 % (ref 11.5–15.5)
WBC: 7 10*3/uL (ref 4.0–10.5)
nRBC: 0 % (ref 0.0–0.2)

## 2018-12-11 LAB — URINALYSIS, COMPLETE (UACMP) WITH MICROSCOPIC
Bilirubin Urine: NEGATIVE
Glucose, UA: NEGATIVE mg/dL
Ketones, ur: NEGATIVE mg/dL
Leukocytes,Ua: NEGATIVE
Nitrite: NEGATIVE
Protein, ur: 30 mg/dL — AB
Specific Gravity, Urine: 1.011 (ref 1.005–1.030)
Squamous Epithelial / LPF: NONE SEEN (ref 0–5)
pH: 5 (ref 5.0–8.0)

## 2018-12-11 LAB — SARS CORONAVIRUS 2 BY RT PCR (HOSPITAL ORDER, PERFORMED IN ~~LOC~~ HOSPITAL LAB): SARS Coronavirus 2: NEGATIVE

## 2018-12-11 LAB — TROPONIN I (HIGH SENSITIVITY)
Troponin I (High Sensitivity): 70 ng/L — ABNORMAL HIGH (ref <18)
Troponin I (High Sensitivity): 65 ng/L — ABNORMAL HIGH (ref <18)

## 2018-12-11 MED ORDER — SODIUM CHLORIDE 0.9% FLUSH: 3.0000 mL | Freq: Once | INTRAVENOUS | Status: DC

## 2018-12-11 NOTE — ED Notes (Signed)
Patient transported to CT 

## 2018-12-11 NOTE — Evaluation (Signed)
Physical Therapy Evaluation Patient Details Name: Misty Berry MRN: [id number] DOB: 1935/03/19 Today's Date: 12/11/2018   History of Present Illness  84 y.o. female who presents to the emergency department today because of concern for lower abdominal pain. She denies any bad odor to her urine or painful urination. She says that they tested her urine and that it was inconclusive. The patient denies any fevers. She states that she has not had any change to bowel movements. Denies any fevers. Has not had any similar pains in the past. CT  of abdomen clear.    Clinical Impression  Pt alert, oriented, behavior and cognition WFLs during session. PT reported pain in L shoulder, has been going on for a few days, RN aware. Pt reported that she lives with a roommate (82 year old female who is unable to provide physical assist) with flight of stairs to enter home. Pt reported with the exception of the last few days she is able to ambulate household distances with rolling walker, and is mod I for ADLs. Pt stated she has not been able to stand/walk in a few days.   Pt demonstrated bed mobility with minA and HOB elevated. Sat EOB for 1-4minutes with fair balance. Sit <>stand with RW and minA from elevated surface. Pt was able to take several side steps towards [facility] with minA and RW, minA necessary due to pt unsteadiness and anxiety, heavy use of UE to maintain standing.  Overall the patient demonstrated deficits (see "PT Problem List") that impede the patient's functional abilities, safety, and mobility and would benefit from skilled PT intervention. Recommendation is STR due to acute decline in functional status, decreased caregiver support, and inaccessible home environment.     Follow Up Recommendations SNF    Equipment Recommendations  Other (comment)(TBD, pt has 2 wheeled walker at home)    Recommendations for Other Services       Precautions / Restrictions Precautions Precautions:  Fall Restrictions Weight Bearing Restrictions: No      Mobility  Bed Mobility Overal bed mobility: Needs Assistance Bed Mobility: Supine to Sit;Sit to Supine     Supine to sit: Min assist;HOB elevated Sit to supine: Min assist;HOB elevated   General bed mobility comments: Pt participation improved with extended time  Transfers Overall transfer level: Needs assistance Equipment used: Rolling walker (2 wheeled) Transfers: Sit to/from Stand Sit to Stand: Min assist;From elevated surface            Ambulation/Gait   Gait Distance (Feet): 2 Feet Assistive device: Rolling walker (2 wheeled)       General Gait Details: Pt able to take side steps towards head of bed with minA for safety/unsteadiness. heavy use of RW. Pt complainted of significant fatigue at end of mobility  Stairs            Wheelchair Mobility    Modified Rankin (Stroke Patients Only)       Balance Overall balance assessment: Needs assistance Sitting-balance support: Feet supported Sitting balance-Leahy Scale: Fair       Standing balance-Leahy Scale: Poor Standing balance comment: Reliant on RW                             Pertinent Vitals/Pain Pain Assessment: Faces Faces Pain Scale: Hurts even more Pain Location: L shoulder with movement Pain Descriptors / Indicators: Aching;Sharp;Grimacing Pain Intervention(s): Limited activity within patient's tolerance;Monitored during session;Repositioned    Home Living Family/patient expects to  be discharged to:: Private residence Living Arrangements: Non-relatives/Friends Available Help at Discharge: Friend(s)(unable to provide physical assist) Type of Home: House Home Access: Stairs to enter Entrance Stairs-Rails: Psychiatric nurse of Steps: flight of stairs Home Layout: One level Home Equipment: Environmental consultant - 2 wheels;Cane - single point      Prior Function Level of Independence: Independent with assistive  device(s)         Comments: Pt reported in the last several days she has not been able to stand, or walk. Prior to a few days ago pt ambulated with 2 wheeled walker, independent in ADLs.     Hand Dominance   Dominant Hand: Right    Extremity/Trunk Assessment   Upper Extremity Assessment Upper Extremity Assessment: Generalized weakness    Lower Extremity Assessment Lower Extremity Assessment: Generalized weakness       Communication   Communication: No difficulties  Cognition Arousal/Alertness: Awake/alert Behavior During Therapy: WFL for tasks assessed/performed Overall Cognitive Status: Within Functional Limits for tasks assessed                                        General Comments      Exercises Other Exercises Other Exercises: Pt unable to perform full range SLR bilaterally due to weakness   Assessment/Plan    PT Assessment Patient needs continued PT services  PT Problem List Decreased strength;Decreased mobility;Decreased range of motion;Decreased activity tolerance;Decreased balance       PT Treatment Interventions DME instruction;Therapeutic exercise;Gait training;Balance training;Stair training;Neuromuscular re-education;Functional mobility training;Therapeutic activities;Patient/family education    PT Goals (Current goals can be found in the Care Plan section)  Acute Rehab PT Goals Patient Stated Goal: to return to PLOF PT Goal Formulation: With patient Time For Goal Achievement: 12/25/18 Potential to Achieve Goals: Good    Frequency Min 2X/week   Barriers to discharge Decreased caregiver support;Inaccessible home environment      Co-evaluation               AM-PAC PT "6 Clicks" Mobility  Outcome Measure Help needed turning from your back to your side while in a flat bed without using bedrails?: A Lot Help needed moving from lying on your back to sitting on the side of a flat bed without using bedrails?: A Lot Help  needed moving to and from a bed to a chair (including a wheelchair)?: A Lot Help needed standing up from a chair using your arms (e.g., wheelchair or bedside chair)?: A Little Help needed to walk in hospital room?: A Lot Help needed climbing 3-5 steps with a railing? : Total 6 Click Score: 12    End of Session Equipment Utilized During Treatment: Gait belt Activity Tolerance: Patient limited by fatigue Patient left: in bed;with call bell/phone within reach;Other (comment)(with case management in the room)   PT Visit Diagnosis: Unsteadiness on feet (R26.81);Other abnormalities of gait and mobility (R26.89);Difficulty in walking, not elsewhere classified (R26.2);Muscle weakness (generalized) (M62.81)    Time: 3976-7341 PT Time Calculation (min) (ACUTE ONLY): 19 min   Charges:   PT Evaluation $PT Eval Low Complexity: 1 Low          Lieutenant Diego PT, DPT 4:28 PM,12/11/18 [phone]

## 2018-12-11 NOTE — ED Notes (Signed)
Pt states her brother has her living will but lives in [place] but remains her emergency contact. Pt gave 2 numbers for Beckie Salts [phone] and [phone]

## 2018-12-11 NOTE — ED Notes (Addendum)
Per Dr Archie Balboa- pt given some graham crackers to eat

## 2018-12-11 NOTE — Discharge Instructions (Addendum)
Please seek medical attention for any high fevers, chest pain, shortness of breath, change in behavior, persistent vomiting, bloody stool or any other new or concerning symptoms.  

## 2018-12-11 NOTE — NC FL2 (Signed)
[place] LEVEL OF CARE SCREENING TOOL     IDENTIFICATION  Patient Name: Misty Berry Birthdate: 01/23/1935 Sex: female Admission Date (Current Location): 12/11/2018  [place] and [place] Number:  Engineering geologist and Address:  [facility], [address], [place], [place] [address]      Provider Number: [id number]  Attending Physician Name and Address:  No att. providers found  Relative Name and Phone Number:  Iva Lento, [phone]    Current Level of Care: Hospital Recommended Level of Care: [place] Prior Approval Number:    Date Approved/Denied:   PASRR Number: [id number]  Discharge Plan: SNF    Current Diagnoses: Patient Active Problem List   Diagnosis Date Noted  . Acid reflux 08/27/2015  . Cellulitis 08/27/2015  . Abnormal bruising 08/27/2015  . Arthritis, degenerative 08/27/2015  . OP (osteoporosis) 08/27/2015  . Presence of artificial eye 08/27/2015  . History of surgical procedure 08/27/2015  . Hypertensive heart disease with stage 4 chronic kidney disease ([place]) 04/30/2015  . Laceration of forearm without foreign body 04/30/2015  . Venous insufficiency of leg 02/28/2015  . Gluteal tendinitis 01/22/2015  . Degenerative arthritis of lumbar spine 12/12/2014  . Back muscle spasm 11/14/2014  . Degeneration of intervertebral disc of lumbar region 11/14/2014  . Neuritis or radiculitis due to rupture of lumbar intervertebral disc 11/14/2014  . Allergic rhinitis 08/31/2014  . Clinical depression 02/19/2014  . Gait instability 02/19/2014  . Benign hypertensive heart and kidney disease 10/31/2013  . Left heart failure ([place]) 10/31/2013  . Branch retinal vein occlusion 09/11/2013  . Asthma 06/20/2013  . Essential hypertension 06/20/2013  . Follow-up examination 04/30/2013  . Acute renal failure ([place]) 02/16/2013  . Fall 02/16/2013  . Closed fracture of part of neck of femur ([place]) 02/16/2013   . Cellulitis and abscess of leg 10/30/2012  . Chronic kidney disease, stage III (moderate) ([place]) 10/30/2012  . Cellulitis of lower extremity 10/30/2012  . Chronic kidney disease, stage IV (severe) ([place]) 10/30/2012  . Central retinal edema, cystoid 01/31/2012  . Impaired vision 12/17/2011    Orientation RESPIRATION BLADDER Height & Weight     Self, Time, Situation, Place  Normal Continent Weight: 180 lb (81.6 kg) Height:  5\' 2"  (157.5 cm)  BEHAVIORAL SYMPTOMS/MOOD NEUROLOGICAL BOWEL NUTRITION STATUS      Continent Diet  AMBULATORY STATUS COMMUNICATION OF NEEDS Skin   Extensive Assist Verbally Normal                       Personal Care Assistance Level of Assistance  Bathing, Feeding, Dressing Bathing Assistance: Maximum assistance Feeding assistance: Limited assistance Dressing Assistance: Maximum assistance     Functional Limitations Info  Sight, Hearing, Speech Sight Info: Adequate Hearing Info: Adequate Speech Info: Adequate    SPECIAL CARE FACTORS FREQUENCY  PT (By licensed PT), OT (By licensed OT)     PT Frequency: 5 times per week OT Frequency: 5 times per week            Contractures Contractures Info: Not present    Additional Factors Info  Code Status, Allergies  FULL CODE   Allergies Info: Ivp dye, lactose, Methylprednisolone, peanuts, Vancomycin, wellbutrin           Current Medications (12/11/2018):  This is the current hospital active medication list Current Facility-Administered Medications  Medication Dose Route Frequency Provider Last Rate Last Dose  . sodium chloride flush (NS) 0.9 % injection 3  mL  3 mL Intravenous Once Nance Pear, MD       Current Outpatient Medications  Medication Sig Dispense Refill  . amLODipine (NORVASC) 2.5 MG tablet Take 2.5 mg by mouth daily.   3  . aspirin EC 81 MG tablet Take by mouth.    . budesonide-formoterol (SYMBICORT) 160-4.5 MCG/ACT inhaler Inhale 1 puff into the lungs 2 (two) times daily.      . calcium-vitamin D (OSCAL WITH D) 500-200 MG-UNIT TABS tablet Take 1 tablet by mouth daily.     . clobetasol ointment (TEMOVATE) 1.15 % Apply 1 application topically 2 (two) times daily. To shins    . esomeprazole (NEXIUM) 40 MG capsule Take 40 mg by mouth daily at 12 noon.    . ferrous gluconate (FERGON) 324 MG tablet Take 324 mg by mouth daily.     Marland Kitchen L-Methylfolate-Algae (DEPLIN 15) 15-90.314 MG CAPS Take 15 mg by mouth daily.     Marland Kitchen L-Theanine 100 MG CAPS Take 200 mg by mouth 4 (four) times daily.    Marland Kitchen losartan (COZAAR) 25 MG tablet Take 50 mg by mouth daily.   1  . metoprolol succinate (TOPROL-XL) 25 MG 24 hr tablet Take 12.5 mg by mouth daily.   3  . MILK THISTLE PO Take 1 tablet by mouth daily.     . Multiple Vitamins-Minerals (CENTRUM PO) Take 1 tablet by mouth daily.     . Omega-3 Fatty Acids (FISH OIL) 1000 MG CAPS Take 1,000 mg by mouth 4 (four) times daily.     . S-Adenosylmethionine (SAM-E) 200 MG TABS Take 200 mg by mouth 2 (two) times a day.    . torsemide (DEMADEX) 20 MG tablet Take 20 mg by mouth daily.   2  . vitamin B-12 (CYANOCOBALAMIN) 1000 MCG tablet Take 1,000 mcg by mouth daily.     Marland Kitchen ZOLOFT 100 MG tablet Take 200 mg by mouth daily.        Discharge Medications: Please see discharge summary for a list of discharge medications.  Relevant Imaging Results:  Relevant Lab Results:   Additional Information SSN:  [id number]    Healthcare proxy: Lelan Pons, [place], LCSW

## 2018-12-11 NOTE — ED Notes (Signed)
Provided pt with warm blankets, cola, and tv remote. Pt verbalized comfort.

## 2018-12-11 NOTE — ED Notes (Signed)
PT at bedside.

## 2018-12-11 NOTE — ED Notes (Signed)
Dr Cherylann Banas aware of pt concerns

## 2018-12-11 NOTE — TOC Initial Note (Signed)
Transition of Care [facility]) - Initial/Assessment Note    Patient Details  Name: Misty Berry MRN: [id number] Date of Birth: July 27, 1934  [facility] CM/SW Contact:    Fredric Mare, LCSW Phone Number: 12/11/2018, 4:21 PM  Clinical Narrative:                 Patient is an 84 year old female that presents to the ED for weakness. Patient states that she currently lives with her roommate/friend, that is older than her. Patient shared that she has a walker and "cannot walk without it." Patient does not feel safe going home at this time due to having more difficulty walking, and would like to go to a SNF.  Patient does not have any home health services.  Patient gave permission for CSW to start bed search.    Expected Discharge Plan: Skilled Nursing Facility Barriers to Discharge: Continued Medical Work up   Patient Goals and CMS Choice   CMS Medicare.gov Compare Post Acute Care list provided to:: Patient Choice offered to / list presented to : Patient  Expected Discharge Plan and Services Expected Discharge Plan: [place]   Discharge Planning Services: CM Consult Post Acute Care Choice: [place] Living arrangements for the past 2 months: Single Family Home                                      Prior Living Arrangements/Services Living arrangements for the past 2 months: Single Family Home Lives with:: Friends(patient lives with roommate that is older than her) Patient language and need for interpreter reviewed:: Yes Do you feel safe going back to the place where you live?: No   patient states that she cannot get up and walk on her own  Need for Family Participation in Patient Care: No (Comment) Care giver support system in place?: No (comment) Current home services: DME(patient has walker, and states that she cannot walk without it) Criminal Activity/Legal Involvement Pertinent to Current Situation/Hospitalization: No - Comment  as needed  Activities of Daily Living      Permission Sought/Granted Permission sought to share information with : Other (comment)(friend/roommate) Permission granted to share information with : Yes, Verbal Permission Granted  Share Information with NAME: Remo Lipps     Permission granted to share info w Relationship: Friend     Emotional Assessment Appearance:: Appears stated age, Developmentally appropriate Attitude/Demeanor/Rapport: Gracious, Engaged Affect (typically observed): Calm, Pleasant Orientation: : Oriented to Self, Oriented to Place, Oriented to  Time, Oriented to Situation Alcohol / Substance Use: Never Used Psych Involvement: No (comment)  Admission diagnosis:  weakness ems Patient Active Problem List   Diagnosis Date Noted  . Acid reflux 08/27/2015  . Cellulitis 08/27/2015  . Abnormal bruising 08/27/2015  . Arthritis, degenerative 08/27/2015  . OP (osteoporosis) 08/27/2015  . Presence of artificial eye 08/27/2015  . History of surgical procedure 08/27/2015  . Hypertensive heart disease with stage 4 chronic kidney disease ([place]) 04/30/2015  . Laceration of forearm without foreign body 04/30/2015  . Venous insufficiency of leg 02/28/2015  . Gluteal tendinitis 01/22/2015  . Degenerative arthritis of lumbar spine 12/12/2014  . Back muscle spasm 11/14/2014  . Degeneration of intervertebral disc of lumbar region 11/14/2014  . Neuritis or radiculitis due to rupture of lumbar intervertebral disc 11/14/2014  . Allergic rhinitis 08/31/2014  . Clinical depression 02/19/2014  . Gait instability 02/19/2014  .  Benign hypertensive heart and kidney disease 10/31/2013  . Left heart failure ([place]) 10/31/2013  . Branch retinal vein occlusion 09/11/2013  . Asthma 06/20/2013  . Essential hypertension 06/20/2013  . Follow-up examination 04/30/2013  . Acute renal failure ([place]) 02/16/2013  . Fall 02/16/2013  . Closed fracture of part of neck of femur ([place]) 02/16/2013  .  Cellulitis and abscess of leg 10/30/2012  . Chronic kidney disease, stage III (moderate) ([place]) 10/30/2012  . Cellulitis of lower extremity 10/30/2012  . Chronic kidney disease, stage IV (severe) ([place]) 10/30/2012  . Central retinal edema, cystoid 01/31/2012  . Impaired vision 12/17/2011   PCP:  Nelia Shi, MD Pharmacy:   [facility] [place], [place] [facility] [address] AT [place] [facility] [place] [address] Phone: [phone] Fax: [phone]     Social Determinants of Health (SDOH) Interventions    Readmission Risk Interventions No flowsheet data found.

## 2018-12-11 NOTE — ED Notes (Signed)
Pt assisted to bathroom by this RN and Romie Minus

## 2018-12-11 NOTE — ED Triage Notes (Signed)
weakness since yesterday. Denies cough or dysuria. MAE well. Noted L eyelid and moth droop, denies history of however is noted in picture on file in chart.

## 2018-12-11 NOTE — ED Provider Notes (Signed)
[facility] Emergency Department Provider Note  ____________________________________________   I have reviewed the triage vital signs and the nursing notes.   HISTORY  Chief Complaint Abdominal Pain  History limited by: Not Limited   HPI Misty Berry is a 84 y.o. female who presents to the emergency department today because of concern for lower abdominal pain. Patient states that it started yesterday. She denies any bad odor to her urine or painful urination. She says that they tested her urine and that it was inconclusive. The patient denies any fevers. She states that she has not had any change to bowel movements. Denies any fevers. Has not had any similar pains in the past.   Records reviewed. Per medical record review patient has a history of asthma, GERD, HLD.   Past Medical History:  Diagnosis Date  . Asthma   . Bruises easily   . Cellulitis   . Cough   . DDD (degenerative disc disease), lumbar   . GERD (gastroesophageal reflux disease)   . HBP (high blood pressure)   . Hypercholesterolemia   . Hyperparathyroidism ([place])   . Osteoarthritis   . Osteoporosis   . Rash   . Renal disorder    kidney disease  . Swelling     Patient Active Problem List   Diagnosis Date Noted  . Acid reflux 08/27/2015  . Cellulitis 08/27/2015  . Abnormal bruising 08/27/2015  . Arthritis, degenerative 08/27/2015  . OP (osteoporosis) 08/27/2015  . Presence of artificial eye 08/27/2015  . History of surgical procedure 08/27/2015  . Hypertensive heart disease with stage 4 chronic kidney disease ([place]) 04/30/2015  . Laceration of forearm without foreign body 04/30/2015  . Venous insufficiency of leg 02/28/2015  . Gluteal tendinitis 01/22/2015  . Degenerative arthritis of lumbar spine 12/12/2014  . Back muscle spasm 11/14/2014  . Degeneration of intervertebral disc of lumbar region 11/14/2014  . Neuritis or radiculitis due to rupture of lumbar intervertebral disc  11/14/2014  . Allergic rhinitis 08/31/2014  . Clinical depression 02/19/2014  . Gait instability 02/19/2014  . Benign hypertensive heart and kidney disease 10/31/2013  . Left heart failure ([place]) 10/31/2013  . Branch retinal vein occlusion 09/11/2013  . Asthma 06/20/2013  . Essential hypertension 06/20/2013  . Follow-up examination 04/30/2013  . Acute renal failure ([place]) 02/16/2013  . Fall 02/16/2013  . Closed fracture of part of neck of femur ([place]) 02/16/2013  . Cellulitis and abscess of leg 10/30/2012  . Chronic kidney disease, stage III (moderate) ([place]) 10/30/2012  . Cellulitis of lower extremity 10/30/2012  . Chronic kidney disease, stage IV (severe) ([place]) 10/30/2012  . Central retinal edema, cystoid 01/31/2012  . Impaired vision 12/17/2011    Past Surgical History:  Procedure Laterality Date  . APPENDECTOMY    . BREAST REDUCTION SURGERY    . CHOLECYSTECTOMY    . EYE SURGERY Left   . FEMUR SURGERY    . FOOT SURGERY Right   . GALLBLADDER SURGERY    . LIPECTOMY    . PAROTID GLAND TUMOR EXCISION    . THYROID SURGERY    . TONSILLECTOMY    . TUBAL LIGATION    . VITRECTOMY      Prior to Admission medications   Medication Sig Start Date End Date Taking? Authorizing Provider  amLODipine (NORVASC) 2.5 MG tablet Take 2.5 mg by mouth daily.  12/13/17  Yes [provider]  aspirin EC 81 MG tablet Take by mouth.   Yes [provider]  budesonide-formoterol (SYMBICORT) 160-4.5 MCG/ACT inhaler Inhale 1 puff into the lungs 2 (two) times daily.    Yes [provider]  esomeprazole (NEXIUM) 40 MG capsule Take 40 mg by mouth daily at 12 noon.   Yes [provider]  losartan (COZAAR) 25 MG tablet Take 50 mg by mouth daily.  06/18/15  Yes [provider]  ZOLOFT 100 MG tablet Take 200 mg by mouth daily.  06/07/15  Yes [provider]  acetaminophen (TYLENOL) 650 MG CR tablet Take by mouth.    [provider]  amLODipine (NORVASC)  5 MG tablet TK 1 T PO  DAILY 07/03/15   [provider]  atorvastatin (LIPITOR) 40 MG tablet Take by mouth. 09/09/16   [provider]  bromfenac (XIBROM) 0.09 % ophthalmic solution Apply to eye. 12/27/11   [provider]  calcium-vitamin D (OSCAL WITH D) 500-200 MG-UNIT TABS tablet Take by mouth.    [provider]  clobetasol ointment (TEMOVATE) 7.35 % Apply 1 application topically 2 (two) times daily.    [provider]  denosumab (PROLIA) 60 MG/ML SOSY injection Inject into the skin. 09/08/16   [provider]  ferrous gluconate (FERGON) 324 MG tablet Take by mouth.    [provider]  flintstones complete (FLINTSTONES) 60 MG chewable tablet Chew by mouth.    [provider]  L-Methylfolate-Algae (DEPLIN 15 PO) Take by mouth daily.    [provider]  metoprolol succinate (TOPROL-XL) 25 MG 24 hr tablet TK 1/2 T PO D 06/17/15   [provider]  MILK THISTLE PO Take by mouth daily.    [provider]  Multiple Vitamins-Minerals (CENTRUM PO) Take by mouth daily.    [provider]  Omega-3 Fatty Acids (FISH OIL) 1000 MG CAPS Take by mouth daily.    [provider]  Spacer/Aero-Holding Chambers (VALVED HOLDING CHAMBER) [place] USE AS DIRECTED 12/11/12   [provider]  torsemide (DEMADEX) 20 MG tablet  09/16/17   [provider]  vitamin B-12 (CYANOCOBALAMIN) 1000 MCG tablet Take by mouth.    [provider]    Allergies Ivp dye [iodinated diagnostic agents], Lactose, Methylprednisolone, Other, Peanuts [peanut oil], Vancomycin, and Wellbutrin [bupropion]  No family history on file.  Social History Social History   Tobacco Use  . Smoking status: Never Smoker  . Smokeless tobacco: Never Used  Substance Use Topics  . Alcohol use: No  . Drug use: No    Review of Systems Constitutional: No fever/chills. Positive for weakness.  Eyes: No visual  changes. ENT: No sore throat. Cardiovascular: Denies chest pain. Respiratory: Denies shortness of breath. Gastrointestinal: Positive for lower abdominal pain.  Genitourinary: Negative for dysuria. Musculoskeletal: Negative for back pain. Skin: Negative for rash. Neurological: Negative for headaches, focal weakness or numbness.  ____________________________________________   PHYSICAL EXAM:  VITAL SIGNS: ED Triage Vitals  Enc Vitals Group     BP 12/11/18 1035 (!) 171/93     Pulse Rate 12/11/18 1035 81     Resp 12/11/18 1035 18     Temp 12/11/18 1035 98.3 F (36.8 C)     Temp Source 12/11/18 1035 Oral     SpO2 12/11/18 1035 97 %     Weight 12/11/18 1040 180 lb (81.6 kg)     Height 12/11/18 1040 5\' 2"  (1.575 m)     Head Circumference --      [facility] --      Pain Score 12/11/18 1040 6  Constitutional: Alert and oriented.  Eyes: Conjunctivae are normal.  ENT      Head: Normocephalic and atraumatic.      Nose: No congestion/rhinnorhea.      Mouth/Throat: Mucous membranes are moist.      Neck: No stridor. Hematological/Lymphatic/Immunilogical: No cervical lymphadenopathy. Cardiovascular: Normal rate, regular rhythm.  No murmurs, rubs, or gallops. Respiratory: Normal respiratory effort without tachypnea nor retractions. Breath sounds are clear and equal bilaterally. No wheezes/rales/rhonchi. Gastrointestinal: Soft and minimally tender in the lower abdomen.  Genitourinary: Deferred Musculoskeletal: Normal range of motion in all extremities. No lower extremity edema. Neurologic:  Normal speech and language. No gross focal neurologic deficits are appreciated.  Skin:  Skin is warm, dry and intact. No rash noted. Psychiatric: Mood and affect are normal. Speech and behavior are normal. Patient exhibits appropriate insight and judgment.  ____________________________________________    LABS (pertinent positives/negatives)  Trop 70->65 CMP na 136, k 4.1, cr 2.69 UA hazy, 0-5  rbc, wbc. Rare bacteria CBC wbc 7.0, hgb 10.9, plt 213 ____________________________________________   EKG  I, Nance Pear, attending physician, personally viewed and interpreted this EKG  EKG Time: 1039 Rate: 76 Rhythm: normal sinus rhythm Axis: normal Intervals: qtc 469 QRS: narrow, q waves v1, v2 ST changes: no st elevation Impression: abnormal ekg  ____________________________________________    RADIOLOGY  CXR No edema, no consolidation  CT abd/pel No acute abnormality  ____________________________________________   PROCEDURES  Procedures  ____________________________________________   INITIAL IMPRESSION / ASSESSMENT AND PLAN / ED COURSE  Pertinent labs & imaging results that were available during my care of the patient were reviewed by me and considered in my medical decision making (see chart for details).   Patient presented to the emergency department today because of concerns for lower abdominal pain, possible UTI. UA here not consistent with UTI. Did obtain CT abd/pel which did not show any acute abnormality. Patient's pain did resolve during stay here in the emergency department. Initial troponin elevated however repeat had decreased. At this point doubt ACS given lack of chest pain, lack of concerning EKG findings and decreasing troponin.   ___________________________________________   FINAL CLINICAL IMPRESSION(S) / ED DIAGNOSES  Final diagnoses:  Weakness  Abdominal pain, unspecified abdominal location     Note: This dictation was prepared with Dragon dictation. Any transcriptional errors that result from this process are unintentional     Nance Pear, MD 12/11/18 [phone]

## 2018-12-11 NOTE — ED Notes (Signed)
Pt moved to inpatient bed at this time for comfort.

## 2018-12-11 NOTE — ED Notes (Signed)
Pt states she cannot go home because there is no one there who can help her get up and walk

## 2018-12-11 NOTE — ED Notes (Addendum)
Pt advised that her friend was wanting to talk with her- pt has cell phone to call

## 2018-12-11 NOTE — ED Notes (Signed)
Pt given [place] sandwich tray and water to drink. Sat up while eating.

## 2018-12-11 NOTE — ED Provider Notes (Signed)
-----------------------------------------   3:45 PM on 12/11/2018 -----------------------------------------  I took over care on this patient from Dr. Archie Balboa.  Per Dr. Archie Balboa, the plan had been to discharge home.  However on further discussion with the patient, she states that she has recently become unable to ambulate.  She has no focal neurologic deficits to suggest acute CNS process.  The patient states that she has become deconditioned in this way before and benefited from rehab.  At this time the patient does not meet any criteria for admission.  Since it will be unsafe for her to be discharged home, I will order PT and social work consults as she may need to go to rehab again.   Arta Silence, MD 12/11/18 [phone]

## 2018-12-11 NOTE — TOC Progression Note (Signed)
Transition of Care [facility]) - Progression Note    Patient Details  Name: Misty Berry MRN: [id number] Date of Birth: [birth year]  [facility]) CM/SW Contact  Marshell Garfinkel, RN Phone Number: 12/11/2018, 5:36 PM  Clinical Narrative:     Confidential message left for brother Jenny Reichmann [phone] as per patient he is her health care proxy.  She did not want to decide on code status and states "I want him to decide".  She would like to remain full-code status at this time.  She has 3 SNF bed offers- [facility], [facility] and [facility]. She picked [facility].    Expected Discharge Plan: [place] Barriers to Discharge: Continued Medical Work up  Expected Discharge Plan and Services Expected Discharge Plan: [place]   Discharge Planning Services: CM Consult Post Acute Care Choice: [place] Living arrangements for the past 2 months: Single Family Home                                       Social Determinants of Health (SDOH) Interventions    Readmission Risk Interventions No flowsheet data found.

## 2018-12-12 DIAGNOSIS — R1030 Lower abdominal pain, unspecified: Secondary | ICD-10-CM | POA: Diagnosis not present

## 2018-12-12 MED ORDER — SAM-E 200 MG PO TABS: 200.0000 mg | ORAL_TABLET | Freq: Two times a day (BID) | ORAL | Status: DC

## 2018-12-12 MED ORDER — FERROUS GLUCONATE 324 (38 FE) MG PO TABS
324.0000 mg | ORAL_TABLET | Freq: Every day | ORAL | Status: DC
  Administered 2018-12-12: 324 mg via ORAL
  Filled 2018-12-12 (×2): qty 1

## 2018-12-12 MED ORDER — LOSARTAN POTASSIUM 50 MG PO TABS
50.0000 mg | ORAL_TABLET | Freq: Every day | ORAL | Status: DC
  Administered 2018-12-12: 50 mg via ORAL
  Filled 2018-12-12: qty 1

## 2018-12-12 MED ORDER — TORSEMIDE 20 MG PO TABS
20.0000 mg | ORAL_TABLET | Freq: Every day | ORAL | Status: DC
  Administered 2018-12-12: 20 mg via ORAL
  Filled 2018-12-12 (×2): qty 1

## 2018-12-12 MED ORDER — AMLODIPINE BESYLATE 5 MG PO TABS
2.5000 mg | ORAL_TABLET | Freq: Every day | ORAL | Status: DC
  Administered 2018-12-12: 2.5 mg via ORAL
  Filled 2018-12-12: qty 1

## 2018-12-12 MED ORDER — SERTRALINE HCL 50 MG PO TABS
200.0000 mg | ORAL_TABLET | Freq: Every day | ORAL | Status: DC
  Administered 2018-12-12: 200 mg via ORAL
  Filled 2018-12-12: qty 4

## 2018-12-12 MED ORDER — ASPIRIN EC 81 MG PO TBEC
81.0000 mg | DELAYED_RELEASE_TABLET | Freq: Every day | ORAL | Status: DC
  Administered 2018-12-12: 81 mg via ORAL
  Filled 2018-12-12: qty 1

## 2018-12-12 MED ORDER — METOPROLOL SUCCINATE ER 25 MG PO TB24
12.5000 mg | ORAL_TABLET | Freq: Every day | ORAL | Status: DC
  Administered 2018-12-12: 12.5 mg via ORAL
  Filled 2018-12-12 (×2): qty 0.5

## 2018-12-12 MED ORDER — VITAMIN B-12 1000 MCG PO TABS
1000.0000 ug | ORAL_TABLET | Freq: Every day | ORAL | Status: DC
  Administered 2018-12-12: 1000 ug via ORAL
  Filled 2018-12-12 (×2): qty 1

## 2018-12-12 NOTE — TOC Progression Note (Addendum)
Transition of Care [facility]) - Progression Note    Patient Details  Name: Misty Berry MRN: [id number] Date of Birth: 01/15/1935  [facility] CM/SW Contact  Tania Malichi Palardy, LCSW Phone Number: 12/12/2018, 8:31 AM  Clinical Narrative:     8:30am - COVID test results, and FL2 have been sent to [facility]. Awaiting bed number from [place] at [facility].   Patient being admitted to [facility].  Bed: 712  Number to report: [phone]   And ask for 700 hall nurse    Expected Discharge Plan: [place] Barriers to Discharge: Continued Medical Work up  Expected Discharge Plan and Services Expected Discharge Plan: [place]   Discharge Planning Services: CM Consult Post Acute Care Choice: [place] Living arrangements for the past 2 months: Single Family Home                                       Social Determinants of Health (SDOH) Interventions    Readmission Risk Interventions No flowsheet data found.

## 2018-12-12 NOTE — ED Provider Notes (Signed)
Notified by social work that patient will be going to [facility] today   Lavonia Drafts, MD 12/12/18 [phone]

## 2018-12-12 NOTE — ED Provider Notes (Signed)
-----------------------------------------   5:56 AM on 12/12/2018 -----------------------------------------   Blood pressure (!) 168/119, pulse 90, temperature 98.3 F (36.8 C), temperature source Oral, resp. rate 18, height 1.575 m (5\' 2" ), weight 81.6 kg, SpO2 100 %.  The patient is sleeping at this time.  Home meds have been ordered by the previous MDs who have overseeing her care while in the department.  PT/OT consults are pending.   Hinda Kehr, MD 12/12/18 [phone]

## 2018-12-12 NOTE — ED Notes (Signed)
[facility]  called

## 2018-12-12 NOTE — TOC Transition Note (Signed)
Transition of Care [facility]) - CM/SW Discharge Note   Patient Details  Name: Misty Berry MRN: [id number] Date of Birth: 07-02-1934  [facility]) CM/SW Contact:  Fredric Mare, LCSW Phone Number: 12/12/2018, 9:31 AM   Clinical Narrative:     Patient will be admitted at [facility]. Patient will be transported by EMS.  Bed: 712  Number to report: [phone]   And ask for 2 hall nurse  Patient notified EDP notified  Patient's RN notified  ED Secretary notified    Final next level of care: Skilled Nursing Facility Barriers to Discharge: Barriers Resolved   Patient Goals and CMS Choice   CMS Medicare.gov Compare Post Acute Care list provided to:: Patient Choice offered to / list presented to : Patient  Discharge Placement                Patient to be transferred to facility by: EMS   Patient and family notified of of transfer: 12/12/18  Discharge Plan and Services   Discharge Planning Services: CM Consult Post Acute Care Choice: [place]                               Social Determinants of Health (SDOH) Interventions     Readmission Risk Interventions No flowsheet data found.

## 2018-12-12 NOTE — ED Notes (Signed)
Patient given coke to drink per request.  Will continue to monitor.

## 2018-12-29 ENCOUNTER — Other Ambulatory Visit: Payer: Self-pay

## 2018-12-29 ENCOUNTER — Emergency Department
Admission: EM | Admit: 2018-12-29 | Discharge: 2018-12-29 | Disposition: A | Discharge status: Home / Self Care | Payer: Medicare Other | Attending: Emergency Medicine | Admitting: Emergency Medicine

## 2018-12-29 ENCOUNTER — Emergency Department: Payer: Medicare Other

## 2018-12-29 DIAGNOSIS — W010XXA Fall on same level from slipping, tripping and stumbling without subsequent striking against object, initial encounter: Secondary | ICD-10-CM | POA: Insufficient documentation

## 2018-12-29 DIAGNOSIS — Y939 Activity, unspecified: Secondary | ICD-10-CM | POA: Insufficient documentation

## 2018-12-29 DIAGNOSIS — Z23 Encounter for immunization: Secondary | ICD-10-CM | POA: Diagnosis not present

## 2018-12-29 DIAGNOSIS — Z79899 Other long term (current) drug therapy: Secondary | ICD-10-CM | POA: Diagnosis not present

## 2018-12-29 DIAGNOSIS — Y999 Unspecified external cause status: Secondary | ICD-10-CM | POA: Insufficient documentation

## 2018-12-29 DIAGNOSIS — S0990XA Unspecified injury of head, initial encounter: Secondary | ICD-10-CM | POA: Diagnosis present

## 2018-12-29 DIAGNOSIS — J45909 Unspecified asthma, uncomplicated: Secondary | ICD-10-CM | POA: Diagnosis not present

## 2018-12-29 DIAGNOSIS — Z7982 Long term (current) use of aspirin: Secondary | ICD-10-CM | POA: Insufficient documentation

## 2018-12-29 DIAGNOSIS — W19XXXA Unspecified fall, initial encounter: Secondary | ICD-10-CM

## 2018-12-29 DIAGNOSIS — Y92488 Other paved roadways as the place of occurrence of the external cause: Secondary | ICD-10-CM | POA: Diagnosis not present

## 2018-12-29 DIAGNOSIS — S065XAA Traumatic subdural hemorrhage with loss of consciousness status unknown, initial encounter: Secondary | ICD-10-CM

## 2018-12-29 DIAGNOSIS — S065X9A Traumatic subdural hemorrhage with loss of consciousness of unspecified duration, initial encounter: Secondary | ICD-10-CM | POA: Insufficient documentation

## 2018-12-29 DIAGNOSIS — Y929 Unspecified place or not applicable: Secondary | ICD-10-CM | POA: Diagnosis not present

## 2018-12-29 LAB — COMPREHENSIVE METABOLIC PANEL
ALT: 16 U/L (ref 0–44)
AST: 18 U/L (ref 15–41)
Albumin: 3.4 g/dL — ABNORMAL LOW (ref 3.5–5.0)
Alkaline Phosphatase: 72 U/L (ref 38–126)
Anion gap: 13 (ref 5–15)
BUN: 64 mg/dL — ABNORMAL HIGH (ref 8–23)
CO2: 23 mmol/L (ref 22–32)
Calcium: 9.4 mg/dL (ref 8.9–10.3)
Chloride: 101 mmol/L (ref 98–111)
Creatinine, Ser: 3.37 mg/dL — ABNORMAL HIGH (ref 0.44–1.00)
GFR calc Af Amer: 14 mL/min — ABNORMAL LOW (ref >60)
GFR calc non Af Amer: 12 mL/min — ABNORMAL LOW (ref >60)
Glucose, Bld: 100 mg/dL — ABNORMAL HIGH (ref 70–99)
Potassium: 4.1 mmol/L (ref 3.5–5.1)
Sodium: 137 mmol/L (ref 135–145)
Total Bilirubin: 0.7 mg/dL (ref 0.3–1.2)
Total Protein: 6.8 g/dL (ref 6.5–8.1)

## 2018-12-29 LAB — CBC WITH DIFFERENTIAL/PLATELET
Abs Immature Granulocytes: 0.06 10*3/uL (ref 0.00–0.07)
Basophils Absolute: 0 10*3/uL (ref 0.0–0.1)
Basophils Relative: 0 %
Eosinophils Absolute: 0.1 10*3/uL (ref 0.0–0.5)
Eosinophils Relative: 1 %
HCT: 31.9 % — ABNORMAL LOW (ref 36.0–46.0)
Hemoglobin: 10.6 g/dL — ABNORMAL LOW (ref 12.0–15.0)
Immature Granulocytes: 1 %
Lymphocytes Relative: 9 %
Lymphs Abs: 0.8 10*3/uL (ref 0.7–4.0)
MCH: 30.2 pg (ref 26.0–34.0)
MCHC: 33.2 g/dL (ref 30.0–36.0)
MCV: 90.9 fL (ref 80.0–100.0)
Monocytes Absolute: 0.5 10*3/uL (ref 0.1–1.0)
Monocytes Relative: 5 %
Neutro Abs: 8.2 10*3/uL — ABNORMAL HIGH (ref 1.7–7.7)
Neutrophils Relative %: 84 %
Platelets: 196 10*3/uL (ref 150–400)
RBC: 3.51 MIL/uL — ABNORMAL LOW (ref 3.87–5.11)
RDW: 14.8 % (ref 11.5–15.5)
WBC: 9.7 10*3/uL (ref 4.0–10.5)
nRBC: 0 % (ref 0.0–0.2)

## 2018-12-29 LAB — URINALYSIS, COMPLETE (UACMP) WITH MICROSCOPIC
Bilirubin Urine: NEGATIVE
Glucose, UA: NEGATIVE mg/dL
Ketones, ur: NEGATIVE mg/dL
Nitrite: NEGATIVE
Protein, ur: NEGATIVE mg/dL
Specific Gravity, Urine: 1.009 (ref 1.005–1.030)
WBC, UA: >50 WBC/hpf — ABNORMAL HIGH (ref 0–5)
pH: 5 (ref 5.0–8.0)

## 2018-12-29 MED ORDER — MORPHINE SULFATE (PF) 2 MG/ML IV SOLN
2.0000 mg | Freq: Once | INTRAVENOUS | Status: AC
  Administered 2018-12-29: 2 mg via INTRAVENOUS
  Filled 2018-12-29: qty 1

## 2018-12-29 MED ORDER — LEVETIRACETAM 500 MG PO TABS: 500.0000 mg | ORAL_TABLET | ORAL | 1 refills | Status: DC

## 2018-12-29 MED ORDER — LEVETIRACETAM 500 MG PO TABS: 500.0000 mg | ORAL_TABLET | Freq: Two times a day (BID) | ORAL | 1 refills | Status: DC

## 2018-12-29 MED ORDER — HYDROCODONE-ACETAMINOPHEN 5-325 MG PO TABS
1.0000 | ORAL_TABLET | Freq: Once | ORAL | Status: AC
  Administered 2018-12-29: 1 via ORAL
  Filled 2018-12-29: qty 1

## 2018-12-29 MED ORDER — TETANUS-DIPHTH-ACELL PERTUSSIS 5-2.5-18.5 LF-MCG/0.5 IM SUSP
0.5000 mL | Freq: Once | INTRAMUSCULAR | Status: AC
  Administered 2018-12-29: 0.5 mL via INTRAMUSCULAR
  Filled 2018-12-29: qty 0.5

## 2018-12-29 MED ORDER — ONDANSETRON HCL 4 MG/2ML IJ SOLN
4.0000 mg | Freq: Once | INTRAMUSCULAR | Status: AC
  Administered 2018-12-29: 4 mg via INTRAVENOUS
  Filled 2018-12-29: qty 2

## 2018-12-29 MED ORDER — LEVETIRACETAM IN NACL 500 MG/100ML IV SOLN
500.0000 mg | Freq: Once | INTRAVENOUS | Status: AC
  Administered 2018-12-29: 500 mg via INTRAVENOUS
  Filled 2018-12-29: qty 100

## 2018-12-29 NOTE — ED Triage Notes (Signed)
Pt arrives via [facility] for mechanical fall after being discharged from this facility this morning. Pt reports she fell and hit her hear on the concrete and fell on her right side, states she was not dizzy prior to the event. Pt A&Ox4 and in NAD. Pt is c/o being sore on her ride side. Pt has a small cut on the left calf area, right elbow and bleeding is controlled to both. Pt also has a small hematoma above the right ear.

## 2018-12-29 NOTE — ED Notes (Signed)
Pt otf for further imaging

## 2018-12-29 NOTE — Discharge Instructions (Addendum)
Please return for any symptoms listed in the instructions.  And especially worsening headache, vomiting, or any weakness or clumsiness.  Please follow-up with Dr. Saintclair Halsted neurosurgery at [facility] I have included his contact information or you can call the outpatient clinic at [facility] if you wish their phone number is [phone].  Let them know you had a subdural hematoma and were discharged from the ER need follow-up.  Either should see you in about a week.  Please take Keppra 500 mg once a day to make sure you do not have any seizures after this head injury.

## 2018-12-29 NOTE — ED Notes (Signed)
Pt given warm blanket.

## 2018-12-29 NOTE — ED Notes (Signed)
Pt c/o right sided pain 8/10, Dr. Cinda Quest informed, see new orders

## 2018-12-29 NOTE — ED Notes (Signed)
[place], light green and blue tubes sent to lab as well as urine sample

## 2018-12-29 NOTE — ED Notes (Signed)
Pt otf for imaging 

## 2018-12-29 NOTE — ED Notes (Signed)
Pt asked for food but per Dr. Cinda Quest pt needs to wait until 2nd CT scan result, pt informed

## 2018-12-29 NOTE — ED Notes (Signed)
Report given to Dawn RN

## 2018-12-29 NOTE — ED Notes (Signed)
Patient resting quietly at this time.  Awaiting EMS to transport patient home.

## 2018-12-29 NOTE — ED Notes (Signed)
Pt assisted to bedpan to urinate.

## 2018-12-29 NOTE — ED Notes (Signed)
Pt otf for CT

## 2018-12-29 NOTE — ED Provider Notes (Signed)
[facility] Emergency Department Provider Note   ____________________________________________   First MD Initiated Contact with Patient 12/29/18 1346     (approximate)  I have reviewed the triage vital signs and the nursing notes.   HISTORY  Chief Complaint Fall    HPI Misty Berry is a 84 y.o. female who was going home from rehab today going up the curb tripped fell and hit her head.  She complains of some pain in the right side of the chest.  No loss of consciousness her chest hurts if she moves in a particular way.  She also has a skin tear on the right elbow.      Past Medical History:  Diagnosis Date  . Asthma   . Bruises easily   . Cellulitis   . Cough   . DDD (degenerative disc disease), lumbar   . GERD (gastroesophageal reflux disease)   . HBP (high blood pressure)   . Hypercholesterolemia   . Hyperparathyroidism ([place])   . Osteoarthritis   . Osteoporosis   . Rash   . Renal disorder    kidney disease  . Swelling     Patient Active Problem List   Diagnosis Date Noted  . Acid reflux 08/27/2015  . Cellulitis 08/27/2015  . Abnormal bruising 08/27/2015  . Arthritis, degenerative 08/27/2015  . OP (osteoporosis) 08/27/2015  . Presence of artificial eye 08/27/2015  . History of surgical procedure 08/27/2015  . Hypertensive heart disease with stage 4 chronic kidney disease ([place]) 04/30/2015  . Laceration of forearm without foreign body 04/30/2015  . Venous insufficiency of leg 02/28/2015  . Gluteal tendinitis 01/22/2015  . Degenerative arthritis of lumbar spine 12/12/2014  . Back muscle spasm 11/14/2014  . Degeneration of intervertebral disc of lumbar region 11/14/2014  . Neuritis or radiculitis due to rupture of lumbar intervertebral disc 11/14/2014  . Allergic rhinitis 08/31/2014  . Clinical depression 02/19/2014  . Gait instability 02/19/2014  . Benign hypertensive heart and kidney disease 10/31/2013  . Left heart failure  ([place]) 10/31/2013  . Branch retinal vein occlusion 09/11/2013  . Asthma 06/20/2013  . Essential hypertension 06/20/2013  . Follow-up examination 04/30/2013  . Acute renal failure ([place]) 02/16/2013  . Fall 02/16/2013  . Closed fracture of part of neck of femur ([place]) 02/16/2013  . Cellulitis and abscess of leg 10/30/2012  . Chronic kidney disease, stage III (moderate) ([place]) 10/30/2012  . Cellulitis of lower extremity 10/30/2012  . Chronic kidney disease, stage IV (severe) ([place]) 10/30/2012  . Central retinal edema, cystoid 01/31/2012  . Impaired vision 12/17/2011    Past Surgical History:  Procedure Laterality Date  . APPENDECTOMY    . BREAST REDUCTION SURGERY    . CHOLECYSTECTOMY    . EYE SURGERY Left   . FEMUR SURGERY    . FOOT SURGERY Right   . GALLBLADDER SURGERY    . LIPECTOMY    . PAROTID GLAND TUMOR EXCISION    . THYROID SURGERY    . TONSILLECTOMY    . TUBAL LIGATION    . VITRECTOMY      Prior to Admission medications   Medication Sig Start Date End Date Taking? Authorizing Provider  amLODipine (NORVASC) 2.5 MG tablet Take 2.5 mg by mouth daily.  12/13/17  Yes [provider]  aspirin EC 81 MG tablet Take 81 mg by mouth daily.    Yes [provider]  budesonide-formoterol (SYMBICORT) 160-4.5 MCG/ACT inhaler Inhale 1 puff into the lungs 2 (two) times daily.  Yes [provider]  calcium-vitamin D (OSCAL WITH D) 500-200 MG-UNIT TABS tablet Take 1 tablet by mouth daily.    Yes [provider]  clobetasol ointment (TEMOVATE) 1.61 % Apply 1 application topically 2 (two) times daily. To shins   Yes [provider]  esomeprazole (NEXIUM) 40 MG capsule Take 40 mg by mouth daily at 12 noon.   Yes [provider]  ferrous gluconate (FERGON) 324 MG tablet Take 324 mg by mouth daily.    Yes [provider]  L-Methylfolate-Algae (DEPLIN 15) 15-90.314 MG CAPS Take 15 mg by mouth daily.    Yes [provider]   L-Theanine 100 MG CAPS Take 200 mg by mouth 4 (four) times daily.   Yes [provider]  losartan (COZAAR) 25 MG tablet Take 50 mg by mouth daily.  06/18/15  Yes [provider]  metoprolol succinate (TOPROL-XL) 25 MG 24 hr tablet Take 12.5 mg by mouth daily.  06/17/15  Yes [provider]  MILK THISTLE PO Take 1 tablet by mouth daily.    Yes [provider]  Multiple Vitamins-Minerals (CENTRUM PO) Take 1 tablet by mouth daily.    Yes [provider]  Omega-3 Fatty Acids (FISH OIL) 1000 MG CAPS Take 1,000 mg by mouth 4 (four) times daily.    Yes [provider]  S-Adenosylmethionine (SAM-E) 200 MG TABS Take 200 mg by mouth 2 (two) times a day.   Yes [provider]  torsemide (DEMADEX) 20 MG tablet Take 20 mg by mouth daily.  09/16/17  Yes [provider]  vitamin B-12 (CYANOCOBALAMIN) 1000 MCG tablet Take 1,000 mcg by mouth daily.    Yes [provider]  ZOLOFT 100 MG tablet Take 200 mg by mouth daily.  06/07/15  Yes [provider]  levETIRAcetam (KEPPRA) 500 MG tablet Take 1 tablet (500 mg total) by mouth daily after supper. 12/29/18   Nena Polio, MD    Allergies Ivp dye [iodinated diagnostic agents], Lactose, Methylprednisolone, Other, Peanuts [peanut oil], Vancomycin, and Wellbutrin [bupropion]  History reviewed. No pertinent family history.  Social History Social History   Tobacco Use  . Smoking status: Never Smoker  . Smokeless tobacco: Never Used  Substance Use Topics  . Alcohol use: No  . Drug use: No    Review of Systems  Constitutional: No fever/chills Eyes: No visual changes. ENT: No sore throat. Cardiovascular: Denies chest pain. Respiratory: Denies shortness of breath. Gastrointestinal: No abdominal pain.  No nausea, no vomiting.  No diarrhea.  No constipation. Genitourinary: Negative for dysuria. Musculoskeletal: Negative for back pain. Skin: Negative for rash. Neurological:  Negative for headaches, focal weakness   ____________________________________________   PHYSICAL EXAM:  VITAL SIGNS: ED Triage Vitals  Enc Vitals Group     BP 12/29/18 1311 (!) 143/55     Pulse Rate 12/29/18 1311 68     Resp 12/29/18 1311 18     Temp 12/29/18 1311 99.2 F (37.3 C)     Temp Source 12/29/18 1311 Oral     SpO2 12/29/18 1311 97 %     Weight 12/29/18 1312 165 lb (74.8 kg)     Height 12/29/18 1312 5\' 2"  (1.575 m)     Head Circumference --      Peak Flow --      Pain Score 12/29/18 1311 1     Pain Loc --      Pain Edu? --      Excl. in [place]? --  Constitutional: Alert and oriented. Well appearing and in no acute distress. Eyes: Conjunctivae are normal.  patient's orbit on the left is little more contracted than the right side but patient says this is old.  There is restriction of the left eye movement as well.  Patient says that old is well. Head: Atraumatic. Nose: No congestion/rhinnorhea. Mouth/Throat: Mucous membranes are moist.  Oropharynx non-erythematous. Neck: No stridor. Cardiovascular: Normal rate, regular rhythm. Grossly normal heart sounds.  Good peripheral circulation. Respiratory: Normal respiratory effort.  No retractions. Lungs CTAB. Gastrointestinal: Soft and nontender. No distention. No abdominal bruits. No CVA tenderness. Musculoskeletal: No lower extremity tenderness nor edema.  There is a skin tear on the right elbow was noted Neurologic:  Normal speech and language. No new gross focal neurologic deficits are appreciated. Skin:  Skin is warm, dry and intact. No rash noted. Psychiatric: Mood and affect are normal. Speech and behavior are normal.  ____________________________________________   LABS (all labs ordered are listed, but only abnormal results are displayed)  Labs Reviewed  URINALYSIS, COMPLETE (UACMP) WITH MICROSCOPIC - Abnormal; Notable for the following components:      Result Value   Color, Urine STRAW (*)    APPearance HAZY  (*)    Hgb urine dipstick MODERATE (*)    Leukocytes,Ua MODERATE (*)    WBC, UA >50 (*)    Bacteria, UA RARE (*)    All other components within normal limits  COMPREHENSIVE METABOLIC PANEL - Abnormal; Notable for the following components:   Glucose, Bld 100 (*)    BUN 64 (*)    Creatinine, Ser 3.37 (*)    Albumin 3.4 (*)    GFR calc non Af Amer 12 (*)    GFR calc Af Amer 14 (*)    All other components within normal limits  CBC WITH DIFFERENTIAL/PLATELET - Abnormal; Notable for the following components:   RBC 3.51 (*)    Hemoglobin 10.6 (*)    HCT 31.9 (*)    Neutro Abs 8.2 (*)    All other components within normal limits   ____________________________________________  EKG  EKG read and interpreted by me shows sinus bradycardia rate of 59 normal axis nonspecific ST-T wave changes.  There is 1 PAC. ____________________________________________  RADIOLOGY  ED MD interpretation: Chest x-ray and rib films show right sixth rib fracture I reviewed the film CT shows acute on chronic subdural is very small I reviewed that as well.  Official radiology report(s): Dg Ribs Unilateral W/chest Right  Result Date: 12/29/2018 CLINICAL DATA:  [place] today with trauma to the right side. EXAM: RIGHT RIBS AND CHEST - 3+ VIEW COMPARISON:  12/11/2018 FINDINGS: Fracture of the right sixth rib laterally. No evidence of pneumothorax or hemothorax. Heart size upper limits of normal. Chronic aortic atherosclerosis. Mild chronic pulmonary scarring. IMPRESSION: Right sixth rib fracture laterally. Electronically Signed   By: Nelson Chimes M.D.   On: 12/29/2018 15:01   Ct Head Wo Contrast  Result Date: 12/29/2018 CLINICAL DATA:  84 year old who fell onto concrete on her RIGHT side earlier today. Small hematoma above the RIGHT ear on clinical evaluation. Initial encounter. EXAM: CT HEAD WITHOUT CONTRAST CT CERVICAL SPINE WITHOUT CONTRAST TECHNIQUE: Multidetector CT imaging of the head and cervical spine was  performed following the standard protocol without intravenous contrast. Multiplanar CT image reconstructions of the cervical spine were also generated. COMPARISON:  Head CT 02/04/2013. No prior cervical spine imaging. FINDINGS: CT HEAD FINDINGS Brain: Moderate to severe age related cortical and deep  atrophy. Severe changes of small vessel disease of the white matter. BILATERAL subdural fluid collections, with evidence of acute blood in the RIGHT subdural collection. Maximum thickness of the RIGHT subdural collection measures approximately 1.5 cm anteriorly and maximum thickness of the LEFT subdural collection measures approximately 1.1 cm anteriorly, measured on the coronal images. Mild effacement of cortical sulci in the frontal lobes, but no significant mass effect or midline shift. No parenchymal hemorrhage or hematoma. No evidence of intraventricular hemorrhage or subarachnoid hemorrhage. Vascular: Moderate BILATERAL carotid siphon and vertebral artery atherosclerosis. No hyperdense vessel. Skull: No skull fracture or other focal osseous abnormality involving the skull. Sinuses/Orbits: Benign osteoma involving a RIGHT MIDDLE LEFT moiety air cell. Visualized paranasal sinuses, BILATERAL mastoid air cells and BILATERAL middle ear cavities well-aerated. LEFT globe prosthesis. RIGHT orbit and globe unremarkable. Other: None. CT CERVICAL SPINE FINDINGS Alignment: Anatomic POSTERIOR alignment. Facet joints anatomically aligned throughout with diffuse degenerative changes. Skull base and vertebrae: Osseous demineralization. No fractures identified involving the cervical spine. Coronal reformatted images demonstrate an intact craniocervical junction, intact dens and intact lateral masses throughout. Soft tissues and spinal canal: No evidence of paraspinous or spinal canal hematoma. No evidence of spinal stenosis. Disc levels: Severe disc space narrowing at C5-6. Moderate disc space narrowing at C3-4. Combination of  facet and uncinate hypertrophy account for multilevel foraminal stenoses including moderate BILATERAL C3-4, mild RIGHT and severe LEFT C4-5, severe BILATERAL C5-6. Upper chest: Visualized lung apices clear. Atherosclerosis involving the proximal great vessels. Thyroid gland enlargement with scattered subcentimeter nodules and calcifications. No dominant nodules. Other: BILATERAL cervical carotid atherosclerosis, LEFT greater than RIGHT. IMPRESSION: 1. Bilateral subdural fluid collections, with evidence of acute blood in the RIGHT subdural collection; therefore, acute superimposed upon chronic RIGHT subdural hematoma and chronic LEFT subdural hematoma. 2. No significant mass effect or midline shift. 3. Moderate to severe generalized atrophy and severe chronic microvascular ischemic changes of the white matter. 4. No cervical spine fractures identified. 5. Osseous demineralization. 6. Multilevel degenerative disc disease, spondylosis and facet degenerative changes with multilevel foraminal stenoses as detailed above. I telephoned these results at the time of interpretation on 12/29/2018 at 3:32 pm to Dr. Conni Slipper, who verbally acknowledged these results. Electronically Signed   By: Evangeline Dakin M.D.   On: 12/29/2018 15:34   Ct Cervical Spine Wo Contrast  Result Date: 12/29/2018 CLINICAL DATA:  84 year old who fell onto concrete on her RIGHT side earlier today. Small hematoma above the RIGHT ear on clinical evaluation. Initial encounter. EXAM: CT HEAD WITHOUT CONTRAST CT CERVICAL SPINE WITHOUT CONTRAST TECHNIQUE: Multidetector CT imaging of the head and cervical spine was performed following the standard protocol without intravenous contrast. Multiplanar CT image reconstructions of the cervical spine were also generated. COMPARISON:  Head CT 02/04/2013. No prior cervical spine imaging. FINDINGS: CT HEAD FINDINGS Brain: Moderate to severe age related cortical and deep atrophy. Severe changes of small vessel  disease of the white matter. BILATERAL subdural fluid collections, with evidence of acute blood in the RIGHT subdural collection. Maximum thickness of the RIGHT subdural collection measures approximately 1.5 cm anteriorly and maximum thickness of the LEFT subdural collection measures approximately 1.1 cm anteriorly, measured on the coronal images. Mild effacement of cortical sulci in the frontal lobes, but no significant mass effect or midline shift. No parenchymal hemorrhage or hematoma. No evidence of intraventricular hemorrhage or subarachnoid hemorrhage. Vascular: Moderate BILATERAL carotid siphon and vertebral artery atherosclerosis. No hyperdense vessel. Skull: No skull fracture or other focal osseous abnormality  involving the skull. Sinuses/Orbits: Benign osteoma involving a RIGHT MIDDLE LEFT moiety air cell. Visualized paranasal sinuses, BILATERAL mastoid air cells and BILATERAL middle ear cavities well-aerated. LEFT globe prosthesis. RIGHT orbit and globe unremarkable. Other: None. CT CERVICAL SPINE FINDINGS Alignment: Anatomic POSTERIOR alignment. Facet joints anatomically aligned throughout with diffuse degenerative changes. Skull base and vertebrae: Osseous demineralization. No fractures identified involving the cervical spine. Coronal reformatted images demonstrate an intact craniocervical junction, intact dens and intact lateral masses throughout. Soft tissues and spinal canal: No evidence of paraspinous or spinal canal hematoma. No evidence of spinal stenosis. Disc levels: Severe disc space narrowing at C5-6. Moderate disc space narrowing at C3-4. Combination of facet and uncinate hypertrophy account for multilevel foraminal stenoses including moderate BILATERAL C3-4, mild RIGHT and severe LEFT C4-5, severe BILATERAL C5-6. Upper chest: Visualized lung apices clear. Atherosclerosis involving the proximal great vessels. Thyroid gland enlargement with scattered subcentimeter nodules and calcifications.  No dominant nodules. Other: BILATERAL cervical carotid atherosclerosis, LEFT greater than RIGHT. IMPRESSION: 1. Bilateral subdural fluid collections, with evidence of acute blood in the RIGHT subdural collection; therefore, acute superimposed upon chronic RIGHT subdural hematoma and chronic LEFT subdural hematoma. 2. No significant mass effect or midline shift. 3. Moderate to severe generalized atrophy and severe chronic microvascular ischemic changes of the white matter. 4. No cervical spine fractures identified. 5. Osseous demineralization. 6. Multilevel degenerative disc disease, spondylosis and facet degenerative changes with multilevel foraminal stenoses as detailed above. I telephoned these results at the time of interpretation on 12/29/2018 at 3:32 pm to Dr. Conni Slipper, who verbally acknowledged these results. Electronically Signed   By: Evangeline Dakin M.D.   On: 12/29/2018 15:34    ____________________________________________   PROCEDURES  Procedure(s) performed (including Critical Care):  Procedures   ____________________________________________   INITIAL IMPRESSION / ASSESSMENT AND PLAN / ED COURSE  Alicen Donalson Estrella was evaluated in Emergency Department on 12/29/2018 for the symptoms described in the history of present illness. She was evaluated in the context of the global COVID-19 pandemic, which necessitated consideration that the patient might be at risk for infection with the SARS-CoV-2 virus that causes COVID-19. Institutional protocols and algorithms that pertain to the evaluation of patients at risk for COVID-19 are in a state of rapid change based on information released by regulatory bodies including the CDC and federal and state organizations. These policies and algorithms were followed during the patient's care in the ED.     Patient waiting for second CT.  If CT shows worsening she will be admitted probably to call in her [facility].  If CT is not worse she can follow-up with  outpatient [facility] or with Dr. Saintclair Halsted at [facility].  Patient asked me to call [place] [facility].  I discussed the patient with both Dr. Saintclair Halsted at [facility] and also with the neurosurgeon at [facility].  Both gave me the above advice.  They wanted me to start her on Keppra as well.  Because of her renal function will start at 500 once a day.         ____________________________________________   FINAL CLINICAL IMPRESSION(S) / ED DIAGNOSES  Final diagnoses:  Fall, initial encounter  Subdural hematoma [facility])     ED Discharge Orders         Ordered    levETIRAcetam (KEPPRA) 500 MG tablet  2 times daily,   Status:  Discontinued     12/29/18 2119    levETIRAcetam (KEPPRA) 500 MG tablet  (Dosepack) After supper  12/29/18 2122           Note:  This document was prepared using Dragon voice recognition software and may include unintentional dictation errors.    Nena Polio, MD 12/29/18 2123

## 2018-12-29 NOTE — ED Notes (Signed)
Pt resting in bed using cell phone, will ctm

## 2018-12-29 NOTE — ED Notes (Signed)
Pt c/o right sided pain 4/10, notified Dr. Cinda Quest

## 2018-12-29 NOTE — ED Notes (Signed)
Pt's right arm dressed per pt request.

## 2019-02-06 DIAGNOSIS — R413 Other amnesia: Secondary | ICD-10-CM | POA: Insufficient documentation

## 2019-03-30 ENCOUNTER — Emergency Department
Admission: EM | Admit: 2019-03-30 | Discharge: 2019-03-30 | Disposition: A | Discharge status: Home / Self Care | Payer: Medicare Other | Attending: Emergency Medicine | Admitting: Emergency Medicine

## 2019-03-30 ENCOUNTER — Other Ambulatory Visit: Payer: Self-pay

## 2019-03-30 DIAGNOSIS — I130 Hypertensive heart and chronic kidney disease with heart failure and stage 1 through stage 4 chronic kidney disease, or unspecified chronic kidney disease: Secondary | ICD-10-CM | POA: Diagnosis not present

## 2019-03-30 DIAGNOSIS — Z7982 Long term (current) use of aspirin: Secondary | ICD-10-CM | POA: Diagnosis not present

## 2019-03-30 DIAGNOSIS — N184 Chronic kidney disease, stage 4 (severe): Secondary | ICD-10-CM | POA: Diagnosis not present

## 2019-03-30 DIAGNOSIS — Z79899 Other long term (current) drug therapy: Secondary | ICD-10-CM | POA: Diagnosis not present

## 2019-03-30 DIAGNOSIS — J45909 Unspecified asthma, uncomplicated: Secondary | ICD-10-CM | POA: Insufficient documentation

## 2019-03-30 DIAGNOSIS — I509 Heart failure, unspecified: Secondary | ICD-10-CM | POA: Diagnosis not present

## 2019-03-30 DIAGNOSIS — R040 Epistaxis: Secondary | ICD-10-CM

## 2019-03-30 DIAGNOSIS — Z91010 Allergy to peanuts: Secondary | ICD-10-CM | POA: Insufficient documentation

## 2019-03-30 MED ORDER — LABETALOL HCL 5 MG/ML IV SOLN
10.0000 mg | Freq: Once | INTRAVENOUS | Status: AC
  Administered 2019-03-30: 10 mg via INTRAVENOUS
  Filled 2019-03-30: qty 4

## 2019-03-30 MED ORDER — SILVER NITRATE-POT NITRATE 75-25 % EX MISC
1.0000 "application " | Freq: Once | CUTANEOUS | Status: AC
  Administered 2019-03-30: 1 via TOPICAL

## 2019-03-30 MED ORDER — SODIUM CHLORIDE 0.9 % IV SOLN
Freq: Once | INTRAVENOUS | Status: AC
  Administered 2019-03-30: 13:00:00 via INTRAVENOUS

## 2019-03-30 MED ORDER — OXYMETAZOLINE HCL 0.05 % NA SOLN
1.0000 | Freq: Once | NASAL | Status: AC
  Administered 2019-03-30: 1 via NASAL
  Filled 2019-03-30: qty 30

## 2019-03-30 MED ORDER — CEPHALEXIN 500 MG PO CAPS: 500.0000 mg | ORAL_CAPSULE | Freq: Three times a day (TID) | ORAL | 0 refills | Status: AC

## 2019-03-30 NOTE — ED Provider Notes (Signed)
[facility] Emergency Department Provider Note   ____________________________________________   First MD Initiated Contact with Patient 03/30/19 1221     (approximate)  I have reviewed the triage vital signs and the nursing notes.   HISTORY  Chief Complaint Epistaxis   HPI Misty Berry is a 84 y.o. female.  Reports she has not taken her medicines for a few days.  She says she forgets.  The person with her says she just does not take it because she does not want to.  At any rate patient had nosebleed and some bleed being down the back of her throat.  It resolved with nasal clamping but returned immediately when the clamp was released.  There is a small papule on the nasal septum on the left side that may have bled.  I cauterized this without any problem.  On the other side he was unable to stop the bleeding I could not see the source therefore I put in some Merocel.  The stopped bleeding nicely.  Patient tolerated all this very well.  Blood pressure came down with 10 of labetalol.  Patient will go home and resume taking her medicines.  I explained to her and she understands that this not taking medicine behavior could cause a stroke.         Past Medical History:  Diagnosis Date   Asthma    Bruises easily    Cellulitis    Cough    DDD (degenerative disc disease), lumbar    GERD (gastroesophageal reflux disease)    HBP (high blood pressure)    Hypercholesterolemia    Hyperparathyroidism (HCC)    Osteoarthritis    Osteoporosis    Rash    Renal disorder    kidney disease   Swelling     Patient Active Problem List   Diagnosis Date Noted   Acid reflux 08/27/2015   Cellulitis 08/27/2015   Abnormal bruising 08/27/2015   Arthritis, degenerative 08/27/2015   OP (osteoporosis) 08/27/2015   Presence of artificial eye 08/27/2015   History of surgical procedure 08/27/2015   Hypertensive heart disease with stage 4 chronic kidney  disease ([place]) 04/30/2015   Laceration of forearm without foreign body 04/30/2015   Venous insufficiency of leg 02/28/2015   Gluteal tendinitis 01/22/2015   Degenerative arthritis of lumbar spine 12/12/2014   Back muscle spasm 11/14/2014   Degeneration of intervertebral disc of lumbar region 11/14/2014   Neuritis or radiculitis due to rupture of lumbar intervertebral disc 11/14/2014   Allergic rhinitis 08/31/2014   Clinical depression 02/19/2014   Gait instability 02/19/2014   Benign hypertensive heart and kidney disease 10/31/2013   Left heart failure ([place]) 10/31/2013   Branch retinal vein occlusion 09/11/2013   Asthma 06/20/2013   Essential hypertension 06/20/2013   Follow-up examination 04/30/2013   Acute renal failure ([place]) 02/16/2013   Fall 02/16/2013   Closed fracture of part of neck of femur ([place]) 02/16/2013   Cellulitis and abscess of leg 10/30/2012   Chronic kidney disease, stage III (moderate) 10/30/2012   Cellulitis of lower extremity 10/30/2012   Chronic kidney disease, stage IV (severe) ([place]) 10/30/2012   Central retinal edema, cystoid 01/31/2012   Impaired vision 12/17/2011    Past Surgical History:  Procedure Laterality Date   APPENDECTOMY     BREAST REDUCTION SURGERY     CHOLECYSTECTOMY     EYE SURGERY Left    FEMUR SURGERY     FOOT SURGERY Right    GALLBLADDER SURGERY  LIPECTOMY     PAROTID GLAND TUMOR EXCISION     THYROID SURGERY     TONSILLECTOMY     TUBAL LIGATION     VITRECTOMY      Prior to Admission medications   Medication Sig Start Date End Date Taking? Authorizing Provider  amLODipine (NORVASC) 2.5 MG tablet Take 2.5 mg by mouth daily.  12/13/17   [provider]  aspirin EC 81 MG tablet Take 81 mg by mouth daily.     [provider]  budesonide-formoterol (SYMBICORT) 160-4.5 MCG/ACT inhaler Inhale 1 puff into the lungs 2 (two) times daily.     [provider]  calcium-vitamin  D (OSCAL WITH D) 500-200 MG-UNIT TABS tablet Take 1 tablet by mouth daily.     [provider]  cephALEXin (KEFLEX) 500 MG capsule Take 1 capsule (500 mg total) by mouth 3 (three) times daily for 10 days. 03/30/19 04/09/19  Nena Polio, MD  clobetasol ointment (TEMOVATE) [id number] % Apply 1 application topically 2 (two) times daily. To shins    [provider]  esomeprazole (NEXIUM) 40 MG capsule Take 40 mg by mouth daily at 12 noon.    [provider]  ferrous gluconate (FERGON) 324 MG tablet Take 324 mg by mouth daily.     [provider]  L-Methylfolate-Algae (DEPLIN 15) 15-90.314 MG CAPS Take 15 mg by mouth daily.     [provider]  L-Theanine 100 MG CAPS Take 200 mg by mouth 4 (four) times daily.    [provider]  levETIRAcetam (KEPPRA) 500 MG tablet Take 1 tablet (500 mg total) by mouth daily after supper. 12/29/18   Nena Polio, MD  losartan (COZAAR) 25 MG tablet Take 50 mg by mouth daily.  06/18/15   [provider]  metoprolol succinate (TOPROL-XL) 25 MG 24 hr tablet Take 12.5 mg by mouth daily.  06/17/15   [provider]  MILK THISTLE PO Take 1 tablet by mouth daily.     [provider]  Multiple Vitamins-Minerals (CENTRUM PO) Take 1 tablet by mouth daily.     [provider]  Omega-3 Fatty Acids (FISH OIL) 1000 MG CAPS Take 1,000 mg by mouth 4 (four) times daily.     [provider]  S-Adenosylmethionine (SAM-E) 200 MG TABS Take 200 mg by mouth 2 (two) times a day.    [provider]  torsemide (DEMADEX) 20 MG tablet Take 20 mg by mouth daily.  09/16/17   [provider]  vitamin B-12 (CYANOCOBALAMIN) 1000 MCG tablet Take 1,000 mcg by mouth daily.     [provider]  ZOLOFT 100 MG tablet Take 200 mg by mouth daily.  06/07/15   [provider]    Allergies Ivp dye [iodinated diagnostic agents], Lactose, Methylprednisolone, Other, Peanuts [peanut oil],  Vancomycin, and Wellbutrin [bupropion]  No family history on file.  Social History Social History   Tobacco Use   Smoking status: Never Smoker   Smokeless tobacco: Never Used  Substance Use Topics   Alcohol use: No   Drug use: No    Review of Systems  Constitutional: No fever/chills Eyes: No visual changes. ENT: No sore throat. Cardiovascular: Denies chest pain. Respiratory: Denies shortness of breath. Gastrointestinal: No abdominal pain.  No nausea, no vomiting.  No diarrhea.  No constipation. Genitourinary: Negative for dysuria. Musculoskeletal: Negative for back pain. Skin: Negative for rash. Neurological: Negative for headaches, focal weakness   ____________________________________________   PHYSICAL EXAM:  VITAL SIGNS: ED Triage Vitals [03/30/19 1106]  Enc Vitals Group     BP (!) 171/86     Pulse Rate 82     Resp 18     Temp 97.9 F (36.6 C)     Temp Source Oral     SpO2 100 %     Weight 170 lb (77.1 kg)     Height 5\' 5"  (1.651 m)     Head Circumference      Peak Flow      Pain Score 0     Pain Loc      Pain Edu?      Excl. in [place]?     Constitutional: Alert and oriented. Well appearing and in no acute distress. Eyes: Conjunctivae are normal. Head: Atraumatic. Nose: No congestion/rhinnorhea.  Right nostril is full of blood Mouth/Throat: Mucous membranes are moist.  Oropharynx non-erythematous. Neck: No stridor.  Cardiovascular: Normal rate, regular rhythm. Good peripheral circulation. Respiratory: Normal respiratory effort.  No retractions.  Gastrointestinal: Soft and nontender. No distention. No abdominal bruits.. Musculoskeletal: No lower extremity tenderness nor edema.   Neurologic:  Normal speech and language. No gross focal neurologic deficits are appreciated. . Skin:  Skin is warm, dry and intact. No rash noted.   ____________________________________________   LABS (all labs ordered are listed, but only abnormal results are  displayed)  Labs Reviewed - No data to display ____________________________________________  EKG   ____________________________________________  RADIOLOGY  ED MD interpretation:   Official radiology report(s): No results found.  ____________________________________________   PROCEDURES  Procedure(s) performed (including Critical Care):  Procedures patient is nose packed with Merocel as noted she will follow-up with Dr. Pryor Ochoa and return for any further problems   ____________________________________________   INITIAL IMPRESSION / ASSESSMENT AND PLAN / ED COURSE  Misty Berry was evaluated in Emergency Department on 03/30/2019 for the symptoms described in the history of present illness. She was evaluated in the context of the global COVID-19 pandemic, which necessitated consideration that the patient might be at risk for infection with the SARS-CoV-2 virus that causes COVID-19. Institutional protocols and algorithms that pertain to the evaluation of patients at risk for COVID-19 are in a state of rapid change based on information released by regulatory bodies including the CDC and federal and state organizations. These policies and algorithms were followed during the patient's care in the ED.    Patient will go home and take her blood pressure medication          ____________________________________________   FINAL CLINICAL IMPRESSION(S) / ED DIAGNOSES  Final diagnoses:  Epistaxis     ED Discharge Orders         Ordered    cephALEXin (KEFLEX) 500 MG capsule  3 times daily     03/30/19 1331           Note:  This document was prepared using Dragon voice recognition software and may include unintentional dictation errors.    Nena Polio, MD 03/30/19 [phone]

## 2019-03-30 NOTE — ED Triage Notes (Signed)
Family with pt at this time.  Intermittent confusion since head bleed X 2 over last 8-10 months.

## 2019-03-30 NOTE — ED Triage Notes (Addendum)
Pt to ER c/o nose bleed that started approx 10AM upon awakening. Pt family states pt has been off of her BP meds X 1 month and reportedly had high BP with EMS. Pt denies headache or pain at this time. Alert and oriented X 4. Pt good historian, states that she fell and hit her head X 2 in last 8 months causing both of her head bleeds. Pt arrives with nose clamp in place.   Clots to right side of nose, no active bleeding when clamp removed prior to spray given. Reclamped. Pt does not take blood thinners.

## 2019-03-30 NOTE — ED Notes (Signed)
ED Provider at bedside. 

## 2019-03-30 NOTE — ED Notes (Signed)

## 2019-03-30 NOTE — ED Triage Notes (Signed)
FIRST NURSE NOTE-pt with spontaneous nose bleed.  No blood thinners.  Arrived EMS, HR 84.  Nose actively bleeding. Pt alert and NAD

## 2019-03-30 NOTE — Discharge Instructions (Addendum)
Please take the Keflex antibiotic 1 pill 3 times a day.  Please leave the sponge in your nose.  Dr. Marella Bile the ear nose and throat doctor will pull it out.  I have included his name and phone number on the discharge instructions.  Please return here for fever pain or more bleeding.  Please be sure to take all of your medicines as directed.  Remember not taking them collegial blood pressure to go up higher and cause your nose to bleed or even cause a stroke.

## 2019-04-02 ENCOUNTER — Encounter: Payer: Self-pay | Admitting: Emergency Medicine

## 2019-04-02 ENCOUNTER — Emergency Department
Admission: EM | Admit: 2019-04-02 | Discharge: 2019-04-02 | Disposition: A | Discharge status: Home / Self Care | Payer: Medicare Other | Attending: Emergency Medicine | Admitting: Emergency Medicine

## 2019-04-02 ENCOUNTER — Other Ambulatory Visit: Payer: Self-pay

## 2019-04-02 DIAGNOSIS — R040 Epistaxis: Secondary | ICD-10-CM | POA: Diagnosis present

## 2019-04-02 DIAGNOSIS — N184 Chronic kidney disease, stage 4 (severe): Secondary | ICD-10-CM | POA: Diagnosis not present

## 2019-04-02 DIAGNOSIS — Z79899 Other long term (current) drug therapy: Secondary | ICD-10-CM | POA: Diagnosis not present

## 2019-04-02 DIAGNOSIS — I129 Hypertensive chronic kidney disease with stage 1 through stage 4 chronic kidney disease, or unspecified chronic kidney disease: Secondary | ICD-10-CM | POA: Diagnosis not present

## 2019-04-02 DIAGNOSIS — Z7982 Long term (current) use of aspirin: Secondary | ICD-10-CM | POA: Diagnosis not present

## 2019-04-02 LAB — CBC
HCT: 30.1 % — ABNORMAL LOW (ref 36.0–46.0)
Hemoglobin: 9.6 g/dL — ABNORMAL LOW (ref 12.0–15.0)
MCH: 30.2 pg (ref 26.0–34.0)
MCHC: 31.9 g/dL (ref 30.0–36.0)
MCV: 94.7 fL (ref 80.0–100.0)
Platelets: 215 10*3/uL (ref 150–400)
RBC: 3.18 MIL/uL — ABNORMAL LOW (ref 3.87–5.11)
RDW: 14.1 % (ref 11.5–15.5)
WBC: 5.4 10*3/uL (ref 4.0–10.5)
nRBC: 0 % (ref 0.0–0.2)

## 2019-04-02 MED ORDER — OXYMETAZOLINE HCL 0.05 % NA SOLN
1.0000 | Freq: Once | NASAL | Status: AC
  Administered 2019-04-02: 1 via NASAL
  Filled 2019-04-02: qty 30

## 2019-04-02 NOTE — Discharge Instructions (Signed)
We have stopped the slow bleeding from the left nostril. Avoid blowing your nose. Keep your appointment with ENT for tomorrow. Return to the ED as needed.

## 2019-04-02 NOTE — ED Provider Notes (Signed)
[facility] Emergency Department Provider Note ____________________________________________  Time seen: 1408  I have reviewed the triage vital signs and the nursing notes.  HISTORY  Chief Complaint  Epistaxis  HPI Misty Berry is a 84 y.o. female returns to the ED for concern over intermittent epistaxis despite having her right nostril packed 3 days prior.  Patient has been compliant and not removed the packing.  She does admit, however, to blowing her nose in the antrum which she admits has preceded her nose bleached and every time.  She describes trying her best to gingerly blow the nose as to not cause any disruption, but she continues to note some slow drainage from primarily the left nostril but she denies any nausea, vomiting, or dizziness.  She has secured an appointment with ENT for tomorrow afternoon.  Past Medical History:  Diagnosis Date  . Asthma   . Bruises easily   . Cellulitis   . Cough   . DDD (degenerative disc disease), lumbar   . GERD (gastroesophageal reflux disease)   . HBP (high blood pressure)   . Hypercholesterolemia   . Hyperparathyroidism ([place])   . Osteoarthritis   . Osteoporosis   . Rash   . Renal disorder    kidney disease  . Swelling     Patient Active Problem List   Diagnosis Date Noted  . Acid reflux 08/27/2015  . Cellulitis 08/27/2015  . Abnormal bruising 08/27/2015  . Arthritis, degenerative 08/27/2015  . OP (osteoporosis) 08/27/2015  . Presence of artificial eye 08/27/2015  . History of surgical procedure 08/27/2015  . Hypertensive heart disease with stage 4 chronic kidney disease ([place]) 04/30/2015  . Laceration of forearm without foreign body 04/30/2015  . Venous insufficiency of leg 02/28/2015  . Gluteal tendinitis 01/22/2015  . Degenerative arthritis of lumbar spine 12/12/2014  . Back muscle spasm 11/14/2014  . Degeneration of intervertebral disc of lumbar region 11/14/2014  . Neuritis or radiculitis due to  rupture of lumbar intervertebral disc 11/14/2014  . Allergic rhinitis 08/31/2014  . Clinical depression 02/19/2014  . Gait instability 02/19/2014  . Benign hypertensive heart and kidney disease 10/31/2013  . Left heart failure ([place]) 10/31/2013  . Branch retinal vein occlusion 09/11/2013  . Asthma 06/20/2013  . Essential hypertension 06/20/2013  . Follow-up examination 04/30/2013  . Acute renal failure ([place]) 02/16/2013  . Fall 02/16/2013  . Closed fracture of part of neck of femur ([place]) 02/16/2013  . Cellulitis and abscess of leg 10/30/2012  . Chronic kidney disease, stage III (moderate) 10/30/2012  . Cellulitis of lower extremity 10/30/2012  . Chronic kidney disease, stage IV (severe) ([place]) 10/30/2012  . Central retinal edema, cystoid 01/31/2012  . Impaired vision 12/17/2011    Past Surgical History:  Procedure Laterality Date  . APPENDECTOMY    . BREAST REDUCTION SURGERY    . CHOLECYSTECTOMY    . EYE SURGERY Left   . FEMUR SURGERY    . FOOT SURGERY Right   . GALLBLADDER SURGERY    . LIPECTOMY    . PAROTID GLAND TUMOR EXCISION    . THYROID SURGERY    . TONSILLECTOMY    . TUBAL LIGATION    . VITRECTOMY      Prior to Admission medications   Medication Sig Start Date End Date Taking? Authorizing Provider  amLODipine (NORVASC) 2.5 MG tablet Take 2.5 mg by mouth daily.  12/13/17   [provider]  aspirin EC 81 MG tablet Take 81 mg by mouth daily.  [provider]  budesonide-formoterol (SYMBICORT) 160-4.5 MCG/ACT inhaler Inhale 1 puff into the lungs 2 (two) times daily.     [provider]  calcium-vitamin D (OSCAL WITH D) 500-200 MG-UNIT TABS tablet Take 1 tablet by mouth daily.     [provider]  cephALEXin (KEFLEX) 500 MG capsule Take 1 capsule (500 mg total) by mouth 3 (three) times daily for 10 days. 03/30/19 04/09/19  Nena Polio, MD  clobetasol ointment (TEMOVATE) [id number] % Apply 1 application topically 2 (two) times daily. To  shins    [provider]  esomeprazole (NEXIUM) 40 MG capsule Take 40 mg by mouth daily at 12 noon.    [provider]  ferrous gluconate (FERGON) 324 MG tablet Take 324 mg by mouth daily.     [provider]  L-Methylfolate-Algae (DEPLIN 15) 15-90.314 MG CAPS Take 15 mg by mouth daily.     [provider]  L-Theanine 100 MG CAPS Take 200 mg by mouth 4 (four) times daily.    [provider]  levETIRAcetam (KEPPRA) 500 MG tablet Take 1 tablet (500 mg total) by mouth daily after supper. 12/29/18   Nena Polio, MD  losartan (COZAAR) 25 MG tablet Take 50 mg by mouth daily.  06/18/15   [provider]  metoprolol succinate (TOPROL-XL) 25 MG 24 hr tablet Take 12.5 mg by mouth daily.  06/17/15   [provider]  MILK THISTLE PO Take 1 tablet by mouth daily.     [provider]  Multiple Vitamins-Minerals (CENTRUM PO) Take 1 tablet by mouth daily.     [provider]  Omega-3 Fatty Acids (FISH OIL) 1000 MG CAPS Take 1,000 mg by mouth 4 (four) times daily.     [provider]  S-Adenosylmethionine (SAM-E) 200 MG TABS Take 200 mg by mouth 2 (two) times a day.    [provider]  torsemide (DEMADEX) 20 MG tablet Take 20 mg by mouth daily.  09/16/17   [provider]  vitamin B-12 (CYANOCOBALAMIN) 1000 MCG tablet Take 1,000 mcg by mouth daily.     [provider]  ZOLOFT 100 MG tablet Take 200 mg by mouth daily.  06/07/15   [provider]    Allergies Ivp dye [iodinated diagnostic agents], Lactose, Methylprednisolone, Other, Peanuts [peanut oil], Vancomycin, and Wellbutrin [bupropion]  History reviewed. No pertinent family history.  Social History Social History   Tobacco Use  . Smoking status: Never Smoker  . Smokeless tobacco: Never Used  Substance Use Topics  . Alcohol use: No  . Drug use: No    Review of Systems  Constitutional: Negative for fever. Eyes: Negative for  visual changes. ENT: Negative for sore throat.  Intermittent nosebleed as above. Cardiovascular: Negative for chest pain. Respiratory: Negative for shortness of breath. Gastrointestinal: Negative for abdominal pain, vomiting and diarrhea. Musculoskeletal: Negative for back pain. Skin: Negative for rash. Neurological: Negative for headaches, focal weakness or numbness. ____________________________________________  PHYSICAL EXAM:  VITAL SIGNS: ED Triage Vitals  Enc Vitals Group     BP 04/02/19 1218 (!) 165/92     Pulse Rate 04/02/19 1218 77     Resp 04/02/19 1218 16     Temp 04/02/19 1218 (!) 97.5 F (36.4 C)     Temp Source 04/02/19 1218 Oral     SpO2 04/02/19 1218 100 %     Weight 04/02/19 1219 180 lb (81.6 kg)     Height 04/02/19 1219 5\' 4"  (1.626  m)     Head Circumference --      Peak Flow --      Pain Score 04/02/19 1219 0     Pain Loc --      Pain Edu? --      Excl. in [place]? --     Constitutional: Alert and oriented. Well appearing and in no distress. Head: Normocephalic and atraumatic. Eyes: Conjunctivae are normal. Normal extraocular movements Nose: No congestion/rhinorrhea right nostril with Merocel packing in place.  The left nostril was noted to have a previously cauterized lesion to the septum.  There is slow, fresh blood running from the area.  There is a fresh clot to the anterior vestibule. Mouth/Throat: Mucous membranes are moist. Cardiovascular: Normal rate, regular rhythm. Normal distal pulses. Respiratory: Normal respiratory effort. No wheezes/rales/rhonchi. Gastrointestinal: Soft and nontender. No distention. Musculoskeletal: Nontender with normal range of motion in all extremities.  Neurologic:  Normal gait without ataxia. Normal speech and language. No gross focal neurologic deficits are appreciated. Skin:  Skin is warm, dry and intact. No rash noted. ____________________________________________  PROCEDURES  oxymetazoline 0.05% nasal spray ii sprays  left nostril Procedures ____________________________________________  INITIAL IMPRESSION / ASSESSMENT AND PLAN / ED COURSE  Patient with ED evaluation of intermittent bleeding from the nostril status post Merocel packing.  Patient admits to attempted to blow the nose, which precedes the intermittent bleeding.  She is advised to not blow the nose until she is evaluated by ENT.  The slow bleeding noted to the left nostril, was controlled with topical application of Afrin.  Patient will follow up with ENT tomorrow as scheduled.  She is discharged with Afrin to use topically every 4-6 hours as needed for persistent nosebleeding.  She will follow-up to the ED if needed.  Sherly Revelo Fromme was evaluated in Emergency Department on 04/02/2019 for the symptoms described in the history of present illness. She was evaluated in the context of the global COVID-19 pandemic, which necessitated consideration that the patient might be at risk for infection with the SARS-CoV-2 virus that causes COVID-19. Institutional protocols and algorithms that pertain to the evaluation of patients at risk for COVID-19 are in a state of rapid change based on information released by regulatory bodies including the CDC and federal and state organizations. These policies and algorithms were followed during the patient's care in the ED. ____________________________________________  FINAL CLINICAL IMPRESSION(S) / ED DIAGNOSES  Final diagnoses:  Left-sided epistaxis      Carmie End, Dannielle Karvonen, PA-C 04/02/19 1536    Harvest Dark, MD 04/03/19 2120

## 2019-04-02 NOTE — ED Notes (Signed)
Pt reports that she had more bleeding on Saturday and Sunday, with some bleeding this morning as well. She called the referred provider for an appointment, but they "didn't seem overwhelmed at the prospect" and didn't give her an appointment until tomorrow, so she came in to see what else can be done. Pt alert & oriented, NAD noted.

## 2019-04-02 NOTE — ED Triage Notes (Signed)
Pt to ED from home c/o epistaxis x3 days, was seen here and discharged, has appointment for tomorrow with referral but has continued to have intermittent nose bleeds.  A&Ox4, skin color appropriate, gauze in place upon arrival.

## 2019-05-06 ENCOUNTER — Emergency Department
Admission: EM | Admit: 2019-05-06 | Discharge: 2019-05-06 | Disposition: A | Discharge status: Home / Self Care | Payer: Medicare Other | Attending: Student in an Organized Health Care Education/Training Program | Admitting: Student in an Organized Health Care Education/Training Program

## 2019-05-06 ENCOUNTER — Encounter: Payer: Self-pay | Admitting: *Deleted

## 2019-05-06 ENCOUNTER — Other Ambulatory Visit: Payer: Self-pay

## 2019-05-06 DIAGNOSIS — N183 Chronic kidney disease, stage 3 unspecified: Secondary | ICD-10-CM | POA: Insufficient documentation

## 2019-05-06 DIAGNOSIS — Z7982 Long term (current) use of aspirin: Secondary | ICD-10-CM | POA: Diagnosis not present

## 2019-05-06 DIAGNOSIS — I509 Heart failure, unspecified: Secondary | ICD-10-CM | POA: Diagnosis not present

## 2019-05-06 DIAGNOSIS — R040 Epistaxis: Secondary | ICD-10-CM

## 2019-05-06 DIAGNOSIS — I130 Hypertensive heart and chronic kidney disease with heart failure and stage 1 through stage 4 chronic kidney disease, or unspecified chronic kidney disease: Secondary | ICD-10-CM | POA: Diagnosis not present

## 2019-05-06 DIAGNOSIS — Z91010 Allergy to peanuts: Secondary | ICD-10-CM | POA: Insufficient documentation

## 2019-05-06 DIAGNOSIS — Z79899 Other long term (current) drug therapy: Secondary | ICD-10-CM | POA: Insufficient documentation

## 2019-05-06 MED ORDER — LIDOCAINE-EPINEPHRINE 2 %-1:100000 IJ SOLN
30.0000 mL | Freq: Once | INTRAMUSCULAR | Status: AC
  Administered 2019-05-06: 15:00:00 30 mL
  Filled 2019-05-06: qty 2

## 2019-05-06 MED ORDER — SILVER NITRATE-POT NITRATE 75-25 % EX MISC
1.0000 | Freq: Once | CUTANEOUS | Status: AC
  Administered 2019-05-06: 1 via TOPICAL
  Filled 2019-05-06: qty 10

## 2019-05-06 MED ORDER — AMOXICILLIN-POT CLAVULANATE 875-125 MG PO TABS: 1.0000 | ORAL_TABLET | Freq: Two times a day (BID) | ORAL | 0 refills | Status: AC

## 2019-05-06 NOTE — ED Notes (Signed)
Patient refused discharge vital signs. 

## 2019-05-06 NOTE — ED Triage Notes (Signed)
Per EMS report, patient has had several nosebleeds over the last several days. Patient had one today that occurred before she took her B/P meds. Patient took her meds and nose bleed resolved. Initial b/P for EMT was 200's/ 100, 2nd pressure was 139/78. Pt received one spray of afrin in both nares and a nose clip. No active bleeding upon arrival.

## 2019-05-06 NOTE — ED Provider Notes (Signed)
[facility] Emergency Department Provider Note    First MD Initiated Contact with Patient 05/06/19 1336     (approximate)  I have reviewed the triage vital signs and the nursing notes.   HISTORY  Chief Complaint Epistaxis    HPI Misty Berry is a 84 y.o. female the below listed past medical history presents to the ER for evaluation of recurrent left anterior epistaxis.  Recently he was seen had anterior nasal packing but did not do well with this or decided that she wanted it to be removed this was pulled out after only 24 hours.  Denies any other symptoms.  Not currently on any anticoagulation.    Past Medical History:  Diagnosis Date  . Asthma   . Bruises easily   . Cellulitis   . Cough   . DDD (degenerative disc disease), lumbar   . GERD (gastroesophageal reflux disease)   . HBP (high blood pressure)   . Hypercholesterolemia   . Hyperparathyroidism ([place])   . Osteoarthritis   . Osteoporosis   . Rash   . Renal disorder    kidney disease  . Swelling    No family history on file. Past Surgical History:  Procedure Laterality Date  . APPENDECTOMY    . BREAST REDUCTION SURGERY    . CHOLECYSTECTOMY    . EYE SURGERY Left   . FEMUR SURGERY    . FOOT SURGERY Right   . GALLBLADDER SURGERY    . LIPECTOMY    . PAROTID GLAND TUMOR EXCISION    . THYROID SURGERY    . TONSILLECTOMY    . TUBAL LIGATION    . VITRECTOMY     Patient Active Problem List   Diagnosis Date Noted  . Acid reflux 08/27/2015  . Cellulitis 08/27/2015  . Abnormal bruising 08/27/2015  . Arthritis, degenerative 08/27/2015  . OP (osteoporosis) 08/27/2015  . Presence of artificial eye 08/27/2015  . History of surgical procedure 08/27/2015  . Hypertensive heart disease with stage 4 chronic kidney disease ([place]) 04/30/2015  . Laceration of forearm without foreign body 04/30/2015  . Venous insufficiency of leg 02/28/2015  . Gluteal tendinitis 01/22/2015  . Degenerative  arthritis of lumbar spine 12/12/2014  . Back muscle spasm 11/14/2014  . Degeneration of intervertebral disc of lumbar region 11/14/2014  . Neuritis or radiculitis due to rupture of lumbar intervertebral disc 11/14/2014  . Allergic rhinitis 08/31/2014  . Clinical depression 02/19/2014  . Gait instability 02/19/2014  . Benign hypertensive heart and kidney disease 10/31/2013  . Left heart failure ([place]) 10/31/2013  . Branch retinal vein occlusion 09/11/2013  . Asthma 06/20/2013  . Essential hypertension 06/20/2013  . Follow-up examination 04/30/2013  . Acute renal failure ([place]) 02/16/2013  . Fall 02/16/2013  . Closed fracture of part of neck of femur ([place]) 02/16/2013  . Cellulitis and abscess of leg 10/30/2012  . Chronic kidney disease, stage III (moderate) 10/30/2012  . Cellulitis of lower extremity 10/30/2012  . Chronic kidney disease, stage IV (severe) ([place]) 10/30/2012  . Central retinal edema, cystoid 01/31/2012  . Impaired vision 12/17/2011      Prior to Admission medications   Medication Sig Start Date End Date Taking? Authorizing Provider  amLODipine (NORVASC) 2.5 MG tablet Take 2.5 mg by mouth daily.  12/13/17   [provider]  amoxicillin-clavulanate (AUGMENTIN) 875-125 MG tablet Take 1 tablet by mouth 2 (two) times daily for 7 days. 05/06/19 05/13/19  Merlyn Lot, MD  aspirin EC 81 MG tablet  Take 81 mg by mouth daily.     [provider]  budesonide-formoterol (SYMBICORT) 160-4.5 MCG/ACT inhaler Inhale 1 puff into the lungs 2 (two) times daily.     [provider]  calcium-vitamin D (OSCAL WITH D) 500-200 MG-UNIT TABS tablet Take 1 tablet by mouth daily.     [provider]  clobetasol ointment (TEMOVATE) AB-123456789 % Apply 1 application topically 2 (two) times daily. To shins    [provider]  esomeprazole (NEXIUM) 40 MG capsule Take 40 mg by mouth daily at 12 noon.    [provider]  ferrous gluconate (FERGON) 324 MG  tablet Take 324 mg by mouth daily.     [provider]  L-Methylfolate-Algae (DEPLIN 15) 15-90.314 MG CAPS Take 15 mg by mouth daily.     [provider]  L-Theanine 100 MG CAPS Take 200 mg by mouth 4 (four) times daily.    [provider]  levETIRAcetam (KEPPRA) 500 MG tablet Take 1 tablet (500 mg total) by mouth daily after supper. 12/29/18   Nena Polio, MD  losartan (COZAAR) 25 MG tablet Take 50 mg by mouth daily.  06/18/15   [provider]  metoprolol succinate (TOPROL-XL) 25 MG 24 hr tablet Take 12.5 mg by mouth daily.  06/17/15   [provider]  MILK THISTLE PO Take 1 tablet by mouth daily.     [provider]  Multiple Vitamins-Minerals (CENTRUM PO) Take 1 tablet by mouth daily.     [provider]  Omega-3 Fatty Acids (FISH OIL) 1000 MG CAPS Take 1,000 mg by mouth 4 (four) times daily.     [provider]  S-Adenosylmethionine (SAM-E) 200 MG TABS Take 200 mg by mouth 2 (two) times a day.    [provider]  torsemide (DEMADEX) 20 MG tablet Take 20 mg by mouth daily.  09/16/17   [provider]  vitamin B-12 (CYANOCOBALAMIN) 1000 MCG tablet Take 1,000 mcg by mouth daily.     [provider]  ZOLOFT 100 MG tablet Take 200 mg by mouth daily.  06/07/15   [provider]    Allergies Ivp dye [iodinated diagnostic agents], Lactose, Methylprednisolone, Other, Peanuts [peanut oil], Vancomycin, and Wellbutrin [bupropion]    Social History Social History   Tobacco Use  . Smoking status: Never Smoker  . Smokeless tobacco: Never Used  Substance Use Topics  . Alcohol use: No  . Drug use: No    Review of Systems Patient denies headaches, rhinorrhea, blurry vision, numbness, shortness of breath, chest pain, edema, cough, abdominal pain, nausea, vomiting, diarrhea, dysuria, fevers, rashes or hallucinations unless otherwise stated above in HPI.  ____________________________________________   PHYSICAL EXAM:  VITAL SIGNS: Vitals:   05/06/19 1400 05/06/19 1430  BP: (!) 178/71 (!) 170/103  Pulse: 72 71  Resp:    Temp:    SpO2: 99% 98%    Constitutional: Alert and oriented.  Eyes: Conjunctivae are normal.  Head: Atraumatic. Nose: No congestion/rhinnorhea. Mouth/Throat: Mucous membranes left anterior nasal polyp that is with small pulsatile ooze.  No evidence of posterior bleed. Neck: No stridor. Painless ROM.  Cardiovascular: Normal rate, regular rhythm. Grossly normal heart sounds.  Good peripheral circulation. Respiratory: Normal respiratory effort.  No retractions. Lungs CTAB. Gastrointestinal: Soft and nontender. No distention. No abdominal bruits. No CVA tenderness. Genitourinary:  Musculoskeletal: No lower extremity tenderness nor edema.  No joint effusions. Neurologic:  Normal speech and language. No gross focal neurologic deficits are appreciated.  No facial droop Skin:  Skin is warm, dry and intact. No rash noted. Psychiatric: Mood and affect are normal. Speech and behavior are normal.  ____________________________________________   LABS (all labs ordered are listed, but only abnormal results are displayed)  No results found for this or any previous visit (from the past 24 hour(s)). ____________________________________________ ____________________________________________  [id number]   ____________________________________________   PROCEDURES  Procedure(s) performed:  .Epistaxis Management  Date/Time: 05/06/2019 3:31 PM Performed by: Merlyn Lot, MD Authorized by: Merlyn Lot, MD   Consent:    Consent obtained:  Verbal   Consent given by:  Patient   Risks discussed:  Bleeding, infection, nasal injury and pain   Alternatives discussed:  Observation, alternative treatment, referral and delayed treatment Anesthesia (see MAR for exact dosages):    Anesthesia method:  Topical application    Topical anesthetic:  Lidocaine gel Procedure details:    Treatment site:  L anterior   Treatment method:  Merocel sponge   Treatment episode: recurring   Post-procedure details:    Assessment:  Bleeding stopped   Patient tolerance of procedure:  Tolerated well, no immediate complications      Critical Care performed: no ____________________________________________   INITIAL IMPRESSION / ASSESSMENT AND PLAN / ED COURSE  Pertinent labs & imaging results that were available during my care of the patient were reviewed by me and considered in my medical decision making (see chart for details).   DDX: Epistaxis, trauma, AVM  Julanne Bertolet Lichtenberg is a 84 y.o. who presents to the ED with evidence of left anterior epistaxis manage as above.  Bleeding controlled.  No signs or symptoms of symptomatic anemia.  Patient has follow-up with ENT.  Discussed signs and symptoms which should return to the ER.     The patient was evaluated in Emergency Department today for the symptoms described in the history of present illness. He/she was evaluated in the context of the global COVID-19 pandemic, which necessitated consideration that the patient might be at risk for infection with the SARS-CoV-2 virus that causes COVID-19. Institutional protocols and algorithms that pertain to the evaluation of patients at risk for COVID-19 are in a state of rapid change based on information released by regulatory bodies including the CDC and federal and state organizations. These policies and algorithms were followed during the patient's care in the ED.  As part of my medical decision making, I reviewed the following data within the [place] notes reviewed and incorporated, Labs reviewed, notes from prior ED visits and [place] Controlled Substance Database   ____________________________________________   FINAL CLINICAL IMPRESSION(S) / ED DIAGNOSES  Final diagnoses:  Left-sided epistaxis       NEW MEDICATIONS STARTED DURING THIS VISIT:  New Prescriptions   AMOXICILLIN-CLAVULANATE (AUGMENTIN) 875-125 MG TABLET    Take 1 tablet by mouth 2 (two) times daily for 7 days.     Note:  This document was prepared using Dragon voice recognition software and may include unintentional dictation errors.    Merlyn Lot, MD 05/06/19 [phone]

## 2019-05-06 NOTE — ED Notes (Signed)
Assisted pt to the bathroom and back to the stretcher.

## 2019-06-26 ENCOUNTER — Emergency Department: Payer: Medicare Other

## 2019-06-26 ENCOUNTER — Other Ambulatory Visit: Payer: Self-pay

## 2019-06-26 ENCOUNTER — Inpatient Hospital Stay
Admission: EM | Admit: 2019-06-26 | Discharge: 2019-07-02 | DRG: 291 | Disposition: A | Discharge status: Skilled Nursing Facility | Payer: Medicare Other | Attending: Family Medicine | Admitting: Family Medicine

## 2019-06-26 DIAGNOSIS — Z91041 Radiographic dye allergy status: Secondary | ICD-10-CM

## 2019-06-26 DIAGNOSIS — Z881 Allergy status to other antibiotic agents status: Secondary | ICD-10-CM

## 2019-06-26 DIAGNOSIS — I959 Hypotension, unspecified: Secondary | ICD-10-CM | POA: Diagnosis present

## 2019-06-26 DIAGNOSIS — Z91010 Allergy to peanuts: Secondary | ICD-10-CM

## 2019-06-26 DIAGNOSIS — R0781 Pleurodynia: Secondary | ICD-10-CM

## 2019-06-26 DIAGNOSIS — E213 Hyperparathyroidism, unspecified: Secondary | ICD-10-CM | POA: Diagnosis present

## 2019-06-26 DIAGNOSIS — J189 Pneumonia, unspecified organism: Secondary | ICD-10-CM

## 2019-06-26 DIAGNOSIS — M810 Age-related osteoporosis without current pathological fracture: Secondary | ICD-10-CM | POA: Diagnosis present

## 2019-06-26 DIAGNOSIS — J9811 Atelectasis: Secondary | ICD-10-CM | POA: Diagnosis present

## 2019-06-26 DIAGNOSIS — F0390 Unspecified dementia without behavioral disturbance: Secondary | ICD-10-CM | POA: Diagnosis present

## 2019-06-26 DIAGNOSIS — H5462 Unqualified visual loss, left eye, normal vision right eye: Secondary | ICD-10-CM | POA: Diagnosis present

## 2019-06-26 DIAGNOSIS — F05 Delirium due to known physiological condition: Secondary | ICD-10-CM | POA: Diagnosis present

## 2019-06-26 DIAGNOSIS — Z20822 Contact with and (suspected) exposure to covid-19: Secondary | ICD-10-CM | POA: Diagnosis present

## 2019-06-26 DIAGNOSIS — Z7982 Long term (current) use of aspirin: Secondary | ICD-10-CM

## 2019-06-26 DIAGNOSIS — Z79899 Other long term (current) drug therapy: Secondary | ICD-10-CM

## 2019-06-26 DIAGNOSIS — Z888 Allergy status to other drugs, medicaments and biological substances status: Secondary | ICD-10-CM

## 2019-06-26 DIAGNOSIS — J45909 Unspecified asthma, uncomplicated: Secondary | ICD-10-CM | POA: Diagnosis present

## 2019-06-26 DIAGNOSIS — I10 Essential (primary) hypertension: Secondary | ICD-10-CM | POA: Diagnosis present

## 2019-06-26 DIAGNOSIS — D631 Anemia in chronic kidney disease: Secondary | ICD-10-CM | POA: Diagnosis present

## 2019-06-26 DIAGNOSIS — I248 Other forms of acute ischemic heart disease: Secondary | ICD-10-CM | POA: Diagnosis present

## 2019-06-26 DIAGNOSIS — T50916A Underdosing of multiple unspecified drugs, medicaments and biological substances, initial encounter: Secondary | ICD-10-CM | POA: Diagnosis present

## 2019-06-26 DIAGNOSIS — G9341 Metabolic encephalopathy: Secondary | ICD-10-CM | POA: Diagnosis present

## 2019-06-26 DIAGNOSIS — I7090 Unspecified atherosclerosis: Secondary | ICD-10-CM | POA: Diagnosis present

## 2019-06-26 DIAGNOSIS — Z8249 Family history of ischemic heart disease and other diseases of the circulatory system: Secondary | ICD-10-CM

## 2019-06-26 DIAGNOSIS — R0902 Hypoxemia: Secondary | ICD-10-CM | POA: Diagnosis present

## 2019-06-26 DIAGNOSIS — M5136 Other intervertebral disc degeneration, lumbar region: Secondary | ICD-10-CM | POA: Diagnosis present

## 2019-06-26 DIAGNOSIS — Z9114 Patient's other noncompliance with medication regimen: Secondary | ICD-10-CM

## 2019-06-26 DIAGNOSIS — R0600 Dyspnea, unspecified: Secondary | ICD-10-CM

## 2019-06-26 DIAGNOSIS — I428 Other cardiomyopathies: Secondary | ICD-10-CM | POA: Diagnosis present

## 2019-06-26 DIAGNOSIS — M1990 Unspecified osteoarthritis, unspecified site: Secondary | ICD-10-CM | POA: Diagnosis present

## 2019-06-26 DIAGNOSIS — F419 Anxiety disorder, unspecified: Secondary | ICD-10-CM | POA: Diagnosis present

## 2019-06-26 DIAGNOSIS — I130 Hypertensive heart and chronic kidney disease with heart failure and stage 1 through stage 4 chronic kidney disease, or unspecified chronic kidney disease: Principal | ICD-10-CM | POA: Diagnosis present

## 2019-06-26 DIAGNOSIS — N185 Chronic kidney disease, stage 5: Secondary | ICD-10-CM | POA: Diagnosis present

## 2019-06-26 DIAGNOSIS — N186 End stage renal disease: Secondary | ICD-10-CM | POA: Diagnosis present

## 2019-06-26 DIAGNOSIS — E785 Hyperlipidemia, unspecified: Secondary | ICD-10-CM | POA: Diagnosis present

## 2019-06-26 DIAGNOSIS — Z7951 Long term (current) use of inhaled steroids: Secondary | ICD-10-CM

## 2019-06-26 DIAGNOSIS — I5023 Acute on chronic systolic (congestive) heart failure: Secondary | ICD-10-CM | POA: Diagnosis present

## 2019-06-26 DIAGNOSIS — K219 Gastro-esophageal reflux disease without esophagitis: Secondary | ICD-10-CM | POA: Diagnosis present

## 2019-06-26 DIAGNOSIS — Y92009 Unspecified place in unspecified non-institutional (private) residence as the place of occurrence of the external cause: Secondary | ICD-10-CM

## 2019-06-26 DIAGNOSIS — Z91128 Patient's intentional underdosing of medication regimen for other reason: Secondary | ICD-10-CM

## 2019-06-26 DIAGNOSIS — I1310 Hypertensive heart and chronic kidney disease without heart failure, with stage 1 through stage 4 chronic kidney disease, or unspecified chronic kidney disease: Secondary | ICD-10-CM | POA: Diagnosis present

## 2019-06-26 DIAGNOSIS — F329 Major depressive disorder, single episode, unspecified: Secondary | ICD-10-CM | POA: Diagnosis present

## 2019-06-26 DIAGNOSIS — N179 Acute kidney failure, unspecified: Secondary | ICD-10-CM | POA: Diagnosis present

## 2019-06-26 DIAGNOSIS — J9601 Acute respiratory failure with hypoxia: Secondary | ICD-10-CM | POA: Diagnosis present

## 2019-06-26 DIAGNOSIS — I5043 Acute on chronic combined systolic (congestive) and diastolic (congestive) heart failure: Secondary | ICD-10-CM | POA: Diagnosis present

## 2019-06-26 LAB — COMPREHENSIVE METABOLIC PANEL
ALT: 14 U/L (ref 0–44)
AST: 28 U/L (ref 15–41)
Albumin: 3.7 g/dL (ref 3.5–5.0)
Alkaline Phosphatase: 69 U/L (ref 38–126)
Anion gap: 9 (ref 5–15)
BUN: 47 mg/dL — ABNORMAL HIGH (ref 8–23)
CO2: 20 mmol/L — ABNORMAL LOW (ref 22–32)
Calcium: 9 mg/dL (ref 8.9–10.3)
Chloride: 112 mmol/L — ABNORMAL HIGH (ref 98–111)
Creatinine, Ser: 2.95 mg/dL — ABNORMAL HIGH (ref 0.44–1.00)
GFR calc Af Amer: 16 mL/min — ABNORMAL LOW (ref >60)
GFR calc non Af Amer: 14 mL/min — ABNORMAL LOW (ref >60)
Glucose, Bld: 101 mg/dL — ABNORMAL HIGH (ref 70–99)
Potassium: 5 mmol/L (ref 3.5–5.1)
Sodium: 141 mmol/L (ref 135–145)
Total Bilirubin: 1.1 mg/dL (ref 0.3–1.2)
Total Protein: 7.1 g/dL (ref 6.5–8.1)

## 2019-06-26 LAB — CBC
HCT: 30.4 % — ABNORMAL LOW (ref 36.0–46.0)
Hemoglobin: 9.2 g/dL — ABNORMAL LOW (ref 12.0–15.0)
MCH: 27.6 pg (ref 26.0–34.0)
MCHC: 30.3 g/dL (ref 30.0–36.0)
MCV: 91.3 fL (ref 80.0–100.0)
Platelets: 201 10*3/uL (ref 150–400)
RBC: 3.33 MIL/uL — ABNORMAL LOW (ref 3.87–5.11)
RDW: 16.1 % — ABNORMAL HIGH (ref 11.5–15.5)
WBC: 7 10*3/uL (ref 4.0–10.5)
nRBC: 0 % (ref 0.0–0.2)

## 2019-06-26 LAB — POC SARS CORONAVIRUS 2 AG -  ED: SARS Coronavirus 2 Ag: NEGATIVE

## 2019-06-26 LAB — BRAIN NATRIURETIC PEPTIDE: B Natriuretic Peptide: 1392 pg/mL — ABNORMAL HIGH (ref 0.0–100.0)

## 2019-06-26 LAB — TROPONIN I (HIGH SENSITIVITY): Troponin I (High Sensitivity): 36 ng/L — ABNORMAL HIGH (ref <18)

## 2019-06-26 MED ORDER — FUROSEMIDE 10 MG/ML IJ SOLN
60.0000 mg | Freq: Once | INTRAMUSCULAR | Status: AC
  Administered 2019-06-27: 60 mg via INTRAVENOUS
  Filled 2019-06-26: qty 8

## 2019-06-26 MED ORDER — SODIUM CHLORIDE 0.9 % IV SOLN
500.0000 mg | Freq: Once | INTRAVENOUS | Status: AC
  Administered 2019-06-27: 02:00:00 500 mg via INTRAVENOUS
  Filled 2019-06-26: qty 500

## 2019-06-26 MED ORDER — SODIUM CHLORIDE 0.9 % IV SOLN
1.0000 g | Freq: Once | INTRAVENOUS | Status: AC
  Administered 2019-06-27: 01:00:00 1 g via INTRAVENOUS
  Filled 2019-06-26: qty 10

## 2019-06-26 NOTE — ED Triage Notes (Signed)
Pt to ED via [facility] from home. Pt c/o SOB at rest and with exertion. Pt placed on 6L [place] via EMS. EMS states pt is not med compliant per pt's friend that lives with her. Pt w/ hx afib, HTN, TBI.   Upon arrival pt w/ auditory wheezes and labored breathing. Pt 95% on RA.

## 2019-06-26 NOTE — ED Provider Notes (Addendum)
[facility] Emergency Department Provider Note  Time seen: 10:35 PM  I have reviewed the triage vital signs and the nursing notes.   HISTORY  Chief Complaint Shortness of Breath   HPI Misty Berry is a 85 y.o. female with a past medical history of asthma, gastric reflux, hypertension, hyperlipidemia, CKD, presents to the emergency department for shortness of breath.  According to the patient beginning earlier today she began feeling short of breath which has progressively worsened.  Patient denies any fever cough congestion or shortness of breath.  Denies any history of COPD or CHF.  No smoking history.  No home O2 requirement.  Denies any nausea vomiting diarrhea or recent illness.  Patient does have mild audible rhonchi during my evaluation, with deep breaths and somewhat tachypneic.   Past Medical History:  Diagnosis Date  . Asthma   . Bruises easily   . Cellulitis   . Cough   . DDD (degenerative disc disease), lumbar   . GERD (gastroesophageal reflux disease)   . HBP (high blood pressure)   . Hypercholesterolemia   . Hyperparathyroidism ([place])   . Osteoarthritis   . Osteoporosis   . Rash   . Renal disorder    kidney disease  . Swelling     Patient Active Problem List   Diagnosis Date Noted  . Acid reflux 08/27/2015  . Cellulitis 08/27/2015  . Abnormal bruising 08/27/2015  . Arthritis, degenerative 08/27/2015  . OP (osteoporosis) 08/27/2015  . Presence of artificial eye 08/27/2015  . History of surgical procedure 08/27/2015  . Hypertensive heart disease with stage 4 chronic kidney disease ([place]) 04/30/2015  . Laceration of forearm without foreign body 04/30/2015  . Venous insufficiency of leg 02/28/2015  . Gluteal tendinitis 01/22/2015  . Degenerative arthritis of lumbar spine 12/12/2014  . Back muscle spasm 11/14/2014  . Degeneration of intervertebral disc of lumbar region 11/14/2014  . Neuritis or radiculitis due to rupture of lumbar  intervertebral disc 11/14/2014  . Allergic rhinitis 08/31/2014  . Clinical depression 02/19/2014  . Gait instability 02/19/2014  . Benign hypertensive heart and kidney disease 10/31/2013  . Left heart failure ([place]) 10/31/2013  . Branch retinal vein occlusion 09/11/2013  . Asthma 06/20/2013  . Essential hypertension 06/20/2013  . Follow-up examination 04/30/2013  . Acute renal failure ([place]) 02/16/2013  . Fall 02/16/2013  . Closed fracture of part of neck of femur ([place]) 02/16/2013  . Cellulitis and abscess of leg 10/30/2012  . Chronic kidney disease, stage III (moderate) 10/30/2012  . Cellulitis of lower extremity 10/30/2012  . Chronic kidney disease, stage IV (severe) ([place]) 10/30/2012  . Central retinal edema, cystoid 01/31/2012  . Impaired vision 12/17/2011    Past Surgical History:  Procedure Laterality Date  . APPENDECTOMY    . BREAST REDUCTION SURGERY    . CHOLECYSTECTOMY    . EYE SURGERY Left   . FEMUR SURGERY    . FOOT SURGERY Right   . GALLBLADDER SURGERY    . LIPECTOMY    . PAROTID GLAND TUMOR EXCISION    . THYROID SURGERY    . TONSILLECTOMY    . TUBAL LIGATION    . VITRECTOMY      Prior to Admission medications   Medication Sig Start Date End Date Taking? Authorizing Provider  amLODipine (NORVASC) 2.5 MG tablet Take 2.5 mg by mouth daily.  12/13/17   [provider]  aspirin EC 81 MG tablet Take 81 mg by mouth daily.  [provider]  budesonide-formoterol (SYMBICORT) 160-4.5 MCG/ACT inhaler Inhale 1 puff into the lungs 2 (two) times daily.     [provider]  calcium-vitamin D (OSCAL WITH D) 500-200 MG-UNIT TABS tablet Take 1 tablet by mouth daily.     [provider]  clobetasol ointment (TEMOVATE) AB-123456789 % Apply 1 application topically 2 (two) times daily. To shins    [provider]  esomeprazole (NEXIUM) 40 MG capsule Take 40 mg by mouth daily at 12 noon.    [provider]  ferrous gluconate (FERGON) 324  MG tablet Take 324 mg by mouth daily.     [provider]  L-Methylfolate-Algae (DEPLIN 15) 15-90.314 MG CAPS Take 15 mg by mouth daily.     [provider]  L-Theanine 100 MG CAPS Take 200 mg by mouth 4 (four) times daily.    [provider]  levETIRAcetam (KEPPRA) 500 MG tablet Take 1 tablet (500 mg total) by mouth daily after supper. 12/29/18   Nena Polio, MD  losartan (COZAAR) 25 MG tablet Take 50 mg by mouth daily.  06/18/15   [provider]  metoprolol succinate (TOPROL-XL) 25 MG 24 hr tablet Take 12.5 mg by mouth daily.  06/17/15   [provider]  MILK THISTLE PO Take 1 tablet by mouth daily.     [provider]  Multiple Vitamins-Minerals (CENTRUM PO) Take 1 tablet by mouth daily.     [provider]  Omega-3 Fatty Acids (FISH OIL) 1000 MG CAPS Take 1,000 mg by mouth 4 (four) times daily.     [provider]  S-Adenosylmethionine (SAM-E) 200 MG TABS Take 200 mg by mouth 2 (two) times a day.    [provider]  torsemide (DEMADEX) 20 MG tablet Take 20 mg by mouth daily.  09/16/17   [provider]  vitamin B-12 (CYANOCOBALAMIN) 1000 MCG tablet Take 1,000 mcg by mouth daily.     [provider]  ZOLOFT 100 MG tablet Take 200 mg by mouth daily.  06/07/15   [provider]    Allergies  Allergen Reactions  . Ivp Dye [Iodinated Diagnostic Agents] Other (See Comments)  . Lactose   . Methylprednisolone     Other reaction(s): Hallucination, Other (See Comments) Psychosis  . Other Other (See Comments)    Swelling and vomitting  . Peanuts [Peanut Oil]     Swelling and vomitting  . Vancomycin   . Wellbutrin [Bupropion] Other (See Comments)    Hallucinations    No family history on file.  Social History Social History   Tobacco Use  . Smoking status: Never Smoker  . Smokeless tobacco: Never Used  Substance Use Topics  . Alcohol use: No  . Drug use: No    Review of  Systems Constitutional: Negative for fever Cardiovascular: Negative for chest pain. Respiratory: Positive for shortness of breath.  Negative for cough. Gastrointestinal: Negative for abdominal pain, vomiting  Musculoskeletal: Negative for musculoskeletal complaints Neurological: Negative for headache All other ROS negative  ____________________________________________   PHYSICAL EXAM:  Constitutional: Patient is awake alert oriented, no real distress but she is tachypneic. Eyes: Normal exam ENT      Head: Normocephalic and atraumatic.      Mouth/Throat: Mucous membranes are moist. Cardiovascular: Normal rate, regular rhythm.  Respiratory: Mild tachypnea with somewhat increased work of breathing.  Mild audible rhonchi bilaterally.  No wheeze or rales. Gastrointestinal: Soft and nontender. No distention.   Musculoskeletal: Nontender with normal  range of motion in all extremities.  Neurologic:  Normal speech and language. No gross focal neurologic deficits Skin:  Skin is warm, dry and intact.  Psychiatric: Mood and affect are normal.   ____________________________________________    EKG  EKG shows sinus tachycardia with 121 bpm with a narrow QRS, normal axis, normal intervals nonspecific ST changes  ____________________________________________    RADIOLOGY  Chest x-ray shows patchy airspace opacities  ____________________________________________   INITIAL IMPRESSION / ASSESSMENT AND PLAN / ED COURSE  Pertinent labs & imaging results that were available during my care of the patient were reviewed by me and considered in my medical decision making (see chart for details).   Patient presents to the emergency department for shortness of breath starting earlier today and progressively worsening.  Differential would include pneumonia, CHF/pulmonary edema, Covid, asthma.  We will check labs, Covid swab, chest x-ray and continue to closely monitor.  Patient currently satting  around 93% on room air.  X-ray shows patchy airspace opacities, white blood cell count is normal.  Concerning for possible Covid.  Covid test pending.  Rapid Covid is negative but x-ray appears consistent with possible Covid.  BNP is elevated Merry Proud would be on the differential as well.  Patient appears to take torsemide at baseline although denies any heart failure history.  Given these findings and slight troponin elevation patient will be admitted to the hospital.  We will dose IV Lasix cover with antibiotics check blood cultures while awaiting Covid PCR.  Gladene Isham Heberlein was evaluated in Emergency Department on 06/26/2019 for the symptoms described in the history of present illness. She was evaluated in the context of the global COVID-19 pandemic, which necessitated consideration that the patient might be at risk for infection with the SARS-CoV-2 virus that causes COVID-19. Institutional protocols and algorithms that pertain to the evaluation of patients at risk for COVID-19 are in a state of rapid change based on information released by regulatory bodies including the CDC and federal and state organizations. These policies and algorithms were followed during the patient's care in the ED.  ____________________________________________   FINAL CLINICAL IMPRESSION(S) / ED DIAGNOSES  Dyspnea Pneumonia CHF   Harvest Dark, MD 06/26/19 [id number]    Harvest Dark, MD 06/26/19 2348

## 2019-06-27 ENCOUNTER — Observation Stay (HOSPITAL_BASED_OUTPATIENT_CLINIC_OR_DEPARTMENT_OTHER)
Admit: 2019-06-27 | Discharge: 2019-06-27 | Disposition: A | Discharge status: Home / Self Care | Payer: Medicare Other | Attending: Internal Medicine | Admitting: Internal Medicine

## 2019-06-27 ENCOUNTER — Encounter: Payer: Self-pay | Admitting: Internal Medicine

## 2019-06-27 ENCOUNTER — Observation Stay: Payer: Medicare Other

## 2019-06-27 DIAGNOSIS — G9341 Metabolic encephalopathy: Secondary | ICD-10-CM | POA: Diagnosis present

## 2019-06-27 DIAGNOSIS — K219 Gastro-esophageal reflux disease without esophagitis: Secondary | ICD-10-CM | POA: Diagnosis present

## 2019-06-27 DIAGNOSIS — R0902 Hypoxemia: Secondary | ICD-10-CM

## 2019-06-27 DIAGNOSIS — I7090 Unspecified atherosclerosis: Secondary | ICD-10-CM | POA: Diagnosis present

## 2019-06-27 DIAGNOSIS — I248 Other forms of acute ischemic heart disease: Secondary | ICD-10-CM | POA: Diagnosis present

## 2019-06-27 DIAGNOSIS — Z20822 Contact with and (suspected) exposure to covid-19: Secondary | ICD-10-CM | POA: Diagnosis present

## 2019-06-27 DIAGNOSIS — H5462 Unqualified visual loss, left eye, normal vision right eye: Secondary | ICD-10-CM | POA: Diagnosis present

## 2019-06-27 DIAGNOSIS — F05 Delirium due to known physiological condition: Secondary | ICD-10-CM | POA: Diagnosis present

## 2019-06-27 DIAGNOSIS — I5023 Acute on chronic systolic (congestive) heart failure: Secondary | ICD-10-CM

## 2019-06-27 DIAGNOSIS — N179 Acute kidney failure, unspecified: Secondary | ICD-10-CM | POA: Diagnosis present

## 2019-06-27 DIAGNOSIS — F419 Anxiety disorder, unspecified: Secondary | ICD-10-CM | POA: Diagnosis present

## 2019-06-27 DIAGNOSIS — E213 Hyperparathyroidism, unspecified: Secondary | ICD-10-CM | POA: Diagnosis present

## 2019-06-27 DIAGNOSIS — M810 Age-related osteoporosis without current pathological fracture: Secondary | ICD-10-CM | POA: Diagnosis present

## 2019-06-27 DIAGNOSIS — J9811 Atelectasis: Secondary | ICD-10-CM | POA: Diagnosis present

## 2019-06-27 DIAGNOSIS — D631 Anemia in chronic kidney disease: Secondary | ICD-10-CM | POA: Diagnosis present

## 2019-06-27 DIAGNOSIS — J45909 Unspecified asthma, uncomplicated: Secondary | ICD-10-CM | POA: Diagnosis present

## 2019-06-27 DIAGNOSIS — E785 Hyperlipidemia, unspecified: Secondary | ICD-10-CM | POA: Diagnosis present

## 2019-06-27 DIAGNOSIS — N184 Chronic kidney disease, stage 4 (severe): Secondary | ICD-10-CM | POA: Diagnosis not present

## 2019-06-27 DIAGNOSIS — R0609 Other forms of dyspnea: Secondary | ICD-10-CM | POA: Diagnosis not present

## 2019-06-27 DIAGNOSIS — I130 Hypertensive heart and chronic kidney disease with heart failure and stage 1 through stage 4 chronic kidney disease, or unspecified chronic kidney disease: Secondary | ICD-10-CM | POA: Diagnosis present

## 2019-06-27 DIAGNOSIS — I428 Other cardiomyopathies: Secondary | ICD-10-CM | POA: Diagnosis present

## 2019-06-27 DIAGNOSIS — I5043 Acute on chronic combined systolic (congestive) and diastolic (congestive) heart failure: Secondary | ICD-10-CM | POA: Diagnosis present

## 2019-06-27 DIAGNOSIS — J9601 Acute respiratory failure with hypoxia: Secondary | ICD-10-CM | POA: Diagnosis present

## 2019-06-27 DIAGNOSIS — N185 Chronic kidney disease, stage 5: Secondary | ICD-10-CM | POA: Diagnosis present

## 2019-06-27 DIAGNOSIS — F329 Major depressive disorder, single episode, unspecified: Secondary | ICD-10-CM | POA: Diagnosis present

## 2019-06-27 DIAGNOSIS — F0390 Unspecified dementia without behavioral disturbance: Secondary | ICD-10-CM | POA: Diagnosis present

## 2019-06-27 DIAGNOSIS — I959 Hypotension, unspecified: Secondary | ICD-10-CM | POA: Diagnosis present

## 2019-06-27 DIAGNOSIS — M5136 Other intervertebral disc degeneration, lumbar region: Secondary | ICD-10-CM | POA: Diagnosis present

## 2019-06-27 DIAGNOSIS — Y92009 Unspecified place in unspecified non-institutional (private) residence as the place of occurrence of the external cause: Secondary | ICD-10-CM | POA: Diagnosis not present

## 2019-06-27 DIAGNOSIS — R0600 Dyspnea, unspecified: Secondary | ICD-10-CM | POA: Diagnosis not present

## 2019-06-27 LAB — URINALYSIS, ROUTINE W REFLEX MICROSCOPIC
Bacteria, UA: NONE SEEN
Bilirubin Urine: NEGATIVE
Glucose, UA: NEGATIVE mg/dL
Hgb urine dipstick: NEGATIVE
Ketones, ur: NEGATIVE mg/dL
Leukocytes,Ua: NEGATIVE
Nitrite: NEGATIVE
Protein, ur: 30 mg/dL — AB
Specific Gravity, Urine: 1.008 (ref 1.005–1.030)
Squamous Epithelial / HPF: NONE SEEN (ref 0–5)
pH: 6 (ref 5.0–8.0)

## 2019-06-27 LAB — STREP PNEUMONIAE URINARY ANTIGEN: Strep Pneumo Urinary Antigen: NEGATIVE

## 2019-06-27 LAB — COMPREHENSIVE METABOLIC PANEL
ALT: 12 U/L (ref 0–44)
AST: 16 U/L (ref 15–41)
Albumin: 3.3 g/dL — ABNORMAL LOW (ref 3.5–5.0)
Alkaline Phosphatase: 61 U/L (ref 38–126)
Anion gap: 7 (ref 5–15)
BUN: 49 mg/dL — ABNORMAL HIGH (ref 8–23)
CO2: 22 mmol/L (ref 22–32)
Calcium: 9 mg/dL (ref 8.9–10.3)
Chloride: 115 mmol/L — ABNORMAL HIGH (ref 98–111)
Creatinine, Ser: 3.03 mg/dL — ABNORMAL HIGH (ref 0.44–1.00)
GFR calc Af Amer: 16 mL/min — ABNORMAL LOW (ref >60)
GFR calc non Af Amer: 14 mL/min — ABNORMAL LOW (ref >60)
Glucose, Bld: 108 mg/dL — ABNORMAL HIGH (ref 70–99)
Potassium: 4.8 mmol/L (ref 3.5–5.1)
Sodium: 144 mmol/L (ref 135–145)
Total Bilirubin: 0.8 mg/dL (ref 0.3–1.2)
Total Protein: 6.6 g/dL (ref 6.5–8.1)

## 2019-06-27 LAB — ECHOCARDIOGRAM COMPLETE
Height: 64 in
Weight: 2720 oz

## 2019-06-27 LAB — CBC
HCT: 25.6 % — ABNORMAL LOW (ref 36.0–46.0)
Hemoglobin: 8 g/dL — ABNORMAL LOW (ref 12.0–15.0)
MCH: 28.3 pg (ref 26.0–34.0)
MCHC: 31.3 g/dL (ref 30.0–36.0)
MCV: 90.5 fL (ref 80.0–100.0)
Platelets: 183 10*3/uL (ref 150–400)
RBC: 2.83 MIL/uL — ABNORMAL LOW (ref 3.87–5.11)
RDW: 15.9 % — ABNORMAL HIGH (ref 11.5–15.5)
WBC: 7 10*3/uL (ref 4.0–10.5)
nRBC: 0 % (ref 0.0–0.2)

## 2019-06-27 LAB — TROPONIN I (HIGH SENSITIVITY)
Troponin I (High Sensitivity): 45 ng/L — ABNORMAL HIGH (ref <18)
Troponin I (High Sensitivity): 53 ng/L — ABNORMAL HIGH (ref <18)
Troponin I (High Sensitivity): 56 ng/L — ABNORMAL HIGH (ref <18)

## 2019-06-27 LAB — RESPIRATORY PANEL BY RT PCR (FLU A&B, COVID)
Influenza A by PCR: NEGATIVE
Influenza B by PCR: NEGATIVE
SARS Coronavirus 2 by RT PCR: NEGATIVE

## 2019-06-27 LAB — HEMOGLOBIN A1C
Hgb A1c MFr Bld: 5.4 % (ref 4.8–5.6)
Mean Plasma Glucose: 108.28 mg/dL

## 2019-06-27 LAB — TSH: TSH: 1.138 u[IU]/mL (ref 0.350–4.500)

## 2019-06-27 LAB — PROCALCITONIN: Procalcitonin: <0.1 ng/mL

## 2019-06-27 MED ORDER — LEVOFLOXACIN IN D5W 250 MG/50ML IV SOLN: 250.0000 mg | INTRAVENOUS | Status: DC

## 2019-06-27 MED ORDER — LEVOFLOXACIN IN D5W 500 MG/100ML IV SOLN: 500.0000 mg | INTRAVENOUS | Status: DC

## 2019-06-27 MED ORDER — ONDANSETRON HCL 4 MG PO TABS: 4.0000 mg | ORAL_TABLET | Freq: Four times a day (QID) | ORAL | Status: DC | PRN

## 2019-06-27 MED ORDER — ENOXAPARIN SODIUM 30 MG/0.3ML ~~LOC~~ SOLN
30.0000 mg | SUBCUTANEOUS | Status: DC
  Administered 2019-06-27 – 2019-07-01 (×5): 30 mg via SUBCUTANEOUS
  Filled 2019-06-27 (×5): qty 0.3

## 2019-06-27 MED ORDER — BUDESONIDE 0.25 MG/2ML IN SUSP
0.5000 mg | Freq: Two times a day (BID) | RESPIRATORY_TRACT | Status: DC
  Administered 2019-06-27 – 2019-07-02 (×5): 0.5 mg via RESPIRATORY_TRACT
  Filled 2019-06-27 (×13): qty 4

## 2019-06-27 MED ORDER — ASPIRIN EC 81 MG PO TBEC
81.0000 mg | DELAYED_RELEASE_TABLET | Freq: Every day | ORAL | Status: DC
  Administered 2019-06-27 – 2019-07-02 (×6): 81 mg via ORAL
  Filled 2019-06-27 (×5): qty 1

## 2019-06-27 MED ORDER — SERTRALINE HCL 50 MG PO TABS
200.0000 mg | ORAL_TABLET | Freq: Every day | ORAL | Status: DC
  Administered 2019-06-27 – 2019-07-02 (×6): 200 mg via ORAL
  Filled 2019-06-27 (×6): qty 4

## 2019-06-27 MED ORDER — HEPARIN SODIUM (PORCINE) 5000 UNIT/ML IJ SOLN
5000.0000 [IU] | Freq: Three times a day (TID) | INTRAMUSCULAR | Status: DC
  Administered 2019-06-27: 5000 [IU] via SUBCUTANEOUS
  Filled 2019-06-27: qty 1

## 2019-06-27 MED ORDER — AMLODIPINE BESYLATE 5 MG PO TABS
2.5000 mg | ORAL_TABLET | Freq: Every day | ORAL | Status: DC
  Administered 2019-06-27 – 2019-06-30 (×4): 2.5 mg via ORAL
  Filled 2019-06-27 (×4): qty 1

## 2019-06-27 MED ORDER — IPRATROPIUM-ALBUTEROL 0.5-2.5 (3) MG/3ML IN SOLN
3.0000 mL | Freq: Four times a day (QID) | RESPIRATORY_TRACT | Status: DC
  Administered 2019-06-27 – 2019-06-28 (×3): 3 mL via RESPIRATORY_TRACT
  Filled 2019-06-27 (×3): qty 3

## 2019-06-27 MED ORDER — ACETAMINOPHEN 325 MG PO TABS
650.0000 mg | ORAL_TABLET | Freq: Four times a day (QID) | ORAL | Status: DC | PRN
  Administered 2019-06-27: 650 mg via ORAL
  Filled 2019-06-27: qty 2

## 2019-06-27 MED ORDER — ACETAMINOPHEN 650 MG RE SUPP: 650.0000 mg | Freq: Four times a day (QID) | RECTAL | Status: DC | PRN

## 2019-06-27 MED ORDER — HYDRALAZINE HCL 25 MG PO TABS: 25.0000 mg | ORAL_TABLET | Freq: Four times a day (QID) | ORAL | Status: DC | PRN

## 2019-06-27 MED ORDER — FUROSEMIDE 10 MG/ML IJ SOLN
80.0000 mg | Freq: Once | INTRAMUSCULAR | Status: AC
  Administered 2019-06-27: 80 mg via INTRAVENOUS
  Filled 2019-06-27: qty 8

## 2019-06-27 MED ORDER — ONDANSETRON HCL 4 MG/2ML IJ SOLN: 4.0000 mg | Freq: Four times a day (QID) | INTRAMUSCULAR | Status: DC | PRN

## 2019-06-27 MED ORDER — METOPROLOL SUCCINATE ER 25 MG PO TB24
12.5000 mg | ORAL_TABLET | Freq: Every day | ORAL | Status: DC
  Administered 2019-06-27 – 2019-07-01 (×5): 12.5 mg via ORAL
  Filled 2019-06-27 (×3): qty 1
  Filled 2019-06-27: qty 0.5
  Filled 2019-06-27: qty 1

## 2019-06-27 MED ORDER — BUDESONIDE 180 MCG/ACT IN AEPB: 1.0000 | INHALATION_SPRAY | Freq: Two times a day (BID) | RESPIRATORY_TRACT | Status: DC

## 2019-06-27 MED ORDER — LEVOFLOXACIN IN D5W 500 MG/100ML IV SOLN: 500.0000 mg | Freq: Once | INTRAVENOUS | Status: DC

## 2019-06-27 MED ORDER — SODIUM CHLORIDE 0.9% FLUSH
10.0000 mL | Freq: Two times a day (BID) | INTRAVENOUS | Status: DC
  Administered 2019-06-27 – 2019-07-02 (×9): 10 mL via INTRAVENOUS

## 2019-06-27 MED ORDER — LOSARTAN POTASSIUM 50 MG PO TABS
50.0000 mg | ORAL_TABLET | Freq: Every day | ORAL | Status: DC
  Administered 2019-06-27 – 2019-06-28 (×2): 50 mg via ORAL
  Filled 2019-06-27 (×2): qty 1

## 2019-06-27 MED ORDER — ALBUTEROL SULFATE (2.5 MG/3ML) 0.083% IN NEBU: 2.5000 mg | INHALATION_SOLUTION | RESPIRATORY_TRACT | Status: DC | PRN

## 2019-06-27 MED ORDER — PANTOPRAZOLE SODIUM 40 MG PO TBEC
40.0000 mg | DELAYED_RELEASE_TABLET | Freq: Every day | ORAL | Status: DC
  Administered 2019-06-27 – 2019-07-02 (×6): 40 mg via ORAL
  Filled 2019-06-27 (×6): qty 1

## 2019-06-27 MED ORDER — TRAMADOL HCL 50 MG PO TABS
50.0000 mg | ORAL_TABLET | Freq: Four times a day (QID) | ORAL | Status: DC | PRN
  Administered 2019-06-27 – 2019-06-29 (×2): 50 mg via ORAL
  Filled 2019-06-27 (×2): qty 1

## 2019-06-27 MED ORDER — TORSEMIDE 20 MG PO TABS
20.0000 mg | ORAL_TABLET | Freq: Every day | ORAL | Status: DC
  Filled 2019-06-27: qty 1

## 2019-06-27 NOTE — ED Notes (Signed)
Admitting MD at bedside.

## 2019-06-27 NOTE — ED Notes (Signed)
Attempted to call report to floor 

## 2019-06-27 NOTE — Progress Notes (Signed)
*  PRELIMINARY RESULTS* Echocardiogram 2D Echocardiogram has been performed.  Misty Berry 06/27/2019, 9:45 AM

## 2019-06-27 NOTE — H&P (Addendum)
History and Physical    Misty Berry [id number] DOB: 1934/07/10 DOA: 06/26/2019  PCP: Nelia Shi, MD  Patient coming from: Home  I have personally briefly reviewed patient's old medical records in [place]  Chief Complaint: Shortness of breath  HPI: Misty Berry is a 85 y.o. female with medical history significant of hypertensive heart disease, history of systolic heart failure NYHA class II, chronic kidney disease stage IV, asthma, history disorder GERD, hyperparathyroidism, hypercholesterolemia, cognitive deficit NOS who presents to the ED with acute episode of shortness of breath.  Patient was brought in by EMS, per EMS patient at home was found to be very shortness  of breath and was noted to have sats in the 80s she was placed on 6 L nasal cannula and then transported to the ED.  Per patient she states that the episode occurred acutely.  Prior to the episode occurring she was in her normal state of health. She states she was sitting watching TV and then all of a sudden got severely short of breath.  She notes no associated chest pain with the symptoms and denies any history of cough fever or chills.  She also denies any sick contacts.  States that she has had episodes like this in the past but they usually resolve.  Patient also notes that she has not taken her medications for months on and.  Although it is noted that patient does have a history of memory loss and had recently seen her PCP a week ago without any mention of this in PCP notes.  In any event currently on exam patient notes that she is no longer feeling short of breath feels back to her baseline.  Of note during the time in the ED patient was treated with nebulizers for her initial presentation with hypoxemia and wheezing.  She was also treated with antibiotics for infiltrates noted on chest x-ray with concern for pneumonia in addition to Lasix for concern of heart failure due to elevated BNP.  Status  post this treatment treatments patient was able to be weaned off oxygen and is currently on room air satting in the high 90s to 100%.  Patient is now admitted for further monitoring and evaluation.   ED Course:  As noted above  Review of Systems: As per HPI otherwise 10 point review of systems negative.   Past Medical History:  Diagnosis Date  . Asthma   . Bruises easily   . Cellulitis   . Cough   . DDD (degenerative disc disease), lumbar   . GERD (gastroesophageal reflux disease)   . HBP (high blood pressure)   . Hypercholesterolemia   . Hyperparathyroidism ([place])   . Osteoarthritis   . Osteoporosis   . Rash   . Renal disorder    kidney disease  . Swelling     Past Surgical History:  Procedure Laterality Date  . APPENDECTOMY    . BREAST REDUCTION SURGERY    . CHOLECYSTECTOMY    . EYE SURGERY Left   . FEMUR SURGERY    . FOOT SURGERY Right   . GALLBLADDER SURGERY    . LIPECTOMY    . PAROTID GLAND TUMOR EXCISION    . THYROID SURGERY    . TONSILLECTOMY    . TUBAL LIGATION    . VITRECTOMY       reports that she has never smoked. She has never used smokeless tobacco. She reports that she does not drink alcohol or use drugs.  Allergies  Allergen Reactions  . Ivp Dye [Iodinated Diagnostic Agents] Other (See Comments)  . Lactose   . Methylprednisolone     Other reaction(s): Hallucination, Other (See Comments) Psychosis  . Other Other (See Comments)    Swelling and vomitting  . Peanuts [Peanut Oil]     Swelling and vomitting  . Vancomycin   . Wellbutrin [Bupropion] Other (See Comments)    Hallucinations    No family history on file.  Prior to Admission medications   Medication Sig Start Date End Date Taking? Authorizing Provider  amLODipine (NORVASC) 2.5 MG tablet Take 2.5 mg by mouth daily.  12/13/17   [provider]  aspirin EC 81 MG tablet Take 81 mg by mouth daily.     [provider]  budesonide-formoterol (SYMBICORT) 160-4.5 MCG/ACT  inhaler Inhale 1 puff into the lungs 2 (two) times daily.     [provider]  calcium-vitamin D (OSCAL WITH D) 500-200 MG-UNIT TABS tablet Take 1 tablet by mouth daily.     [provider]  clobetasol ointment (TEMOVATE) [id number] % Apply 1 application topically 2 (two) times daily. To shins    [provider]  esomeprazole (NEXIUM) 40 MG capsule Take 40 mg by mouth daily at 12 noon.    [provider]  ferrous gluconate (FERGON) 324 MG tablet Take 324 mg by mouth daily.     [provider]  L-Methylfolate-Algae (DEPLIN 15) 15-90.314 MG CAPS Take 15 mg by mouth daily.     [provider]  L-Theanine 100 MG CAPS Take 200 mg by mouth 4 (four) times daily.    [provider]  levETIRAcetam (KEPPRA) 500 MG tablet Take 1 tablet (500 mg total) by mouth daily after supper. 12/29/18   Nena Polio, MD  losartan (COZAAR) 25 MG tablet Take 50 mg by mouth daily.  06/18/15   [provider]  metoprolol succinate (TOPROL-XL) 25 MG 24 hr tablet Take 12.5 mg by mouth daily.  06/17/15   [provider]  MILK THISTLE PO Take 1 tablet by mouth daily.     [provider]  Multiple Vitamins-Minerals (CENTRUM PO) Take 1 tablet by mouth daily.     [provider]  Omega-3 Fatty Acids (FISH OIL) 1000 MG CAPS Take 1,000 mg by mouth 4 (four) times daily.     [provider]  S-Adenosylmethionine (SAM-E) 200 MG TABS Take 200 mg by mouth 2 (two) times a day.    [provider]  torsemide (DEMADEX) 20 MG tablet Take 20 mg by mouth daily.  09/16/17   [provider]  vitamin B-12 (CYANOCOBALAMIN) 1000 MCG tablet Take 1,000 mcg by mouth daily.     [provider]  ZOLOFT 100 MG tablet Take 200 mg by mouth daily.  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 Posterior pharynx clear of any exudate or lesions.Normal dentition.  Neck: normal, supple, no masses, no thyromegaly Respiratory: clear to auscultation bilaterally, no wheezing, no crackles. Normal respiratory effort. No accessory muscle use.  Cardiovascular: Regular rate and rhythm, no murmurs / rubs / gallops.  Positive lower extremity edema. 2+ pedal pulses. No carotid bruits.  Abdomen: no tenderness, no masses palpated. No hepatosplenomegaly. Bowel sounds positive.  Musculoskeletal: no clubbing / cyanosis. No joint deformity upper and lower extremities. Good ROM, no contractures. Normal muscle tone.  Skin: no rashes, lesions, ulcers. No induration Neurologic: CN 2-12 grossly intact. Sensation intact, DTR normal. Strength 5/5 in all 4.  Psychiatric: Normal judgment and insight. Alert and oriented x 3. Normal mood.    Labs on Admission: I have personally reviewed following labs and imaging studies  CBC: Recent Labs  Lab 06/26/19 2237  WBC 7.0  HGB 9.2*  HCT 30.4*  MCV 91.3  PLT 123456   Basic Metabolic Panel: Recent Labs  Lab 06/26/19 2237  NA 141  K 5.0  CL 112*  CO2 20*  GLUCOSE 101*  BUN 47*  CREATININE 2.95*  CALCIUM 9.0   GFR: Estimated Creatinine Clearance: 14.8 mL/min (A) (by C-G formula based on SCr of 2.95 mg/dL (H)). Liver Function Tests: Recent Labs  Lab 06/26/19 2237  AST 28  ALT 14  ALKPHOS 69  BILITOT 1.1  PROT 7.1  ALBUMIN 3.7   No results for input(s): LIPASE, AMYLASE in the last 168  hours. No results for input(s): AMMONIA in the last 168 hours. Coagulation Profile: No results for input(s): INR, PROTIME in the last 168 hours. Cardiac Enzymes: No results for input(s): CKTOTAL, CKMB, CKMBINDEX, TROPONINI in the last 168 hours. BNP (last 3 results) No results for input(s): PROBNP in the last 8760 hours. HbA1C: No results for input(s): HGBA1C in the last 72 hours. CBG: No results for input(s): GLUCAP in the last 168 hours. Lipid Profile: No results for input(s): CHOL, HDL, LDLCALC, TRIG, CHOLHDL, LDLDIRECT in the last 72 hours. Thyroid Function Tests: No results for input(s): TSH, T4TOTAL, FREET4, T3FREE, THYROIDAB in the last 72 hours. Anemia Panel: No results for input(s): VITAMINB12, FOLATE, FERRITIN, TIBC, IRON, RETICCTPCT in the last 72 hours. Urine analysis:    Component Value Date/Time   COLORURINE STRAW (A) 12/29/2018 1425   APPEARANCEUR HAZY (A) 12/29/2018 1425   APPEARANCEUR Clear 04/19/2013 0807   LABSPEC 1.009 12/29/2018 1425   LABSPEC 1.012 04/19/2013 0807   PHURINE 5.0 12/29/2018 1425   GLUCOSEU NEGATIVE 12/29/2018 1425   GLUCOSEU 50 mg/dL 04/19/2013 0807   HGBUR MODERATE (A) 12/29/2018 1425   BILIRUBINUR NEGATIVE 12/29/2018 1425   BILIRUBINUR Negative 04/19/2013 0807   KETONESUR NEGATIVE 12/29/2018 1425   PROTEINUR NEGATIVE 12/29/2018 1425   NITRITE NEGATIVE 12/29/2018 1425   LEUKOCYTESUR MODERATE (A) 12/29/2018 1425   LEUKOCYTESUR Negative 04/19/2013 0807    Radiological Exams on Admission: DG Chest Portable 1 View  Result Date: 06/26/2019 CLINICAL DATA:  Shortness of breath EXAM: PORTABLE CHEST 1 VIEW COMPARISON:  12/29/2018 FINDINGS: Heart is borderline in size. Patchy bilateral airspace opacities. Moderate-sized hiatal hernia. No effusions. No acute bony abnormality. IMPRESSION: Patchy bilateral airspace opacities concerning for pneumonia. Electronically Signed   By: Rolm Baptise M.D.   On: 06/26/2019 22:53  EKG: Independently  reviewed.  Sinus tachycardia nonacute  Assessment/Plan Mild asthma exacerbation with acute Hypoxemia Community acquired pneumonia Mildly abnormal troponin Anemia  Plan  Mild asthma exacerbation with acute hypoxemia -Hypoxemia currently resolved status post treatment in the ED with nebulizers -per ed md wheezing on exam,Currently on exam no wheezing -We will continue monitor patient progress -Resume patient's home inhalers/patient states she has been noncompliant with these -monitor off steroids/ patient has hx of steroid psychosis -Nebulizers   Community acquired pneumonia -Patient without any fever no cough or white count -Noted infiltrate on imaging/further evaluate with CT thorax chest -Will continue on community-acquired ABX for now -May be able to de-escalate rapidly  Mildly abnormal troponin -Not thought to be ACS most likely related to demand ischemia in the setting of chronic kidney disease -Continue to  trend troponins  -No current echo in the system available we will repeat in a.m. -Consider cardiology consult if troponins remain elevated  Anemia -Most likely related to CKD -Currently stable from prior  CKD stage IV -At baseline  Hypertension -Patient noted to be hypertensive on admission to the ED -Clear about patient compliance with medication -Resume home medications at this time  History of systolic heart failure -Diagnosis documented in PCP note -Possible mild element of acute exacerbation -No echo in system available -Echo pending -Elevated BNP no prior to compare system possibly chronic elevation in the setting of renal failure -Status post Lasix in ED -Monitor strict I's and O's and daily weights -Resume outpatient diuresis/IV Lasix as needed  Seizure disorder -Continue Keppra  Depression and anxiety -Patient states she has not been taking her medications -PCP to follow-up with resumption of these medications as outpatient  Cognitive deficit  NOS  GERD -PPI  DVT prophylaxis: Heparin  code Status: Full code  family Communication: Not applicable   disposition Plan: 24 to 48 hours  consults called: None  admission status: Observation  Clance Boll MD [facility]  If 7PM-7AM, please contact night-coverage [email or web address] Password [facility]  06/27/2019, 12:07 AM

## 2019-06-27 NOTE — Progress Notes (Signed)
PROGRESS NOTE    Misty Berry  [id number] DOB: Jan 04, 1935 DOA: 06/26/2019 PCP: Nelia Shi, MD      Brief Narrative:  Misty Berry is a 85 y.o. F with asthma, CKD stage IV baseline Cr 2.9-3.5, HTN, mild dementia, sCHF EF 40% 2015, and left eye blindness who presents with sudden onset shortness of breath.  In the ER, chest x-ray showed bilateral opacities.  BNP >1000.  She was breathing 25-30 times per minute, SPO2 was documented in the 80s with EMS.  WBC 7K.  COVID-.          Assessment & Plan:  Acute on chronic systolic and diastolic CHF Acute hypoxic respiratory failure Patient was admitted with CXR showing bibasilar opacities, CT with small effusions, no wheezing, diagnosed with asthma flare, started on inhaled steroids and bronchodilators.  However, it was further noted after continued work up that she had elevated BNP, previously noted impaired EF [id number] and diastolic dysfunction in [id number] at [facility].  Echo report today reviewed, EF today is now down to 35-40%.  Suspect CHF flare   -Start furosemide 60 mg IV  -Strict I/Os, daily weights, telemetry  -Daily monitoring renal function -Hold torsemide -Continue BB, ARB    Doubt asthma exacerbation -Continue ICS -Continue scheduled bronchodilators  Pneumonia unlikely -Stop antibiotics -Check procalcitonin  Elevated troponin Due to demand ishcemia, myocardial stretch from CHF, no further ischemic work up needed  Anemia of CKD Stable relative to baseline  Chronic kidney disease stage IV Stable relative to baseline -Close daily monitoring while on IV diuretic  Hypertension Blood pressure elevated -Continue amlodipine, metoprolol, diuretic -Resume ARB  Former history seizures  No longer on AED  Depression Possible dementia -Continue sertraline  GERD -Continue PPI             Disposition: The patient was admitted with dyspnea, tachypnea and SpO2 <90% with EMS and here.  CXR and BNP  suggest CHF flare.   Started diuresis, I will discharge when she has been adequately diuresed and PT have recommended location for rehab.        MDM: The below labs and imaging reports were reviewed and summarized above.  Medication management as above. Severe exacerbation fo chronic illness with worsened E.   DVT prophylaxis: Lovenox, dose reduced Code Status: FULL Family Communication: brother by phone    Consultants:     Procedures:   1/13 echo -- ef 35%  Antimicrobials:   Ctx  azith    Subjective: Patient still feeling tired, weak.  No wheezing.  No dysuria.  No fever.  No sputum or hemoptysis.  She has some moderate, constant left-sided chest discomfort in the ribs, worse with taking a deep breath.  Objective: Vitals:   06/27/19 1145 06/27/19 1200 06/27/19 1400 06/27/19 1545  BP:  (!) 151/85 (!) 108/94 (!) 160/79  Pulse: 73 76 93 70  Resp: 19 (!) 24 20 16   Temp:    (!) 97.5 F (36.4 C)  TempSrc:    Oral  SpO2: 99% 99% 96% 100%  Weight:    80.5 kg  Height:    5\' 4"  (1.626 m)    Intake/Output Summary (Last 24 hours) at 06/27/2019 1617 Last data filed at 06/27/2019 1449 Gross per 24 hour  Intake 350 ml  Output 1300 ml  Net -950 ml   Filed Weights   06/26/19 2235 06/27/19 1545  Weight: 77.1 kg 80.5 kg    Examination: General appearance: BMI 73 adult female, alert and in no  acute distress.  Appears very tired. HEENT: One of her eyes is fossae.  Anicteric, conjunctiva pink, lids and lashes normal. No nasal deformity, discharge, epistaxis.  Lips moist, oropharynx tacky dry, no oral lesions, hearing normal.   Skin: Warm and dry.  No jaundice.  No suspicious rashes or lesions. Cardiac: RRR, nl S1-S2, no murmurs appreciated.  Capillary refill is brisk.  JVPnot visible.  2+ LE edema.  Radial pulses 2+ and symmetric. Respiratory: Normal respiratory rate and rhythm.  Rales in the left base, no wheezing. Abdomen: Abdomen soft.  No TTP or guarding. No ascites,  distension, hepatosplenomegaly.   MSK: No deformities or effusions.  Chronic osteoarthritis deformities of the hands. Neuro: Awake and alert.  EOMI, moves all extremities with severe generalized weakness, symmetric strength. Speech fluent.    Psych: Sensorium intact and responding to questions, attention normal. Affect pleasant.  Judgment and insight appear normal.    Data Reviewed: I have personally reviewed following labs and imaging studies:  CBC: Recent Labs  Lab 06/26/19 2237 06/27/19 0456  WBC 7.0 7.0  HGB 9.2* 8.0*  HCT 30.4* 25.6*  MCV 91.3 90.5  PLT 201 [id number]   Basic Metabolic Panel: Recent Labs  Lab 06/26/19 2237 06/27/19 0456  NA 141 144  K 5.0 4.8  CL 112* 115*  CO2 20* 22  GLUCOSE 101* 108*  BUN 47* 49*  CREATININE 2.95* 3.03*  CALCIUM 9.0 9.0   GFR: Estimated Creatinine Clearance: 14.7 mL/min (A) (by C-G formula based on SCr of 3.03 mg/dL (H)). Liver Function Tests: Recent Labs  Lab 06/26/19 2237 06/27/19 0456  AST 28 16  ALT 14 12  ALKPHOS 69 61  BILITOT 1.1 0.8  PROT 7.1 6.6  ALBUMIN 3.7 3.3*   No results for input(s): LIPASE, AMYLASE in the last 168 hours. No results for input(s): AMMONIA in the last 168 hours. Coagulation Profile: No results for input(s): INR, PROTIME in the last 168 hours. Cardiac Enzymes: No results for input(s): CKTOTAL, CKMB, CKMBINDEX, TROPONINI in the last 168 hours. BNP (last 3 results) No results for input(s): PROBNP in the last 8760 hours. HbA1C: Recent Labs    06/27/19 0456  HGBA1C 5.4   CBG: No results for input(s): GLUCAP in the last 168 hours. Lipid Profile: No results for input(s): CHOL, HDL, LDLCALC, TRIG, CHOLHDL, LDLDIRECT in the last 72 hours. Thyroid Function Tests: Recent Labs    06/27/19 0456  TSH 1.138   Anemia Panel: No results for input(s): VITAMINB12, FOLATE, FERRITIN, TIBC, IRON, RETICCTPCT in the last 72 hours. Urine analysis:    Component Value Date/Time   COLORURINE COLORLESS (A)  06/27/2019 0456   APPEARANCEUR CLEAR (A) 06/27/2019 0456   APPEARANCEUR Clear 04/19/2013 0807   LABSPEC 1.008 06/27/2019 0456   LABSPEC 1.012 04/19/2013 0807   PHURINE 6.0 06/27/2019 0456   GLUCOSEU NEGATIVE 06/27/2019 0456   GLUCOSEU 50 mg/dL 04/19/2013 0807   HGBUR NEGATIVE 06/27/2019 0456   BILIRUBINUR NEGATIVE 06/27/2019 0456   BILIRUBINUR Negative 04/19/2013 0807   KETONESUR NEGATIVE 06/27/2019 0456   PROTEINUR 30 (A) 06/27/2019 0456   NITRITE NEGATIVE 06/27/2019 0456   LEUKOCYTESUR NEGATIVE 06/27/2019 0456   LEUKOCYTESUR Negative 04/19/2013 0807   Sepsis Labs: @LABRCNTIP (procalcitonin:4,lacticacidven:4)  ) Recent Results (from the past 240 hour(s))  Blood culture (routine x 2)     Status: None (Preliminary result)   Collection Time: 06/27/19 12:47 AM   Specimen: BLOOD  Result Value Ref Range Status   Specimen Description BLOOD BLOOD LEFT FOREARM  Final  Special Requests   Final    BOTTLES DRAWN AEROBIC AND ANAEROBIC Blood Culture results may not be optimal due to an excessive volume of blood received in culture bottles   Culture   Final    NO GROWTH < 12 HOURS Performed at [facility], [address] [place], [place] [address]    Report Status PENDING  Incomplete  Blood culture (routine x 2)     Status: None (Preliminary result)   Collection Time: 06/27/19 12:47 AM   Specimen: BLOOD  Result Value Ref Range Status   Specimen Description BLOOD BLOOD RIGHT HAND  Final   Special Requests   Final    BOTTLES DRAWN AEROBIC AND ANAEROBIC Blood Culture adequate volume   Culture   Final    NO GROWTH < 12 HOURS Performed at [facility], [address] [place], [place] [address]    Report Status PENDING  Incomplete  Respiratory Panel by RT PCR (Flu A&B, Covid) - Nasopharyngeal Swab     Status: None   Collection Time: 06/27/19 12:47 AM   Specimen: Nasopharyngeal Swab  Result Value Ref Range Status   SARS Coronavirus 2 by RT PCR NEGATIVE NEGATIVE  Final    Comment: (NOTE) SARS-CoV-2 target nucleic acids are NOT DETECTED. The SARS-CoV-2 RNA is generally detectable in upper respiratoy specimens during the acute phase of infection. The lowest concentration of SARS-CoV-2 viral copies this assay can detect is 131 copies/mL. A negative result does not preclude SARS-Cov-2 infection and should not be used as the sole basis for treatment or other patient management decisions. A negative result may occur with  improper specimen collection/handling, submission of specimen other than nasopharyngeal swab, presence of viral mutation(s) within the areas targeted by this assay, and inadequate number of viral copies (<131 copies/mL). A negative result must be combined with clinical observations, patient history, and epidemiological information. The expected result is Negative. Fact Sheet for Patients:  PinkCheek.be Fact Sheet for Healthcare Providers:  GravelBags.it This test is not yet ap proved or cleared by the [place] FDA and  has been authorized for detection and/or diagnosis of SARS-CoV-2 by FDA under an Emergency Use Authorization (EUA). This EUA will remain  in effect (meaning this test can be used) for the duration of the COVID-19 declaration under Section 564(b)(1) of the Act, 21 U.S.C. section 360bbb-3(b)(1), unless the authorization is terminated or revoked sooner.    Influenza A by PCR NEGATIVE NEGATIVE Final   Influenza B by PCR NEGATIVE NEGATIVE Final    Comment: (NOTE) The Xpert Xpress SARS-CoV-2/FLU/RSV assay is intended as an aid in  the diagnosis of influenza from Nasopharyngeal swab specimens and  should not be used as a sole basis for treatment. Nasal washings and  aspirates are unacceptable for Xpert Xpress SARS-CoV-2/FLU/RSV  testing. Fact Sheet for Patients: PinkCheek.be Fact Sheet for Healthcare  Providers: GravelBags.it This test is not yet approved or cleared by the [place] FDA and  has been authorized for detection and/or diagnosis of SARS-CoV-2 by  FDA under an Emergency Use Authorization (EUA). This EUA will remain  in effect (meaning this test can be used) for the duration of the  Covid-19 declaration under Section 564(b)(1) of the Act, 21  U.S.C. section 360bbb-3(b)(1), unless the authorization is  terminated or revoked. Performed at [facility], [address] [place], [place] [address]          Radiology Studies: CT CHEST WO CONTRAST  Result Date: 06/27/2019 CLINICAL DATA:  Shortness of breath at rest and with exertion. EXAM: CT CHEST WITHOUT CONTRAST TECHNIQUE: Multidetector CT imaging of the chest was performed following the standard protocol without IV contrast. COMPARISON:  None. FINDINGS: Cardiovascular: Trace anterior pericardial fluid and/or thickening. Overall normal heart size. Atherosclerotic calcification of the aorta and coronaries. Mediastinum/Nodes: Moderate hiatal hernia containing both fat and stomach. No adenopathy, inflammatory changes, or mass. Lungs/Pleura: Dependent pulmonary opacity with volume loss and small pleural effusions. No edema, consolidation, or generalized ground-glass opacity. Upper Abdomen: Partially covered left renal cystic densities. Musculoskeletal: Exaggerated kyphosis with scoliosis and bridging thoracic osteophytes. Healed right posterior rib fractures. Subcutaneous edema along the right flank which appears asymmetric on reformats. Question underlying trauma. IMPRESSION: 1. Dependent atelectasis and small pleural effusions. 2. Aortic and coronary atherosclerosis. 3. Subcutaneous edema along the right flank could be positional anasarca but is not seen on the left, please correlate for underlying trauma. 4. Multilevel thoracic ankylosis. 5. Hiatal hernia. Electronically Signed   By: Monte Fantasia M.D.   On: 06/27/2019 04:37   DG Chest Portable 1 View  Result Date: 06/26/2019 CLINICAL DATA:  Shortness of breath EXAM: PORTABLE CHEST 1 VIEW COMPARISON:  12/29/2018 FINDINGS: Heart is borderline in size. Patchy bilateral airspace opacities. Moderate-sized hiatal hernia. No effusions. No acute bony abnormality. IMPRESSION: Patchy bilateral airspace opacities concerning for pneumonia. Electronically Signed   By: Rolm Baptise M.D.   On: 06/26/2019 22:53   ECHOCARDIOGRAM COMPLETE  Result Date: 06/27/2019   ECHOCARDIOGRAM REPORT   Patient Name:   Misty Berry Date of Exam: 06/27/2019 Medical Rec #:  [id number]      Height:       64.0 in Accession #:    [id number]     Weight:       170.0 lb Date of Birth:  1935-03-14      BSA:          1.83 m Patient Age:    14 years       BP:           186/97 mmHg Patient Gender: F              HR:           80 bpm. Exam Location:  [facility] Procedure: 2D Echo, Color Doppler and Cardiac Doppler Indications:     CHF 428.0  History:         Patient has no prior history of Echocardiogram examinations.                  High blood pressure, hypercholesterolemia.  Sonographer:     Sherrie Sport RDCS (AE) Referring Phys:  [id number] Victoriano Lain A THOMAS Diagnosing Phys: Kate Sable MD  Sonographer Comments: No apical window and no subcostal window. Patient could not lie on left side. IMPRESSIONS  1. Left ventricular ejection fraction, by visual estimation, is 35 to 40%. The left ventricle has moderately decreased function. There is no left ventricular hypertrophy.  2. Left ventricular diastolic function could not be evaluated.  3. The left ventricle demonstrates regional wall motion abnormalities.  4. LV inferior wall hypokinesis.  5. Global right ventricle was not well visualized.The right ventricular size is not assessed. Right vetricular wall thickness was not assessed.  6. Left atrial size was normal.  7. Right atrial size was not well visualized.  8. The mitral valve is normal in  structure. No evidence of mitral valve regurgitation.  9. The tricuspid valve is grossly normal. 10. The aortic valve  is grossly normal. Aortic valve regurgitation is not visualized. 11. Pulmonic regurgitation not assessed. 12. The pulmonic valve was not well visualized. Pulmonic valve regurgitation not assessed. 13. The interatrial septum was not well visualized. FINDINGS  Left Ventricle: Left ventricular ejection fraction, by visual estimation, is 35 to 40%. The left ventricle has moderately decreased function. The left ventricle demonstrates regional wall motion abnormalities. There is no left ventricular hypertrophy. Left ventricular diastolic function could not be evaluated. LV inferior wall hypokinesis. Right Ventricle: The right ventricular size is not assessed. Right vetricular wall thickness was not assessed. Global RV systolic function is was not well visualized. Left Atrium: Left atrial size was normal in size. Right Atrium: Right atrial size was not well visualized Pericardium: There is no evidence of pericardial effusion. Mitral Valve: The mitral valve is normal in structure. No evidence of mitral valve regurgitation. Tricuspid Valve: The tricuspid valve is grossly normal. Tricuspid valve regurgitation is not demonstrated. Aortic Valve: The aortic valve is grossly normal. Aortic valve regurgitation is not visualized. Pulmonic Valve: The pulmonic valve was not well visualized. Pulmonic valve regurgitation not assessed. Pulmonic regurgitation not assessed. Aorta: The aortic root is normal in size and structure. Venous: The inferior vena cava was not well visualized. IAS/Shunts: The interatrial septum was not well visualized.  LEFT VENTRICLE PLAX 2D LVIDd:         4.92 cm LVIDs:         4.09 cm LV PW:         1.33 cm LV IVS:        1.61 cm LVOT diam:     2.10 cm LV SV:         40 ml LV SV Index:   21.22 LVOT Area:     3.46 cm  LEFT ATRIUM         Index LA diam:    5.40 cm 2.96 cm/m                         PULMONIC VALVE AORTA                 RVOT Peak grad: 1 mmHg Ao Root diam: 2.60 cm   SHUNTS Systemic Diam: 2.10 cm  Kate Sable MD Electronically signed by Kate Sable MD Signature Date/Time: 06/27/2019/1:49:04 PM    Final         Scheduled Meds:  amLODipine  2.5 mg Oral Daily   aspirin EC  81 mg Oral Daily   budesonide  0.5 mg Inhalation BID   enoxaparin (LOVENOX) injection  30 mg Subcutaneous Q24H   ipratropium-albuterol  3 mL Nebulization Q6H   losartan  50 mg Oral Daily   metoprolol succinate  12.5 mg Oral Daily   pantoprazole  40 mg Oral Daily   sertraline  200 mg Oral Daily   Continuous Infusions:    LOS: 0 days    Time spent: 35 minutes    Edwin Dada, MD [facility] 06/27/2019, 4:17 PM     Please page though [place] or Epic secure chat:  For [employer], Adult nurse

## 2019-06-27 NOTE — Progress Notes (Signed)
Patient is confused. Patient has removed central monitor. Patient is attempting to get out of bed. Patient is very short of breath. Patient is disoriented and does not know where or shy she is in the hospital. She is asking where is everyone.

## 2019-06-28 LAB — BASIC METABOLIC PANEL
Anion gap: 9 (ref 5–15)
BUN: 50 mg/dL — ABNORMAL HIGH (ref 8–23)
CO2: 23 mmol/L (ref 22–32)
Calcium: 9 mg/dL (ref 8.9–10.3)
Chloride: 111 mmol/L (ref 98–111)
Creatinine, Ser: 3.31 mg/dL — ABNORMAL HIGH (ref 0.44–1.00)
GFR calc Af Amer: 14 mL/min — ABNORMAL LOW (ref >60)
GFR calc non Af Amer: 12 mL/min — ABNORMAL LOW (ref >60)
Glucose, Bld: 112 mg/dL — ABNORMAL HIGH (ref 70–99)
Potassium: 4.6 mmol/L (ref 3.5–5.1)
Sodium: 143 mmol/L (ref 135–145)

## 2019-06-28 LAB — CBC
HCT: 24.2 % — ABNORMAL LOW (ref 36.0–46.0)
Hemoglobin: 7.6 g/dL — ABNORMAL LOW (ref 12.0–15.0)
MCH: 27.8 pg (ref 26.0–34.0)
MCHC: 31.4 g/dL (ref 30.0–36.0)
MCV: 88.6 fL (ref 80.0–100.0)
Platelets: 184 10*3/uL (ref 150–400)
RBC: 2.73 MIL/uL — ABNORMAL LOW (ref 3.87–5.11)
RDW: 15.8 % — ABNORMAL HIGH (ref 11.5–15.5)
WBC: 6.6 10*3/uL (ref 4.0–10.5)
nRBC: 0 % (ref 0.0–0.2)

## 2019-06-28 LAB — LEGIONELLA PNEUMOPHILA SEROGP 1 UR AG: L. pneumophila Serogp 1 Ur Ag: NEGATIVE

## 2019-06-28 LAB — PROCALCITONIN: Procalcitonin: <0.1 ng/mL

## 2019-06-28 MED ORDER — IPRATROPIUM-ALBUTEROL 0.5-2.5 (3) MG/3ML IN SOLN
3.0000 mL | Freq: Three times a day (TID) | RESPIRATORY_TRACT | Status: DC
  Administered 2019-06-29 – 2019-07-02 (×6): 3 mL via RESPIRATORY_TRACT
  Filled 2019-06-28 (×13): qty 3

## 2019-06-28 NOTE — Evaluation (Signed)
Occupational Therapy Evaluation Patient Details Name: Misty Berry MRN: [id number] DOB: [birth year] Today's Date: 06/28/2019    History of Present Illness Pt is an 85 y.o. female presenting to hospital 06/27/19 with SOB at rest and with exertion.  Pt admitted with mild asthma exacerbation with acute hypoxemia and community acquired PNA.  PMH includes hiatal hernia, asthma, gastric reflux, htn, and CKD.   Clinical Impression   Pt was seen for OT evaluation this date. Prior to hospital admission, pt was MOD I with ADL mobility for household distances. Pt lives with roommate in [facility] with 14 steps with chair lift and an additional 3 STE with L railing that pt can reach. Currently pt demonstrates impairments as described below (See OT problem list) which functionally limit her ability to perform ADL/self-care tasks. Pt currently requires CGA to MIN A with ADL transfers and CGA for standing self care with RW for balance. Pt requiring MIN verbal cues for safety with use of RW and for attention to obstacles in environment for fall prevention.  Pt would benefit from skilled OT to address noted impairments and functional limitations (see below for any additional details) in order to maximize safety and independence while minimizing falls risk and caregiver burden.  Upon hospital discharge, recommend pt discharge home with [place] and intermittent supv for OOB activity for fall prevention/safety at least initially at d/c.    Follow Up Recommendations  Home health OT;Supervision - Intermittent(supv for OOB activity/standing on initial d/c from hospital for fall prevention would be ideal)    Equipment Recommendations  None recommended by OT(pt appears to have all necessary AD/DME)    Recommendations for Other Services       Precautions / Restrictions Precautions Precautions: Fall Restrictions Weight Bearing Restrictions: No      Mobility Bed Mobility Overal bed mobility: Needs Assistance Bed Mobility:  Supine to Sit;Sit to Supine     Supine to sit: Supervision;HOB elevated Sit to supine: Supervision;HOB elevated   General bed mobility comments: increased effort/time for pt to perform on own (pt reports normally sleeping in recliner)  Transfers Overall transfer level: Needs assistance Equipment used: Rolling walker (2 wheeled) Transfers: Sit to/from [facility] Sit to Stand: Min guard;Min assist Stand pivot transfers: Min guard       General transfer comment: CGA to/from higher surfaces, MIN A from regular height commode (pt with BSC over standard seat at home for elevation).    Balance Overall balance assessment: Needs assistance Sitting-balance support: No upper extremity supported;Feet supported Sitting balance-Leahy Scale: Good Sitting balance - Comments: steady sitting reaching within BOS     Standing balance-Leahy Scale: Fair Standing balance comment: pt requiring at least single UE support for standing activities                           ADL either performed or assessed with clinical judgement   ADL Overall ADL's : Needs assistance/impaired Eating/Feeding: Set up;Sitting   Grooming: Wash/dry hands;Min guard;Standing Grooming Details (indicate cue type and reason): with RW sink-side         Upper Body Dressing : Set up;Sitting Upper Body Dressing Details (indicate cue type and reason): increased time and effort d/t L shoudler limited ROM Lower Body Dressing: Min guard;With adaptive equipment;Sit to/from stand Lower Body Dressing Details (indicate cue type and reason): pt able to stand with CAG for clothing mgt over hips. Pt with limited ability to reach b/l feet. Reports using sock-aid and  reacher at home for LB dressing. Toilet Transfer: Surveyor, minerals Details (indicate cue type and reason): Pt requires MIN A to lower to regular height commode with use of grab bars, from more elevated surface (BSC over  commode to elevate) pt stand to sit with CGA. Toileting- Water quality scientist and Hygiene: Min guard;Sit to/from stand       Functional mobility during ADLs: Supervision/safety;Rolling walker(MIN/MOD verbal/tactile cues for safety with use of RW including for safe hand placement and safe body positioning relative to walker (stay w/in RW BOS and move with body when pivoting/turning).)       Vision Baseline Vision/History: Wears glasses Wears Glasses: At all times Patient Visual Report: No change from baseline       Perception     Praxis      Pertinent Vitals/Pain Pain Assessment: 0-10 Pain Score: 6  Pain Location: L ribs, states no pain in bed resting, pain increases to 6 with activity. Pain Descriptors / Indicators: Discomfort;Grimacing;Guarding Pain Intervention(s): Limited activity within patient's tolerance;Monitored during session;Repositioned     Hand Dominance Right   Extremity/Trunk Assessment Upper Extremity Assessment Upper Extremity Assessment: RUE deficits/detail;LUE deficits/detail RUE Deficits / Details: moves through all arc of motion, all grossly >3/5, grip MMT 4/5 LUE Deficits / Details: L shld ROM-flexion to 90 degrees, remote hx broken shoulder, grip MMT 4/5   Lower Extremity Assessment Lower Extremity Assessment: Defer to PT evaluation;Generalized weakness   Cervical / Trunk Assessment Cervical / Trunk Assessment: (FWD head/shoulders)   Communication Communication Communication: HOH   Cognition Arousal/Alertness: Awake/alert Behavior During Therapy: WFL for tasks assessed/performed Overall Cognitive Status: No family/caregiver present to determine baseline cognitive functioning                                 General Comments: Pt with some delayed processing, some decreased safety awareness (attempts to sit in walker as if it has a seat when 2WW does not). Oriented to place (gueses "[facility]" initially, but then able to  correct), somewhat time oriented-month & year, but incorrect date & day. Generalized confusion notable, but pt overall appropriate and able to follow commands.   General Comments       Exercises Other Exercises Other Exercises: OT facilitates safety education relative to using RW for static standing ADLs and use of grab bar versus pulling up on walker from commode. Pt demos MOD/MAX reception of education.   Shoulder Instructions      Home Living Family/patient expects to be discharged to:: Private residence Living Arrangements: Non-relatives/Friends(roommate) Available Help at Discharge: Friend(s)(unable to provide physical assist) Type of Home: House Home Access: Stairs to enter [employer] of Steps: 3 steps with B railings (unable to reach both; normally holds onto L railing ascending/descending); then flight of stairs (14) into home (has chair lift for these steps) Entrance Stairs-Rails: Right;Left Home Layout: One level         Bathroom Toilet: Standard(BSC over commode to elevate)     Home Equipment: Walker - 4 wheels;Shower seat;Bedside commode;Grab bars - tub/shower;Crutches   Additional Comments: pt states she has two walkers: 4ww with seat and 4ww without seat      Prior Functioning/Environment Level of Independence: Independent with assistive device(s)        Comments: Pt reports normally ambulating shorter household distances with 4ww.  Gets groceries delivered.  Sleeps in recliner.  Pt reports no falls in past 6 months.  OT Problem List: Decreased strength;Decreased range of motion;Decreased activity tolerance;Impaired balance (sitting and/or standing);Decreased safety awareness      OT Treatment/Interventions: Self-care/ADL training;Therapeutic exercise;Energy conservation;DME and/or AE instruction;Therapeutic activities;Patient/family education;Balance training    OT Goals(Current goals can be found in the care plan section) Acute Rehab OT  Goals Patient Stated Goal: to go home OT Goal Formulation: With patient Time For Goal Achievement: 07/12/19 Potential to Achieve Goals: Good  OT Frequency: Min 1X/week   Barriers to D/C: Decreased caregiver support          Co-evaluation PT/OT/SLP Co-Evaluation/Treatment: Yes Reason for Co-Treatment: Necessary to address cognition/behavior during functional activity;For patient/therapist safety;To address functional/ADL transfers PT goals addressed during session: Mobility/safety with mobility;Balance;Proper use of DME OT goals addressed during session: ADL's and self-care;Proper use of Adaptive equipment and DME      AM-PAC OT "6 Clicks" Daily Activity     Outcome Measure Help from another person eating meals?: None Help from another person taking care of personal grooming?: A Little Help from another person toileting, which includes using toliet, bedpan, or urinal?: A Little Help from another person bathing (including washing, rinsing, drying)?: A Little Help from another person to put on and taking off regular upper body clothing?: None Help from another person to put on and taking off regular lower body clothing?: A Little 6 Click Score: 20   End of Session Equipment Utilized During Treatment: Gait belt;Rolling walker Nurse Communication: Mobility status  Activity Tolerance: Patient tolerated treatment well;Other (comment)(some increased RR with activity, O2 checked throughout, >91% throughout even with activity.) Patient left: in bed;with call bell/phone within reach;with bed alarm set  OT Visit Diagnosis: Unsteadiness on feet (R26.81);Muscle weakness (generalized) (M62.81)                Time: [id number] OT Time Calculation (min): 39 min Charges:  OT General Charges $OT Visit: 1 Visit OT Evaluation $OT Eval Moderate Complexity: 1 Mod OT Treatments $Self Care/Home Management : 8-22 mins  Gerrianne Scale, MS, OTR/L ascom [phone] 06/28/19, 4:18 PM

## 2019-06-28 NOTE — Progress Notes (Signed)
Misty Mare, NP about patient's increase confusion tonight, patient is impulsive trying to get out of the bed and pulled her IV out. Patient on a tele sitter now, also try to re-direct and reorient patient. Asked if patient can have seroquel, per MD to avoid sedating medication, no orders given. NP change tele sitter to 1:1 sitter. RN will continue to monitor.

## 2019-06-28 NOTE — Progress Notes (Signed)
PROGRESS NOTE    Misty Berry  [id number] DOB: 1935-05-31 DOA: 06/26/2019 PCP: Nelia Shi, MD      Brief Narrative:  Misty Berry is a 85 y.o. F with asthma, CKD stage IV baseline Cr 2.9-3.5, HTN, mild dementia, sCHF EF 40% 2015, and left eye blindness who presents with sudden onset shortness of breath.  In the ER, chest x-ray showed bilateral opacities.  BNP >1000.  She was breathing 25-30 times per minute, SPO2 was documented in the 80s with EMS.  WBC 7K.  COVID-.          Assessment & Plan:  Acute on chronic systolic and diastolic CHF Acute hypoxic respiratory failure Patient was admitted with CXR showing bibasilar opacities, CT with small effusions, no wheezing, diagnosed with asthma flare, started on inhaled steroids and bronchodilators.  However, it was further noted after continued work up that she had elevated BNP, previously noted impaired EF [id number] and diastolic dysfunction in [id number] at [facility].  Echo report today reviewed, EF today is now down to 35-40%.  Suspect CHF flare   -Continue furosemide 60 mg IV  -Strict I/Os, daily weights, telemetry  -Daily monitoring renal function -Hold torsemide -Continue BB, ARB    Delirium  She has developed an acute metabolic encephalopathy due to medical disease, sundowning. Delirium precautions:   -Lights and TV off, minimize interruptions at night  -Blinds open and lights on during day  -Glasses/hearing aid with patient  -Frequent reorientation  -PT/OT when able  -Avoid sedation medications/Beers list medications  Doubt asthma exacerbation -Continue ICS -Continue scheduled bronchodilators  Pneumonia ruled out  Elevated troponin Due to demand ishcemia, myocardial stretch from CHF, no further ischemic work up needed  Anemia of CKD Trending down, worsening.  NO clinical bleeding  Chronic kidney disease stage IV Cr worsening. -Close daily monitoring while on IV diuretic -Hold diuretics -Hold  Losartan  Hypertension Blood pressure controlled -Continue amlodipine, metoprolol, diuretic  Former history seizures  No longer on AED  Depression Possible dementia -Continue sertraline  GERD -Continue PPI             Disposition: The patient was admitted with dyspnea, tachypnea and SpO2 <90% with EMS and here.  CXR and BNP suggest CHF flare.    Renal function worsening, will need to hold diuretics and losartan and monitor Cr and Hgb.       MDM: The below labs and imaging reports were reviewed and summarized above.  Medication management as above.    DVT prophylaxis: Lovenox, dose reduced Code Status: FULL Family Communication: brother by phone    Consultants:     Procedures:   1/13 echo -- ef 35%  Antimicrobials:   Ctx  azith    Subjective: Misty Berry having sundowning overnight.  Chest pain resolved, still very weak.     Objective: Vitals:   06/28/19 0438 06/28/19 0657 06/28/19 0735 06/28/19 1535  BP: (!) 153/77  (!) 144/68 (!) 135/55  Pulse: 87  71 (!) 56  Resp: 19  19 19   Temp: (!) 97.4 F (36.3 C)  97.7 F (36.5 C) 98.1 F (36.7 C)  TempSrc: Oral  Oral Oral  SpO2: 95%  95% 99%  Weight: 87 kg 86.6 kg    Height:        Intake/Output Summary (Last 24 hours) at 06/28/2019 1846 Last data filed at 06/28/2019 0439 Gross per 24 hour  Intake --  Output 700 ml  Net -700 ml   Filed Weights   06/26/19  2235 06/28/19 0438 06/28/19 0657  Weight: 77.1 kg 87 kg 86.6 kg    Examination: General appearance: BMI 67 adult female, alert and in no acute distress.  Appears very tired. HEENT: One of her eyes is fossae.  Anicteric, conjunctiva pink, lids and lashes normal. No nasal deformity, discharge, epistaxis.  Lips moist, oropharynx tacky dry, no oral lesions, hearing normal.   Skin: Warm and dry.  No jaundice.  No suspicious rashes or lesions. Cardiac: RRR, nl S1-S2, no murmurs appreciated.  Capillary refill is brisk.  JVPnot visible.  2+ LE  edema.  Radial pulses 2+ and symmetric. Respiratory: Normal respiratory rate and rhythm.  Rales in the left base, no wheezing. Abdomen: Abdomen soft.  No TTP or guarding. No ascites, distension, hepatosplenomegaly.   MSK: No deformities or effusions.  Chronic osteoarthritis deformities of the hands. Neuro: Awake and alert.  EOMI, moves all extremities with severe generalized weakness, symmetric strength. Speech fluent.    Psych: Sensorium intact and responding to questions, attention normal. Affect pleasant.  Judgment and insight appear normal.    Data Reviewed: I have personally reviewed following labs and imaging studies:  CBC: Recent Labs  Lab 06/26/19 2237 06/27/19 0456 06/28/19 0400  WBC 7.0 7.0 6.6  HGB 9.2* 8.0* 7.6*  HCT 30.4* 25.6* 24.2*  MCV 91.3 90.5 88.6  PLT 201 183 [id number]   Basic Metabolic Panel: Recent Labs  Lab 06/26/19 2237 06/27/19 0456 06/28/19 0400  NA 141 144 143  K 5.0 4.8 4.6  CL 112* 115* 111  CO2 20* 22 23  GLUCOSE 101* 108* 112*  BUN 47* 49* 50*  CREATININE 2.95* 3.03* 3.31*  CALCIUM 9.0 9.0 9.0   GFR: Estimated Creatinine Clearance: 14 mL/min (A) (by C-G formula based on SCr of 3.31 mg/dL (H)). Liver Function Tests: Recent Labs  Lab 06/26/19 2237 06/27/19 0456  AST 28 16  ALT 14 12  ALKPHOS 69 61  BILITOT 1.1 0.8  PROT 7.1 6.6  ALBUMIN 3.7 3.3*   No results for input(s): LIPASE, AMYLASE in the last 168 hours. No results for input(s): AMMONIA in the last 168 hours. Coagulation Profile: No results for input(s): INR, PROTIME in the last 168 hours. Cardiac Enzymes: No results for input(s): CKTOTAL, CKMB, CKMBINDEX, TROPONINI in the last 168 hours. BNP (last 3 results) No results for input(s): PROBNP in the last 8760 hours. HbA1C: Recent Labs    06/27/19 0456  HGBA1C 5.4   CBG: No results for input(s): GLUCAP in the last 168 hours. Lipid Profile: No results for input(s): CHOL, HDL, LDLCALC, TRIG, CHOLHDL, LDLDIRECT in the last 72  hours. Thyroid Function Tests: Recent Labs    06/27/19 0456  TSH 1.138   Anemia Panel: No results for input(s): VITAMINB12, FOLATE, FERRITIN, TIBC, IRON, RETICCTPCT in the last 72 hours. Urine analysis:    Component Value Date/Time   COLORURINE COLORLESS (A) 06/27/2019 0456   APPEARANCEUR CLEAR (A) 06/27/2019 0456   APPEARANCEUR Clear 04/19/2013 0807   LABSPEC 1.008 06/27/2019 0456   LABSPEC 1.012 04/19/2013 0807   PHURINE 6.0 06/27/2019 0456   GLUCOSEU NEGATIVE 06/27/2019 0456   GLUCOSEU 50 mg/dL 04/19/2013 0807   HGBUR NEGATIVE 06/27/2019 0456   BILIRUBINUR NEGATIVE 06/27/2019 0456   BILIRUBINUR Negative 04/19/2013 0807   KETONESUR NEGATIVE 06/27/2019 0456   PROTEINUR 30 (A) 06/27/2019 0456   NITRITE NEGATIVE 06/27/2019 0456   LEUKOCYTESUR NEGATIVE 06/27/2019 0456   LEUKOCYTESUR Negative 04/19/2013 0807   Sepsis Labs: @LABRCNTIP (procalcitonin:4,lacticacidven:4)  ) Recent Results (from the  past 240 hour(s))  Blood culture (routine x 2)     Status: None (Preliminary result)   Collection Time: 06/27/19 12:47 AM   Specimen: BLOOD  Result Value Ref Range Status   Specimen Description BLOOD BLOOD LEFT FOREARM  Final   Special Requests   Final    BOTTLES DRAWN AEROBIC AND ANAEROBIC Blood Culture results may not be optimal due to an excessive volume of blood received in culture bottles   Culture   Final    NO GROWTH 1 DAY Performed at [facility], [address] [place], [place] [address]    Report Status PENDING  Incomplete  Blood culture (routine x 2)     Status: None (Preliminary result)   Collection Time: 06/27/19 12:47 AM   Specimen: BLOOD  Result Value Ref Range Status   Specimen Description BLOOD BLOOD RIGHT HAND  Final   Special Requests   Final    BOTTLES DRAWN AEROBIC AND ANAEROBIC Blood Culture adequate volume   Culture   Final    NO GROWTH 1 DAY Performed at [facility], [address], [place], [place] [address]    Report Status  PENDING  Incomplete  Respiratory Panel by RT PCR (Flu A&B, Covid) - Nasopharyngeal Swab     Status: None   Collection Time: 06/27/19 12:47 AM   Specimen: Nasopharyngeal Swab  Result Value Ref Range Status   SARS Coronavirus 2 by RT PCR NEGATIVE NEGATIVE Final    Comment: (NOTE) SARS-CoV-2 target nucleic acids are NOT DETECTED. The SARS-CoV-2 RNA is generally detectable in upper respiratoy specimens during the acute phase of infection. The lowest concentration of SARS-CoV-2 viral copies this assay can detect is 131 copies/mL. A negative result does not preclude SARS-Cov-2 infection and should not be used as the sole basis for treatment or other patient management decisions. A negative result may occur with  improper specimen collection/handling, submission of specimen other than nasopharyngeal swab, presence of viral mutation(s) within the areas targeted by this assay, and inadequate number of viral copies (<131 copies/mL). A negative result must be combined with clinical observations, patient history, and epidemiological information. The expected result is Negative. Fact Sheet for Patients:  PinkCheek.be Fact Sheet for Healthcare Providers:  GravelBags.it This test is not yet ap proved or cleared by the [place] FDA and  has been authorized for detection and/or diagnosis of SARS-CoV-2 by FDA under an Emergency Use Authorization (EUA). This EUA will remain  in effect (meaning this test can be used) for the duration of the COVID-19 declaration under Section 564(b)(1) of the Act, 21 U.S.C. section 360bbb-3(b)(1), unless the authorization is terminated or revoked sooner.    Influenza A by PCR NEGATIVE NEGATIVE Final   Influenza B by PCR NEGATIVE NEGATIVE Final    Comment: (NOTE) The Xpert Xpress SARS-CoV-2/FLU/RSV assay is intended as an aid in  the diagnosis of influenza from Nasopharyngeal swab specimens and  should not  be used as a sole basis for treatment. Nasal washings and  aspirates are unacceptable for Xpert Xpress SARS-CoV-2/FLU/RSV  testing. Fact Sheet for Patients: PinkCheek.be Fact Sheet for Healthcare Providers: GravelBags.it This test is not yet approved or cleared by the [place] FDA and  has been authorized for detection and/or diagnosis of SARS-CoV-2 by  FDA under an Emergency Use Authorization (EUA). This EUA will remain  in effect (meaning this test can be used) for the duration of the  Covid-19 declaration under Section 564(b)(1) of the Act, 21  U.S.C.  section 360bbb-3(b)(1), unless the authorization is  terminated or revoked. Performed at [facility], [address] [place], [place] [address]          Radiology Studies: CT CHEST WO CONTRAST  Result Date: 06/27/2019 CLINICAL DATA:  Shortness of breath at rest and with exertion. EXAM: CT CHEST WITHOUT CONTRAST TECHNIQUE: Multidetector CT imaging of the chest was performed following the standard protocol without IV contrast. COMPARISON:  None. FINDINGS: Cardiovascular: Trace anterior pericardial fluid and/or thickening. Overall normal heart size. Atherosclerotic calcification of the aorta and coronaries. Mediastinum/Nodes: Moderate hiatal hernia containing both fat and stomach. No adenopathy, inflammatory changes, or mass. Lungs/Pleura: Dependent pulmonary opacity with volume loss and small pleural effusions. No edema, consolidation, or generalized ground-glass opacity. Upper Abdomen: Partially covered left renal cystic densities. Musculoskeletal: Exaggerated kyphosis with scoliosis and bridging thoracic osteophytes. Healed right posterior rib fractures. Subcutaneous edema along the right flank which appears asymmetric on reformats. Question underlying trauma. IMPRESSION: 1. Dependent atelectasis and small pleural effusions. 2. Aortic and coronary atherosclerosis. 3.  Subcutaneous edema along the right flank could be positional anasarca but is not seen on the left, please correlate for underlying trauma. 4. Multilevel thoracic ankylosis. 5. Hiatal hernia. Electronically Signed   By: Monte Fantasia M.D.   On: 06/27/2019 04:37   DG Chest Portable 1 View  Result Date: 06/26/2019 CLINICAL DATA:  Shortness of breath EXAM: PORTABLE CHEST 1 VIEW COMPARISON:  12/29/2018 FINDINGS: Heart is borderline in size. Patchy bilateral airspace opacities. Moderate-sized hiatal hernia. No effusions. No acute bony abnormality. IMPRESSION: Patchy bilateral airspace opacities concerning for pneumonia. Electronically Signed   By: Rolm Baptise M.D.   On: 06/26/2019 22:53   ECHOCARDIOGRAM COMPLETE  Result Date: 06/27/2019   ECHOCARDIOGRAM REPORT   Patient Name:   MAYBRIE BRISBON Date of Exam: 06/27/2019 Medical Rec #:  [id number]      Height:       64.0 in Accession #:    [id number]     Weight:       170.0 lb Date of Birth:  06-Sep-1934      BSA:          1.83 m Patient Age:    34 years       BP:           186/97 mmHg Patient Gender: F              HR:           80 bpm. Exam Location:  [facility] Procedure: 2D Echo, Color Doppler and Cardiac Doppler Indications:     CHF 428.0  History:         Patient has no prior history of Echocardiogram examinations.                  High blood pressure, hypercholesterolemia.  Sonographer:     Sherrie Sport RDCS (AE) Referring Phys:  [id number] Victoriano Lain A THOMAS Diagnosing Phys: Kate Sable MD  Sonographer Comments: No apical window and no subcostal window. Patient could not lie on left side. IMPRESSIONS  1. Left ventricular ejection fraction, by visual estimation, is 35 to 40%. The left ventricle has moderately decreased function. There is no left ventricular hypertrophy.  2. Left ventricular diastolic function could not be evaluated.  3. The left ventricle demonstrates regional wall motion abnormalities.  4. LV inferior wall hypokinesis.  5. Global right ventricle  was not well visualized.The right ventricular size is not assessed. Right vetricular wall thickness was not  assessed.  6. Left atrial size was normal.  7. Right atrial size was not well visualized.  8. The mitral valve is normal in structure. No evidence of mitral valve regurgitation.  9. The tricuspid valve is grossly normal. 10. The aortic valve is grossly normal. Aortic valve regurgitation is not visualized. 11. Pulmonic regurgitation not assessed. 12. The pulmonic valve was not well visualized. Pulmonic valve regurgitation not assessed. 13. The interatrial septum was not well visualized. FINDINGS  Left Ventricle: Left ventricular ejection fraction, by visual estimation, is 35 to 40%. The left ventricle has moderately decreased function. The left ventricle demonstrates regional wall motion abnormalities. There is no left ventricular hypertrophy. Left ventricular diastolic function could not be evaluated. LV inferior wall hypokinesis. Right Ventricle: The right ventricular size is not assessed. Right vetricular wall thickness was not assessed. Global RV systolic function is was not well visualized. Left Atrium: Left atrial size was normal in size. Right Atrium: Right atrial size was not well visualized Pericardium: There is no evidence of pericardial effusion. Mitral Valve: The mitral valve is normal in structure. No evidence of mitral valve regurgitation. Tricuspid Valve: The tricuspid valve is grossly normal. Tricuspid valve regurgitation is not demonstrated. Aortic Valve: The aortic valve is grossly normal. Aortic valve regurgitation is not visualized. Pulmonic Valve: The pulmonic valve was not well visualized. Pulmonic valve regurgitation not assessed. Pulmonic regurgitation not assessed. Aorta: The aortic root is normal in size and structure. Venous: The inferior vena cava was not well visualized. IAS/Shunts: The interatrial septum was not well visualized.  LEFT VENTRICLE PLAX 2D LVIDd:         4.92 cm LVIDs:          4.09 cm LV PW:         1.33 cm LV IVS:        1.61 cm LVOT diam:     2.10 cm LV SV:         40 ml LV SV Index:   21.22 LVOT Area:     3.46 cm  LEFT ATRIUM         Index LA diam:    5.40 cm 2.96 cm/m                        PULMONIC VALVE AORTA                 RVOT Peak grad: 1 mmHg Ao Root diam: 2.60 cm   SHUNTS Systemic Diam: 2.10 cm  Kate Sable MD Electronically signed by Kate Sable MD Signature Date/Time: 06/27/2019/1:49:04 PM    Final         Scheduled Meds:  amLODipine  2.5 mg Oral Daily   aspirin EC  81 mg Oral Daily   budesonide  0.5 mg Inhalation BID   enoxaparin (LOVENOX) injection  30 mg Subcutaneous Q24H   ipratropium-albuterol  3 mL Nebulization TID   losartan  50 mg Oral Daily   metoprolol succinate  12.5 mg Oral Daily   pantoprazole  40 mg Oral Daily   sertraline  200 mg Oral Daily   sodium chloride flush  10 mL Intravenous Q12H   Continuous Infusions:    LOS: 1 day    Time spent: 25 minutes    Edwin Dada, MD [facility] 06/28/2019, 6:46 PM     Please page though [place] or Epic secure chat:  For [employer], Adult nurse

## 2019-06-28 NOTE — Evaluation (Signed)
Physical Therapy Evaluation Patient Details Name: Misty Berry MRN: [id number] DOB: July 28, 1934 Today's Date: 06/28/2019   History of Present Illness  Pt is an 85 y.o. female presenting to hospital 06/27/19 with SOB at rest and with exertion.  Pt admitted with mild asthma exacerbation with acute hypoxemia and community acquired PNA.  PMH includes hiatal hernia, asthma, gastric reflux, htn, and CKD.  Clinical Impression  Pt seen for PT/OT co-evaluation.  Pt resting in bed upon therapy arrival with pt's roommate present (but was just leaving).  Pt able to perform bed mobility with SBA (pt reports normally sleeping in recliner at home), transfers CGA from bed using RW (min assist standing from toilet but pt reports she has BSC over toilet at home so it is not so low), and CGA ambulating to bathroom and back with RW (20 feet x2).  Decreased cadence noted with ambulation but overall pt steady ambulating with RW.  O2 sats 89% or greater on room air during sessions activities (94-95% at rest end of session).  Some generalized confusion noted during session (may be d/t pt not being in home setting or using her typical DME).  Anticipate pt will be able to progress to performing house hold distances with ambulation with walker use; d/t noted confusion, recommend supervision with mobility for safety.    Follow Up Recommendations Home health PT;Supervision for mobility/OOB    Equipment Recommendations  Rolling walker with 5" wheels;3in1 (PT)    Recommendations for Other Services OT consult     Precautions / Restrictions Precautions Precautions: Fall Restrictions Weight Bearing Restrictions: No      Mobility  Bed Mobility Overal bed mobility: Needs Assistance Bed Mobility: Supine to Sit;Sit to Supine     Supine to sit: Supervision;HOB elevated Sit to supine: Supervision;HOB elevated   General bed mobility comments: increased effort/time for pt to perform on own (pt reports normally sleeping in  recliner)  Transfers Overall transfer level: Needs assistance Equipment used: Rolling walker (2 wheeled) Transfers: Sit to/from [facility] Sit to Stand: Min guard;Min assist Stand pivot transfers: Min guard       General transfer comment: CGA to stand from bed; min assist to stand from hospital toilet (pt normally sits on BSC over toilet); mild increased effort to stand noted; controlled descent sitting noted  Ambulation/Gait Ambulation/Gait assistance: Min guard Gait Distance (Feet): (20 feet x2) Assistive device: Rolling walker (2 wheeled)   Gait velocity: decreased   General Gait Details: decreased B LE step length; steady with RW use  Stairs            Wheelchair Mobility    Modified Rankin (Stroke Patients Only)       Balance Overall balance assessment: Needs assistance Sitting-balance support: No upper extremity supported;Feet supported Sitting balance-Leahy Scale: Good Sitting balance - Comments: steady sitting reaching within BOS     Standing balance-Leahy Scale: Fair Standing balance comment: pt requiring at least single UE support for standing activities                             Pertinent Vitals/Pain Pain Assessment: 0-10 Pain Score: 0-No pain(0/10 at rest; 6/10 with activity) Pain Location: L ribs Pain Descriptors / Indicators: Discomfort;Grimacing;Guarding Pain Intervention(s): Limited activity within patient's tolerance;Monitored during session;Repositioned  HR WFL during sessions activities.    Home Living Family/patient expects to be discharged to:: Private residence Living Arrangements: Non-relatives/Friends(Roommate)   Type of Home: House Home Access: Stairs  to enter   Entrance Stairs-Number of Steps: 3 steps with B railings (unable to reach both; normally holds onto L railing ascending/descending); then flight of stairs into home (has chair lift for these steps) Home Layout: One level Home Equipment:  Walker - 4 wheels;Shower seat;Bedside commode;Grab bars - tub/shower;Crutches Additional Comments: 4ww with seat and 4ww without seat    Prior Function Level of Independence: Independent with assistive device(s)         Comments: Pt reports normally ambulating shorter household distances with 4ww.  Gets groceries delivered.  Sleeps in recliner.  Pt reports no falls in past 6 months.     Hand Dominance        Extremity/Trunk Assessment   Upper Extremity Assessment Upper Extremity Assessment: Defer to OT evaluation    Lower Extremity Assessment Lower Extremity Assessment: Generalized weakness(at least 3/5 AROM B hip flexion, knee flexion/extension, and DF/PF)    Cervical / Trunk Assessment Cervical / Trunk Assessment: (forward head/shoulders)  Communication   Communication: HOH  Cognition Arousal/Alertness: Awake/alert Behavior During Therapy: WFL for tasks assessed/performed Overall Cognitive Status: No family/caregiver present to determine baseline cognitive functioning                                 General Comments: Oriented to person, hospital, and month/year.  Generalized confusion noted.      General Comments   Nursing cleared pt for participation in physical therapy.  Pt agreeable to PT/OT session.  Pt requesting to toilet during session (pt able to manage underwear for toileting and then wash hands at sink without any loss of balance).  After washing her hands at the sink, pt appeared to start to try to sit down between walker (like it had a seat) requiring cueing that this walker did not have a seat (pt reports she is used to having a seat on her walker to sit on as needed)--pt then able to safely take steps with walker to the bed and then safely sit onto bed.    Exercises  Transfers and ambulation.   Assessment/Plan    PT Assessment Patient needs continued PT services  PT Problem List Decreased strength;Decreased activity tolerance;Decreased  balance;Decreased mobility;Decreased knowledge of use of DME;Decreased safety awareness;Decreased knowledge of precautions;Pain       PT Treatment Interventions DME instruction;Gait training;Stair training;Functional mobility training;Therapeutic activities;Therapeutic exercise;Balance training;Patient/family education    PT Goals (Current goals can be found in the Care Plan section)  Acute Rehab PT Goals Patient Stated Goal: to go home PT Goal Formulation: With patient Time For Goal Achievement: 07/12/19 Potential to Achieve Goals: Fair    Frequency Min 2X/week   Barriers to discharge        Co-evaluation PT/OT/SLP Co-Evaluation/Treatment: Yes Reason for Co-Treatment: For patient/therapist safety;To address functional/ADL transfers;Necessary to address cognition/behavior during functional activity PT goals addressed during session: Mobility/safety with mobility;Balance;Proper use of DME OT goals addressed during session: ADL's and self-care;Proper use of Adaptive equipment and DME       AM-PAC PT "6 Clicks" Mobility  Outcome Measure Help needed turning from your back to your side while in a flat bed without using bedrails?: A Little Help needed moving from lying on your back to sitting on the side of a flat bed without using bedrails?: A Little Help needed moving to and from a bed to a chair (including a wheelchair)?: A Little Help needed standing up from a chair using your  arms (e.g., wheelchair or bedside chair)?: A Little Help needed to walk in hospital room?: A Little Help needed climbing 3-5 steps with a railing? : A Lot 6 Click Score: 17    End of Session Equipment Utilized During Treatment: Gait belt Activity Tolerance: Patient limited by fatigue Patient left: in bed;with call bell/phone within reach;with bed alarm set Nurse Communication: Mobility status;Precautions PT Visit Diagnosis: Other abnormalities of gait and mobility (R26.89);Muscle weakness (generalized)  (M62.81);Difficulty in walking, not elsewhere classified (R26.2)    Time: [id number] PT Time Calculation (min) (ACUTE ONLY): 39 min   Charges:   PT Evaluation $PT Eval Low Complexity: 1 Low PT Treatments $Therapeutic Activity: 8-22 mins        Leitha Bleak, PT 06/28/19, 3:49 PM

## 2019-06-28 NOTE — Progress Notes (Signed)
Patient has accused staff of punching her in the arm. Staff has not. Patient has removed iv and is very uncooperative. [name] NP has been contacted about patient's current condition. Will try to place a tele sitter.

## 2019-06-29 ENCOUNTER — Inpatient Hospital Stay: Payer: Medicare Other

## 2019-06-29 LAB — BASIC METABOLIC PANEL
Anion gap: 11 (ref 5–15)
BUN: 57 mg/dL — ABNORMAL HIGH (ref 8–23)
CO2: 21 mmol/L — ABNORMAL LOW (ref 22–32)
Calcium: 8.8 mg/dL — ABNORMAL LOW (ref 8.9–10.3)
Chloride: 110 mmol/L (ref 98–111)
Creatinine, Ser: 3.3 mg/dL — ABNORMAL HIGH (ref 0.44–1.00)
GFR calc Af Amer: 14 mL/min — ABNORMAL LOW (ref >60)
GFR calc non Af Amer: 12 mL/min — ABNORMAL LOW (ref >60)
Glucose, Bld: 88 mg/dL (ref 70–99)
Potassium: 4.2 mmol/L (ref 3.5–5.1)
Sodium: 142 mmol/L (ref 135–145)

## 2019-06-29 LAB — CBC
HCT: 25.1 % — ABNORMAL LOW (ref 36.0–46.0)
Hemoglobin: 7.8 g/dL — ABNORMAL LOW (ref 12.0–15.0)
MCH: 27.8 pg (ref 26.0–34.0)
MCHC: 31.1 g/dL (ref 30.0–36.0)
MCV: 89.3 fL (ref 80.0–100.0)
Platelets: 172 10*3/uL (ref 150–400)
RBC: 2.81 MIL/uL — ABNORMAL LOW (ref 3.87–5.11)
RDW: 16 % — ABNORMAL HIGH (ref 11.5–15.5)
WBC: 6.1 10*3/uL (ref 4.0–10.5)
nRBC: 0 % (ref 0.0–0.2)

## 2019-06-29 LAB — PROCALCITONIN: Procalcitonin: <0.1 ng/mL

## 2019-06-29 MED ORDER — TORSEMIDE 20 MG PO TABS
20.0000 mg | ORAL_TABLET | Freq: Every day | ORAL | Status: DC
  Administered 2019-06-29 – 2019-06-30 (×2): 20 mg via ORAL
  Filled 2019-06-29 (×2): qty 1

## 2019-06-29 NOTE — Care Management Important Message (Signed)
Important Message  Patient Details  Name: Misty TRUSZKOWSKI MRN: [id number] Date of Birth: [birth year]   Medicare Important Message Given:  Yes  Initial Medicare IM given by Patient Access Associate on 06/28/2019 at 9:56am.    Dannette Barbara 06/29/2019, 8:53 AM

## 2019-06-29 NOTE — Progress Notes (Signed)
Occupational Therapy Treatment Patient Details Name: Misty Berry MRN: [id number] DOB: [birth year] Today's Date: 06/29/2019    History of present illness Pt is an 85 y.o. female presenting to hospital 06/27/19 with SOB at rest and with exertion.  Pt admitted with mild asthma exacerbation with acute hypoxemia and community acquired PNA.  PMH includes hiatal hernia, asthma, gastric reflux, htn, and CKD.   OT comments  Pt seen for OT tx this date and noted to be having increased knee pain. Pt requires increased time and assistance for all aspects of treatment. Pt requires MIN/CGA with transfers, MIN A for bed mobility, and CGA for static standing balance while standing sink-side to perform hygiene tasks. Overall, as pt's performance for self care ADLs and ADL mobility appears to wax and wane, OT opted to update d/c recommendation to SNF. Pt would benefit from SNF to restore strength and function to highest attainable level, but would also benefit from 24/7 supv/assistance for safety and fall prevention.   Follow Up Recommendations  SNF    Equipment Recommendations  Other (comment)(defer to next venue of care)    Recommendations for Other Services      Precautions / Restrictions Precautions Precautions: Fall Restrictions Weight Bearing Restrictions: No       Mobility Bed Mobility Overal bed mobility: Needs Assistance Bed Mobility: Supine to Sit;Sit to Supine     Supine to sit: HOB elevated;Min assist Sit to supine: HOB elevated;Min assist   General bed mobility comments: pt requires increased assistance for suup<>sit this date.  Transfers Overall transfer level: Needs assistance Equipment used: Rolling walker (2 wheeled) Transfers: Sit to/from [facility] Sit to Stand: Min guard;Min assist              Balance Overall balance assessment: Needs assistance Sitting-balance support: No upper extremity supported;Feet supported Sitting balance-Leahy Scale:  Good Sitting balance - Comments: steady sitting reaching within BOS     Standing balance-Leahy Scale: Fair Standing balance comment: pt requiring at least single UE support for standing activities, able to alternate for ADLs                           ADL either performed or assessed with clinical judgement   ADL Overall ADL's : Needs assistance/impaired     Grooming: Wash/dry hands;Min guard;Standing Grooming Details (indicate cue type and reason): with RW sink-side                 Toilet Transfer: Min guard;Comfort height toilet;Grab bars;RW [employer] forces technical officer Details (indicate cue type and reason): BSC over standard commode to elevate like her home setup Toileting- Clothing Manipulation and Hygiene: Minimal assistance;Sit to/from stand Toileting - Clothing Manipulation Details (indicate cue type and reason): MIN A for clothing mgt over hips today, pt with c/o increased b/l knee pain     Functional mobility during ADLs: Min guard;Rolling walker(increased assist required as pt with increased c/o knee pain this date and slightly less steady/demos less tolerance for standing.)       Vision Baseline Vision/History: Wears glasses Wears Glasses: At all times Patient Visual Report: No change from baseline     Perception     Praxis      Cognition Arousal/Alertness: Awake/alert Behavior During Therapy: WFL for tasks assessed/performed Overall Cognitive Status: No family/caregiver present to determine baseline cognitive functioning  General Comments: Pt with some increased time for word finding this date, still demos some decreased safety awareness. Alert and oriented to month, year, place and improved understanding of situation this date.        Exercises Other Exercises Other Exercises: OT facilitates education with pt re: d/c recommendations-change to rehab recommendation with waxing/waning physical performance, to  ensure pt with safe return to home environment with limited support. pt agreeable.   Shoulder Instructions       General Comments      Pertinent Vitals/ Pain       Pain Assessment: 0-10 Pain Score: 7  Pain Location: b/l knees Pain Descriptors / Indicators: Aching;Grimacing;Sore Pain Intervention(s): Limited activity within patient's tolerance;Monitored during session  Home Living                                          Prior Functioning/Environment              Frequency  Min 1X/week        Progress Toward Goals  OT Goals(current goals can now be found in the care plan section)  Progress towards OT goals: OT to reassess next treatment(pt with somewhat decreased performance this date potentially d/t knee pain.)  Acute Rehab OT Goals Patient Stated Goal: to go home OT Goal Formulation: With patient Time For Goal Achievement: 07/12/19 Potential to Achieve Goals: Good  Plan Discharge plan needs to be updated    Co-evaluation                 AM-PAC OT "6 Clicks" Daily Activity     Outcome Measure   Help from another person eating meals?: None Help from another person taking care of personal grooming?: A Little Help from another person toileting, which includes using toliet, bedpan, or urinal?: A Little Help from another person bathing (including washing, rinsing, drying)?: A Little Help from another person to put on and taking off regular upper body clothing?: None Help from another person to put on and taking off regular lower body clothing?: A Little 6 Click Score: 20    End of Session Equipment Utilized During Treatment: Gait belt;Rolling walker  OT Visit Diagnosis: Unsteadiness on feet (R26.81);Muscle weakness (generalized) (M62.81)   Activity Tolerance Patient tolerated treatment well   Patient Left in bed;with call bell/phone within reach;with bed alarm set   Nurse Communication Mobility status        Time:  [id number] OT Time Calculation (min): 52 min  Charges: OT General Charges $OT Visit: 1 Visit OT Treatments $Self Care/Home Management : 23-37 mins $Therapeutic Activity: 8-22 mins  Gerrianne Scale, MS, OTR/L ascom [phone] 06/29/19, 1:55 PM

## 2019-06-29 NOTE — TOC Initial Note (Signed)
Transition of Care [facility]) - Initial/Assessment Note    Patient Details  Name: Misty Berry MRN: [id number] Date of Birth: May 06, 1935  [facility] CM/SW Contact:    Victorino Dike, RN Phone Number: 06/29/2019, 3:11 PM  Clinical Narrative:                  Patient clinical status is delirium.  Called Emergency Contact: Brother: Beckie Salts at [phone].   Discussed patient's status and the need OT/PT therapy to assist patient, agreeable to short term rehab.  Will initiate a bed search.  Expected Discharge Plan: Home/Self Care Barriers to Discharge: Continued Medical Work up   Patient Goals and CMS Choice        Expected Discharge Plan and Services Expected Discharge Plan: Home/Self Care   Discharge Planning Services: CM Consult   Living arrangements for the past 2 months: Single Family Home                                      Prior Living Arrangements/Services Living arrangements for the past 2 months: Single Family Home Lives with:: Self Patient language and need for interpreter reviewed:: Yes Do you feel safe going back to the place where you live?: (patient unable to answer at this time)      Need for Family Participation in Patient Care: No (Comment) Care giver support system in place?: Yes (comment)   Criminal Activity/Legal Involvement Pertinent to Current Situation/Hospitalization: No - Comment as needed  Activities of Daily Living Home Assistive Devices/Equipment: Walker (specify type), Eyeglasses ADL Screening (condition at time of admission) Patient's cognitive ability adequate to safely complete daily activities?: Yes Is the patient deaf or have difficulty hearing?: No Does the patient have difficulty seeing, even when wearing glasses/contacts?: No Does the patient have difficulty concentrating, remembering, or making decisions?: No Patient able to express need for assistance with ADLs?: Yes Does the patient have difficulty  dressing or bathing?: No Independently performs ADLs?: Yes (appropriate for developmental age) Does the patient have difficulty walking or climbing stairs?: Yes(uses walker) Weakness of Legs: Both(" a little ") Weakness of Arms/Hands: None  Permission Sought/Granted                  Emotional Assessment       Orientation: : Oriented to Self, Oriented to Situation, Oriented to Place Alcohol / Substance Use: Not Applicable Psych Involvement: No (comment)  Admission diagnosis:  Hypoxemia [R09.02] Dyspnea, unspecified type [R06.00] Community acquired pneumonia, unspecified laterality [J18.9] Acute on chronic systolic (congestive) heart failure ([place]) [I50.23] Patient Active Problem List   Diagnosis Date Noted  . Hypoxemia 06/27/2019  . Acute on chronic systolic (congestive) heart failure ([place]) 06/27/2019  . Acid reflux 08/27/2015  . Cellulitis 08/27/2015  . Abnormal bruising 08/27/2015  . Arthritis, degenerative 08/27/2015  . OP (osteoporosis) 08/27/2015  . Presence of artificial eye 08/27/2015  . History of surgical procedure 08/27/2015  . Hypertensive heart disease with stage 4 chronic kidney disease ([place]) 04/30/2015  . Laceration of forearm without foreign body 04/30/2015  . Venous insufficiency of leg 02/28/2015  . Gluteal tendinitis 01/22/2015  . Degenerative arthritis of lumbar spine 12/12/2014  . Back muscle spasm 11/14/2014  . Degeneration of intervertebral disc of lumbar region 11/14/2014  . Neuritis or radiculitis due to rupture of lumbar intervertebral disc 11/14/2014  . Allergic rhinitis 08/31/2014  . Clinical depression 02/19/2014  .  Gait instability 02/19/2014  . Benign hypertensive heart and kidney disease 10/31/2013  . Left heart failure ([place]) 10/31/2013  . Branch retinal vein occlusion 09/11/2013  . Asthma 06/20/2013  . Essential hypertension 06/20/2013  . Follow-up examination 04/30/2013  . Acute renal failure ([place]) 02/16/2013  . Fall 02/16/2013  .  Closed fracture of part of neck of femur ([place]) 02/16/2013  . Cellulitis and abscess of leg 10/30/2012  . Chronic kidney disease, stage III (moderate) 10/30/2012  . Cellulitis of lower extremity 10/30/2012  . Chronic kidney disease, stage IV (severe) ([place]) 10/30/2012  . Central retinal edema, cystoid 01/31/2012  . Impaired vision 12/17/2011   PCP:  Nelia Shi, MD Pharmacy:   [facility] [place], [place] [facility] [address] AT [address] [facility] [place] [address] Phone: [phone] Fax: [phone]     Social Determinants of Health (SDOH) Interventions    Readmission Risk Interventions No flowsheet data found.

## 2019-06-29 NOTE — NC FL2 (Signed)
[place] LEVEL OF CARE SCREENING TOOL     IDENTIFICATION  Patient Name: Misty Berry Birthdate: [birth year] Sex: female Admission Date (Current Location): 06/26/2019  [place] and [place] Number:  Engineering geologist and Address:  [facility], [address] [place], [place] [address]      Provider Number: [id number]  Attending Physician Name and Address:  Edwin Dada, *  Relative Name and Phone Number:       Current Level of Care: Hospital Recommended Level of Care: [place] Prior Approval Number:    Date Approved/Denied:   PASRR Number: [id number]  Discharge Plan: SNF    Current Diagnoses: Patient Active Problem List   Diagnosis Date Noted  . Hypoxemia 06/27/2019  . Acute on chronic systolic (congestive) heart failure ([place]) 06/27/2019  . Acid reflux 08/27/2015  . Cellulitis 08/27/2015  . Abnormal bruising 08/27/2015  . Arthritis, degenerative 08/27/2015  . OP (osteoporosis) 08/27/2015  . Presence of artificial eye 08/27/2015  . History of surgical procedure 08/27/2015  . Hypertensive heart disease with stage 4 chronic kidney disease ([place]) 04/30/2015  . Laceration of forearm without foreign body 04/30/2015  . Venous insufficiency of leg 02/28/2015  . Gluteal tendinitis 01/22/2015  . Degenerative arthritis of lumbar spine 12/12/2014  . Back muscle spasm 11/14/2014  . Degeneration of intervertebral disc of lumbar region 11/14/2014  . Neuritis or radiculitis due to rupture of lumbar intervertebral disc 11/14/2014  . Allergic rhinitis 08/31/2014  . Clinical depression 02/19/2014  . Gait instability 02/19/2014  . Benign hypertensive heart and kidney disease 10/31/2013  . Left heart failure ([place]) 10/31/2013  . Branch retinal vein occlusion 09/11/2013  . Asthma 06/20/2013  . Essential hypertension 06/20/2013  . Follow-up examination 04/30/2013  . Acute renal failure ([place]) 02/16/2013  . Fall  02/16/2013  . Closed fracture of part of neck of femur ([place]) 02/16/2013  . Cellulitis and abscess of leg 10/30/2012  . Chronic kidney disease, stage III (moderate) 10/30/2012  . Cellulitis of lower extremity 10/30/2012  . Chronic kidney disease, stage IV (severe) ([place]) 10/30/2012  . Central retinal edema, cystoid 01/31/2012  . Impaired vision 12/17/2011    Orientation RESPIRATION BLADDER Height & Weight     Self, Time, Situation, Place  Normal External catheter, Incontinent(06/26/19) Weight: 193 lb 8 oz (87.8 kg) Height:  5\' 4"  (162.6 cm)  BEHAVIORAL SYMPTOMS/MOOD NEUROLOGICAL BOWEL NUTRITION STATUS      Continent Diet(heart healthy/carb modified)  AMBULATORY STATUS COMMUNICATION OF NEEDS Skin   Limited Assist Verbally Normal                       Personal Care Assistance Level of Assistance  Bathing, Dressing, Feeding Bathing Assistance: Limited assistance Feeding assistance: Independent Dressing Assistance: Limited assistance     Functional Limitations Info  Sight, Speech, Hearing Sight Info: Adequate Hearing Info: Adequate Speech Info: Adequate    SPECIAL CARE FACTORS FREQUENCY  PT (By licensed PT), OT (By licensed OT)     PT Frequency: 5x OT Frequency: 5x            Contractures Contractures Info: Not present    Additional Factors Info  Code Status, Allergies Code Status Info: Full Code Allergies Info: Ivp Dye (Iodinated Diagnostic Agents), Lactose, Methylprednisolone, Peanuts (Peanut Oil), Vancomycin, Wellbutrin (Bupropion)           Current Medications (06/29/2019):  This is the current hospital active medication list Current Facility-Administered Medications  Medication Dose  Route Frequency Provider Last Rate Last Admin  . acetaminophen (TYLENOL) tablet 650 mg  650 mg Oral Q6H PRN Clance Boll, MD   650 mg at 06/27/19 0654   Or  . acetaminophen (TYLENOL) suppository 650 mg  650 mg Rectal Q6H PRN Myles Rosenthal A, MD      . albuterol  (PROVENTIL) (2.5 MG/3ML) 0.083% nebulizer solution 2.5 mg  2.5 mg Nebulization Q4H PRN Myles Rosenthal A, MD      . amLODipine (NORVASC) tablet 2.5 mg  2.5 mg Oral Daily Myles Rosenthal A, MD   2.5 mg at 06/29/19 0857  . aspirin EC tablet 81 mg  81 mg Oral Daily Myles Rosenthal A, MD   81 mg at 06/29/19 0857  . budesonide (PULMICORT) nebulizer solution 0.5 mg  0.5 mg Inhalation BID Myles Rosenthal A, MD   0.5 mg at 06/28/19 0801  . enoxaparin (LOVENOX) injection 30 mg  30 mg Subcutaneous Q24H Edwin Dada, MD   30 mg at 06/28/19 2102  . hydrALAZINE (APRESOLINE) tablet 25 mg  25 mg Oral Q6H PRN Danford, Christopher P, MD      . ipratropium-albuterol (DUONEB) 0.5-2.5 (3) MG/3ML nebulizer solution 3 mL  3 mL Nebulization TID Myles Rosenthal A, MD   3 mL at 06/29/19 1403  . metoprolol succinate (TOPROL-XL) 24 hr tablet 12.5 mg  12.5 mg Oral Daily Myles Rosenthal A, MD   12.5 mg at 06/29/19 0858  . ondansetron (ZOFRAN) tablet 4 mg  4 mg Oral Q6H PRN Clance Boll, MD       Or  . ondansetron [facility]) injection 4 mg  4 mg Intravenous Q6H PRN Myles Rosenthal A, MD      . pantoprazole (PROTONIX) EC tablet 40 mg  40 mg Oral Daily Edwin Dada, MD   40 mg at 06/29/19 0858  . sertraline (ZOLOFT) tablet 200 mg  200 mg Oral Daily Edwin Dada, MD   200 mg at 06/29/19 0857  . sodium chloride flush (NS) 0.9 % injection 10 mL  10 mL Intravenous Q12H Danford, Suann Larry, MD   10 mL at 06/29/19 0859  . torsemide (DEMADEX) tablet 20 mg  20 mg Oral Daily Danford, Suann Larry, MD   20 mg at 06/29/19 0857  . traMADol (ULTRAM) tablet 50 mg  50 mg Oral Q6H PRN Edwin Dada, MD   50 mg at 06/27/19 [id number]     Discharge Medications: Please see discharge summary for a list of discharge medications.  Relevant Imaging Results:  Relevant Lab Results:   Additional Information SSN:[id number]  Gerrianne Scale Tahlor Berenguer, LCSW

## 2019-06-29 NOTE — Progress Notes (Signed)
PROGRESS NOTE    Misty Berry  [id number] DOB: [birth year] DOA: 06/26/2019 PCP: Nelia Shi, MD      Brief Narrative:  Mrs. Misty Berry is a 85 y.o. F with asthma, CKD stage IV baseline Cr 2.9-3.5, HTN, mild dementia, sCHF EF 40% 2015, and left eye blindness who presents with sudden onset shortness of breath.  In the ER, chest x-ray showed bilateral opacities.  BNP >1000.  She was breathing 25-30 times per minute, SPO2 was documented in the 80s with EMS.  WBC 7K.  COVID-.          Assessment & Plan:  Acute on chronic systolic and diastolic CHF Acute hypoxic respiratory failure Patient was admitted with CXR showing bibasilar opacities, CT with small effusions, no wheezing, diagnosed with asthma flare, started on inhaled steroids and bronchodilators.  However, it was further noted after continued work up that she had elevated BNP, previously noted impaired EF [id number] and diastolic dysfunction in [id number] at [facility].  Echo report today reviewed, EF today is now down to 35-40%.  Suspect CHF flare   Diuresed with IV Lasix, swelling appears resolved, on room air.   Net negative 650 yesteday, 2L on admission.  Cr trended up.  -Resume home diuretic, torsemide -Strict I/Os, daily weights -Daily monitoring renal function -Continue metoprolol -Hold losartan  Left-sided chest pain This is mostly positional, related to movement.  Also seems to be associated with dyspnea. -Obtain 2 view chest x-ray -Obtain renal ultrasound   Delirium  She has developed an acute metabolic encephalopathy due to medical disease, sundowning.  Improved yesterday during the day, severe and agitated last night.  Delirium precautions:   -Lights and TV off, minimize interruptions at night  -Blinds open and lights on during day  -Glasses/hearing aid with patient  -Frequent reorientation  -PT/OT when able  -Avoid sedation medications/Beers list medications  Doubt asthma exacerbation -Continue  ICS -Continue scheduled bronchodilators  Pneumonia ruled out  Elevated troponin Due to demand ishcemia, myocardial stretch from CHF, no further ischemic work up needed  Anemia of CKD Hgb stable at 7.8.   -Trend CBC -Check type and screen  Chronic kidney disease stage IV Cr stable 3.3 -Close daily monitoring while on IV diuretic -Hold diuretics -Hold Losartan  Hypertension Blood pressure slightly high -Continue amlodipine, metoprolol -Hold diuretic, losartan  Former history seizures  No longer on AED  Depression Possible dementia -Continue sertraline  GERD -Continue PPI             Disposition: Patient was admitted with dyspnea, tachypnea, and hypoxia.  Her chest x-ray showed CHF flare.  She has worsening renal function, continued severe dyspnea, and worsening hemoglobin.  I will discharge when we have a safe discharge plan       MDM: The below labs and imaging reports reviewed and summarized above.  Medication management as above.     DVT prophylaxis: Lovenox, dose reduced Code Status: FULL Family Communication:      Consultants:     Procedures:   1/13 echo -- ef 35%  Antimicrobials:   Ctx  azith    Subjective: Patient having sundowning overnight last night, but today much more alert.  Chest pain has returned, worse with movement.  She has had no fevers, no sputum.  No wheezing.  She is still extremely weak, unable to walk just a few feet to the bathroom.      Objective: Vitals:   06/28/19 1935 06/28/19 1936 06/29/19 0542 06/29/19 0747  BP: 131/70  131/70 (!) 156/80 (!) 153/87  Pulse: (!) 58 (!) 58 65 65  Resp:    19  Temp: 97.8 F (36.6 C) 97.8 F (36.6 C) (!) 97.4 F (36.3 C) (!) 97.5 F (36.4 C)  TempSrc: Oral Oral Oral Oral  SpO2:  99% 92% 93%  Weight:   87.8 kg   Height:        Intake/Output Summary (Last 24 hours) at 06/29/2019 0839 Last data filed at 06/29/2019 0543 Gross per 24 hour  Intake --  Output 650 ml   Net -650 ml   Filed Weights   06/28/19 0438 06/28/19 0657 06/29/19 0542  Weight: 87 kg 86.6 kg 87.8 kg    Examination: General appearance: Overweight adult female, alert and in no acute distress.   HEENT: Anicteric, conjunctiva pink, lids and lashes normal.  Fake left eye.  No nasal deformity, discharge, epistaxis.  Lips moist, teeth normal. OP very dry, no oral lesions.   Skin: Warm and dry.  No suspicious rashes or lesions. Cardiac: RRR, no murmurs appreciated.  1+ bilateral LE edema.    Respiratory: Dyspneic with talking.  Appears dyspneic with minimal exertion.  Lung sounds diminished.  Rales at both bases. Abdomen: Abdomen soft.  No tenderness palpation or guarding. No ascites, distension, hepatosplenomegaly.   MSK: No deformities or effusions of the large joints of the upper or lower extremities bilaterally. Neuro: Awake and alert. Naming is grossly intact, and the patient's recall, recent and remote, as well as general fund of knowledge seem within normal limits.  Muscle tone normal, without fasciculations.  Moves all extremities equally and with normal coordination.  Marland Kitchen Speech fluent.    Psych: Sensorium intact and responding to questions, attention normal. Affect normal.  Judgment and insight appear normal.      Data Reviewed: I have personally reviewed following labs and imaging studies:  CBC: Recent Labs  Lab 06/26/19 2237 06/27/19 0456 06/28/19 0400 06/29/19 0554  WBC 7.0 7.0 6.6 6.1  HGB 9.2* 8.0* 7.6* 7.8*  HCT 30.4* 25.6* 24.2* 25.1*  MCV 91.3 90.5 88.6 89.3  PLT 201 183 184 [id number]   Basic Metabolic Panel: Recent Labs  Lab 06/26/19 2237 06/27/19 0456 06/28/19 0400 06/29/19 0554  NA 141 144 143 142  K 5.0 4.8 4.6 4.2  CL 112* 115* 111 110  CO2 20* 22 23 21*  GLUCOSE 101* 108* 112* 88  BUN 47* 49* 50* 57*  CREATININE 2.95* 3.03* 3.31* 3.30*  CALCIUM 9.0 9.0 9.0 8.8*   GFR: Estimated Creatinine Clearance: 14.1 mL/min (A) (by C-G formula based on SCr of  3.3 mg/dL (H)). Liver Function Tests: Recent Labs  Lab 06/26/19 2237 06/27/19 0456  AST 28 16  ALT 14 12  ALKPHOS 69 61  BILITOT 1.1 0.8  PROT 7.1 6.6  ALBUMIN 3.7 3.3*   No results for input(s): LIPASE, AMYLASE in the last 168 hours. No results for input(s): AMMONIA in the last 168 hours. Coagulation Profile: No results for input(s): INR, PROTIME in the last 168 hours. Cardiac Enzymes: No results for input(s): CKTOTAL, CKMB, CKMBINDEX, TROPONINI in the last 168 hours. BNP (last 3 results) No results for input(s): PROBNP in the last 8760 hours. HbA1C: Recent Labs    06/27/19 0456  HGBA1C 5.4   CBG: No results for input(s): GLUCAP in the last 168 hours. Lipid Profile: No results for input(s): CHOL, HDL, LDLCALC, TRIG, CHOLHDL, LDLDIRECT in the last 72 hours. Thyroid Function Tests: Recent Labs    06/27/19 0456  TSH 1.138   Anemia Panel: No results for input(s): VITAMINB12, FOLATE, FERRITIN, TIBC, IRON, RETICCTPCT in the last 72 hours. Urine analysis:    Component Value Date/Time   COLORURINE COLORLESS (A) 06/27/2019 0456   APPEARANCEUR CLEAR (A) 06/27/2019 0456   APPEARANCEUR Clear 04/19/2013 0807   LABSPEC 1.008 06/27/2019 0456   LABSPEC 1.012 04/19/2013 0807   PHURINE 6.0 06/27/2019 0456   GLUCOSEU NEGATIVE 06/27/2019 0456   GLUCOSEU 50 mg/dL 04/19/2013 0807   HGBUR NEGATIVE 06/27/2019 0456   BILIRUBINUR NEGATIVE 06/27/2019 0456   BILIRUBINUR Negative 04/19/2013 0807   [place] 06/27/2019 0456   PROTEINUR 30 (A) 06/27/2019 0456   NITRITE NEGATIVE 06/27/2019 0456   LEUKOCYTESUR NEGATIVE 06/27/2019 0456   LEUKOCYTESUR Negative 04/19/2013 0807   Sepsis Labs: @LABRCNTIP (procalcitonin:4,lacticacidven:4)  ) Recent Results (from the past 240 hour(s))  Blood culture (routine x 2)     Status: None (Preliminary result)   Collection Time: 06/27/19 12:47 AM   Specimen: BLOOD  Result Value Ref Range Status   Specimen Description BLOOD BLOOD LEFT  FOREARM  Final   Special Requests   Final    BOTTLES DRAWN AEROBIC AND ANAEROBIC Blood Culture results may not be optimal due to an excessive volume of blood received in culture bottles   Culture   Final    NO GROWTH 2 DAYS Performed at [facility], [address] [place], [place] [address]    Report Status PENDING  Incomplete  Blood culture (routine x 2)     Status: None (Preliminary result)   Collection Time: 06/27/19 12:47 AM   Specimen: BLOOD  Result Value Ref Range Status   Specimen Description BLOOD BLOOD RIGHT HAND  Final   Special Requests   Final    BOTTLES DRAWN AEROBIC AND ANAEROBIC Blood Culture adequate volume   Culture   Final    NO GROWTH 2 DAYS Performed at [facility], [address] [place], [place] [address]    Report Status PENDING  Incomplete  Respiratory Panel by RT PCR (Flu A&B, Covid) - Nasopharyngeal Swab     Status: None   Collection Time: 06/27/19 12:47 AM   Specimen: Nasopharyngeal Swab  Result Value Ref Range Status   SARS Coronavirus 2 by RT PCR NEGATIVE NEGATIVE Final    Comment: (NOTE) SARS-CoV-2 target nucleic acids are NOT DETECTED. The SARS-CoV-2 RNA is generally detectable in upper respiratoy specimens during the acute phase of infection. The lowest concentration of SARS-CoV-2 viral copies this assay can detect is 131 copies/mL. A negative result does not preclude SARS-Cov-2 infection and should not be used as the sole basis for treatment or other patient management decisions. A negative result may occur with  improper specimen collection/handling, submission of specimen other than nasopharyngeal swab, presence of viral mutation(s) within the areas targeted by this assay, and inadequate number of viral copies (<131 copies/mL). A negative result must be combined with clinical observations, patient history, and epidemiological information. The expected result is Negative. Fact Sheet for Patients:   PinkCheek.be Fact Sheet for Healthcare Providers:  GravelBags.it This test is not yet ap proved or cleared by the [place] FDA and  has been authorized for detection and/or diagnosis of SARS-CoV-2 by FDA under an Emergency Use Authorization (EUA). This EUA will remain  in effect (meaning this test can be used) for the duration of the COVID-19 declaration under Section 564(b)(1) of the Act, 21 U.S.C. section 360bbb-3(b)(1), unless the authorization is terminated or revoked sooner.  Influenza A by PCR NEGATIVE NEGATIVE Final   Influenza B by PCR NEGATIVE NEGATIVE Final    Comment: (NOTE) The Xpert Xpress SARS-CoV-2/FLU/RSV assay is intended as an aid in  the diagnosis of influenza from Nasopharyngeal swab specimens and  should not be used as a sole basis for treatment. Nasal washings and  aspirates are unacceptable for Xpert Xpress SARS-CoV-2/FLU/RSV  testing. Fact Sheet for Patients: PinkCheek.be Fact Sheet for Healthcare Providers: GravelBags.it This test is not yet approved or cleared by the [place] FDA and  has been authorized for detection and/or diagnosis of SARS-CoV-2 by  FDA under an Emergency Use Authorization (EUA). This EUA will remain  in effect (meaning this test can be used) for the duration of the  Covid-19 declaration under Section 564(b)(1) of the Act, 21  U.S.C. section 360bbb-3(b)(1), unless the authorization is  terminated or revoked. Performed at [facility], [address] [place], [place] [address]          Radiology Studies: ECHOCARDIOGRAM COMPLETE  Result Date: 06/27/2019   ECHOCARDIOGRAM REPORT   Patient Name:   TANYJAH DUNSTON Date of Exam: 06/27/2019 Medical Rec #:  [id number]      Height:       64.0 in Accession #:    [id number]     Weight:       170.0 lb Date of Birth:  30-Jun-1934      BSA:          1.83 m  Patient Age:    69 years       BP:           186/97 mmHg Patient Gender: F              HR:           80 bpm. Exam Location:  [facility] Procedure: 2D Echo, Color Doppler and Cardiac Doppler Indications:     CHF 428.0  History:         Patient has no prior history of Echocardiogram examinations.                  High blood pressure, hypercholesterolemia.  Sonographer:     Sherrie Sport RDCS (AE) Referring Phys:  [id number] Victoriano Lain A THOMAS Diagnosing Phys: Kate Sable MD  Sonographer Comments: No apical window and no subcostal window. Patient could not lie on left side. IMPRESSIONS  1. Left ventricular ejection fraction, by visual estimation, is 35 to 40%. The left ventricle has moderately decreased function. There is no left ventricular hypertrophy.  2. Left ventricular diastolic function could not be evaluated.  3. The left ventricle demonstrates regional wall motion abnormalities.  4. LV inferior wall hypokinesis.  5. Global right ventricle was not well visualized.The right ventricular size is not assessed. Right vetricular wall thickness was not assessed.  6. Left atrial size was normal.  7. Right atrial size was not well visualized.  8. The mitral valve is normal in structure. No evidence of mitral valve regurgitation.  9. The tricuspid valve is grossly normal. 10. The aortic valve is grossly normal. Aortic valve regurgitation is not visualized. 11. Pulmonic regurgitation not assessed. 12. The pulmonic valve was not well visualized. Pulmonic valve regurgitation not assessed. 13. The interatrial septum was not well visualized. FINDINGS  Left Ventricle: Left ventricular ejection fraction, by visual estimation, is 35 to 40%. The left ventricle has moderately decreased function. The left ventricle demonstrates regional wall motion abnormalities. There is no left ventricular hypertrophy. Left ventricular  diastolic function could not be evaluated. LV inferior wall hypokinesis. Right Ventricle: The right ventricular size  is not assessed. Right vetricular wall thickness was not assessed. Global RV systolic function is was not well visualized. Left Atrium: Left atrial size was normal in size. Right Atrium: Right atrial size was not well visualized Pericardium: There is no evidence of pericardial effusion. Mitral Valve: The mitral valve is normal in structure. No evidence of mitral valve regurgitation. Tricuspid Valve: The tricuspid valve is grossly normal. Tricuspid valve regurgitation is not demonstrated. Aortic Valve: The aortic valve is grossly normal. Aortic valve regurgitation is not visualized. Pulmonic Valve: The pulmonic valve was not well visualized. Pulmonic valve regurgitation not assessed. Pulmonic regurgitation not assessed. Aorta: The aortic root is normal in size and structure. Venous: The inferior vena cava was not well visualized. IAS/Shunts: The interatrial septum was not well visualized.  LEFT VENTRICLE PLAX 2D LVIDd:         4.92 cm LVIDs:         4.09 cm LV PW:         1.33 cm LV IVS:        1.61 cm LVOT diam:     2.10 cm LV SV:         40 ml LV SV Index:   21.22 LVOT Area:     3.46 cm  LEFT ATRIUM         Index LA diam:    5.40 cm 2.96 cm/m                        PULMONIC VALVE AORTA                 RVOT Peak grad: 1 mmHg Ao Root diam: 2.60 cm   SHUNTS Systemic Diam: 2.10 cm  Kate Sable MD Electronically signed by Kate Sable MD Signature Date/Time: 06/27/2019/1:49:04 PM    Final         Scheduled Meds: . amLODipine  2.5 mg Oral Daily  . aspirin EC  81 mg Oral Daily  . budesonide  0.5 mg Inhalation BID  . enoxaparin (LOVENOX) injection  30 mg Subcutaneous Q24H  . ipratropium-albuterol  3 mL Nebulization TID  . metoprolol succinate  12.5 mg Oral Daily  . pantoprazole  40 mg Oral Daily  . sertraline  200 mg Oral Daily  . sodium chloride flush  10 mL Intravenous Q12H   Continuous Infusions:    LOS: 2 days    Time spent: 25 minutes    Edwin Dada, MD [facility] 06/29/2019, 8:39 AM     Please page though Pl[place] or Epic secure chat:  For [employer], Adult nurse

## 2019-06-30 LAB — BASIC METABOLIC PANEL
Anion gap: 12 (ref 5–15)
BUN: 63 mg/dL — ABNORMAL HIGH (ref 8–23)
CO2: 21 mmol/L — ABNORMAL LOW (ref 22–32)
Calcium: 8.8 mg/dL — ABNORMAL LOW (ref 8.9–10.3)
Chloride: 107 mmol/L (ref 98–111)
Creatinine, Ser: 3.47 mg/dL — ABNORMAL HIGH (ref 0.44–1.00)
GFR calc Af Amer: 14 mL/min — ABNORMAL LOW (ref >60)
GFR calc non Af Amer: 12 mL/min — ABNORMAL LOW (ref >60)
Glucose, Bld: 84 mg/dL (ref 70–99)
Potassium: 4.4 mmol/L (ref 3.5–5.1)
Sodium: 140 mmol/L (ref 135–145)

## 2019-06-30 LAB — HEMOGLOBIN AND HEMATOCRIT, BLOOD
HCT: 24.7 % — ABNORMAL LOW (ref 36.0–46.0)
Hemoglobin: 7.7 g/dL — ABNORMAL LOW (ref 12.0–15.0)

## 2019-06-30 LAB — PREPARE RBC (CROSSMATCH)

## 2019-06-30 LAB — ABO/RH: ABO/RH(D): O POS

## 2019-06-30 MED ORDER — SODIUM CHLORIDE 0.9% IV SOLUTION: Freq: Once | INTRAVENOUS | Status: DC

## 2019-06-30 MED ORDER — FUROSEMIDE 10 MG/ML IJ SOLN
80.0000 mg | Freq: Once | INTRAMUSCULAR | Status: AC
  Administered 2019-06-30: 19:00:00 80 mg via INTRAVENOUS
  Filled 2019-06-30: qty 8

## 2019-06-30 NOTE — Progress Notes (Signed)
PROGRESS NOTE    Misty Berry  [id number] DOB: 01-Sep-1934 DOA: 06/26/2019 PCP: Nelia Shi, MD      Brief Narrative:  Misty Berry is a 85 y.o. F with asthma, CKD stage IV baseline Cr 2.9-3.5, HTN, mild dementia, sCHF EF 40% 2015, and left eye blindness who presents with sudden onset shortness of breath.  In the ER, chest x-ray showed bilateral opacities.  BNP >1000.  She was breathing 25-30 times per minute, SPO2 was documented in the 80s with EMS.  WBC 7K.  COVID-.          Assessment & Plan:  Acute on chronic systolic and diastolic CHF Acute hypoxic respiratory failure Patient was admitted with CXR showing bibasilar opacities, CT with small effusions, no wheezing, diagnosed with asthma flare, started on inhaled steroids and bronchodilators.  However, it was further noted after continued work up that she had elevated BNP, previously noted impaired EF [id number] and diastolic dysfunction in [id number] at [facility].  Echo repeated EF is now down to 35-40%.  Suspect CHF flare   Started diuresed with IV Lasix, swelling appeared to improve, weaned to room air.  Net negative 2000 cc >> but creatinine bumped up and so diuresis held and net negative 650 cc then net positive 230 yesterday.    -Transfuse 1 unit -Diurese with IV Lasix after, then resume torsmeide tomorrow -Consult Nephrology, appreciate expert cares -Avoid hypotension and nephrotoxins -Strict I/Os, daily weights -Daily monitoring renal function -Continue metoprolol -Hold losartan and amlodipine  Left-sided chest pain This is mostly positional, related to movement.  Also seems to be associated with dyspnea.  Her CXR was obtained yesterday, personally reviewed, shows no fracture, pneumonia, edema or effusion.  Renal ultrasound obtained, and shows no hydronrpheosis or mass.    Delirium  She developed an acute metabolic encephalopathy due to medical disease, sundowning her first two nights.  This has improved  somewhat  Delirium precautions:   -Lights and TV off, minimize interruptions at night  -Blinds open and lights on during day  -Glasses/hearing aid with patient  -Frequent reorientation  -PT/OT when able  -Avoid sedation medications/Beers list medications  Doubt asthma exacerbation -Continue ICS -Continue scheduled bronchodilators  Pneumonia ruled out  Elevated troponin Due to demand ishcemia, myocardial stretch from CHF, no further ischemic work up needed  Anemia of CKD Hgb has trended down <8 g/dL.  In light of her dyspnea making it difficult to get around, as well as her advanced CHF and renal disease, I feel this degree of anemia is contributing to her heart and kidney failure and that she would benefit from transfusion. -Check Iron, B12, folate -Transfuse 1 unit  Chronic kidney disease stage IV Cr trending up to 3.47 -Daily BMP -Consult Nephrology -Hold amlodipine and losartan, avoid hypotension   Hypertension BP controlled -Continue Toprol -Hold diuretic, losartan, amlodipine  Former history seizures  No longer on AED  Depression Possible dementia -Continue sertraline  GERD -Continue PPI            Disposition: The patient was admitted with dyspnea, tachypnea, and hypoxia.  Her chest x-ray showed CHF flare.  We started diuresing, but the patient has had steadily worsening renal function, unfortunately still severe dyspnea, worsening anemia, and persistent swelling.  We will consult nephrology, transfuse blood products, and monitor her renal function with further diuresis.          MDM: The below labs and imaging reports reviewed and summarized above.  Medication management as above.  DVT prophylaxis: Lovenox, dose reduced Code Status: FULL Family Communication: Brother by phone    Consultants:     Procedures:   1/13 echo -- ef 35%  Antimicrobials:   Ctx  azith    Subjective: No further sundowning, she appears more  alert, her mentation seems good.  Her chest pain is gone, she has had no fevers or sputum or wheezing.  But she is still out of breath, and very weak.   No melena or hematochezia.  No dysuria, hematuria, epistaxis.   Objective: Vitals:   06/29/19 1625 06/29/19 1936 06/30/19 0429 06/30/19 0801  BP: 132/60 (!) 145/74 139/60 (!) 145/49  Pulse: (!) 104 (!) 53 66 (!) 49  Resp: 18 18 16 19   Temp: 98 F (36.7 C) 97.7 F (36.5 C) 97.6 F (36.4 C) (!) 97.5 F (36.4 C)  TempSrc: Oral Oral Oral Oral  SpO2: 93% 99% 91% 93%  Weight:   88 kg   Height:        Intake/Output Summary (Last 24 hours) at 06/30/2019 1151 Last data filed at 06/30/2019 0400 Gross per 24 hour  Intake 240 ml  Output 250 ml  Net -10 ml   Filed Weights   06/28/19 0657 06/29/19 0542 06/30/19 0429  Weight: 86.6 kg 87.8 kg 88 kg    Examination: General appearance: Elderly adult female, alert and in no acute distress.   HEENT: Anicteric, conjunctiva pink, lids and lashes normal.  Left eye artificial.  No nasal deformity, discharge, epistaxis.  Lips moist, teeth normal. OP dry, no oral lesions.  Hearing diminished. Skin: Warm and dry.  No suspicious rashes or lesions.  Pale. Cardiac: RRR, no murmurs appreciated.  1+ LE edema.    Respiratory: Normal respiratory rate and rhythm.  No wheezing.  Rales at bilateral bases. Abdomen: Abdomen soft.  No tenderness palpation or guarding. No ascites, distension, hepatosplenomegaly.   MSK: No deformities or effusions of the large joints of the upper or lower extremities bilaterally. Neuro: Awake and alert. Naming is grossly intact, and the patient's recall, recent and remote, as well as general fund of knowledge seem within normal limits.  Muscle tone normal, without fasciculations.  Moves all extremities with severe generalized weakness, but normal coordination, speech fluent.  Psych: Sensorium intact and responding to questions, attention normal. Affect normal.  Judgment and insight  appear normal.        Data Reviewed: I have personally reviewed following labs and imaging studies:  CBC: Recent Labs  Lab 06/26/19 2237 06/27/19 0456 06/28/19 0400 06/29/19 0554  WBC 7.0 7.0 6.6 6.1  HGB 9.2* 8.0* 7.6* 7.8*  HCT 30.4* 25.6* 24.2* 25.1*  MCV 91.3 90.5 88.6 89.3  PLT 201 183 184 [id number]   Basic Metabolic Panel: Recent Labs  Lab 06/26/19 2237 06/27/19 0456 06/28/19 0400 06/29/19 0554 06/30/19 0616  NA 141 144 143 142 140  K 5.0 4.8 4.6 4.2 4.4  CL 112* 115* 111 110 107  CO2 20* 22 23 21* 21*  GLUCOSE 101* 108* 112* 88 84  BUN 47* 49* 50* 57* 63*  CREATININE 2.95* 3.03* 3.31* 3.30* 3.47*  CALCIUM 9.0 9.0 9.0 8.8* 8.8*   GFR: Estimated Creatinine Clearance: 13.4 mL/min (A) (by C-G formula based on SCr of 3.47 mg/dL (H)). Liver Function Tests: Recent Labs  Lab 06/26/19 2237 06/27/19 0456  AST 28 16  ALT 14 12  ALKPHOS 69 61  BILITOT 1.1 0.8  PROT 7.1 6.6  ALBUMIN 3.7 3.3*   No results for  input(s): LIPASE, AMYLASE in the last 168 hours. No results for input(s): AMMONIA in the last 168 hours. Coagulation Profile: No results for input(s): INR, PROTIME in the last 168 hours. Cardiac Enzymes: No results for input(s): CKTOTAL, CKMB, CKMBINDEX, TROPONINI in the last 168 hours. BNP (last 3 results) No results for input(s): PROBNP in the last 8760 hours. HbA1C: No results for input(s): HGBA1C in the last 72 hours. CBG: No results for input(s): GLUCAP in the last 168 hours. Lipid Profile: No results for input(s): CHOL, HDL, LDLCALC, TRIG, CHOLHDL, LDLDIRECT in the last 72 hours. Thyroid Function Tests: No results for input(s): TSH, T4TOTAL, FREET4, T3FREE, THYROIDAB in the last 72 hours. Anemia Panel: No results for input(s): VITAMINB12, FOLATE, FERRITIN, TIBC, IRON, RETICCTPCT in the last 72 hours. Urine analysis:    Component Value Date/Time   COLORURINE COLORLESS (A) 06/27/2019 0456   APPEARANCEUR CLEAR (A) 06/27/2019 0456   APPEARANCEUR  Clear 04/19/2013 0807   LABSPEC 1.008 06/27/2019 0456   LABSPEC 1.012 04/19/2013 0807   PHURINE 6.0 06/27/2019 0456   GLUCOSEU NEGATIVE 06/27/2019 0456   GLUCOSEU 50 mg/dL 04/19/2013 0807   HGBUR NEGATIVE 06/27/2019 0456   BILIRUBINUR NEGATIVE 06/27/2019 0456   BILIRUBINUR Negative 04/19/2013 0807   [place] 06/27/2019 0456   PROTEINUR 30 (A) 06/27/2019 0456   NITRITE NEGATIVE 06/27/2019 0456   LEUKOCYTESUR NEGATIVE 06/27/2019 0456   LEUKOCYTESUR Negative 04/19/2013 0807   Sepsis Labs: @LABRCNTIP (procalcitonin:4,lacticacidven:4)  ) Recent Results (from the past 240 hour(s))  Blood culture (routine x 2)     Status: None (Preliminary result)   Collection Time: 06/27/19 12:47 AM   Specimen: BLOOD  Result Value Ref Range Status   Specimen Description BLOOD BLOOD LEFT FOREARM  Final   Special Requests   Final    BOTTLES DRAWN AEROBIC AND ANAEROBIC Blood Culture results may not be optimal due to an excessive volume of blood received in culture bottles   Culture   Final    NO GROWTH 3 DAYS Performed at [facility], [address] [place], [place] [address]    Report Status PENDING  Incomplete  Blood culture (routine x 2)     Status: None (Preliminary result)   Collection Time: 06/27/19 12:47 AM   Specimen: BLOOD  Result Value Ref Range Status   Specimen Description BLOOD BLOOD RIGHT HAND  Final   Special Requests   Final    BOTTLES DRAWN AEROBIC AND ANAEROBIC Blood Culture adequate volume   Culture   Final    NO GROWTH 3 DAYS Performed at [facility], [address] [place], [place] [address]    Report Status PENDING  Incomplete  Respiratory Panel by RT PCR (Flu A&B, Covid) - Nasopharyngeal Swab     Status: None   Collection Time: 06/27/19 12:47 AM   Specimen: Nasopharyngeal Swab  Result Value Ref Range Status   SARS Coronavirus 2 by RT PCR NEGATIVE NEGATIVE Final    Comment: (NOTE) SARS-CoV-2 target nucleic acids are NOT DETECTED. The  SARS-CoV-2 RNA is generally detectable in upper respiratoy specimens during the acute phase of infection. The lowest concentration of SARS-CoV-2 viral copies this assay can detect is 131 copies/mL. A negative result does not preclude SARS-Cov-2 infection and should not be used as the sole basis for treatment or other patient management decisions. A negative result may occur with  improper specimen collection/handling, submission of specimen other than nasopharyngeal swab, presence of viral mutation(s) within the areas targeted by this assay, and inadequate  number of viral copies (<131 copies/mL). A negative result must be combined with clinical observations, patient history, and epidemiological information. The expected result is Negative. Fact Sheet for Patients:  PinkCheek.be Fact Sheet for Healthcare Providers:  GravelBags.it This test is not yet ap proved or cleared by the [place] FDA and  has been authorized for detection and/or diagnosis of SARS-CoV-2 by FDA under an Emergency Use Authorization (EUA). This EUA will remain  in effect (meaning this test can be used) for the duration of the COVID-19 declaration under Section 564(b)(1) of the Act, 21 U.S.C. section 360bbb-3(b)(1), unless the authorization is terminated or revoked sooner.    Influenza A by PCR NEGATIVE NEGATIVE Final   Influenza B by PCR NEGATIVE NEGATIVE Final    Comment: (NOTE) The Xpert Xpress SARS-CoV-2/FLU/RSV assay is intended as an aid in  the diagnosis of influenza from Nasopharyngeal swab specimens and  should not be used as a sole basis for treatment. Nasal washings and  aspirates are unacceptable for Xpert Xpress SARS-CoV-2/FLU/RSV  testing. Fact Sheet for Patients: PinkCheek.be Fact Sheet for Healthcare Providers: GravelBags.it This test is not yet approved or cleared by the [place] FDA and  has been authorized for detection and/or diagnosis of SARS-CoV-2 by  FDA under an Emergency Use Authorization (EUA). This EUA will remain  in effect (meaning this test can be used) for the duration of the  Covid-19 declaration under Section 564(b)(1) of the Act, 21  U.S.C. section 360bbb-3(b)(1), unless the authorization is  terminated or revoked. Performed at [facility], [address] [place], [place] [address]          Radiology Studies: DG Chest 2 View  Result Date: 06/29/2019 CLINICAL DATA:  Dyspnea EXAM: CHEST - 2 VIEW COMPARISON:  06/26/2019 FINDINGS: Normal cardiac silhouette. Interval improvement in vascular congestion. Small LEFT effusion. No pneumothorax. Bibasilar atelectasis. IMPRESSION: Improvement in venous congestion.  Bibasilar atelectasis Electronically Signed   By: Suzy Bouchard M.D.   On: 06/29/2019 16:01   US RENAL  Result Date: 06/29/2019 CLINICAL DATA:  Rib and flank pain for 1 week. Laterality not specified. EXAM: RENAL / URINARY TRACT ULTRASOUND COMPLETE COMPARISON:  Renal ultrasound 02/13/2013.  Abdominal CT 12/10/2021. FINDINGS: Right Kidney: Renal measurements: Approximately 13.1 x 7.8 x 7.6 cm = volume: 405.2 mL. There are multiple renal cysts as seen on previous studies, largest posteriorly upper pole measuring 4.2 x 6.1 x 5.1 cm. No hydronephrosis. Left Kidney: Renal measurements: Approximately 12.8 x 7.4 x 6.8 cm = volume: 336.2 mL. There are multiple renal cysts, largest in the lower pole measuring 6.6 x 7.6 x 6.4 cm. No hydronephrosis. Bladder: Appears normal for degree of bladder distention. Other: None. IMPRESSION: 1. No acute findings or explanation for the patient's symptoms. No hydronephrosis. 2. Multiple large bilateral renal cysts as seen on previous studies. Electronically Signed   By: Richardean Sale M.D.   On: 06/29/2019 15:36        Scheduled Meds: . amLODipine  2.5 mg Oral Daily  . aspirin EC  81 mg Oral  Daily  . budesonide  0.5 mg Inhalation BID  . enoxaparin (LOVENOX) injection  30 mg Subcutaneous Q24H  . ipratropium-albuterol  3 mL Nebulization TID  . metoprolol succinate  12.5 mg Oral Daily  . pantoprazole  40 mg Oral Daily  . sertraline  200 mg Oral Daily  . sodium chloride flush  10 mL Intravenous Q12H  . torsemide  20 mg Oral Daily   Continuous  Infusions:    LOS: 3 days    Time spent: 35 minutes    Edwin Dada, MD [facility] 06/30/2019, 11:51 AM     Please page though [place] or Epic secure chat:  For [employer], Adult nurse

## 2019-06-30 NOTE — Consult Note (Signed)
[address] [place], [place] [address] Phone [phone]. Fax [phone]  Date: 06/30/2019                  Patient Name:  Misty Berry  MRN: [id number]  DOB: 03-Nov-1934  Age / Sex: 85 y.o., female         PCP: Powell-Tillman, Sara Chu, MD                 Service Requesting Consult: IM/ Edwin Dada, *                 Reason for Consult: ARF            History of Present Illness: Patient is a 85 y.o. caucasian female  was admitted to [facility] on 06/26/2019 for evaluation of Hypoxemia [R09.02] Dyspnea, unspecified type [R06.00] Community acquired pneumonia, unspecified laterality [J18.9] Acute on chronic systolic (congestive) heart failure ([place]) [I50.23]  Patient presented to the emergency room via EMS for acute shortness of breath requiring 6 L oxygen supplementation via nasal cannula.  Patient was noted to have infiltrates on chest x-ray therefore was treated with empiric antibiotics and IV diuretics. nephrology consult has been requested for acute kidney injury on chronic kidney disease   Medications: Outpatient medications: Medications Prior to Admission  Medication Sig Dispense Refill Last Dose  . amLODipine (NORVASC) 2.5 MG tablet Take 2.5 mg by mouth daily.   3 Past Week at Unknown time  . aspirin EC 81 MG tablet Take 81 mg by mouth daily.    Past Week at Unknown time  . calcium-vitamin D (OSCAL WITH D) 500-200 MG-UNIT TABS tablet Take 1 tablet by mouth daily.    Past Week at Unknown time  . clobetasol ointment (TEMOVATE) [id number] % Apply 1 application topically 2 (two) times daily. To shins   Past Week at Unknown time  . esomeprazole (NEXIUM) 40 MG capsule Take 40 mg by mouth daily.    Past Week at Unknown time  . ferrous gluconate (FERGON) 324 MG tablet Take 324 mg by mouth daily.    Past Week at Unknown time  . L-Methylfolate-Algae (DEPLIN 15) 15-90.314 MG CAPS Take 15 mg by mouth daily.    Past Week at Unknown time  . L-Theanine 100 MG CAPS  Take 800 mg by mouth daily.    Past Week at Unknown time  . losartan (COZAAR) 25 MG tablet Take 50 mg by mouth daily.   1 Past Week at Unknown time  . metoprolol succinate (TOPROL-XL) 25 MG 24 hr tablet Take 12.5 mg by mouth daily.   3 Past Week at Unknown time  . MILK THISTLE PO Take 1 tablet by mouth daily.    Past Week at Unknown time  . Multiple Vitamins-Minerals (CENTRUM SILVER ADULT 50+) TABS Take 1 tablet by mouth daily.   Past Week at Unknown time  . Omega-3 Fatty Acids (FISH OIL) 1000 MG CAPS Take 4,000 mg by mouth daily.    Past Week at Unknown time  . S-Adenosylmethionine (SAM-E) 200 MG TABS Take 400 mg by mouth daily.    Past Week at Unknown time  . torsemide (DEMADEX) 20 MG tablet Take 20 mg by mouth daily.   2 Past Week at Unknown time  . vitamin B-12 (CYANOCOBALAMIN) 1000 MCG tablet Take 1,000 mcg by mouth daily.    Past Week at Unknown time  . ZOLOFT 100 MG tablet Take 200 mg by mouth daily.    Past Week at Unknown  time    Current medications: Current Facility-Administered Medications  Medication Dose Route Frequency Provider Last Rate Last Admin  . 0.9 %  sodium chloride infusion (Manually program via Guardrails IV Fluids)   Intravenous Once Danford, Suann Larry, MD      . acetaminophen (TYLENOL) tablet 650 mg  650 mg Oral Q6H PRN Clance Boll, MD   650 mg at 06/27/19 0654   Or  . acetaminophen (TYLENOL) suppository 650 mg  650 mg Rectal Q6H PRN Myles Rosenthal A, MD      . albuterol (PROVENTIL) (2.5 MG/3ML) 0.083% nebulizer solution 2.5 mg  2.5 mg Nebulization Q4H PRN Clance Boll, MD      . aspirin EC tablet 81 mg  81 mg Oral Daily Myles Rosenthal A, MD   81 mg at 06/30/19 [id number]  . budesonide (PULMICORT) nebulizer solution 0.5 mg  0.5 mg Inhalation BID Myles Rosenthal A, MD   0.5 mg at 06/28/19 0801  . enoxaparin (LOVENOX) injection 30 mg  30 mg Subcutaneous Q24H Danford, Suann Larry, MD   30 mg at 06/29/19 2136  . furosemide (LASIX) injection 80 mg   80 mg Intravenous Once Danford, Suann Larry, MD      . hydrALAZINE (APRESOLINE) tablet 25 mg  25 mg Oral Q6H PRN Danford, Suann Larry, MD      . ipratropium-albuterol (DUONEB) 0.5-2.5 (3) MG/3ML nebulizer solution 3 mL  3 mL Nebulization TID Myles Rosenthal A, MD   3 mL at 06/30/19 1331  . metoprolol succinate (TOPROL-XL) 24 hr tablet 12.5 mg  12.5 mg Oral Daily Myles Rosenthal A, MD   12.5 mg at 06/30/19 [id number]  . ondansetron (ZOFRAN) tablet 4 mg  4 mg Oral Q6H PRN Clance Boll, MD       Or  . ondansetron [facility]) injection 4 mg  4 mg Intravenous Q6H PRN Myles Rosenthal A, MD      . pantoprazole (PROTONIX) EC tablet 40 mg  40 mg Oral Daily Edwin Dada, MD   40 mg at 06/30/19 [id number]  . sertraline (ZOLOFT) tablet 200 mg  200 mg Oral Daily Edwin Dada, MD   200 mg at 06/30/19 [id number]  . sodium chloride flush (NS) 0.9 % injection 10 mL  10 mL Intravenous Q12H Edwin Dada, MD   10 mL at 06/29/19 2135  . traMADol (ULTRAM) tablet 50 mg  50 mg Oral Q6H PRN Edwin Dada, MD   50 mg at 06/29/19 2135      Allergies: Allergies  Allergen Reactions  . Ivp Dye [Iodinated Diagnostic Agents] Other (See Comments)  . Lactose   . Methylprednisolone Other (See Comments)    Reaction: Hallucinations and Psychosis  . Peanuts [Peanut Oil] Other (See Comments)    Swelling and vomitting  . Vancomycin Other (See Comments)    Reaction: unknown  . Wellbutrin [Bupropion] Other (See Comments)    Hallucinations      Past Medical History: Past Medical History:  Diagnosis Date  . Asthma   . Bruises easily   . Cellulitis   . Cough   . DDD (degenerative disc disease), lumbar   . GERD (gastroesophageal reflux disease)   . HBP (high blood pressure)   . Hypercholesterolemia   . Hyperparathyroidism ([place])   . Osteoarthritis   . Osteoporosis   . Rash   . Renal disorder    kidney disease  . Swelling      Past Surgical History: Past Surgical History:   Procedure Laterality  Date  . APPENDECTOMY    . BREAST REDUCTION SURGERY    . CHOLECYSTECTOMY    . EYE SURGERY Left   . FEMUR SURGERY    . FOOT SURGERY Right   . GALLBLADDER SURGERY    . LIPECTOMY    . PAROTID GLAND TUMOR EXCISION    . THYROID SURGERY    . TONSILLECTOMY    . TUBAL LIGATION    . VITRECTOMY       Family History: History reviewed. No pertinent family history.   Social History: Social History   Socioeconomic History  . Marital status: Single    Spouse name: Not on file  . Number of children: Not on file  . Years of education: Not on file  . Highest education level: Not on file  Occupational History  . Not on file  Tobacco Use  . Smoking status: Never Smoker  . Smokeless tobacco: Never Used  Substance and Sexual Activity  . Alcohol use: No  . Drug use: No  . Sexual activity: Not on file  Other Topics Concern  . Not on file  Social History Narrative   Lives with room mate   Social Determinants of Health   Financial Resource Strain:   . Difficulty of Paying Living Expenses: Not on file  Food Insecurity:   . Worried About Charity fundraiser in the Last Year: Not on file  . Ran Out of Food in the Last Year: Not on file  Transportation Needs:   . Lack of Transportation (Medical): Not on file  . Lack of Transportation (Non-Medical): Not on file  Physical Activity:   . Days of Exercise per Week: Not on file  . Minutes of Exercise per Session: Not on file  Stress:   . Feeling of Stress : Not on file  Social Connections:   . Frequency of Communication with Friends and Family: Not on file  . Frequency of Social Gatherings with Friends and Family: Not on file  . Attends Religious Services: Not on file  . Active Member of Clubs or Organizations: Not on file  . Attends Archivist Meetings: Not on file  . Marital Status: Not on file  Intimate Partner Violence:   . Fear of Current or Ex-Partner: Not on file  . Emotionally Abused: Not on  file  . Physically Abused: Not on file  . Sexually Abused: Not on file     Review of Systems: Gen: no fever or chills HEENT: No vision or hearing complaints CV: Worsening shortness of breath and leg edema Resp: No cough, no hemoptysis GI: Appetite has been fair GU : Denies any problems with voiding.  Patient has known advanced chronic kidney disease MS: Denies any acute complaints Derm: No complaints  Psych: No complaints Heme: No complaints Neuro: No complaints Endocrine.  No complaints  Vital Signs: Blood pressure 125/61, pulse (!) 56, temperature 97.6 F (36.4 C), temperature source Oral, resp. rate 18, height 5\' 4"  (1.626 m), weight 88 kg, SpO2 94 %.   Intake/Output Summary (Last 24 hours) at 06/30/2019 1735 Last data filed at 06/30/2019 1623 Gross per 24 hour  Intake 265 ml  Output 750 ml  Net -485 ml    Weight trends: Filed Weights   06/28/19 0657 06/29/19 0542 06/30/19 0429  Weight: 86.6 kg 87.8 kg 88 kg    Physical Exam: General:  Elderly female, sitting up in the chair  HEENT  dry oral mucous membranes, anicteric  Lungs:  Mild scattered  rhonchi,   Heart::  Irregular rhythm, no rub  Abdomen:  Soft, nontender  Extremities:  + Peripheral edema  Neurologic:  Alert, able to answer questions  Skin:  No acute rashes    Lab results: Basic Metabolic Panel: Recent Labs  Lab 06/28/19 0400 06/29/19 0554 06/30/19 0616  NA 143 142 140  K 4.6 4.2 4.4  CL 111 110 107  CO2 23 21* 21*  GLUCOSE 112* 88 84  BUN 50* 57* 63*  CREATININE 3.31* 3.30* 3.47*  CALCIUM 9.0 8.8* 8.8*    Liver Function Tests: Recent Labs  Lab 06/27/19 0456  AST 16  ALT 12  ALKPHOS 61  BILITOT 0.8  PROT 6.6  ALBUMIN 3.3*   No results for input(s): LIPASE, AMYLASE in the last 168 hours. No results for input(s): AMMONIA in the last 168 hours.  CBC: Recent Labs  Lab 06/28/19 0400 06/29/19 0554  WBC 6.6 6.1  HGB 7.6* 7.8*  HCT 24.2* 25.1*  MCV 88.6 89.3  PLT 184 172     Cardiac Enzymes: No results for input(s): CKTOTAL, TROPONINI in the last 168 hours.  BNP: Invalid input(s): POCBNP  CBG: No results for input(s): GLUCAP in the last 168 hours.  Microbiology: Recent Results (from the past 720 hour(s))  Blood culture (routine x 2)     Status: None (Preliminary result)   Collection Time: 06/27/19 12:47 AM   Specimen: BLOOD  Result Value Ref Range Status   Specimen Description BLOOD BLOOD LEFT FOREARM  Final   Special Requests   Final    BOTTLES DRAWN AEROBIC AND ANAEROBIC Blood Culture results may not be optimal due to an excessive volume of blood received in culture bottles   Culture   Final    NO GROWTH 3 DAYS Performed at [facility], [address] [place], [place] [address]    Report Status PENDING  Incomplete  Blood culture (routine x 2)     Status: None (Preliminary result)   Collection Time: 06/27/19 12:47 AM   Specimen: BLOOD  Result Value Ref Range Status   Specimen Description BLOOD BLOOD RIGHT HAND  Final   Special Requests   Final    BOTTLES DRAWN AEROBIC AND ANAEROBIC Blood Culture adequate volume   Culture   Final    NO GROWTH 3 DAYS Performed at [facility], [address] [place], [place] [address]    Report Status PENDING  Incomplete  Respiratory Panel by RT PCR (Flu A&B, Covid) - Nasopharyngeal Swab     Status: None   Collection Time: 06/27/19 12:47 AM   Specimen: Nasopharyngeal Swab  Result Value Ref Range Status   SARS Coronavirus 2 by RT PCR NEGATIVE NEGATIVE Final    Comment: (NOTE) SARS-CoV-2 target nucleic acids are NOT DETECTED. The SARS-CoV-2 RNA is generally detectable in upper respiratoy specimens during the acute phase of infection. The lowest concentration of SARS-CoV-2 viral copies this assay can detect is 131 copies/mL. A negative result does not preclude SARS-Cov-2 infection and should not be used as the sole basis for treatment or other patient management decisions. A  negative result may occur with  improper specimen collection/handling, submission of specimen other than nasopharyngeal swab, presence of viral mutation(s) within the areas targeted by this assay, and inadequate number of viral copies (<131 copies/mL). A negative result must be combined with clinical observations, patient history, and epidemiological information. The expected result is Negative. Fact Sheet for Patients:  PinkCheek.be Fact Sheet for Healthcare Providers:  GravelBags.it  This test is not yet ap proved or cleared by the [place] and  has been authorized for detection and/or diagnosis of SARS-CoV-2 by FDA under an Emergency Use Authorization (EUA). This EUA will remain  in effect (meaning this test can be used) for the duration of the COVID-19 declaration under Section 564(b)(1) of the Act, 21 U.S.C. section 360bbb-3(b)(1), unless the authorization is terminated or revoked sooner.    Influenza A by PCR NEGATIVE NEGATIVE Final   Influenza B by PCR NEGATIVE NEGATIVE Final    Comment: (NOTE) The Xpert Xpress SARS-CoV-2/FLU/RSV assay is intended as an aid in  the diagnosis of influenza from Nasopharyngeal swab specimens and  should not be used as a sole basis for treatment. Nasal washings and  aspirates are unacceptable for Xpert Xpress SARS-CoV-2/FLU/RSV  testing. Fact Sheet for Patients: PinkCheek.be Fact Sheet for Healthcare Providers: GravelBags.it This test is not yet approved or cleared by the [place] FDA and  has been authorized for detection and/or diagnosis of SARS-CoV-2 by  FDA under an Emergency Use Authorization (EUA). This EUA will remain  in effect (meaning this test can be used) for the duration of the  Covid-19 declaration under Section 564(b)(1) of the Act, 21  U.S.C. section 360bbb-3(b)(1), unless the authorization is   terminated or revoked. Performed at [facility], [address] [place], [place] [address]      Coagulation Studies: No results for input(s): LABPROT, INR in the last 72 hours.  Urinalysis: No results for input(s): COLORURINE, LABSPEC, PHURINE, GLUCOSEU, HGBUR, BILIRUBINUR, KETONESUR, PROTEINUR, UROBILINOGEN, NITRITE, LEUKOCYTESUR in the last 72 hours.  Invalid input(s): APPERANCEUR      Imaging: DG Chest 2 View  Result Date: 06/29/2019 CLINICAL DATA:  Dyspnea EXAM: CHEST - 2 VIEW COMPARISON:  06/26/2019 FINDINGS: Normal cardiac silhouette. Interval improvement in vascular congestion. Small LEFT effusion. No pneumothorax. Bibasilar atelectasis. IMPRESSION: Improvement in venous congestion.  Bibasilar atelectasis Electronically Signed   By: Suzy Bouchard M.D.   On: 06/29/2019 16:01   US RENAL  Result Date: 06/29/2019 CLINICAL DATA:  Rib and flank pain for 1 week. Laterality not specified. EXAM: RENAL / URINARY TRACT ULTRASOUND COMPLETE COMPARISON:  Renal ultrasound 02/13/2013.  Abdominal CT 12/10/2021. FINDINGS: Right Kidney: Renal measurements: Approximately 13.1 x 7.8 x 7.6 cm = volume: 405.2 mL. There are multiple renal cysts as seen on previous studies, largest posteriorly upper pole measuring 4.2 x 6.1 x 5.1 cm. No hydronephrosis. Left Kidney: Renal measurements: Approximately 12.8 x 7.4 x 6.8 cm = volume: 336.2 mL. There are multiple renal cysts, largest in the lower pole measuring 6.6 x 7.6 x 6.4 cm. No hydronephrosis. Bladder: Appears normal for degree of bladder distention. Other: None. IMPRESSION: 1. No acute findings or explanation for the patient's symptoms. No hydronephrosis. 2. Multiple large bilateral renal cysts as seen on previous studies. Electronically Signed   By: Richardean Sale M.D.   On: 06/29/2019 15:36      Assessment & Plan: Pt is a 85 y.o. Caucasian female with hypertension, chronic systolic CHF, chronic kidney disease stage IV, asthma, GERD,  hyperlipidemia, cognitive deficit, "? H/o Head Bleed" was admitted on 06/26/2019 with shortness of breath and edema.   #Acute kidney injury Urinalysis negative for blood.  Minimal protein Renal ultrasound from January 15 shows multiple large bilateral renal cysts, no hydronephrosis or masses Acute kidney injury is likely secondary to cardiorenal syndrome Patient is scheduled for blood transfusion for hemoglobin of 7.8.  Hopefully improvement in anemia will  help perfusion Avoid hypotension, nephrotoxins including nonsteroidals, IV contrast We will follow closely.  No acute indication for dialysis at present  #Chronic kidney disease stage IV/V Baseline creatinine appears to be 2.95/GFR 14 from June 26, 2019 Underlying CKD is likely secondary to hypertension and atherosclerosis Patient is followed by [facility] nephrology as outpatient.  Last seen by Dr. Jennefer Bravo in March 2020      LOS: [place] 1/16/20215:35 PM    Note: This note was prepared with Dragon dictation. Any transcription errors are unintentional

## 2019-06-30 NOTE — Progress Notes (Signed)
Physical Therapy Treatment Patient Details Name: Misty Berry MRN: [id number] DOB: September 01, 1934 Today's Date: 06/30/2019    History of Present Illness Pt is an 85 y.o. female presenting to hospital 06/27/19 with SOB at rest and with exertion.  Pt admitted with mild asthma exacerbation with acute hypoxemia and community acquired PNA.  PMH includes hiatal hernia, asthma, gastric reflux, htn, and CKD.    PT Comments    Pt in bathroom with tech upon arrival.  Ambulated out of bathroom with RW and min guard and requested to rest on bed before gait.  After long seated rest, she was ready to walk.  Stood and walked out of room to desk and voiced her intention of walking to end of hallway and then back to room.  Pt visibly tired after making it just past the door.  Upon questioning she agreed that she overestimated her ability and that turning back was a wise choice.  She struggled but was able to make it back to EOB to rest before finishing walk to recliner for lunch.  HR and O2 parameters during gait.    Discussed at length regarding discharge plan.  She has been to SNF in the past and feels she can progress as well on her own at home with HHPT services.  Gait is limited and she fatigues quickly with tasks but is steady without LOB or safety issues noted.  Education provided and she continues to remain firm in going home.  While SNF would be of benefit for pt she is able to transfer and walk short household distances with min guard.  Will leave discharge recommendations as initially recommended.    Follow Up Recommendations  Home health PT;Supervision for mobility/OOB     Equipment Recommendations  Rolling walker with 5" wheels;3in1 (PT)    Recommendations for Other Services       Precautions / Restrictions Precautions Precautions: Fall Restrictions Weight Bearing Restrictions: No    Mobility  Bed Mobility Overal bed mobility: Needs Assistance             General bed mobility  comments: upon commode upon arrival and remained in recliner after session.  Transfers Overall transfer level: Needs assistance Equipment used: Rolling walker (2 wheeled)   Sit to Stand: Min guard;Min assist            Ambulation/Gait Ambulation/Gait assistance: Min guard Gait Distance (Feet): 45 Feet Assistive device: Rolling walker (2 wheeled) Gait Pattern/deviations: Step-to pattern Gait velocity: decreased   General Gait Details: decreased B LE step length; steady with RW use   Stairs             Wheelchair Mobility    Modified Rankin (Stroke Patients Only)       Balance Overall balance assessment: Needs assistance Sitting-balance support: No upper extremity supported;Feet supported Sitting balance-Leahy Scale: Good     Standing balance support: Bilateral upper extremity supported Standing balance-Leahy Scale: Fair                              Cognition Arousal/Alertness: Awake/alert Behavior During Therapy: WFL for tasks assessed/performed Overall Cognitive Status: Within Functional Limits for tasks assessed                                 General Comments: very talkative and cues for re-direction.      Exercises  General Comments        Pertinent Vitals/Pain Pain Assessment: Faces Faces Pain Scale: Hurts a little bit Pain Location: b/l knees Pain Descriptors / Indicators: Aching;Grimacing;Sore Pain Intervention(s): Limited activity within patient's tolerance;Monitored during session    Home Living                      Prior Function            PT Goals (current goals can now be found in the care plan section) Progress towards PT goals: Progressing toward goals    Frequency    Min 2X/week      PT Plan Current plan remains appropriate    Co-evaluation              AM-PAC PT "6 Clicks" Mobility   Outcome Measure  Help needed turning from your back to your side while in a flat  bed without using bedrails?: A Little Help needed moving from lying on your back to sitting on the side of a flat bed without using bedrails?: A Little Help needed moving to and from a bed to a chair (including a wheelchair)?: A Little Help needed standing up from a chair using your arms (e.g., wheelchair or bedside chair)?: A Little Help needed to walk in hospital room?: A Little Help needed climbing 3-5 steps with a railing? : A Lot 6 Click Score: 17    End of Session Equipment Utilized During Treatment: Gait belt Activity Tolerance: Patient limited by fatigue Patient left: in chair;with chair alarm set;with call bell/phone within reach Nurse Communication: Mobility status       Time: 1110-1135 PT Time Calculation (min) (ACUTE ONLY): 25 min  Charges:  $Gait Training: 23-37 mins                    Chesley Noon, PTA 06/30/19, 12:16 PM

## 2019-06-30 NOTE — Progress Notes (Signed)
Gave report to [place] at [facility]. Discharge paperwork is printed and is in discharge packet.

## 2019-07-01 DIAGNOSIS — N184 Chronic kidney disease, stage 4 (severe): Secondary | ICD-10-CM

## 2019-07-01 DIAGNOSIS — I5023 Acute on chronic systolic (congestive) heart failure: Secondary | ICD-10-CM

## 2019-07-01 DIAGNOSIS — R0600 Dyspnea, unspecified: Secondary | ICD-10-CM

## 2019-07-01 LAB — TYPE AND SCREEN
ABO/RH(D): O POS
Antibody Screen: NEGATIVE
Unit division: 0

## 2019-07-01 LAB — IRON AND TIBC
Iron: 42 ug/dL (ref 28–170)
Saturation Ratios: 12 % (ref 10.4–31.8)
TIBC: 350 ug/dL (ref 250–450)
UIBC: 308 ug/dL

## 2019-07-01 LAB — CBC
HCT: 25.3 % — ABNORMAL LOW (ref 36.0–46.0)
Hemoglobin: 8 g/dL — ABNORMAL LOW (ref 12.0–15.0)
MCH: 27.9 pg (ref 26.0–34.0)
MCHC: 31.6 g/dL (ref 30.0–36.0)
MCV: 88.2 fL (ref 80.0–100.0)
Platelets: 179 10*3/uL (ref 150–400)
RBC: 2.87 MIL/uL — ABNORMAL LOW (ref 3.87–5.11)
RDW: 15.9 % — ABNORMAL HIGH (ref 11.5–15.5)
WBC: 5.3 10*3/uL (ref 4.0–10.5)
nRBC: 0 % (ref 0.0–0.2)

## 2019-07-01 LAB — BASIC METABOLIC PANEL
Anion gap: 10 (ref 5–15)
BUN: 71 mg/dL — ABNORMAL HIGH (ref 8–23)
CO2: 22 mmol/L (ref 22–32)
Calcium: 8.6 mg/dL — ABNORMAL LOW (ref 8.9–10.3)
Chloride: 108 mmol/L (ref 98–111)
Creatinine, Ser: 3.86 mg/dL — ABNORMAL HIGH (ref 0.44–1.00)
GFR calc Af Amer: 12 mL/min — ABNORMAL LOW (ref >60)
GFR calc non Af Amer: 10 mL/min — ABNORMAL LOW (ref >60)
Glucose, Bld: 90 mg/dL (ref 70–99)
Potassium: 4.1 mmol/L (ref 3.5–5.1)
Sodium: 140 mmol/L (ref 135–145)

## 2019-07-01 LAB — FERRITIN: Ferritin: 27 ng/mL (ref 11–307)

## 2019-07-01 LAB — BPAM RBC
Blood Product Expiration Date: 202101182359
ISSUE DATE / TIME: 202101161426
Unit Type and Rh: 9500

## 2019-07-01 LAB — VITAMIN B12: Vitamin B-12: 434 pg/mL (ref 180–914)

## 2019-07-01 MED ORDER — DOCUSATE SODIUM 100 MG PO CAPS
100.0000 mg | ORAL_CAPSULE | Freq: Two times a day (BID) | ORAL | Status: DC
  Administered 2019-07-01 – 2019-07-02 (×2): 100 mg via ORAL
  Filled 2019-07-01 (×2): qty 1

## 2019-07-01 MED ORDER — METOPROLOL SUCCINATE ER 25 MG PO TB24
25.0000 mg | ORAL_TABLET | Freq: Every day | ORAL | Status: DC
  Administered 2019-07-02: 25 mg via ORAL
  Filled 2019-07-01: qty 1

## 2019-07-01 MED ORDER — POLYETHYLENE GLYCOL 3350 17 G PO PACK
17.0000 g | PACK | Freq: Every day | ORAL | Status: DC
  Filled 2019-07-01: qty 1

## 2019-07-01 MED ORDER — MAGNESIUM HYDROXIDE 400 MG/5ML PO SUSP
30.0000 mL | Freq: Once | ORAL | Status: AC
  Administered 2019-07-01: 23:00:00 30 mL via ORAL
  Filled 2019-07-01: qty 30

## 2019-07-01 NOTE — Progress Notes (Signed)
Misty Berry, Misty Berry 07/01/19  Subjective:   Hospital day # 4  Patient is doing fair. Currently on room air Slight increase in serum creatinine is noted  Renal: 01/16 0701 - 01/17 0700 In: 265 [Blood:265] Out: 2000 [Urine:2000] Lab Results  Component Value Date   CREATININE 3.86 (H) 07/01/2019   CREATININE 3.47 (H) 06/30/2019   CREATININE 3.30 (H) 06/29/2019     Objective:  Vital signs in last 24 hours:  Temp:  [97.5 F (36.4 C)-98.2 F (36.8 C)] 98 F (36.7 C) (01/17 0800) Pulse Rate:  [47-101] 84 (01/17 0800) Resp:  [16-20] 16 (01/17 0800) BP: (118-138)/(49-96) 118/75 (01/17 0800) SpO2:  [93 %-97 %] 94 % (01/17 0800) Weight:  [84.6 kg] 84.6 kg (01/17 0425)  Weight change: -3.398 kg Filed Weights   06/29/19 0542 06/30/19 0429 07/01/19 0425  Weight: 87.8 kg 88 kg 84.6 kg    Intake/Output:    Intake/Output Summary (Last 24 hours) at 07/01/2019 1332 Last data filed at 07/01/2019 1151 Gross per 24 hour  Intake 265 ml  Output 2300 ml  Net -2035 ml    Physical Exam: General:  Elderly female, laying in the bed  HEENT  dry oral mucous membranes, anicteric  Lungs:  Mild scattered rhonchi,   Heart::  Irregular rhythm, no rub  Abdomen:  Soft, nontender  Extremities:  + Peripheral edema  Neurologic:  Alert, able to answer questions  Skin:  No acute rashes     Basic Metabolic Panel:  Recent Labs  Lab 06/27/19 0456 06/27/19 0456 06/28/19 0400 06/28/19 0400 06/29/19 0554 06/30/19 0616 07/01/19 0526  NA 144  --  143  --  142 140 140  K 4.8  --  4.6  --  4.2 4.4 4.1  CL 115*  --  111  --  110 107 108  CO2 22  --  23  --  21* 21* 22  GLUCOSE 108*  --  112*  --  88 84 90  BUN 49*  --  50*  --  57* 63* 71*  CREATININE 3.03*  --  3.31*  --  3.30* 3.47* 3.86*  CALCIUM 9.0   < > 9.0   < > 8.8* 8.8* 8.6*   < > = values in this interval not displayed.     CBC: Recent Labs  Lab 06/26/19 2237 06/26/19 2237 06/27/19 0456  06/28/19 0400 06/29/19 0554 06/30/19 1929 07/01/19 0526  WBC 7.0  --  7.0 6.6 6.1  --  5.3  HGB 9.2*   < > 8.0* 7.6* 7.8* 7.7* 8.0*  HCT 30.4*   < > 25.6* 24.2* 25.1* 24.7* 25.3*  MCV 91.3  --  90.5 88.6 89.3  --  88.2  PLT 201  --  183 184 172  --  179   < > = values in this interval not displayed.     No results found for: HEPBSAG, HEPBSAB, HEPBIGM    Microbiology:  Recent Results (from the past 240 hour(s))  Blood culture (routine x 2)     Status: None (Preliminary result)   Collection Time: 06/27/19 12:47 AM   Specimen: BLOOD  Result Value Ref Range Status   Specimen Description BLOOD BLOOD LEFT FOREARM  Final   Special Requests   Final    BOTTLES DRAWN AEROBIC AND ANAEROBIC Blood Culture results may not be optimal due to an excessive volume of blood received in culture bottles   Culture   Final    NO GROWTH  4 DAYS Performed at [facility], [address] [place], [place] [address]    Report Status PENDING  Incomplete  Blood culture (routine x 2)     Status: None (Preliminary result)   Collection Time: 06/27/19 12:47 AM   Specimen: BLOOD  Result Value Ref Range Status   Specimen Description BLOOD BLOOD RIGHT HAND  Final   Special Requests   Final    BOTTLES DRAWN AEROBIC AND ANAEROBIC Blood Culture adequate volume   Culture   Final    NO GROWTH 4 DAYS Performed at [facility], [address], [place], [place] [address]    Report Status PENDING  Incomplete  Respiratory Panel by RT PCR (Flu A&B, Covid) - Nasopharyngeal Swab     Status: None   Collection Time: 06/27/19 12:47 AM   Specimen: Nasopharyngeal Swab  Result Value Ref Range Status   SARS Coronavirus 2 by RT PCR NEGATIVE NEGATIVE Final    Comment: (NOTE) SARS-CoV-2 target nucleic acids are NOT DETECTED. The SARS-CoV-2 RNA is generally detectable in upper respiratoy specimens during the acute phase of infection. The lowest concentration of SARS-CoV-2 viral copies this assay can  detect is 131 copies/mL. A negative result does not preclude SARS-Cov-2 infection and should not be used as the sole basis for treatment or other patient management decisions. A negative result may occur with  improper specimen collection/handling, submission of specimen other than nasopharyngeal swab, presence of viral mutation(s) within the areas targeted by this assay, and inadequate number of viral copies (<131 copies/mL). A negative result must be combined with clinical observations, patient history, and epidemiological information. The expected result is Negative. Fact Sheet for Patients:  PinkCheek.be Fact Sheet for Healthcare Providers:  GravelBags.it This test is not yet ap proved or cleared by the [place] FDA and  has been authorized for detection and/or diagnosis of SARS-CoV-2 by FDA under an Emergency Use Authorization (EUA). This EUA will remain  in effect (meaning this test can be used) for the duration of the COVID-19 declaration under Section 564(b)(1) of the Act, 21 U.S.C. section 360bbb-3(b)(1), unless the authorization is terminated or revoked sooner.    Influenza A by PCR NEGATIVE NEGATIVE Final   Influenza B by PCR NEGATIVE NEGATIVE Final    Comment: (NOTE) The Xpert Xpress SARS-CoV-2/FLU/RSV assay is intended as an aid in  the diagnosis of influenza from Nasopharyngeal swab specimens and  should not be used as a sole basis for treatment. Nasal washings and  aspirates are unacceptable for Xpert Xpress SARS-CoV-2/FLU/RSV  testing. Fact Sheet for Patients: PinkCheek.be Fact Sheet for Healthcare Providers: GravelBags.it This test is not yet approved or cleared by the [place] FDA and  has been authorized for detection and/or diagnosis of SARS-CoV-2 by  FDA under an Emergency Use Authorization (EUA). This EUA will remain  in effect (meaning  this test can be used) for the duration of the  Covid-19 declaration under Section 564(b)(1) of the Act, 21  U.S.C. section 360bbb-3(b)(1), unless the authorization is  terminated or revoked. Performed at [facility], [address] [place], [place] [address]     Coagulation Studies: No results for input(s): LABPROT, INR in the last 72 hours.  Urinalysis: No results for input(s): COLORURINE, LABSPEC, PHURINE, GLUCOSEU, HGBUR, BILIRUBINUR, KETONESUR, PROTEINUR, UROBILINOGEN, NITRITE, LEUKOCYTESUR in the last 72 hours.  Invalid input(s): APPERANCEUR    Imaging: DG Chest 2 View  Result Date: 06/29/2019 CLINICAL DATA:  Dyspnea EXAM: CHEST - 2 VIEW COMPARISON:  06/26/2019 FINDINGS: Normal cardiac silhouette. Interval improvement in vascular congestion. Small LEFT effusion. No pneumothorax. Bibasilar atelectasis. IMPRESSION: Improvement in venous congestion.  Bibasilar atelectasis Electronically Signed   By: Suzy Bouchard M.D.   On: 06/29/2019 16:01   US RENAL  Result Date: 06/29/2019 CLINICAL DATA:  Rib and flank pain for 1 week. Laterality not specified. EXAM: RENAL / URINARY TRACT ULTRASOUND COMPLETE COMPARISON:  Renal ultrasound 02/13/2013.  Abdominal CT 12/10/2021. FINDINGS: Right Kidney: Renal measurements: Approximately 13.1 x 7.8 x 7.6 cm = volume: 405.2 mL. There are multiple renal cysts as seen on previous studies, largest posteriorly upper pole measuring 4.2 x 6.1 x 5.1 cm. No hydronephrosis. Left Kidney: Renal measurements: Approximately 12.8 x 7.4 x 6.8 cm = volume: 336.2 mL. There are multiple renal cysts, largest in the lower pole measuring 6.6 x 7.6 x 6.4 cm. No hydronephrosis. Bladder: Appears normal for degree of bladder distention. Other: None. IMPRESSION: 1. No acute findings or explanation for the patient's symptoms. No hydronephrosis. 2. Multiple large bilateral renal cysts as seen on previous studies. Electronically Signed   By: Richardean Sale M.D.   On:  06/29/2019 15:36     Medications:    . sodium chloride   Intravenous Once  . aspirin EC  81 mg Oral Daily  . budesonide  0.5 mg Inhalation BID  . enoxaparin (LOVENOX) injection  30 mg Subcutaneous Q24H  . ipratropium-albuterol  3 mL Nebulization TID  . [START ON 07/02/2019] metoprolol succinate  25 mg Oral Daily  . pantoprazole  40 mg Oral Daily  . sertraline  200 mg Oral Daily  . sodium chloride flush  10 mL Intravenous Q12H   acetaminophen **OR** acetaminophen, albuterol, hydrALAZINE, ondansetron **OR** ondansetron (ZOFRAN) IV, traMADol  Assessment/ Plan:  85 y.o. female with hypertension, chronic systolic CHF, chronic kidney disease stage IV, asthma, GERD, hyperlipidemia, cognitive deficit,  history of bifrontal subdural hemorrhages  admitted on 06/26/2019 for: Hypoxemia [R09.02] Dyspnea, unspecified type [R06.00] Community acquired pneumonia, unspecified laterality [J18.9] Acute on chronic systolic (congestive) heart failure (HCC) [I50.23]  #Acute kidney injury Urinalysis negative for blood.  Minimal protein Renal ultrasound from January 15 shows multiple large bilateral renal cysts, no hydronephrosis or masses Acute kidney injury is likely secondary to cardiorenal syndrome Maintain hemodynamic stability Avoid hypotension, nephrotoxins including nonsteroidals, IV contrast We will follow closely.  No acute indication for dialysis at present  #Chronic kidney disease stage IV/V Baseline creatinine appears to be 2.95/GFR 14 from June 26, 2019 Underlying CKD is likely secondary to hypertension and atherosclerosis Patient is followed by [facility] nephrology as outpatient.  Last seen by Dr.  Smith Mince in March 2020 Cardiology team planning stress test on Monday    LOS: [place] 1/17/20211:32 PM  [place], [place]  Note: This note was prepared with Dragon dictation. Any transcription errors are unintentional

## 2019-07-01 NOTE — Progress Notes (Signed)
PROGRESS NOTE    Misty Berry  [id number] DOB: May 31, 1935 DOA: 06/26/2019 PCP: Nelia Shi, MD      Brief Narrative:  Misty Berry is a 85 y.o. F with asthma, CKD stage IV baseline Cr 2.9-3.5, HTN, mild dementia, sCHF EF 40% 2015, and left eye blindness who presents with sudden onset shortness of breath.  In the ER, chest x-ray showed bilateral opacities.  BNP >1000.  She was breathing 25-30 times per minute, SPO2 was documented in the 80s with EMS.  WBC 7K.  COVID-.          Assessment & Plan:  Acute on chronic systolic and diastolic CHF Acute hypoxic respiratory failure Patient was admitted with CXR showing bibasilar opacities, CT with small effusions, no wheezing, diagnosed with asthma flare, started on inhaled steroids and bronchodilators.  However, it was further noted after continued work up that she had elevated BNP, previously noted impaired EF [id number] and diastolic dysfunction in [id number] at [facility].  Echo repeated EF is now down to 35-40%.  Suspect CHF flare   Started diuresed with IV Lasix, swelling appeared to improve, weaned to room air.  Net negative 2000 cc >> but creatinine bumped up and so diuresis held and net negative 650 cc then net positive 230 yesterday.    Net negative 1.7L yesterday, Cr up slightly again.    -Hold diuretic  -Consult Nephrology, appreciate expert cares -Consult Cardiology re: possible cardiorenal syndrome  -Avoid hypotension and nephrotoxins -Strict I/Os, daily weights -Daily monitoring renal function -Continue metoprolol -Hold losartan and amlodipine    Delirium  She developed an acute metabolic encephalopathy due to medical disease, sundowning, during her first two nights in the hospital.  This has improved, although she remains weak and unable to care for self at home  Delirium precautions:   -Lights and TV off, minimize interruptions at night  -Blinds open and lights on during day  -Glasses/hearing aid with  patient  -Frequent reorientation  -PT/OT when able  -Avoid sedation medications/Beers list medications  Asthma without exacerbation No wheezing -Continue ICS -Continue scheduled bronchodilators  Pneumonia ruled out  Elevated troponin Troponin 45 > 54 > 53.  Low and flat.  No angina, only some intermittent shooting L chest pain, exacerbated by twisting or sitting up.  Doubt ACS -Consult to Cardiology, appreciate expertise  Anemia of CKD Hgb 8 g/dL at admission trended down to 7.6 g/dL.  In light of her dyspnea making it difficult to get around, as well as her advanced CHF and renal disease, likely anemia contributing to her heart and kidney failure --> transfused 1 unit on 1/16  Hgb only slight bump to 8 g/dL today -Daily hemogram -Follow iron, B12, folate  Chronic kidney disease stage IV Cr trending up to 3.8 today.  Swelling improved. -Hold diuretic -Daily BMP -Consult Nephrology -Hold amlodipine and losartan, avoid hypotension   Hypertension BP WNL, no hypotension -Continue Toprol -Hold diuretic, losartan, amlodipine  Former history seizures  No longer on AED  Depression Possible dementia -Continue sertraline  GERD -Continue PPI            Disposition: The patient was admitted with dyspnea, tachypnea, and hypoxia due to CHF flare.  We started diuresing, but the patient has had steadily worsening renal function.  We will consult Cardiology and nephrology for assistance with CHF management, management of Cardiorenal syndrome         MDM: The below labs and imaging reports reviewed and summarized above.  Medication management  as above.    DVT prophylaxis: Lovenox, dose reduced Code Status: FULL Family Communication:     Consultants:   Nephrology  Cardiology   Procedures:   1/12 CXR -- patchyy bilateral opacities  1/13 CT chest w/o contrast -- dependent opacities c/w edema and small effusions, subcutaneous edema  1/13 Echo -- EF  35-40%, noted RWMA  1/15 US renal -- cysts, no change from previous, no hydro  1/15 CXR 2V -- venous congestion improved  1/16 transfuion 1 unit  Antimicrobials:   CTX and azithromycin x1 on 1/12  Culture data:   1/13 blood culture x2 -- No growth at 4 days       Subjective: She is intermittently agitated, still sundowning.  During the day she is quite alert and oriented.  No melena, hematochezia.  No improvement in her dyspnea or fatigue.  No dysuria, hematuria.  No angina.      Objective: Vitals:   07/01/19 0425 07/01/19 0800 07/01/19 1434 07/01/19 1542  BP:  118/75  (!) 115/50  Pulse:  84  (!) 53  Resp:  16  16  Temp:  98 F (36.7 C)  97.8 F (36.6 C)  TempSrc:  Oral  Oral  SpO2:  94% 92% 95%  Weight: 84.6 kg     Height:        Intake/Output Summary (Last 24 hours) at 07/01/2019 1621 Last data filed at 07/01/2019 1151 Gross per 24 hour  Intake 265 ml  Output 2300 ml  Net -2035 ml   Filed Weights   06/29/19 0542 06/30/19 0429 07/01/19 0425  Weight: 87.8 kg 88 kg 84.6 kg    Examination: General appearance: Elderly obese adult female, alert and in no acute distress.   HEENT: Anicteric, conjunctiva pink, lids and lashes normal. No nasal deformity, discharge, epistaxis.  Lips moist, teeth normal. OP tacky dry, no oral lesions.   Skin: Warm and dry.  No suspicious rashes or lesions. Cardiac: RRR, no murmurs appreciated.  Nonpitting LE edema.    Respiratory: Normal respiratory rate and rhythm.  Rales in right base, lung sounds diminished overall, nothing on the left, no wheezing. Abdomen: Abdomen soft.  No tenderness palpation or guarding. No ascites, distension, hepatosplenomegaly.   MSK: No deformities or effusions of the large joints of the upper or lower extremities bilaterally. Neuro: Awake and alert. Naming is grossly intact, and the patient's recall, recent and remote, as well as general fund of knowledge seem within normal limits.  Muscle tone normal,  without fasciculations.  Moves all extremities equally and with normal coordination, but severe generalized weakness.  Speech fluent.    Psych: Sensorium intact and responding to questions, attention normal. Affect normal.  Judgment and insight appear moderately impaired.          Data Reviewed: I have personally reviewed following labs and imaging studies:  CBC: Recent Labs  Lab 06/26/19 2237 06/26/19 2237 06/27/19 0456 06/28/19 0400 06/29/19 0554 06/30/19 1929 07/01/19 0526  WBC 7.0  --  7.0 6.6 6.1  --  5.3  HGB 9.2*   < > 8.0* 7.6* 7.8* 7.7* 8.0*  HCT 30.4*   < > 25.6* 24.2* 25.1* 24.7* 25.3*  MCV 91.3  --  90.5 88.6 89.3  --  88.2  PLT 201  --  183 184 172  --  179   < > = values in this interval not displayed.   Basic Metabolic Panel: Recent Labs  Lab 06/27/19 0456 06/28/19 0400 06/29/19 0554 06/30/19 0616 07/01/19  0526  NA 144 143 142 140 140  K 4.8 4.6 4.2 4.4 4.1  CL 115* 111 110 107 108  CO2 22 23 21* 21* 22  GLUCOSE 108* 112* 88 84 90  BUN 49* 50* 57* 63* 71*  CREATININE 3.03* 3.31* 3.30* 3.47* 3.86*  CALCIUM 9.0 9.0 8.8* 8.8* 8.6*   GFR: Estimated Creatinine Clearance: 11.8 mL/min (A) (by C-G formula based on SCr of 3.86 mg/dL (H)). Liver Function Tests: Recent Labs  Lab 06/26/19 2237 06/27/19 0456  AST 28 16  ALT 14 12  ALKPHOS 69 61  BILITOT 1.1 0.8  PROT 7.1 6.6  ALBUMIN 3.7 3.3*   No results for input(s): LIPASE, AMYLASE in the last 168 hours. No results for input(s): AMMONIA in the last 168 hours. Coagulation Profile: No results for input(s): INR, PROTIME in the last 168 hours. Cardiac Enzymes: No results for input(s): CKTOTAL, CKMB, CKMBINDEX, TROPONINI in the last 168 hours. BNP (last 3 results) No results for input(s): PROBNP in the last 8760 hours. HbA1C: No results for input(s): HGBA1C in the last 72 hours. CBG: No results for input(s): GLUCAP in the last 168 hours. Lipid Profile: No results for input(s): CHOL, HDL,  LDLCALC, TRIG, CHOLHDL, LDLDIRECT in the last 72 hours. Thyroid Function Tests: No results for input(s): TSH, T4TOTAL, FREET4, T3FREE, THYROIDAB in the last 72 hours. Anemia Panel: Recent Labs    07/01/19 0526  FERRITIN 27  TIBC 350  IRON 42   Urine analysis:    Component Value Date/Time   COLORURINE COLORLESS (A) 06/27/2019 0456   APPEARANCEUR CLEAR (A) 06/27/2019 0456   APPEARANCEUR Clear 04/19/2013 0807   LABSPEC 1.008 06/27/2019 0456   LABSPEC 1.012 04/19/2013 0807   PHURINE 6.0 06/27/2019 0456   GLUCOSEU NEGATIVE 06/27/2019 0456   GLUCOSEU 50 mg/dL 04/19/2013 0807   HGBUR NEGATIVE 06/27/2019 0456   BILIRUBINUR NEGATIVE 06/27/2019 0456   BILIRUBINUR Negative 04/19/2013 0807   KETONESUR NEGATIVE 06/27/2019 0456   PROTEINUR 30 (A) 06/27/2019 0456   NITRITE NEGATIVE 06/27/2019 0456   LEUKOCYTESUR NEGATIVE 06/27/2019 0456   LEUKOCYTESUR Negative 04/19/2013 0807   Sepsis Labs: @LABRCNTIP (procalcitonin:4,lacticacidven:4)  ) Recent Results (from the past 240 hour(s))  Blood culture (routine x 2)     Status: None (Preliminary result)   Collection Time: 06/27/19 12:47 AM   Specimen: BLOOD  Result Value Ref Range Status   Specimen Description BLOOD BLOOD LEFT FOREARM  Final   Special Requests   Final    BOTTLES DRAWN AEROBIC AND ANAEROBIC Blood Culture results may not be optimal due to an excessive volume of blood received in culture bottles   Culture   Final    NO GROWTH 4 DAYS Performed at [facility], [address] [place], [place] [address]    Report Status PENDING  Incomplete  Blood culture (routine x 2)     Status: None (Preliminary result)   Collection Time: 06/27/19 12:47 AM   Specimen: BLOOD  Result Value Ref Range Status   Specimen Description BLOOD BLOOD RIGHT HAND  Final   Special Requests   Final    BOTTLES DRAWN AEROBIC AND ANAEROBIC Blood Culture adequate volume   Culture   Final    NO GROWTH 4 DAYS Performed at [facility],  [address] [place], [place] [address]    Report Status PENDING  Incomplete  Respiratory Panel by RT PCR (Flu A&B, Covid) - Nasopharyngeal Swab     Status: None   Collection Time: 06/27/19 12:47  AM   Specimen: Nasopharyngeal Swab  Result Value Ref Range Status   SARS Coronavirus 2 by RT PCR NEGATIVE NEGATIVE Final    Comment: (NOTE) SARS-CoV-2 target nucleic acids are NOT DETECTED. The SARS-CoV-2 RNA is generally detectable in upper respiratoy specimens during the acute phase of infection. The lowest concentration of SARS-CoV-2 viral copies this assay can detect is 131 copies/mL. A negative result does not preclude SARS-Cov-2 infection and should not be used as the sole basis for treatment or other patient management decisions. A negative result may occur with  improper specimen collection/handling, submission of specimen other than nasopharyngeal swab, presence of viral mutation(s) within the areas targeted by this assay, and inadequate number of viral copies (<131 copies/mL). A negative result must be combined with clinical observations, patient history, and epidemiological information. The expected result is Negative. Fact Sheet for Patients:  PinkCheek.be Fact Sheet for Healthcare Providers:  GravelBags.it This test is not yet ap proved or cleared by the [place] FDA and  has been authorized for detection and/or diagnosis of SARS-CoV-2 by FDA under an Emergency Use Authorization (EUA). This EUA will remain  in effect (meaning this test can be used) for the duration of the COVID-19 declaration under Section 564(b)(1) of the Act, 21 U.S.C. section 360bbb-3(b)(1), unless the authorization is terminated or revoked sooner.    Influenza A by PCR NEGATIVE NEGATIVE Final   Influenza B by PCR NEGATIVE NEGATIVE Final    Comment: (NOTE) The Xpert Xpress SARS-CoV-2/FLU/RSV assay is intended as an aid in  the diagnosis of  influenza from Nasopharyngeal swab specimens and  should not be used as a sole basis for treatment. Nasal washings and  aspirates are unacceptable for Xpert Xpress SARS-CoV-2/FLU/RSV  testing. Fact Sheet for Patients: PinkCheek.be Fact Sheet for Healthcare Providers: GravelBags.it This test is not yet approved or cleared by the [place] FDA and  has been authorized for detection and/or diagnosis of SARS-CoV-2 by  FDA under an Emergency Use Authorization (EUA). This EUA will remain  in effect (meaning this test can be used) for the duration of the  Covid-19 declaration under Section 564(b)(1) of the Act, 21  U.S.C. section 360bbb-3(b)(1), unless the authorization is  terminated or revoked. Performed at [facility] [address] [place], [place] [address]          Radiology Studies: No results found.      Scheduled Meds: . sodium chloride   Intravenous Once  . aspirin EC  81 mg Oral Daily  . budesonide  0.5 mg Inhalation BID  . enoxaparin (LOVENOX) injection  30 mg Subcutaneous Q24H  . ipratropium-albuterol  3 mL Nebulization TID  . [START ON 07/02/2019] metoprolol succinate  25 mg Oral Daily  . pantoprazole  40 mg Oral Daily  . sertraline  200 mg Oral Daily  . sodium chloride flush  10 mL Intravenous Q12H   Continuous Infusions:    LOS: 4 days    Time spent: 25 minutes  Edwin Dada, MD [facility] 07/01/2019, 4:21 PM     Please page though [place] or Epic secure chat:  For [employer], Adult nurse

## 2019-07-01 NOTE — Progress Notes (Signed)
Patient was initially confused at the beginning of this shift (demanding to go home), but by mid-morning was alert and oriented, answering questions appropriately. Not impulsive today. Ambulated in hall approximately 30-40 feet but felt like this was all she could tolerated today. Sat in chair for awhile this shift. No complaints today.

## 2019-07-01 NOTE — Consult Note (Signed)
Cardiology Consultation:   Patient ID: Misty Berry MRN: [id number]; DOB: 17-Mar-1935  Admit date: 06/26/2019 Date of Consult: 07/01/2019  Primary Care Provider: Nelia Shi, MD Primary Cardiologist: [facility] Dr. Madlyn Frankel Primary Electrophysiologist:  None    Patient Profile:   Misty Berry is a 85 y.o. female with a hx of hypertension, hyperlipidemia, CKD stage IV who is being seen today for the evaluation of dyspnea, reduced ejection fraction at the request of Dr. Loleta Books.  History of Present Illness:   Misty Berry is an 85 year old female with history of hypertension, hyperlipidemia, CKD stage IV, presenting with worsening shortness of breath and lower extremity edema.  Patient able to ambulate at home with no difficulty.  She has noticed shortness of breath when she" overexerts" herself.  Her roommate noticed edema which prompted patient to come to the hospital.  Denies chest pain or palpitations.  In the ER, chest x-ray showed/CT showed atelectasis and small pleural effusions.  Patient was managed with diuretics/Lasix and echocardiogram obtained showed moderately reduced ejection fraction with some regional wall motion abnormalities.  Troponin stayed flat around 56.  Patient denies any history of CAD/heart attack.  She states heart disease runs in the family.  Brother had an MI age 19s.  Mom and dad both had heart disease.  Heart Pathway Score:     Past Medical History:  Diagnosis Date  . Asthma   . Bruises easily   . Cellulitis   . Cough   . DDD (degenerative disc disease), lumbar   . GERD (gastroesophageal reflux disease)   . HBP (high blood pressure)   . Hypercholesterolemia   . Hyperparathyroidism ([place])   . Osteoarthritis   . Osteoporosis   . Rash   . Renal disorder    kidney disease  . Swelling     Past Surgical History:  Procedure Laterality Date  . APPENDECTOMY    . BREAST REDUCTION SURGERY    . CHOLECYSTECTOMY    . EYE SURGERY Left   .  FEMUR SURGERY    . FOOT SURGERY Right   . GALLBLADDER SURGERY    . LIPECTOMY    . PAROTID GLAND TUMOR EXCISION    . THYROID SURGERY    . TONSILLECTOMY    . TUBAL LIGATION    . VITRECTOMY       Home Medications:  Prior to Admission medications   Medication Sig Start Date End Date Taking? Authorizing Provider  amLODipine (NORVASC) 2.5 MG tablet Take 2.5 mg by mouth daily.  12/13/17  Yes [provider]  aspirin EC 81 MG tablet Take 81 mg by mouth daily.    Yes [provider]  calcium-vitamin D (OSCAL WITH D) 500-200 MG-UNIT TABS tablet Take 1 tablet by mouth daily.    Yes [provider]  clobetasol ointment (TEMOVATE) [id number] % Apply 1 application topically 2 (two) times daily. To shins   Yes [provider]  esomeprazole (NEXIUM) 40 MG capsule Take 40 mg by mouth daily.    Yes [provider]  ferrous gluconate (FERGON) 324 MG tablet Take 324 mg by mouth daily.    Yes [provider]  L-Methylfolate-Algae (DEPLIN 15) 15-90.314 MG CAPS Take 15 mg by mouth daily.    Yes [provider]  L-Theanine 100 MG CAPS Take 800 mg by mouth daily.    Yes [provider]  losartan (COZAAR) 25 MG tablet Take 50 mg by mouth daily.  06/18/15  Yes [provider]  metoprolol  succinate (TOPROL-XL) 25 MG 24 hr tablet Take 12.5 mg by mouth daily.  06/17/15  Yes [provider]  MILK THISTLE PO Take 1 tablet by mouth daily.    Yes [provider]  Multiple Vitamins-Minerals (CENTRUM SILVER ADULT 50+) TABS Take 1 tablet by mouth daily.   Yes [provider]  Omega-3 Fatty Acids (FISH OIL) 1000 MG CAPS Take 4,000 mg by mouth daily.    Yes [provider]  S-Adenosylmethionine (SAM-E) 200 MG TABS Take 400 mg by mouth daily.    Yes [provider]  torsemide (DEMADEX) 20 MG tablet Take 20 mg by mouth daily.  09/16/17  Yes [provider]  vitamin B-12 (CYANOCOBALAMIN) 1000 MCG tablet Take  1,000 mcg by mouth daily.    Yes [provider]  ZOLOFT 100 MG tablet Take 200 mg by mouth daily.  06/07/15  Yes [provider]    Inpatient Medications: Scheduled Meds: . sodium chloride   Intravenous Once  . aspirin EC  81 mg Oral Daily  . budesonide  0.5 mg Inhalation BID  . enoxaparin (LOVENOX) injection  30 mg Subcutaneous Q24H  . ipratropium-albuterol  3 mL Nebulization TID  . metoprolol succinate  12.5 mg Oral Daily  . pantoprazole  40 mg Oral Daily  . sertraline  200 mg Oral Daily  . sodium chloride flush  10 mL Intravenous Q12H   Continuous Infusions:  PRN Meds: acetaminophen **OR** acetaminophen, albuterol, hydrALAZINE, ondansetron **OR** ondansetron (ZOFRAN) IV, traMADol  Allergies:    Allergies  Allergen Reactions  . Ivp Dye [Iodinated Diagnostic Agents] Other (See Comments)  . Lactose   . Methylprednisolone Other (See Comments)    Reaction: Hallucinations and Psychosis  . Peanuts [Peanut Oil] Other (See Comments)    Swelling and vomitting  . Vancomycin Other (See Comments)    Reaction: unknown  . Wellbutrin [Bupropion] Other (See Comments)    Hallucinations    Social History:   Social History   Socioeconomic History  . Marital status: Single    Spouse name: Not on file  . Number of children: Not on file  . Years of education: Not on file  . Highest education level: Not on file  Occupational History  . Not on file  Tobacco Use  . Smoking status: Never Smoker  . Smokeless tobacco: Never Used  Substance and Sexual Activity  . Alcohol use: No  . Drug use: No  . Sexual activity: Not on file  Other Topics Concern  . Not on file  Social History Narrative   Lives with room mate   Social Determinants of Health   Financial Resource Strain:   . Difficulty of Paying Living Expenses: Not on file  Food Insecurity:   . Worried About Charity fundraiser in the Last Year: Not on file  . Ran Out of Food in the Last Year: Not on file    Transportation Needs:   . Lack of Transportation (Medical): Not on file  . Lack of Transportation (Non-Medical): Not on file  Physical Activity:   . Days of Exercise per Week: Not on file  . Minutes of Exercise per Session: Not on file  Stress:   . Feeling of Stress : Not on file  Social Connections:   . Frequency of Communication with Friends and Family: Not on file  . Frequency of Social Gatherings with Friends and Family: Not on file  . Attends Religious Services: Not on file  . Active Member of  Clubs or Organizations: Not on file  . Attends Archivist Meetings: Not on file  . Marital Status: Not on file  Intimate Partner Violence:   . Fear of Current or Ex-Partner: Not on file  . Emotionally Abused: Not on file  . Physically Abused: Not on file  . Sexually Abused: Not on file    Family History:   Heart attack in brother age 4s.  Mother and father both had heart disease.  ROS:  Please see the history of present illness.   All other ROS reviewed and negative.     Physical Exam/Data:   Vitals:   06/30/19 2043 07/01/19 0400 07/01/19 0425 07/01/19 0800  BP:  138/81  118/75  Pulse:  65  84  Resp:  20  16  Temp:  98.2 F (36.8 C)  98 F (36.7 C)  TempSrc:  Oral  Oral  SpO2: 96% 95%  94%  Weight:   84.6 kg   Height:        Intake/Output Summary (Last 24 hours) at 07/01/2019 1200 Last data filed at 07/01/2019 1151 Gross per 24 hour  Intake 265 ml  Output 2800 ml  Net -2535 ml   Last 3 Weights 07/01/2019 06/30/2019 06/29/2019  Weight (lbs) 186 lb 8.2 oz 194 lb 193 lb 8 oz  Weight (kg) 84.6 kg 87.998 kg 87.771 kg     Body mass index is 32.01 kg/m.  General:  Well nourished, well developed, in no acute distress HEENT: normal Lymph: no adenopathy Neck: no JVD Endocrine:  No thryomegaly Vascular: No carotid bruits; FA pulses 2+ bilaterally without bruits  Cardiac:  normal S1, S2; RRR; no murmur  Lungs: Expiratory wheezing noted anteriorly, coarse breath  sounds at bases, poor inspiratory effort Abd: soft, nontender, no hepatomegaly  Ext: 1+ edema Musculoskeletal:  No deformities, BUE and BLE strength normal and equal Skin: warm and dry  Neuro:  CNs 2-12 intact, no focal abnormalities noted Psych:  Normal affect   EKG:  The EKG was personally reviewed and demonstrates: Sinus rhythm frequent PACs Telemetry:  Telemetry was personally reviewed and demonstrates: Sinus rhythm frequent PACs  Relevant CV Studies: TTE 06/27/2019 1. Left ventricular ejection fraction, by visual estimation, is 35 to 40%. The left ventricle has moderately decreased function. There is no left ventricular hypertrophy.  2. Left ventricular diastolic function could not be evaluated.  3. The left ventricle demonstrates regional wall motion abnormalities.  4. LV inferior wall hypokinesis.  5. Global right ventricle was not well visualized.The right ventricular size is not assessed. Right vetricular wall thickness was not assessed.  6. Left atrial size was normal.  7. Right atrial size was not well visualized.  8. The mitral valve is normal in structure. No evidence of mitral valve regurgitation.  9. The tricuspid valve is grossly normal. 10. The aortic valve is grossly normal. Aortic valve regurgitation is not visualized. 11. Pulmonic regurgitation not assessed. 12. The pulmonic valve was not well visualized. Pulmonic valve regurgitation not assessed. 13. The interatrial septum was not well visualized.  Laboratory Data:  High Sensitivity Troponin:   Recent Labs  Lab 06/26/19 2237 06/27/19 0047 06/27/19 0456 06/27/19 0720  TROPONINIHS 36* 45* 56* 53*     Chemistry Recent Labs  Lab 06/29/19 0554 06/30/19 0616 07/01/19 0526  NA 142 140 140  K 4.2 4.4 4.1  CL 110 107 108  CO2 21* 21* 22  GLUCOSE 88 84 90  BUN 57* 63* 71*  CREATININE 3.30* 3.47*  3.86*  CALCIUM 8.8* 8.8* 8.6*  GFRNONAA 12* 12* 10*  GFRAA 14* 14* 12*  ANIONGAP 11 12 10     Recent Labs    Lab 06/26/19 2237 06/27/19 0456  PROT 7.1 6.6  ALBUMIN 3.7 3.3*  AST 28 16  ALT 14 12  ALKPHOS 69 61  BILITOT 1.1 0.8   Hematology Recent Labs  Lab 06/28/19 0400 06/28/19 0400 06/29/19 0554 06/30/19 1929 07/01/19 0526  WBC 6.6  --  6.1  --  5.3  RBC 2.73*  --  2.81*  --  2.87*  HGB 7.6*   < > 7.8* 7.7* 8.0*  HCT 24.2*   < > 25.1* 24.7* 25.3*  MCV 88.6  --  89.3  --  88.2  MCH 27.8  --  27.8  --  27.9  MCHC 31.4  --  31.1  --  31.6  RDW 15.8*  --  16.0*  --  15.9*  PLT 184  --  172  --  179   < > = values in this interval not displayed.   BNP Recent Labs  Lab 06/26/19 2237  BNP 1,392.0*    DDimer No results for input(s): DDIMER in the last 168 hours.   Radiology/Studies:  DG Chest 2 View  Result Date: 06/29/2019 CLINICAL DATA:  Dyspnea EXAM: CHEST - 2 VIEW COMPARISON:  06/26/2019 FINDINGS: Normal cardiac silhouette. Interval improvement in vascular congestion. Small LEFT effusion. No pneumothorax. Bibasilar atelectasis. IMPRESSION: Improvement in venous congestion.  Bibasilar atelectasis Electronically Signed   By: Suzy Bouchard M.D.   On: 06/29/2019 16:01   US RENAL  Result Date: 06/29/2019 CLINICAL DATA:  Rib and flank pain for 1 week. Laterality not specified. EXAM: RENAL / URINARY TRACT ULTRASOUND COMPLETE COMPARISON:  Renal ultrasound 02/13/2013.  Abdominal CT 12/10/2021. FINDINGS: Right Kidney: Renal measurements: Approximately 13.1 x 7.8 x 7.6 cm = volume: 405.2 mL. There are multiple renal cysts as seen on previous studies, largest posteriorly upper pole measuring 4.2 x 6.1 x 5.1 cm. No hydronephrosis. Left Kidney: Renal measurements: Approximately 12.8 x 7.4 x 6.8 cm = volume: 336.2 mL. There are multiple renal cysts, largest in the lower pole measuring 6.6 x 7.6 x 6.4 cm. No hydronephrosis. Bladder: Appears normal for degree of bladder distention. Other: None. IMPRESSION: 1. No acute findings or explanation for the patient's symptoms. No hydronephrosis. 2.  Multiple large bilateral renal cysts as seen on previous studies. Electronically Signed   By: Richardean Sale M.D.   On: 06/29/2019 15:36   {  Assessment and Plan:  85 year old female with history of hypertension, hyperlipidemia, CKD stage IV presenting with dyspnea and edema.  Found to have moderately reduced ejection fraction with some wall motion abnormalities.  No chest pain, no significant elevation in troponins.  Lower extremity edema is improved with roughly 8 pound weight loss over the past 2 days.  1. HFrEF EF 35-40% -Holding diuresis in light of acute kidney injury. -Toprol XL 25 mg daily -Avoiding other heart failure medications for now in light of rising creatinine.  -Plan for ischemic work-up/Lexiscan stress test tomorrow.  2.  CKD stage 4 -Avoid nephrotoxic's -Nephrology following  3.  History of hypertension -P controlled.  Continue Toprol-XL.  4.  Anemia -Likely from chronic disease/CKD -Management as per primary team    Signed, Kate Sable, MD  07/01/2019 12:00 PM

## 2019-07-01 NOTE — Plan of Care (Signed)
  Problem: Activity: Goal: Risk for activity intolerance will decrease Outcome: Progressing   

## 2019-07-02 ENCOUNTER — Inpatient Hospital Stay (HOSPITAL_COMMUNITY): Payer: Medicare Other

## 2019-07-02 DIAGNOSIS — N185 Chronic kidney disease, stage 5: Secondary | ICD-10-CM

## 2019-07-02 DIAGNOSIS — R0609 Other forms of dyspnea: Secondary | ICD-10-CM

## 2019-07-02 DIAGNOSIS — D631 Anemia in chronic kidney disease: Secondary | ICD-10-CM | POA: Diagnosis present

## 2019-07-02 DIAGNOSIS — J9601 Acute respiratory failure with hypoxia: Secondary | ICD-10-CM | POA: Diagnosis present

## 2019-07-02 LAB — CBC
HCT: 27.3 % — ABNORMAL LOW (ref 36.0–46.0)
Hemoglobin: 8.4 g/dL — ABNORMAL LOW (ref 12.0–15.0)
MCH: 27.5 pg (ref 26.0–34.0)
MCHC: 30.8 g/dL (ref 30.0–36.0)
MCV: 89.5 fL (ref 80.0–100.0)
Platelets: 159 10*3/uL (ref 150–400)
RBC: 3.05 MIL/uL — ABNORMAL LOW (ref 3.87–5.11)
RDW: 16 % — ABNORMAL HIGH (ref 11.5–15.5)
WBC: 5.8 10*3/uL (ref 4.0–10.5)
nRBC: 0 % (ref 0.0–0.2)

## 2019-07-02 LAB — NM MYOCAR MULTI W/SPECT W/WALL MOTION / EF
Estimated workload: 1 METS
Exercise duration (min): 0 min
Exercise duration (sec): 0 s
MPHR: 138 {beats}/min
Peak HR: 90 {beats}/min
Percent HR: 65 %
Rest HR: 68 {beats}/min

## 2019-07-02 LAB — BASIC METABOLIC PANEL
Anion gap: 11 (ref 5–15)
BUN: 68 mg/dL — ABNORMAL HIGH (ref 8–23)
CO2: 24 mmol/L (ref 22–32)
Calcium: 8.8 mg/dL — ABNORMAL LOW (ref 8.9–10.3)
Chloride: 106 mmol/L (ref 98–111)
Creatinine, Ser: 3.4 mg/dL — ABNORMAL HIGH (ref 0.44–1.00)
GFR calc Af Amer: 14 mL/min — ABNORMAL LOW (ref >60)
GFR calc non Af Amer: 12 mL/min — ABNORMAL LOW (ref >60)
Glucose, Bld: 92 mg/dL (ref 70–99)
Potassium: 4.5 mmol/L (ref 3.5–5.1)
Sodium: 141 mmol/L (ref 135–145)

## 2019-07-02 LAB — RESPIRATORY PANEL BY RT PCR (FLU A&B, COVID)
Influenza A by PCR: NEGATIVE
Influenza B by PCR: NEGATIVE
SARS Coronavirus 2 by RT PCR: NEGATIVE

## 2019-07-02 LAB — CULTURE, BLOOD (ROUTINE X 2)
Culture: NO GROWTH
Culture: NO GROWTH
Special Requests: ADEQUATE

## 2019-07-02 MED ORDER — TECHNETIUM TC 99M TETROFOSMIN IV KIT
9.4000 | PACK | Freq: Once | INTRAVENOUS | Status: AC | PRN
  Administered 2019-07-02: 10:00:00 9.4 via INTRAVENOUS

## 2019-07-02 MED ORDER — TECHNETIUM TC 99M TETROFOSMIN IV KIT
32.3000 | PACK | Freq: Once | INTRAVENOUS | Status: AC | PRN
  Administered 2019-07-02: 32.3 via INTRAVENOUS

## 2019-07-02 MED ORDER — REGADENOSON 0.4 MG/5ML IV SOLN
0.4000 mg | Freq: Once | INTRAVENOUS | Status: AC
  Administered 2019-07-02: 11:00:00 0.4 mg via INTRAVENOUS
  Filled 2019-07-02: qty 5

## 2019-07-02 MED ORDER — TRAMADOL HCL 50 MG PO TABS: 50.0000 mg | ORAL_TABLET | Freq: Four times a day (QID) | ORAL | 0 refills | Status: AC | PRN

## 2019-07-02 MED ORDER — METOPROLOL SUCCINATE ER 25 MG PO TB24: 25.0000 mg | ORAL_TABLET | Freq: Every day | ORAL | Status: DC

## 2019-07-02 MED ORDER — POLYETHYLENE GLYCOL 3350 17 G PO PACK: 17.0000 g | PACK | Freq: Every day | ORAL | 0 refills | Status: DC

## 2019-07-02 MED ORDER — DOCUSATE SODIUM 100 MG PO CAPS: 100.0000 mg | ORAL_CAPSULE | Freq: Two times a day (BID) | ORAL | 0 refills | Status: AC

## 2019-07-02 NOTE — Plan of Care (Signed)
  Problem: Activity: Goal: Risk for activity intolerance will decrease Outcome: Progressing   

## 2019-07-02 NOTE — Care Management Important Message (Signed)
Important Message  Patient Details  Name: Misty Berry MRN: [id number] Date of Birth: Jun 13, 1935   Medicare Important Message Given:  Yes     Dannette Barbara 07/02/2019, 12:05 PM

## 2019-07-02 NOTE — Progress Notes (Addendum)
Report given to [employer] at [facility]. EMS called for transport. Also spoke with brother and he had some questions for the doctor and Education officer, museum. Questioned if patient could go home instead? MD and social work notified to get in touch with patient's brother as I called EMS already.   Update : MD spoke with brother. All in agreement patient will be going to [facility] via EMS.

## 2019-07-02 NOTE — Progress Notes (Signed)
Physical Therapy Treatment Patient Details Name: Misty Berry MRN: [id number] DOB: Nov 08, 1934 Today's Date: 07/02/2019    History of Present Illness Pt is an 85 y.o. female presenting to hospital 06/27/19 with SOB at rest and with exertion.  Pt admitted with mild asthma exacerbation with acute hypoxemia and community acquired PNA.  PMH includes hiatal hernia, asthma, gastric reflux, htn, and CKD.    PT Comments    Pt in bed, ready to walk.  To edge of bed with min a x 1 and verbal cues.  Once seated steady.  Stood and was able to walk 57' with RW and min guard/assist.  Gait remains generally steady but overall weakness remains.  Pt is now aware that she will struggle at home and while she hopes she can go home, she is aware SNF would be more appropriate.  Will adjust discharge recommendations.  Discussed with primary PT..  Follow Up Recommendations  SNF     Equipment Recommendations  Rolling walker with 5" wheels;3in1 (PT)    Recommendations for Other Services       Precautions / Restrictions Precautions Precautions: Fall Restrictions Weight Bearing Restrictions: No    Mobility  Bed Mobility Overal bed mobility: Needs Assistance Bed Mobility: Supine to Sit;Sit to Supine     Supine to sit: HOB elevated;Min assist        Transfers Overall transfer level: Needs assistance Equipment used: Rolling walker (2 wheeled) Transfers: Sit to/from Stand Sit to Stand: Min guard;Min assist            Ambulation/Gait Ambulation/Gait assistance: Min guard Gait Distance (Feet): 50 Feet Assistive device: Rolling walker (2 wheeled) Gait Pattern/deviations: Step-to pattern Gait velocity: decreased   General Gait Details: decreased B LE step length; steady with RW use   Stairs             Wheelchair Mobility    Modified Rankin (Stroke Patients Only)       Balance Overall balance assessment: Needs assistance Sitting-balance support: No upper extremity  supported;Feet supported Sitting balance-Leahy Scale: Good     Standing balance support: Bilateral upper extremity supported Standing balance-Leahy Scale: Fair                              Cognition Arousal/Alertness: Awake/alert Behavior During Therapy: WFL for tasks assessed/performed Overall Cognitive Status: Within Functional Limits for tasks assessed                                 General Comments: very talkative and cues for re-direction.      Exercises      General Comments        Pertinent Vitals/Pain Pain Assessment: Faces Faces Pain Scale: Hurts little more Pain Location: back - mid Pain Descriptors / Indicators: Aching;Grimacing;Sore Pain Intervention(s): Limited activity within patient's tolerance;Monitored during session;Repositioned    Home Living                      Prior Function            PT Goals (current goals can now be found in the care plan section) Progress towards PT goals: Progressing toward goals    Frequency    Min 2X/week      PT Plan Current plan remains appropriate;Discharge plan needs to be updated    Co-evaluation  AM-PAC PT "6 Clicks" Mobility   Outcome Measure  Help needed turning from your back to your side while in a flat bed without using bedrails?: A Little Help needed moving from lying on your back to sitting on the side of a flat bed without using bedrails?: A Little Help needed moving to and from a bed to a chair (including a wheelchair)?: A Little Help needed standing up from a chair using your arms (e.g., wheelchair or bedside chair)?: A Little Help needed to walk in hospital room?: A Little Help needed climbing 3-5 steps with a railing? : A Lot 6 Click Score: 17    End of Session Equipment Utilized During Treatment: Gait belt Activity Tolerance: Patient tolerated treatment well;Patient limited by fatigue Patient left: in chair;with chair alarm set;with  call bell/phone within reach Nurse Communication: Mobility status       Time: [id number] PT Time Calculation (min) (ACUTE ONLY): 17 min  Charges:  $Gait Training: 8-22 mins                    Chesley Noon, PTA 07/02/19, 2:39 PM

## 2019-07-02 NOTE — TOC Transition Note (Signed)
Transition of Care [facility]) - CM/SW Discharge Note   Patient Details  Name: Misty Berry MRN: [id number] Date of Birth: June 21, 1934  [facility]) CM/SW Contact:  Victorino Dike, RN Phone Number: 07/02/2019, 3:27 PM   Clinical Narrative:     Met with patient and she is agreeable to go to SNF at [facility].  Talked with Brother and he is in agreement with plan.  Notified of where patient is going.  He requested to speak with physician at discharge.  Message sent to Dr Eudelia Bunch.  Patient to be transported to [place] by [facility].  No further TOC needs at this time, please re-consult for new needs.   Final next level of care: [place] Barriers to Discharge: Barriers Resolved   Patient Goals and CMS Choice     Choice offered to / list presented to : Patient  Discharge Placement              Patient chooses bed at: [facility] Patient to be transferred to facility by: [facility] Name of family member notified: Brother Beckie Salts Patient and family notified of of transfer: 07/02/19  Discharge Plan and Services   Discharge Planning Services: CM Consult                                 Social Determinants of Health (SDOH) Interventions     Readmission Risk Interventions No flowsheet data found.

## 2019-07-02 NOTE — Progress Notes (Signed)
Progress Note  Patient Name: Misty Berry Date of Encounter: 07/02/2019  Primary Cardiologist: Dr. Madlyn Frankel [facility]  Subjective   Able to lie flat w/o difficult.  Isn't sure that breathing has improved much but no dyspnea @ rest.  For myoview today.  Inpatient Medications    Scheduled Meds: . sodium chloride   Intravenous Once  . aspirin EC  81 mg Oral Daily  . budesonide  0.5 mg Inhalation BID  . docusate sodium  100 mg Oral BID  . enoxaparin (LOVENOX) injection  30 mg Subcutaneous Q24H  . ipratropium-albuterol  3 mL Nebulization TID  . metoprolol succinate  25 mg Oral Daily  . pantoprazole  40 mg Oral Daily  . polyethylene glycol  17 g Oral Daily  . sertraline  200 mg Oral Daily  . sodium chloride flush  10 mL Intravenous Q12H   Continuous Infusions:  PRN Meds: acetaminophen **OR** acetaminophen, albuterol, hydrALAZINE, ondansetron **OR** ondansetron (ZOFRAN) IV, traMADol   Vital Signs    Vitals:   07/02/19 0422 07/02/19 0423 07/02/19 0743 07/02/19 0834  BP:  (!) 142/56 129/67   Pulse:  61 60   Resp:  19 19   Temp:  97.9 F (36.6 C) 98 F (36.7 C)   TempSrc:  Oral Oral   SpO2:  93% 92% 92%  Weight: 87.3 kg     Height:        Intake/Output Summary (Last 24 hours) at 07/02/2019 1051 Last data filed at 07/02/2019 0900 Gross per 24 hour  Intake --  Output 2601 ml  Net -2601 ml   Filed Weights   06/30/19 0429 07/01/19 0425 07/02/19 0422  Weight: 88 kg 84.6 kg 87.3 kg    Physical Exam   GEN: Well nourished, well developed, in no acute distress.  HEENT: Grossly normal.  Neck: Supple, no JVD, carotid bruits, or masses. Cardiac: RRR, no murmurs, rubs, or gallops. No clubbing, cyanosis, trace bilat LE edema.  Radials/DP/PT 2+ and equal bilaterally.  Respiratory:  Respirations regular and unlabored, clear to auscultation bilaterally. GI: Soft, nontender, nondistended, BS + x 4. MS: no deformity or atrophy. Skin: warm and dry, no rash. Neuro:  Strength  and sensation are intact. Psych: AAOx3.  Normal affect.  Labs    Chemistry Recent Labs  Lab 06/26/19 2237 06/26/19 2237 06/27/19 0456 06/28/19 0400 06/30/19 0616 07/01/19 0526 07/02/19 0523  NA 141   < > 144   < > 140 140 141  K 5.0   < > 4.8   < > 4.4 4.1 4.5  CL 112*   < > 115*   < > 107 108 106  CO2 20*   < > 22   < > 21* 22 24  GLUCOSE 101*   < > 108*   < > 84 90 92  BUN 47*   < > 49*   < > 63* 71* 68*  CREATININE 2.95*   < > 3.03*   < > 3.47* 3.86* 3.40*  CALCIUM 9.0   < > 9.0   < > 8.8* 8.6* 8.8*  PROT 7.1  --  6.6  --   --   --   --   ALBUMIN 3.7  --  3.3*  --   --   --   --   AST 28  --  16  --   --   --   --   ALT 14  --  12  --   --   --   --  ALKPHOS 69  --  61  --   --   --   --   BILITOT 1.1  --  0.8  --   --   --   --   GFRNONAA 14*   < > 14*   < > 12* 10* 12*  GFRAA 16*   < > 16*   < > 14* 12* 14*  ANIONGAP 9   < > 7   < > 12 10 11    < > = values in this interval not displayed.     Hematology Recent Labs  Lab 06/28/19 0400 06/28/19 0400 06/29/19 0554 06/30/19 1929 07/01/19 0526  WBC 6.6  --  6.1  --  5.3  RBC 2.73*  --  2.81*  --  2.87*  HGB 7.6*   < > 7.8* 7.7* 8.0*  HCT 24.2*   < > 25.1* 24.7* 25.3*  MCV 88.6  --  89.3  --  88.2  MCH 27.8  --  27.8  --  27.9  MCHC 31.4  --  31.1  --  31.6  RDW 15.8*  --  16.0*  --  15.9*  PLT 184  --  172  --  179   < > = values in this interval not displayed.    Cardiac Enzymes  Recent Labs  Lab 06/26/19 2237 06/27/19 0047 06/27/19 0456 06/27/19 0720  TROPONINIHS 36* 45* 56* 53*      BNP Recent Labs  Lab 06/26/19 2237  BNP 1,392.0*     Radiology    DG Chest 2 View  Result Date: 06/29/2019 CLINICAL DATA:  Dyspnea EXAM: CHEST - 2 VIEW COMPARISON:  06/26/2019 FINDINGS: Normal cardiac silhouette. Interval improvement in vascular congestion. Small LEFT effusion. No pneumothorax. Bibasilar atelectasis. IMPRESSION: Improvement in venous congestion.  Bibasilar atelectasis Electronically Signed    By: Suzy Bouchard M.D.   On: 06/29/2019 16:01   US RENAL  Result Date: 06/29/2019 CLINICAL DATA:  Rib and flank pain for 1 week. Laterality not specified. EXAM: RENAL / URINARY TRACT ULTRASOUND COMPLETE COMPARISON:  Renal ultrasound 02/13/2013.  Abdominal CT 12/10/2021. FINDINGS: Right Kidney: Renal measurements: Approximately 13.1 x 7.8 x 7.6 cm = volume: 405.2 mL. There are multiple renal cysts as seen on previous studies, largest posteriorly upper pole measuring 4.2 x 6.1 x 5.1 cm. No hydronephrosis. Left Kidney: Renal measurements: Approximately 12.8 x 7.4 x 6.8 cm = volume: 336.2 mL. There are multiple renal cysts, largest in the lower pole measuring 6.6 x 7.6 x 6.4 cm. No hydronephrosis. Bladder: Appears normal for degree of bladder distention. Other: None. IMPRESSION: 1. No acute findings or explanation for the patient's symptoms. No hydronephrosis. 2. Multiple large bilateral renal cysts as seen on previous studies. Electronically Signed   By: Richardean Sale M.D.   On: 06/29/2019 15:36    Telemetry    Seen in nuc med - WAP/RSR w/ freq pac's - Personally Reviewed  Cardiac Studies   2D Echocardiogram   TTE 06/27/2019 1. Left ventricular ejection fraction, by visual estimation, is 35 to 40%. The left ventricle has moderately decreased function. There is no left ventricular hypertrophy.  2. Left ventricular diastolic function could not be evaluated.  3. The left ventricle demonstrates regional wall motion abnormalities.  4. LV inferior wall hypokinesis.  5. Global right ventricle was not well visualized.The right ventricular size is not assessed. Right vetricular wall thickness was not assessed.  6. Left atrial size was normal.  7. Right atrial size was  not well visualized.  8. The mitral valve is normal in structure. No evidence of mitral valve regurgitation.  9. The tricuspid valve is grossly normal. 10. The aortic valve is grossly normal. Aortic valve regurgitation is not  visualized. 11. Pulmonic regurgitation not assessed. 12. The pulmonic valve was not well visualized. Pulmonic valve regurgitation not assessed. 13. The interatrial septum was not well visualized. _____________   Patient Profile     85 y.o. female w/ a h/o HTN, HL, CKD IV, OA, and GERD, who was admitted 1/12 w/ progressive dyspnea and CHF. EF 35-40% by echo this admission (45% by echo 09/2013 @ [facility]).  Assessment & Plan    1.  Acute on chronic systolic CHF:  Pt admitted w/ progressive dyspnea and finding of chf and atx.  Minus 7.5L this admission, though intakes not accurately recorded.  Wt down 0.7kg since admission.  Creat bumped w/ diuresis and diuretic rx has been on hold.  Nephrology following.  Echo this admission w/ EF of 35-40%. Review of care everywhere shows prior echo in 2015 w/ EF of 45%.  Patient says she is not sure if breathing has improved though she is able to lie flat.  She has only trace lower extremity edema on exam.  For myoview this morning to assess for ischemic cause of cardiomyopathy, though troponin trend has been flat.  With acute on chronic kidney disease, she is a poor candidate for catheterization.  Cont  blocker. No acei/arb/arni/mra in setting of ckd iv w/ AKI this admission.  BP/HR stable.  With LV dysfxn, will consider low-dose hydralazine and nitrate as blood pressure allows though in the setting of acute kidney injury this admission, prefer to avoid hypotension.  2.  Acute kidney injury on CKD IV:  Nephrology following.  Creatinine slightly improved this morning-BUN 68/creatinine 3.40.  Avoiding nephrotoxic agents at this time.  3.  Essential hypertension: Blood pressure somewhat variable.  Continue beta-blocker therapy.    4.  Normocytic anemia: Status post transfusion on January 16.  Stable.  Signed, Murray Hodgkins, NP  07/02/2019, 10:51 AM    For questions or updates, please contact -  Please consult [email or web address] for contact info under  Cardiology/STEMI.

## 2019-07-02 NOTE — Progress Notes (Signed)
OT Cancellation Note  Patient Details Name: ALEYNAH BLUMBERG MRN: [id number] DOB: [birth year]   Cancelled Treatment:    Reason Eval/Treat Not Completed: Patient at procedure or test/ unavailable  Attempted to see patient for OT session but she was at a stress test.  Will attempt again later today or tomorrow!  Chrys Racer, OTR/L, NTMT[facility] ascom [phone] 07/02/19, 10:45 AM

## 2019-07-02 NOTE — Plan of Care (Signed)

## 2019-07-02 NOTE — Discharge Summary (Signed)
Physician Discharge Summary  Misty Berry [id number] DOB: 1934/12/16 DOA: 06/26/2019  PCP: Nelia Shi, MD  Admit date: 06/26/2019 Discharge date: 07/02/2019  Admitted From: Home  Disposition:  SNF [facility]   Recommendations for Outpatient Follow-up:  1. Follow up with [facility] Nephrology Dr. Smith Mince in 1-2 weeks 2. Follow up with [facility] Cardiology Dr. Madlyn Frankel in 2 weeks 3. Please obtain BMP and CBC in 1 week and fax to Dr. Smith Mince 4. Dr. Benedict Needy: Please refer to GI for evaluation of iron deficiency anemia      Home Health: N/A  Equipment/Devices: TBD at SNF  Discharge Condition: Fair  CODE STATUS: FULL Diet recommendation: Cardiac, low sodium  Brief/Interim Summary: Misty Berry is a 84 y.o. F with asthma, CKD stage IV baseline Cr 2.9-3.5, HTN, sCHF EF 40% 2015, and left eye blindness who presents with sudden onset shortness of breath.  In the ER, chest x-ray showed bilateral opacities.  BNP >1000.  She was breathing 25-30 times per minute, SPO2 was documented in the 80s with EMS.  WBC 7K.  COVID-.          PRINCIPAL HOSPITAL DIAGNOSIS: Acute on chronic systolic and diastolic CHF    Discharge Diagnoses:   Acute on chronic systolic and diastolic CHF Acute hypoxic respiratory failure Patient was admitted with CXR showing bibasilar opacities, CT with small effusions, no wheezing, as well as elevated BNP.  Echo repeated EF is now down to 35-40%.  Started on diuretics.  Patient diuresed, swelling improved.  Documented 6.3L net negative on admission, likely less given inaccurate weights, oral intake recorded.    Creatinine increased however to 3.8 mg/dL due to hypotension, and diuresis held.  Cardiology and nephrology consulted, 1 unit PRBCs transfused and patient's Cr improved to 3.4 mg/dL.  She appeared medically optimized.  Discharged on torsemide 20 mg daily with close Nephrology and Cardiology follow up. Continue metoprolol.  Hold  losartan and amlodipine.         Delirium  Patient had sundowning, confusion most nights.  She was alert and oriented during the day.    Asthma without exacerbation  Pneumonia ruled out  Elevated troponin Troponin 45 > 54 > 53.  Low and flat.  No angina, only some intermittent shooting L chest pain, exacerbated by twisting or sitting up.  Doubt ACS.  Nuclear stress testing showed no large reversible defects.  Anemia of CKD Hgb 8 g/dL at admission trended down to 7.6 g/dL.  In light of her dyspnea making it difficult to get around, as well as her advanced CHF and renal disease, likely anemia contributing to her heart and kidney failure --> transfused 1 unit on 1/16  Hgb appropriate post-transfusion increase.  Ferritin 20s.  Discharged on iron.  Recommend GI follow up.  Chronic kidney disease stage V Creatinine baseline appears to be mid-3s.  Stop amlodipine and losartan.  Avoid hypotension or nephrotoxins.    Hypertension Continue Toprol, torsemide.  Hold losartan, amlodipine.  Depression Possible dementia Recommend outpatient Neuro-psych testing           Discharge Instructions  Discharge Instructions    Diet - low sodium heart healthy   Complete by: As directed    Discharge instructions   Complete by: As directed    Schedule follow up with [facility] Nephrology Dr. Smith Mince in 2 weeks Schedule follow up with [facility] Cardiology Dr. Madlyn Frankel in 2 weeks  Obtain BMP in 1 week and forward to Dr. Smith Mince   Increase activity slowly  Complete by: As directed      Allergies as of 07/02/2019      Reactions   Ivp Dye [iodinated Diagnostic Agents] Other (See Comments)   Lactose    Methylprednisolone Other (See Comments)   Reaction: Hallucinations and Psychosis   Peanuts [peanut Oil] Other (See Comments)   Swelling and vomitting   Vancomycin Other (See Comments)   Reaction: unknown   Wellbutrin [bupropion] Other (See Comments)   Hallucinations      Medication List     STOP taking these medications   amLODipine 2.5 MG tablet Commonly known as: NORVASC   losartan 25 MG tablet Commonly known as: COZAAR     TAKE these medications   aspirin EC 81 MG tablet Take 81 mg by mouth daily.   calcium-vitamin D 500-200 MG-UNIT Tabs tablet Commonly known as: OSCAL WITH D Take 1 tablet by mouth daily.   Centrum Silver Adult 50+ Tabs Take 1 tablet by mouth daily.   clobetasol ointment 0.05 % Commonly known as: TEMOVATE Apply 1 application topically 2 (two) times daily. To shins   Deplin 15 15-90.314 MG Caps Take 15 mg by mouth daily.   docusate sodium 100 MG capsule Commonly known as: COLACE Take 1 capsule (100 mg total) by mouth 2 (two) times daily.   esomeprazole 40 MG capsule Commonly known as: NEXIUM Take 40 mg by mouth daily.   ferrous gluconate 324 MG tablet Commonly known as: FERGON Take 324 mg by mouth daily.   Fish Oil 1000 MG Caps Take 4,000 mg by mouth daily.   L-Theanine 100 MG Caps Take 800 mg by mouth daily.   metoprolol succinate 25 MG 24 hr tablet Commonly known as: TOPROL-XL Take 1 tablet (25 mg total) by mouth daily. Start taking on: July 03, 2019 What changed: how much to take   MILK THISTLE PO Take 1 tablet by mouth daily.   polyethylene glycol 17 g packet Commonly known as: MIRALAX / GLYCOLAX Take 17 g by mouth daily. Start taking on: July 03, 2019   SAM-e 200 MG Tabs Take 400 mg by mouth daily.   torsemide 20 MG tablet Commonly known as: DEMADEX Take 20 mg by mouth daily.   traMADol 50 MG tablet Commonly known as: ULTRAM Take 1 tablet (50 mg total) by mouth every 6 (six) hours as needed for moderate pain.   vitamin B-12 1000 MCG tablet Commonly known as: CYANOCOBALAMIN Take 1,000 mcg by mouth daily.   Zoloft 100 MG tablet Generic drug: sertraline Take 200 mg by mouth daily.       Contact information for follow-up providers    Powell-Tillman, Sara Chu, MD. Schedule an appointment  as soon as possible for a visit.   Why: 1 week after discharge from skilled nursing Contact information: [address] [place] [address] [phone]        Franciso Bend, MD. Schedule an appointment as soon as possible for a visit in 2 week(s).   Specialty: Nephrology Contact information: [address] [place] [address] [phone]        Sidonie Dickens, MD. Schedule an appointment as soon as possible for a visit in 2 week(s).   Specialty: Cardiology Contact information: [place] [address] [phone]            Contact information for after-discharge care    [facility] .   Service: Skilled Nursing Contact information: [place] [phone]                 Allergies  Allergen Reactions  . Ivp Dye [Iodinated Diagnostic Agents] Other (See Comments)  . Lactose   . Methylprednisolone Other (See Comments)    Reaction: Hallucinations and Psychosis  . Peanuts [Peanut Oil] Other (See Comments)    Swelling and vomitting  . Vancomycin Other (See Comments)    Reaction: unknown  . Wellbutrin [Bupropion] Other (See Comments)    Hallucinations    Consultations:  Cardiology  Nephrology   Procedures/Studies: DG Chest 2 View  Result Date: 06/29/2019 CLINICAL DATA:  Dyspnea EXAM: CHEST - 2 VIEW COMPARISON:  06/26/2019 FINDINGS: Normal cardiac silhouette. Interval improvement in vascular congestion. Small LEFT effusion. No pneumothorax. Bibasilar atelectasis. IMPRESSION: Improvement in venous congestion.  Bibasilar atelectasis Electronically Signed   By: Suzy Bouchard M.D.   On: 06/29/2019 16:01   CT CHEST WO CONTRAST  Result Date: 06/27/2019 CLINICAL DATA:  Shortness of breath at rest and with exertion. EXAM: CT CHEST WITHOUT CONTRAST TECHNIQUE: Multidetector CT imaging of  the chest was performed following the standard protocol without IV contrast. COMPARISON:  None. FINDINGS: Cardiovascular: Trace anterior pericardial fluid and/or thickening. Overall normal heart size. Atherosclerotic calcification of the aorta and coronaries. Mediastinum/Nodes: Moderate hiatal hernia containing both fat and stomach. No adenopathy, inflammatory changes, or mass. Lungs/Pleura: Dependent pulmonary opacity with volume loss and small pleural effusions. No edema, consolidation, or generalized ground-glass opacity. Upper Abdomen: Partially covered left renal cystic densities. Musculoskeletal: Exaggerated kyphosis with scoliosis and bridging thoracic osteophytes. Healed right posterior rib fractures. Subcutaneous edema along the right flank which appears asymmetric on reformats. Question underlying trauma. IMPRESSION: 1. Dependent atelectasis and small pleural effusions. 2. Aortic and coronary atherosclerosis. 3. Subcutaneous edema along the right flank could be positional anasarca but is not seen on the left, please correlate for underlying trauma. 4. Multilevel thoracic ankylosis. 5. Hiatal hernia. Electronically Signed   By: Monte Fantasia M.D.   On: 06/27/2019 04:37   US RENAL  Result Date: 06/29/2019 CLINICAL DATA:  Rib and flank pain for 1 week. Laterality not specified. EXAM: RENAL / URINARY TRACT ULTRASOUND COMPLETE COMPARISON:  Renal ultrasound 02/13/2013.  Abdominal CT 12/10/2021. FINDINGS: Right Kidney: Renal measurements: Approximately 13.1 x 7.8 x 7.6 cm = volume: 405.2 mL. There are multiple renal cysts as seen on previous studies, largest posteriorly upper pole measuring 4.2 x 6.1 x 5.1 cm. No hydronephrosis. Left Kidney: Renal measurements: Approximately 12.8 x 7.4 x 6.8 cm = volume: 336.2 mL. There are multiple renal cysts, largest in the lower pole measuring 6.6 x 7.6 x 6.4 cm. No hydronephrosis. Bladder: Appears normal for degree of bladder distention. Other: None. IMPRESSION: 1. No  acute findings or explanation for the patient's symptoms. No hydronephrosis. 2. Multiple large bilateral renal cysts as seen on previous studies. Electronically Signed   By: Richardean Sale M.D.   On: 06/29/2019 15:36   NM Myocar Multi W/Spect W/Wall Motion / EF  Result Date: 07/02/2019  Defect 1: There is a small defect of moderate severity present in the apex location. This is likely due to breast attenuation artifact  This is an intermediate risk study due to reduced EF  The left ventricular ejection fraction is moderately decreased (30-44%).  No evidence of reversibility to suggest ischemia and no large perfusion defects to suggest previous infarct.  Suspect nonischemic cardiomyopathy.  DG Chest Portable 1 View  Result Date: 06/26/2019 CLINICAL DATA:  Shortness of breath EXAM: PORTABLE CHEST 1 VIEW COMPARISON:  12/29/2018 FINDINGS: Heart is borderline in size. Patchy bilateral airspace opacities. Moderate-sized hiatal hernia. No effusions. No acute bony abnormality. IMPRESSION: Patchy bilateral airspace opacities concerning for pneumonia. Electronically Signed   By: Rolm Baptise M.D.   On: 06/26/2019 22:53   ECHOCARDIOGRAM COMPLETE  Result Date: 06/27/2019   ECHOCARDIOGRAM REPORT   Patient Name:   Misty Berry Date of Exam: 06/27/2019 Medical Rec #:  [id number]      Height:       64.0 in Accession #:    [id number]     Weight:       170.0 lb Date of Birth:  1935-04-02      BSA:          1.83 m Patient Age:    30 years       BP:           186/97 mmHg Patient Gender: F              HR:           80 bpm. Exam Location:  [facility] Procedure: 2D Echo, Color Doppler and Cardiac Doppler Indications:     CHF 428.0  History:         Patient has no prior history of Echocardiogram examinations.                  High blood pressure, hypercholesterolemia.  Sonographer:     Sherrie Sport RDCS (AE) Referring Phys:  [id number] Victoriano Lain A THOMAS Diagnosing Phys: Kate Sable MD  Sonographer Comments: No apical  window and no subcostal window. Patient could not lie on left side. IMPRESSIONS  1. Left ventricular ejection fraction, by visual estimation, is 35 to 40%. The left ventricle has moderately decreased function. There is no left ventricular hypertrophy.  2. Left ventricular diastolic function could not be evaluated.  3. The left ventricle demonstrates regional wall motion abnormalities.  4. LV inferior wall hypokinesis.  5. Global right ventricle was not well visualized.The right ventricular size is not assessed. Right vetricular wall thickness was not assessed.  6. Left atrial size was normal.  7. Right atrial size was not well visualized.  8. The mitral valve is normal in structure. No evidence of mitral valve regurgitation.  9. The tricuspid valve is grossly normal. 10. The aortic valve is grossly normal. Aortic valve regurgitation is not visualized. 11. Pulmonic regurgitation not assessed. 12. The pulmonic valve was not well visualized. Pulmonic valve regurgitation not assessed. 13. The interatrial septum was not well visualized. FINDINGS  Left Ventricle: Left ventricular ejection fraction, by visual estimation, is 35 to 40%. The left ventricle has moderately decreased function. The left ventricle demonstrates regional wall motion abnormalities. There is no left ventricular hypertrophy. Left ventricular diastolic function could not be evaluated. LV inferior wall hypokinesis. Right Ventricle: The right ventricular size is not assessed. Right vetricular wall thickness was not assessed. Global RV systolic function is was not well visualized. Left Atrium: Left atrial size was normal in size. Right Atrium: Right atrial size was not well visualized Pericardium: There is no evidence of pericardial effusion. Mitral Valve: The mitral valve is normal in structure. No evidence of mitral valve regurgitation. Tricuspid Valve: The tricuspid valve is grossly normal. Tricuspid valve regurgitation is not demonstrated. Aortic  Valve: The aortic valve is grossly normal. Aortic valve regurgitation is not visualized. Pulmonic Valve:  The pulmonic valve was not well visualized. Pulmonic valve regurgitation not assessed. Pulmonic regurgitation not assessed. Aorta: The aortic root is normal in size and structure. Venous: The inferior vena cava was not well visualized. IAS/Shunts: The interatrial septum was not well visualized.  LEFT VENTRICLE PLAX 2D LVIDd:         4.92 cm LVIDs:         4.09 cm LV PW:         1.33 cm LV IVS:        1.61 cm LVOT diam:     2.10 cm LV SV:         40 ml LV SV Index:   21.22 LVOT Area:     3.46 cm  LEFT ATRIUM         Index LA diam:    5.40 cm 2.96 cm/m                        PULMONIC VALVE AORTA                 RVOT Peak grad: 1 mmHg Ao Root diam: 2.60 cm   SHUNTS Systemic Diam: 2.10 cm  Kate Sable MD Electronically signed by Kate Sable MD Signature Date/Time: 06/27/2019/1:49:04 PM    Final        Subjective: Feeling well.  No dyspnea, orthopnea.  No chest pain.  No palpitations, no fever, no sputum.  No confusion.  Discharge Exam: Vitals:   07/02/19 1225 07/02/19 1317  BP: (!) 158/59   Pulse: 81   Resp: 18   Temp: 98 F (36.7 C)   SpO2: 92% 92%   Vitals:   07/02/19 0743 07/02/19 0834 07/02/19 1225 07/02/19 1317  BP: 129/67  (!) 158/59   Pulse: 60  81   Resp: 19  18   Temp: 98 F (36.7 C)  98 F (36.7 C)   TempSrc: Oral  Oral   SpO2: 92% 92% 92% 92%  Weight:      Height:        General: Pt is alert, awake, not in acute distress, lying in bed. Cardiovascular: RRR, nl S1-S2, no murmurs appreciated.   1+ bilateral LE edema.   Respiratory: Normal respiratory rate and rhythm.  CTAB without rales or wheezes. Abdominal: Abdomen soft and non-tender.  No distension or HSM.   Neuro/Psych: Strength symmetric in upper and lower extremities.  Judgment and insight appear normal.            The results of significant diagnostics from this hospitalization (including  imaging, microbiology, ancillary and laboratory) are listed below for reference.     Microbiology: Recent Results (from the past 240 hour(s))  Blood culture (routine x 2)     Status: None   Collection Time: 06/27/19 12:47 AM   Specimen: BLOOD  Result Value Ref Range Status   Specimen Description BLOOD BLOOD LEFT FOREARM  Final   Special Requests   Final    BOTTLES DRAWN AEROBIC AND ANAEROBIC Blood Culture results may not be optimal due to an excessive volume of blood received in culture bottles   Culture   Final    NO GROWTH 5 DAYS Performed at [facility], [address], [place], [place] [address]    Report Status 07/02/2019 FINAL  Final  Blood culture (routine x 2)     Status: None   Collection Time: 06/27/19 12:47 AM   Specimen: BLOOD  Result Value Ref Range  Status   Specimen Description BLOOD BLOOD RIGHT HAND  Final   Special Requests   Final    BOTTLES DRAWN AEROBIC AND ANAEROBIC Blood Culture adequate volume   Culture   Final    NO GROWTH 5 DAYS Performed at [facility], [address] [place], [place] [address]    Report Status 07/02/2019 FINAL  Final  Respiratory Panel by RT PCR (Flu A&B, Covid) - Nasopharyngeal Swab     Status: None   Collection Time: 06/27/19 12:47 AM   Specimen: Nasopharyngeal Swab  Result Value Ref Range Status   SARS Coronavirus 2 by RT PCR NEGATIVE NEGATIVE Final    Comment: (NOTE) SARS-CoV-2 target nucleic acids are NOT DETECTED. The SARS-CoV-2 RNA is generally detectable in upper respiratoy specimens during the acute phase of infection. The lowest concentration of SARS-CoV-2 viral copies this assay can detect is 131 copies/mL. A negative result does not preclude SARS-Cov-2 infection and should not be used as the sole basis for treatment or other patient management decisions. A negative result may occur with  improper specimen collection/handling, submission of specimen other than nasopharyngeal swab, presence of viral  mutation(s) within the areas targeted by this assay, and inadequate number of viral copies (<131 copies/mL). A negative result must be combined with clinical observations, patient history, and epidemiological information. The expected result is Negative. Fact Sheet for Patients:  PinkCheek.be Fact Sheet for Healthcare Providers:  GravelBags.it This test is not yet ap proved or cleared by the [place] FDA and  has been authorized for detection and/or diagnosis of SARS-CoV-2 by FDA under an Emergency Use Authorization (EUA). This EUA will remain  in effect (meaning this test can be used) for the duration of the COVID-19 declaration under Section 564(b)(1) of the Act, 21 U.S.C. section 360bbb-3(b)(1), unless the authorization is terminated or revoked sooner.    Influenza A by PCR NEGATIVE NEGATIVE Final   Influenza B by PCR NEGATIVE NEGATIVE Final    Comment: (NOTE) The Xpert Xpress SARS-CoV-2/FLU/RSV assay is intended as an aid in  the diagnosis of influenza from Nasopharyngeal swab specimens and  should not be used as a sole basis for treatment. Nasal washings and  aspirates are unacceptable for Xpert Xpress SARS-CoV-2/FLU/RSV  testing. Fact Sheet for Patients: PinkCheek.be Fact Sheet for Healthcare Providers: GravelBags.it This test is not yet approved or cleared by the [place] FDA and  has been authorized for detection and/or diagnosis of SARS-CoV-2 by  FDA under an Emergency Use Authorization (EUA). This EUA will remain  in effect (meaning this test can be used) for the duration of the  Covid-19 declaration under Section 564(b)(1) of the Act, 21  U.S.C. section 360bbb-3(b)(1), unless the authorization is  terminated or revoked. Performed at [facility], [address] [place], [place] [address]      Labs: BNP (last 3 results) Recent Labs     06/26/19 2237  BNP [id number]*   Basic Metabolic Panel: Recent Labs  Lab 06/28/19 0400 06/29/19 0554 06/30/19 0616 07/01/19 0526 07/02/19 0523  NA 143 142 140 140 141  K 4.6 4.2 4.4 4.1 4.5  CL 111 110 107 108 106  CO2 23 21* 21* 22 24  GLUCOSE 112* 88 84 90 92  BUN 50* 57* 63* 71* 68*  CREATININE 3.31* 3.30* 3.47* 3.86* 3.40*  CALCIUM 9.0 8.8* 8.8* 8.6* 8.8*   Liver Function Tests: Recent Labs  Lab 06/26/19 2237 06/27/19 0456  AST 28 16  ALT 14 12  ALKPHOS 69 61  BILITOT 1.1 0.8  PROT 7.1 6.6  ALBUMIN 3.7 3.3*   CBC: Recent Labs  Lab 06/27/19 0456 06/27/19 0456 06/28/19 0400 06/29/19 0554 06/30/19 1929 07/01/19 0526 07/02/19 1219  WBC 7.0  --  6.6 6.1  --  5.3 5.8  HGB 8.0*   < > 7.6* 7.8* 7.7* 8.0* 8.4*  HCT 25.6*   < > 24.2* 25.1* 24.7* 25.3* 27.3*  MCV 90.5  --  88.6 89.3  --  88.2 89.5  PLT 183  --  184 172  --  179 159   < > = values in this interval not displayed.    Anemia work up Recent Labs    07/01/19 0526 07/01/19 0922  VITAMINB12  --  434  FERRITIN 27  --   TIBC 350  --   IRON 42  --    Urinalysis    Component Value Date/Time   COLORURINE COLORLESS (A) 06/27/2019 0456   APPEARANCEUR CLEAR (A) 06/27/2019 0456   APPEARANCEUR Clear 04/19/2013 0807   LABSPEC 1.008 06/27/2019 0456   LABSPEC 1.012 04/19/2013 0807   PHURINE 6.0 06/27/2019 0456   GLUCOSEU NEGATIVE 06/27/2019 0456   GLUCOSEU 50 mg/dL 04/19/2013 0807   HGBUR NEGATIVE 06/27/2019 0456   BILIRUBINUR NEGATIVE 06/27/2019 0456   BILIRUBINUR Negative 04/19/2013 0807   KETONESUR NEGATIVE 06/27/2019 0456   PROTEINUR 30 (A) 06/27/2019 0456   NITRITE NEGATIVE 06/27/2019 0456   LEUKOCYTESUR NEGATIVE 06/27/2019 0456   LEUKOCYTESUR Negative 04/19/2013 0807   Microbiology Recent Results (from the past 240 hour(s))  Blood culture (routine x 2)     Status: None   Collection Time: 06/27/19 12:47 AM   Specimen: BLOOD  Result Value Ref Range Status   Specimen Description BLOOD  BLOOD LEFT FOREARM  Final   Special Requests   Final    BOTTLES DRAWN AEROBIC AND ANAEROBIC Blood Culture results may not be optimal due to an excessive volume of blood received in culture bottles   Culture   Final    NO GROWTH 5 DAYS Performed at [facility], [address] [place], [place] [address]    Report Status 07/02/2019 FINAL  Final  Blood culture (routine x 2)     Status: None   Collection Time: 06/27/19 12:47 AM   Specimen: BLOOD  Result Value Ref Range Status   Specimen Description BLOOD BLOOD RIGHT HAND  Final   Special Requests   Final    BOTTLES DRAWN AEROBIC AND ANAEROBIC Blood Culture adequate volume   Culture   Final    NO GROWTH 5 DAYS Performed at [facility], [address] [place], [place] [address]    Report Status 07/02/2019 FINAL  Final  Respiratory Panel by RT PCR (Flu A&B, Covid) - Nasopharyngeal Swab     Status: None   Collection Time: 06/27/19 12:47 AM   Specimen: Nasopharyngeal Swab  Result Value Ref Range Status   SARS Coronavirus 2 by RT PCR NEGATIVE NEGATIVE Final    Comment: (NOTE) SARS-CoV-2 target nucleic acids are NOT DETECTED. The SARS-CoV-2 RNA is generally detectable in upper respiratoy specimens during the acute phase of infection. The lowest concentration of SARS-CoV-2 viral copies this assay can detect is 131 copies/mL. A negative result does not preclude SARS-Cov-2 infection and should not be used as the sole basis for treatment or other patient management decisions. A negative result may occur with  improper specimen collection/handling, submission of specimen other than nasopharyngeal swab, presence of viral  mutation(s) within the areas targeted by this assay, and inadequate number of viral copies (<131 copies/mL). A negative result must be combined with clinical observations, patient history, and epidemiological information. The expected result is Negative. Fact Sheet for Patients:   PinkCheek.be Fact Sheet for Healthcare Providers:  GravelBags.it This test is not yet ap proved or cleared by the [place] FDA and  has been authorized for detection and/or diagnosis of SARS-CoV-2 by FDA under an Emergency Use Authorization (EUA). This EUA will remain  in effect (meaning this test can be used) for the duration of the COVID-19 declaration under Section 564(b)(1) of the Act, 21 U.S.C. section 360bbb-3(b)(1), unless the authorization is terminated or revoked sooner.    Influenza A by PCR NEGATIVE NEGATIVE Final   Influenza B by PCR NEGATIVE NEGATIVE Final    Comment: (NOTE) The Xpert Xpress SARS-CoV-2/FLU/RSV assay is intended as an aid in  the diagnosis of influenza from Nasopharyngeal swab specimens and  should not be used as a sole basis for treatment. Nasal washings and  aspirates are unacceptable for Xpert Xpress SARS-CoV-2/FLU/RSV  testing. Fact Sheet for Patients: PinkCheek.be Fact Sheet for Healthcare Providers: GravelBags.it This test is not yet approved or cleared by the [place] FDA and  has been authorized for detection and/or diagnosis of SARS-CoV-2 by  FDA under an Emergency Use Authorization (EUA). This EUA will remain  in effect (meaning this test can be used) for the duration of the  Covid-19 declaration under Section 564(b)(1) of the Act, 21  U.S.C. section 360bbb-3(b)(1), unless the authorization is  terminated or revoked. Performed at [facility], [address] [place], [place] [address]      Time coordinating discharge: 35 minutes The [place] controlled substances registry was reviewed for this patient prior to filling the <5 days supply controlled substances script.      SIGNED:   Edwin Dada, MD  [facility] 07/02/2019, 2:20 PM

## 2019-07-03 LAB — FOLATE RBC
Folate, Hemolysate: 361 ng/mL
Folate, RBC: 1410 ng/mL (ref >498)
Hematocrit: 25.6 % — ABNORMAL LOW (ref 34.0–46.6)

## 2019-08-07 ENCOUNTER — Encounter: Payer: Medicare Other | Admitting: Hematology and Oncology

## 2019-08-07 DIAGNOSIS — D509 Iron deficiency anemia, unspecified: Secondary | ICD-10-CM | POA: Insufficient documentation

## 2019-08-07 DIAGNOSIS — D631 Anemia in chronic kidney disease: Secondary | ICD-10-CM | POA: Insufficient documentation

## 2019-08-09 NOTE — Progress Notes (Signed)
[facility]  [address] [place], [place] [address] Phone: [phone]  Fax: [phone]   Clinic Day:  08/13/2019  Referring physician: Ceasar Lund*  Chief Complaint: Misty Berry is a 85 y.o. female with iron deficiency anemia and stage IV chronic kidney disease who is referred in consultation by Dr. Trinda Pascal for assessment and management.   HPI:  The patient is unaware of her iron deficiency anemia.  She has stage IV chronic kidney disease from nephrosclerosis in setting of NSAID exposure and cardiac disease. CrCl is 12-14 ml/min.  She was admitted to [facility] from 06/26/2019 to 07/02/2019 with acute on chronic systolic and diastolic CHF.  She presented with sudden onset shortness of breath.  O2 sats were in the 80s.  CXR showed bibasilar opacities. Chest CT on 06/27/2019 revealed dependent atelectasis, small effusions and right flank SQ edema.  COVID-19 was negative.  BNP was elevated. Echo on 06/27/2019 revealed an EF 35-40%.  Edema improved on diuretics (6.3 liters negative during admission).   During admission, creatinine increased to 3.8 mg/dL due to hypotension.  Diuresis was held.  Cardiology and nephrology consulted.  She received 1 unit PRBCs on 06/30/2019.  Creatinine improved to 3.4. She was discharged on torsemide 20 mg daily. Metoprolol continued.  Losartan and amlodipine held.    Labs followed: 06/26/2019:  Hematocrit 30.4, hemoglobin 9.2, MCV 91.3, platelets 201,000, WBC 7000. 06/30/2019:  Hematocrit 24.7, hemoglobin 7.7. 07/02/2019:  Hematocrit 27.3, hemoglobin 8.4, MCV 89.5, platelets 159,000, WBC 5800.   Follow up with Dr. Smith Mince (nephrology) in 1-2 weeks and Dr. Madlyn Frankel (cardiology) in 2 weeks was recommended. She was referred to GI for evaluation of iron deficiency anemia.   She had a telephone visit with Dr. Smith Mince on 07/09/2019. She had some edema. She denied any shortness of breath. She was not on any supplemental  oxygen. Patient was ready to start dialysis if and when needed. She recommended IV iron and ESA.  She was referred to hematology. She was encouraged to follow up with her cardiologist. She remained on her current medications. Losartan and Norvasc continued to be held.   Creatinine has been followed: 2.77 on 03/22/2018, 2.27 on 08/22/2018, 2.8 on 01/09/2019, 2.82 on 03/14/2019, 3.05 on 06/20/2019, 3.86 on 07/01/2019, 3.40 on 07/02/2019.  Ferritin has been followed: 91 on 02/17/2013, 91 on 10/09/2013, 45.4 on 09/29/2015, 52.1 on 08/22/2018, 13.5 on 06/20/2019, 27 on 07/01/2019.  B12 was 434 on 07/01/2019. Folate hemolysate 361 and folate RBC 1410.  TSH was 1.138 on 06/27/2019.  CBCs prior to admission: 10/25/2016:  Hematocrit 33.2, hemoglobin 10.9, MCV 98.8, platelets 163,000, WBC 6400. 10/26/2016:  Hematocrit 32.7, hemoglobin 10.5, MCV 97.7, platelets 162,000, WBC 5500. 02/02/2017:  Hematocrit 35.8, hemoglobin 11.6, MCV 97.0, platelets 207,000, WBC 6300. 01/09/2019:  Hematocrit 31.4, hemoglobin 10.4, MCV 91.0, platelets 201,000, WBC 6400. 03/14/2019:  Hematocrit 31.4, hemoglobin 10.5, MCV 93.0, platelets 214,000, WBC 5600. 06/20/2019:  Hematocrit 25.6, hemoglobin   8.2, MCV 88.0, platelets 214,000, WBC 5600.  Symptomatically, she is doing well. She is performing ADLs without any assistance. She is eating a lot or protein and meat. She is eating dark green leafy vegetables. She used to crave a lot of sugar but does not any more. She has no ice pica or restless legs.   She notes bruising on her arms. She is not on any blood thinners. She denies bleeding of any kind. She has no black stool.  Last colonoscopy was > 15 years ago and was  normal.   She denies any B symptoms. She denies any shortness of breath or chest pains. She has no back pain. She has no GI or urinary symptoms. She denies any bone or joint issues. She has occasional swelling in her feet. She is on a diuretic. She reports not taking  ibuprofen for a year.   She has never been on oral iron. I encouraged her to take ferrous sulfate daily with OJ or vitamin C. Patient is not interested in a transfusion.   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 She has a history of CHF (EF 40% in 2015 and 35-40% in 2021).    Past Medical History:  Diagnosis Date  . Asthma   . Bruises easily   . Cellulitis   . Cough   . DDD (degenerative disc disease), lumbar   . GERD (gastroesophageal reflux disease)   . HBP (high blood pressure)   . Hypercholesterolemia   . Hyperparathyroidism ([place])   . Osteoarthritis   . Osteoporosis   . Rash   . Renal disorder    kidney disease  . Swelling     Past Surgical History:  Procedure Laterality Date  . APPENDECTOMY    . BREAST REDUCTION SURGERY    . CHOLECYSTECTOMY    . EYE SURGERY Left   . FEMUR SURGERY    . FOOT SURGERY Right   . GALLBLADDER SURGERY    . LIPECTOMY    . PAROTID GLAND TUMOR EXCISION    . THYROID SURGERY    . TONSILLECTOMY    . TUBAL LIGATION    . VITRECTOMY      History reviewed. No pertinent family history.  Social History:  reports that she has never smoked. She has never used smokeless tobacco. She reports that she does not drink alcohol or use drugs. She used to smoke a pack a day when she was a teenager. She gave up shortly after and has not smoke since. She is from [facility]. She used to live in [place]. She moved to [place] in 2006 because most of her friends lived in this area. She is retired. She used to work in an office setting. Patients current place of residence is at [facility] facility. She lives in private housing. She does not have any children and she is divorced. The patient is alone today.  Allergies:  Allergies  Allergen Reactions  . Ivp Dye [Iodinated Diagnostic Agents] Other (See Comments)  . Lactose   . Methylprednisolone Other (See Comments)    Reaction: Hallucinations  and Psychosis  . Peanuts [Peanut Oil] Other (See Comments)    Swelling and vomitting  . Vancomycin Other (See Comments)    Reaction: unknown  . Wellbutrin [Bupropion] Other (See Comments)    Hallucinations    Current Medications: Current Outpatient Medications  Medication Sig Dispense Refill  . aspirin EC 81 MG tablet Take 81 mg by mouth daily.     . calcium-vitamin D (OSCAL WITH D) 500-200 MG-UNIT TABS tablet Take 1 tablet by mouth daily.     . clobetasol ointment (TEMOVATE) [id number] % Apply 1 application topically 2 (two) times daily. To shins    . docusate sodium (COLACE) 100 MG capsule Take 1 capsule (100 mg total) by mouth 2 (two) times daily. 10 capsule 0  . esomeprazole (NEXIUM) 40 MG capsule Take 40 mg by mouth daily.     . ferrous gluconate (FERGON) 324 MG tablet Take  324 mg by mouth daily.     Marland Kitchen L-Methylfolate-Algae (DEPLIN 15) 15-90.314 MG CAPS Take 15 mg by mouth daily.     Marland Kitchen L-Theanine 100 MG CAPS Take 800 mg by mouth daily.     . metoprolol succinate (TOPROL-XL) 25 MG 24 hr tablet Take 1 tablet (25 mg total) by mouth daily.    Marland Kitchen MILK THISTLE PO Take 1 tablet by mouth daily.     . Multiple Vitamins-Minerals (CENTRUM SILVER ADULT 50+) TABS Take 1 tablet by mouth daily.    . Omega-3 Fatty Acids (FISH OIL) 1000 MG CAPS Take 4,000 mg by mouth daily.     . polyethylene glycol (MIRALAX / GLYCOLAX) 17 g packet Take 17 g by mouth daily. 14 each 0  . S-Adenosylmethionine (SAM-E) 200 MG TABS Take 400 mg by mouth daily.     Marland Kitchen torsemide (DEMADEX) 20 MG tablet Take 20 mg by mouth daily.   2  . traMADol (ULTRAM) 50 MG tablet Take 1 tablet (50 mg total) by mouth every 6 (six) hours as needed for moderate pain. 8 tablet 0  . vitamin B-12 (CYANOCOBALAMIN) 1000 MCG tablet Take 1,000 mcg by mouth daily.     Marland Kitchen ZOLOFT 100 MG tablet Take 200 mg by mouth daily.      No current facility-administered medications for this visit.    Review of Systems  Constitutional: Negative for chills,  diaphoresis, fever, malaise/fatigue and weight loss.       Doing well. Performing ADLs without assistance.  HENT: Negative for congestion, ear discharge, ear pain, hearing loss, nosebleeds, sinus pain, sore throat and tinnitus.   Eyes: Negative for blurred vision.       Left eye removed. Issues with depth perception.  Respiratory: Negative for cough, hemoptysis, sputum production and shortness of breath.   Cardiovascular: Negative for chest pain, palpitations and leg swelling (occasional; feet).  Gastrointestinal: Negative for abdominal pain, blood in stool, constipation, diarrhea, heartburn, melena, nausea and vomiting.       Eating well. No ice pica. Last colonoscopy 15 years ago.  Genitourinary: Negative for dysuria, frequency, hematuria and urgency.  Musculoskeletal: Negative for back pain (DDD), joint pain (osteoporosis), myalgias and neck pain.  Skin: Negative for itching and rash.  Neurological: Negative for dizziness, tingling, sensory change, weakness and headaches.       No restless legs.  Endo/Heme/Allergies: Bruises/bleeds easily (bilateral arm bruising).  Psychiatric/Behavioral: Negative for depression and memory loss. The patient is not nervous/anxious and does not have insomnia.   All other systems reviewed and are negative.  Performance status (ECOG): 1  Vitals Blood pressure 129/65, pulse 65, temperature (!) 97.1 F (36.2 C), temperature source Tympanic, resp. rate 18, height 5\' 4"  (1.626 m), SpO2 100 %.   Physical Exam  Constitutional: She is oriented to person, place, and time. She appears well-developed and well-nourished. No distress.  Patient sitting comfortably in a wheelchair in no acute distress.  She is examined in her chair.  HENT:  Head: Normocephalic and atraumatic.  Mouth/Throat: Oropharynx is clear and moist.  Short black/gray hair.  Face shield.  Eyes: Conjunctivae and EOM are normal. No scleral icterus.  Glasses.  Blue/gray eyes.  Right eye reactive.   Decreased left eye opening with crusting on lids.   Cardiovascular: Normal rate, regular rhythm and normal heart sounds.  No murmur heard. Pulmonary/Chest: Effort normal and breath sounds normal. No respiratory distress. She has no wheezes. She has no rales. She exhibits no tenderness.  Abdominal: Soft.  Bowel sounds are normal. She exhibits no distension and no mass. There is no abdominal tenderness. There is no rebound and no guarding.  Musculoskeletal:        General: Edema (bilateral 3+) present. No tenderness. Normal range of motion.     Cervical back: Normal range of motion and neck supple.  Lymphadenopathy:       Head (right side): No preauricular, no posterior auricular and no occipital adenopathy present.       Head (left side): No preauricular, no posterior auricular and no occipital adenopathy present.    She has no cervical adenopathy.    She has no axillary adenopathy.       Right: No inguinal and no supraclavicular adenopathy present.       Left: No inguinal and no supraclavicular adenopathy present.  Neurological: She is alert and oriented to person, place, and time.  Skin: Skin is warm and dry. No rash noted. She is not diaphoretic. No erythema. No pallor.  Ecchymosis on lower extremities s/p trauma.  Psychiatric: She has a normal mood and affect. Her behavior is normal.  Nursing note and vitals reviewed.   No visits with results within 3 Day(s) from this visit.  Latest known visit with results is:  No results displayed because visit has over 200 results.      Assessment:  Misty Berry is a 86 y.o. female with stage IV chronic kidney disease and iron deficiency anemia.  Diet is good.  She has chronic kidney disease secondary to nephrosclerosis in setting of NSAID exposure and cardiac disease.  Creatinine was 3.40 on 07/02/2019.  CrCl is 12-14 ml/min.  Ferritin has been followed: 91 on 02/17/2013, 91 on 10/09/2013, 45.4 on 09/29/2015, 52.1 on 08/22/2018, 13.5 on  06/20/2019, 27 on 07/01/2019.   She received 1 unit PRBCs on 06/30/2019.    Normal studies in 06/2019: B12, folate, and TSH.  Last colonoscopy was > 15 years ago and was normal.  She was admitted to [facility] from 06/26/2019 to 07/02/2019 with acute on chronic systolic and diastolic CHF.  Echo on 06/27/2019 revealed an EF 35-40%.  Symptomatically, she denies any complaints.  She denies any melena, hematochezia, hematuria or vaginal bleeding.  Plan: 1.   Labs today:  CBC with diff, retic, ferritin, iron studies, folate. 2.   Stage IV chronic kidney disease  Hematocrit was 27.3, hemoglobin 8.4, and MCV 89.5 on 07/02/2019.  Discuss anemia of chronic renal disease and management with ESA.   Discuss administration of Retacrit and potential side effects.   Discuss ensuring no deficiencies (iron or vitamin) prior to initiation.  Preauth Retacrit.  Several questions asked and answered. 3.   Iron deficiency  Ferritin was 27 on 07/01/2019.  Patient denies any bleeding.  Discuss iron rich foods and oral iron.   Information provided.  Discuss rechecking iron stores and repleting if low prior to administration of Retacrit.  Preauth Venofer.  4.   RTC on 08/17/2019 for MD assessment (Doximity), review of work-up and discussion regarding direction of therapy.  I discussed the assessment and treatment plan with the patient.  The patient was provided an opportunity to ask questions and all were answered.  The patient agreed with the plan and demonstrated an understanding of the instructions.  The patient was advised to call back if the symptoms worsen or if the condition fails to improve as anticipated.  I provided 31 minutes of face-to-face time during this this encounter and > 50% was spent counseling as  documented under my assessment and plan.  An additional 12 minutes were spent reviewing her chart (Epic and Care Everywhere) including notes and labs.    Melissa C. Mike Gip, MD, PhD    08/13/2019, 2:58  PM  I, Selena Batten am acting as scribe for [employer]. Mike Gip, MD, PhD.  I, Melissa C. Mike Gip, MD, have reviewed the above documentation for accuracy and completeness, and I agree with the above.

## 2019-08-13 ENCOUNTER — Inpatient Hospital Stay: Payer: Medicare Other

## 2019-08-13 ENCOUNTER — Encounter: Payer: Self-pay | Admitting: Hematology and Oncology

## 2019-08-13 ENCOUNTER — Inpatient Hospital Stay: Payer: Medicare Other | Attending: Hematology and Oncology | Admitting: Hematology and Oncology

## 2019-08-13 ENCOUNTER — Other Ambulatory Visit: Payer: Self-pay

## 2019-08-13 VITALS — BP 129/65 | HR 65 | Temp 97.1°F | Resp 18 | Ht 64.0 in

## 2019-08-13 DIAGNOSIS — I5042 Chronic combined systolic (congestive) and diastolic (congestive) heart failure: Secondary | ICD-10-CM

## 2019-08-13 DIAGNOSIS — R918 Other nonspecific abnormal finding of lung field: Secondary | ICD-10-CM | POA: Diagnosis not present

## 2019-08-13 DIAGNOSIS — E611 Iron deficiency: Secondary | ICD-10-CM | POA: Diagnosis not present

## 2019-08-13 DIAGNOSIS — D631 Anemia in chronic kidney disease: Secondary | ICD-10-CM | POA: Diagnosis present

## 2019-08-13 DIAGNOSIS — Z881 Allergy status to other antibiotic agents status: Secondary | ICD-10-CM | POA: Diagnosis not present

## 2019-08-13 DIAGNOSIS — N184 Chronic kidney disease, stage 4 (severe): Secondary | ICD-10-CM | POA: Diagnosis not present

## 2019-08-13 DIAGNOSIS — Z87891 Personal history of nicotine dependence: Secondary | ICD-10-CM

## 2019-08-13 DIAGNOSIS — Z79899 Other long term (current) drug therapy: Secondary | ICD-10-CM | POA: Diagnosis not present

## 2019-08-13 DIAGNOSIS — D509 Iron deficiency anemia, unspecified: Secondary | ICD-10-CM

## 2019-08-13 DIAGNOSIS — I130 Hypertensive heart and chronic kidney disease with heart failure and stage 1 through stage 4 chronic kidney disease, or unspecified chronic kidney disease: Secondary | ICD-10-CM | POA: Insufficient documentation

## 2019-08-13 DIAGNOSIS — Z992 Dependence on renal dialysis: Secondary | ICD-10-CM | POA: Diagnosis not present

## 2019-08-13 LAB — CBC WITH DIFFERENTIAL/PLATELET
Abs Immature Granulocytes: 0.01 10*3/uL (ref 0.00–0.07)
Basophils Absolute: 0 10*3/uL (ref 0.0–0.1)
Basophils Relative: 0 %
Eosinophils Absolute: 0.1 10*3/uL (ref 0.0–0.5)
Eosinophils Relative: 2 %
HCT: 26.3 % — ABNORMAL LOW (ref 36.0–46.0)
Hemoglobin: 8.3 g/dL — ABNORMAL LOW (ref 12.0–15.0)
Immature Granulocytes: 0 %
Lymphocytes Relative: 16 %
Lymphs Abs: 1 10*3/uL (ref 0.7–4.0)
MCH: 28 pg (ref 26.0–34.0)
MCHC: 31.6 g/dL (ref 30.0–36.0)
MCV: 88.9 fL (ref 80.0–100.0)
Monocytes Absolute: 0.4 10*3/uL (ref 0.1–1.0)
Monocytes Relative: 6 %
Neutro Abs: 5 10*3/uL (ref 1.7–7.7)
Neutrophils Relative %: 76 %
Platelets: 160 10*3/uL (ref 150–400)
RBC: 2.96 MIL/uL — ABNORMAL LOW (ref 3.87–5.11)
RDW: 18.6 % — ABNORMAL HIGH (ref 11.5–15.5)
WBC: 6.6 10*3/uL (ref 4.0–10.5)
nRBC: 0 % (ref 0.0–0.2)

## 2019-08-13 LAB — RETICULOCYTES
Immature Retic Fract: 28.1 % — ABNORMAL HIGH (ref 2.3–15.9)
RBC.: 2.95 MIL/uL — ABNORMAL LOW (ref 3.87–5.11)
Retic Count, Absolute: 48.4 10*3/uL (ref 19.0–186.0)
Retic Ct Pct: 1.6 % (ref 0.4–3.1)

## 2019-08-13 NOTE — Patient Instructions (Signed)
Iron tablets, capsules, extended-release tablets What is this medicine? IRON (AHY ern) replaces iron that is essential to healthy red blood cells. Iron is used to treat iron deficiency anemia. Anemia may cause problems like tiredness, shortness of breath, or slowed growth in children. Only take iron if your doctor has told you to. Do not treat yourself with iron if you are feeling tired. Most healthy people get enough iron in their diets, particularly if they eat cereals, meat, poultry, and fish. This medicine may be used for other purposes; ask your health care provider or pharmacist if you have questions. COMMON BRAND NAME(S): Berneda Rose Ferrous Sulfate, Bifera, Duofer, EZFE, Feosol, Feosol Complete, [employer], [place], [place], [place], [place], Ollie 150, Ferrex-150, Ferrimin, Ferro-Sequels, Journalist, newspaper, Academic librarian, [place], iFerex 150, Nephro-Fer, Niferex, NovaFerrum, Nu-Iron, Poly-Iron, ProFe, Proferrin ES, Slow Fe, Slow Iron, Tandem What should I tell my health care provider before I take this medicine? They need to know if you have any of these conditions:  frequently drink alcohol  bowel disease  hemolytic anemia  iron overload (hemochromatosis, hemosiderosis)  liver disease  problems with swallowing  stomach ulcer or other stomach problems  an unusual or allergic reaction to iron, other medicines, foods, dyes, or preservatives  pregnant or trying to get pregnant  breast-feeding How should I use this medicine? Take this medicine by mouth with a glass of water or fruit juice. Follow the directions on the prescription label. Swallow whole. Do not crush or chew. Take this medicine in an upright or sitting position. Try to take any bedtime doses at least 10 minutes before lying down. You may take this medicine with food. Take your medicine at regular intervals. Do not take your medicine more often than directed. Do not stop taking except on your doctor's  advice. Talk to your pediatrician regarding the use of this medicine in children. While this drug may be prescribed for selected conditions, precautions do apply. Overdosage: If you think you have taken too much of this medicine contact a poison control center or emergency room at once. NOTE: This medicine is only for you. Do not share this medicine with others. What if I miss a dose? If you miss a dose, take it as soon as you can. If it is almost time for your next dose, take only that dose. Do not take double or extra doses. What may interact with this medicine? If you are taking this iron product, you should not take iron in any other medicine or dietary supplement. This medicine may also interact with the following medications:  alendronate  antacids  cefdinir  chloramphenicol  cholestyramine  deferoxamine  dimercaprol  etidronate  medicines for stomach ulcers or other stomach problems  pancreatic enzymes  quinolone antibiotics (examples: Cipro, Floxin, Levaquin, Tequin and others)  risedronate  tetracycline antibiotics (examples: doxycycline, tetracycline, minocycline, and others)  thyroid hormones This list may not describe all possible interactions. Give your health care provider a list of all the medicines, herbs, non-prescription drugs, or dietary supplements you use. Also tell them if you smoke, drink alcohol, or use illegal drugs. Some items may interact with your medicine. What should I watch for while using this medicine? Use iron supplements only as directed by your health care professional. Dennis Bast will need important blood work while you are taking this medicine. It may take 3 to 6 months of therapy to treat low iron levels. Pregnant women should follow the dose and length of iron treatment as directed by their  doctors. Do not use iron longer than prescribed, and do not take a higher dose than recommended. Long-term use may cause excess iron to build-up in the  body. Do not take iron with antacids. If you need to take an antacid, take it 2 hours after a dose of iron. What side effects may I notice from receiving this medicine? Side effects that you should report to your doctor or health care professional as soon as possible:  allergic reactions like skin rash, itching or hives, swelling of the face, lips, or tongue  blue lips, nails, or palms  dark colored stools (this may be due to the iron, but can indicate a more serious condition)  drowsiness  pain with or difficulty swallowing  pale or clammy skin  seizures  stomach pain  unusually weak or tired  vomiting  weak, fast, or irregular heartbeat Side effects that usually do not require medical attention (report to your doctor or health care professional if they continue or are bothersome):  constipation  indigestion  nausea or stomach upset This list may not describe all possible side effects. Call your doctor for medical advice about side effects. You may report side effects to FDA at 1-800-FDA-1088. Where should I keep my medicine? Keep out of the reach of children. Even small amounts of iron can be harmful to a child. Store at room temperature between 15 and 30 degrees C (59 and 86 degrees F). Keep container tightly closed. Throw away any unused medicine after the expiration date. NOTE: This sheet is a summary. It may not cover all possible information. If you have questions about this medicine, talk to your doctor, pharmacist, or health care provider.  2020 Elsevier/Gold Standard (2007-10-17 17:03:41) Iron Deficiency Anemia, Adult Iron-deficiency anemia is when you have a low amount of red blood cells or hemoglobin. This happens because you have too little iron in your body. Hemoglobin carries oxygen to parts of the body. Anemia can cause your body to not get enough oxygen. It may or may not cause symptoms. Follow these instructions at home: Medicines  Take over-the-counter  and prescription medicines only as told by your doctor. This includes iron pills (supplements) and vitamins.  If you cannot handle taking iron pills by mouth, ask your doctor about getting iron through: ? A vein (intravenously). ? A shot (injection) into a muscle.  Take iron pills when your stomach is empty. If you cannot handle this, take them with food.  Do not drink milk or take antacids at the same time as your iron pills.  To prevent trouble pooping (constipation), eat fiber or take medicine (stool softener) as told by your doctor. Eating and drinking   Talk with your doctor before changing the foods you eat. He or she may tell you to eat foods that have a lot of iron, such as: ? Liver. ? Lowfat (lean) beef. ? Breads and cereals that have iron added to them (fortified breads and cereals). ? Eggs. ? Dried fruit. ? Dark green, leafy vegetables.  Drink enough fluid to keep your pee (urine) clear or pale yellow.  Eat fresh fruits and vegetables that are high in vitamin C. They help your body to use iron. Foods with a lot of vitamin C include: ? Oranges. ? Peppers. ? Tomatoes. ? Mangoes. General instructions  Return to your normal activities as told by your doctor. Ask your doctor what activities are safe for you.  Keep yourself clean, and keep things clean around you (your surroundings).  Anemia can make you get sick more easily.  Keep all follow-up visits as told by your doctor. This is important. Contact a doctor if:  You feel sick to your stomach (nauseous).  You throw up (vomit).  You feel weak.  You are sweating for no clear reason.  You have trouble pooping, such as: ? Pooping (having a bowel movement) less than 3 times a week. ? Straining to poop. ? Having poop that is hard, dry, or larger than normal. ? Feeling full or bloated. ? Pain in the lower belly. ? Not feeling better after pooping. Get help right away if:  You pass out (faint). If this happens,  do not drive yourself to the hospital. Call your local emergency services (911 in the [place]  You have chest pain.  You have shortness of breath that: ? Is very bad. ? Gets worse with physical activity.  You have a fast heartbeat.  You get light-headed when getting up from sitting or lying down. This information is not intended to replace advice given to you by your health care provider. Make sure you discuss any questions you have with your health care provider. Document Revised: 05/13/2017 Document Reviewed: 02/18/2016 Elsevier Patient Education  [place].

## 2019-08-13 NOTE — Progress Notes (Signed)
The patient is not aware of what medication she is taking. Bilateral feet swollen

## 2019-08-14 LAB — IRON AND TIBC
Iron: 43 ug/dL (ref 28–170)
Saturation Ratios: 18 % (ref 10.4–31.8)
TIBC: 245 ug/dL — ABNORMAL LOW (ref 250–450)
UIBC: 202 ug/dL

## 2019-08-14 LAB — FERRITIN: Ferritin: 66 ng/mL (ref 11–307)

## 2019-08-14 LAB — FOLATE: Folate: >100 ng/mL (ref >5.9)

## 2019-08-17 ENCOUNTER — Encounter: Payer: Self-pay | Admitting: Hematology and Oncology

## 2019-08-17 ENCOUNTER — Inpatient Hospital Stay: Payer: Medicare Other | Attending: Hematology and Oncology | Admitting: Hematology and Oncology

## 2019-08-17 DIAGNOSIS — D631 Anemia in chronic kidney disease: Secondary | ICD-10-CM

## 2019-08-23 ENCOUNTER — Other Ambulatory Visit (INDEPENDENT_AMBULATORY_CARE_PROVIDER_SITE_OTHER): Payer: Self-pay | Admitting: Vascular Surgery

## 2019-08-23 DIAGNOSIS — N186 End stage renal disease: Secondary | ICD-10-CM

## 2019-08-27 ENCOUNTER — Encounter (INDEPENDENT_AMBULATORY_CARE_PROVIDER_SITE_OTHER): Payer: Medicare Other

## 2019-08-27 ENCOUNTER — Encounter (INDEPENDENT_AMBULATORY_CARE_PROVIDER_SITE_OTHER): Payer: Medicare Other | Admitting: Vascular Surgery

## 2019-08-27 ENCOUNTER — Other Ambulatory Visit (INDEPENDENT_AMBULATORY_CARE_PROVIDER_SITE_OTHER): Payer: Medicare Other

## 2019-09-10 ENCOUNTER — Ambulatory Visit (INDEPENDENT_AMBULATORY_CARE_PROVIDER_SITE_OTHER): Payer: Medicare Other

## 2019-09-10 ENCOUNTER — Ambulatory Visit (INDEPENDENT_AMBULATORY_CARE_PROVIDER_SITE_OTHER): Payer: Medicare Other | Admitting: Vascular Surgery

## 2019-09-10 ENCOUNTER — Encounter (INDEPENDENT_AMBULATORY_CARE_PROVIDER_SITE_OTHER): Payer: Self-pay | Admitting: Vascular Surgery

## 2019-09-10 ENCOUNTER — Other Ambulatory Visit: Payer: Self-pay

## 2019-09-10 VITALS — BP 177/96 | HR 56 | Ht 66.0 in

## 2019-09-10 DIAGNOSIS — N185 Chronic kidney disease, stage 5: Secondary | ICD-10-CM | POA: Diagnosis not present

## 2019-09-10 DIAGNOSIS — N186 End stage renal disease: Secondary | ICD-10-CM

## 2019-09-10 DIAGNOSIS — I10 Essential (primary) hypertension: Secondary | ICD-10-CM | POA: Diagnosis not present

## 2019-09-10 DIAGNOSIS — J45909 Unspecified asthma, uncomplicated: Secondary | ICD-10-CM | POA: Diagnosis not present

## 2019-09-10 DIAGNOSIS — I890 Lymphedema, not elsewhere classified: Secondary | ICD-10-CM | POA: Insufficient documentation

## 2019-09-10 NOTE — Progress Notes (Signed)
MRN : [id number]  Misty Berry is a 85 y.o. (07/24/1934) female who presents with chief complaint of No chief complaint on file. Marland Kitchen  History of Present Illness:   The patient is seen for evaluation for dialysis access. The patient has chronic renal insufficiency stage V secondary to hypertension and diabetes. The patient's most recent creatinine clearance is less than 20. The patient volume status has been an issue. Patient's blood pressures been relatively well controlled. There are mild uremic symptoms which appear to be relatively well tolerated at this time. The patient is right-handed.  The patient has been considering the various methods of dialysis and wishes to proceed with hemodialysis and therefore creation of AV access is being investigated.  The patient denies amaurosis fugax or recent TIA symptoms. There are no recent neurological changes noted. The patient denies rest pain symptoms. The patient denies history of DVT, PE or superficial thrombophlebitis. The patient denies recent episodes of angina or shortness of breath.  Vein mapping bilateral upper extremities does not show adequate superficial vein for fistula creation.  No outpatient medications have been marked as taking for the 09/10/19 encounter (Appointment) with Delana Meyer, Dolores Lory, MD.    Past Medical History:  Diagnosis Date  . Asthma   . Bruises easily   . Cellulitis   . Cough   . DDD (degenerative disc disease), lumbar   . GERD (gastroesophageal reflux disease)   . HBP (high blood pressure)   . Hypercholesterolemia   . Hyperparathyroidism ([place])   . Osteoarthritis   . Osteoporosis   . Rash   . Renal disorder    kidney disease  . Swelling     Past Surgical History:  Procedure Laterality Date  . APPENDECTOMY    . BREAST REDUCTION SURGERY    . CHOLECYSTECTOMY    . EYE SURGERY Left   . FEMUR SURGERY    . FOOT SURGERY Right   . GALLBLADDER SURGERY    . LIPECTOMY    . PAROTID GLAND TUMOR  EXCISION    . THYROID SURGERY    . TONSILLECTOMY    . TUBAL LIGATION    . VITRECTOMY      Social History Social History   Tobacco Use  . Smoking status: Never Smoker  . Smokeless tobacco: Never Used  Substance Use Topics  . Alcohol use: No  . Drug use: No    Family History No family history of bleeding/clotting disorders, porphyria or autoimmune disease    Allergies  Allergen Reactions  . Ivp Dye [Iodinated Diagnostic Agents] Other (See Comments)  . Lactose   . Methylprednisolone Other (See Comments)    Reaction: Hallucinations and Psychosis  . Peanuts [Peanut Oil] Other (See Comments)    Swelling and vomitting  . Vancomycin Other (See Comments)    Reaction: unknown  . Wellbutrin [Bupropion] Other (See Comments)    Hallucinations     REVIEW OF SYSTEMS (Negative unless checked)  Constitutional: [] Weight loss  [] Fever  [] Chills Cardiac: [] Chest pain   [] Chest pressure   [] Palpitations   [] Shortness of breath when laying flat   [] Shortness of breath with exertion. Vascular:  [] Pain in legs with walking   [] Pain in legs at rest  [] History of DVT   [] Phlebitis   [] Swelling in legs   [] Varicose veins   [] Non-healing ulcers Pulmonary:   [] Uses home oxygen   [] Productive cough   [] Hemoptysis   [] Wheeze  [] COPD   [x] Asthma Neurologic:  [] Dizziness   [] Seizures   []   History of stroke   [] History of TIA  [] Aphasia   [] Vissual changes   [] Weakness or numbness in arm   [x] Weakness or numbness in leg Musculoskeletal:   [] Joint swelling   [] Joint pain   [] Low back pain Hematologic:  [x] Easy bruising  [] Easy bleeding   [] Hypercoagulable state   [] Anemic Gastrointestinal:  [] Diarrhea   [] Vomiting  [] Gastroesophageal reflux/heartburn   [] Difficulty swallowing. Genitourinary:  [] Chronic kidney disease   [] Difficult urination  [] Frequent urination   [] Blood in urine Skin:  [x] Rashes   [] Ulcers  Psychological:  [] History of anxiety   []  History of major depression.  Physical  Examination  There were no vitals filed for this visit. There is no height or weight on file to calculate BMI. Gen: WD/WN, NAD seen in a wheelchair Head: [place]/AT, No temporalis wasting.  Ear/Nose/Throat: Hearing grossly intact, nares w/o erythema or drainage, poor dentition Eyes: sclera nonicteric.  Neck: Supple, no masses.  No bruit or JVD.  Pulmonary:  Good air movement, clear to auscultation bilaterally, no use of accessory muscles.  Cardiac: RRR, normal S1, S2, no Murmurs. Vascular: There is 4+ pitting edema bilateral lower extremities diffuse ecchymoses noted with moderate venous stasis changes noted bilaterally no open wounds of the lower extremities at this time.  The upper extremities demonstrate numerous bruises.  There are several superficial skin tears on the right forearm there is one scabbed area on the left upper arm.  These are uninfected.  Superficial veins are not identifiable on examination Vessel Right Left  Radial Palpable Palpable  Brachial Palpable Palpable  Gastrointestinal: soft, non-distended. No guarding/no peritoneal signs.  Musculoskeletal: M/S 5/5 arms 3/5 legs.  No deformity or atrophy.  Neurologic: CN 2-12 intact. Pain and light touch intact in extremities.  Symmetrical.  Speech is fluent. Motor exam as listed above. Psychiatric: Judgment intact, Mood & affect appropriate for pt's clinical situation. Dermatologic: No rashes or ulcers noted.  No changes consistent with cellulitis.  CBC Lab Results  Component Value Date   WBC 6.6 08/13/2019   HGB 8.3 (L) 08/13/2019   HCT 26.3 (L) 08/13/2019   MCV 88.9 08/13/2019   PLT 160 08/13/2019    BMET    Component Value Date/Time   NA 141 07/02/2019 0523   NA 141 04/23/2013 0534   K 4.5 07/02/2019 0523   K 3.9 04/23/2013 0534   CL 106 07/02/2019 0523   CL 112 (H) 04/23/2013 0534   CO2 24 07/02/2019 0523   CO2 23 04/23/2013 0534   GLUCOSE 92 07/02/2019 0523   GLUCOSE 92 04/23/2013 0534   BUN 68 (H)  07/02/2019 0523   BUN 31 (H) 04/23/2013 0534   CREATININE 3.40 (H) 07/02/2019 0523   CREATININE 1.78 (H) 04/23/2013 0534   CALCIUM 8.8 (L) 07/02/2019 0523   CALCIUM 8.6 04/23/2013 0534   GFRNONAA 12 (L) 07/02/2019 0523   GFRNONAA 27 (L) 04/23/2013 0534   GFRAA 14 (L) 07/02/2019 0523   GFRAA 32 (L) 04/23/2013 0534   CrCl cannot be calculated (Patient's most recent lab result is older than the maximum 21 days allowed.).  COAG Lab Results  Component Value Date   INR 1.0 02/04/2013    Radiology No results found.  Assessment/Plan 1. CKD (chronic kidney disease), stage V ([place]) I discussed with the patient the various options for dialysis access.  Vein mapping does not identify a suitable vein for creation of a fistula.  I therefore have recommended a left brachial axillary AV graft.  Given that she  is not on dialysis and there are no imminent plans to initiate dialysis I would not move forward with placement of a graft at this time.  I have asked that she follow-up with me once a more definitive start date has been made.  2. Lymphedema I have had a long discussion with the patient regarding swelling and why it  causes symptoms.  Patient will begin wearing graduated compression wraps (20-30 mmHg) on a daily basis a prescription was given. The patient will  beginning wearing the stockings first thing in the morning and removing them in the evening. The patient is instructed specifically not to sleep in the stockings.   In addition, behavioral modification will be initiated.  This will include frequent elevation, use of over the counter pain medications and exercise as tolerated.  3. Essential hypertension Continue antihypertensive medications as already ordered, these medications have been reviewed and there are no changes at this time.   4. Uncomplicated asthma, unspecified asthma severity, unspecified whether persistent Continue pulmonary medications and aerosols as already ordered,  these medications have been reviewed and there are no changes at this time.      Hortencia Pilar, MD  09/10/2019 8:33 AM

## 2019-09-24 ENCOUNTER — Telehealth (INDEPENDENT_AMBULATORY_CARE_PROVIDER_SITE_OTHER): Payer: Self-pay

## 2019-09-24 ENCOUNTER — Telehealth (INDEPENDENT_AMBULATORY_CARE_PROVIDER_SITE_OTHER): Payer: Self-pay | Admitting: Vascular Surgery

## 2019-09-24 NOTE — Telephone Encounter (Signed)
Home health nurse left a voicemail requesting orders for compression wraps seeing if it was for 2 or 3 compression wrap and how often to place. I informed the nurse with information from provider note and she ask me to fax the Rx for compression to [facility].Rx will be faxed over as requested

## 2019-09-24 NOTE — Telephone Encounter (Signed)
Brother called stating that he would like to ask questions about his sister's visit with [place] on March 29th 2021 and would like for someone to call him.

## 2019-09-24 NOTE — Telephone Encounter (Signed)
I spoke with the patient brother and he wanted to make the provider aware that his sister picks her skin and was concern about the area of where the graft will be placed. I spoke with Dr Delana Meyer and he did put in his note that he notice the scabs on the patient arms and at this time the patient is on holding pattern until she get close to starting dialysis. Patient brother has been made aware with medical advice.

## 2019-09-24 NOTE — Telephone Encounter (Signed)
Home health nurse called requesting verbal order to place unna boot for right le twice weekly due to weeping edema and use compression stocking on left leg. I spoke Eulogio Ditch NP and she is fine with orders. The nurse has been made aware that she start requesting orders.

## 2019-09-25 ENCOUNTER — Encounter (INDEPENDENT_AMBULATORY_CARE_PROVIDER_SITE_OTHER): Payer: Self-pay

## 2019-09-28 ENCOUNTER — Emergency Department: Payer: Medicare Other

## 2019-09-28 ENCOUNTER — Other Ambulatory Visit: Payer: Self-pay

## 2019-09-28 ENCOUNTER — Inpatient Hospital Stay
Admission: EM | Admit: 2019-09-28 | Discharge: 2019-10-06 | DRG: 291 | Disposition: A | Discharge status: Skilled Nursing Facility | Payer: Medicare Other | Source: Skilled Nursing Facility | Attending: Internal Medicine | Admitting: Internal Medicine

## 2019-09-28 DIAGNOSIS — K219 Gastro-esophageal reflux disease without esophagitis: Secondary | ICD-10-CM | POA: Diagnosis present

## 2019-09-28 DIAGNOSIS — N185 Chronic kidney disease, stage 5: Secondary | ICD-10-CM | POA: Diagnosis not present

## 2019-09-28 DIAGNOSIS — N2581 Secondary hyperparathyroidism of renal origin: Secondary | ICD-10-CM | POA: Diagnosis present

## 2019-09-28 DIAGNOSIS — N186 End stage renal disease: Secondary | ICD-10-CM | POA: Diagnosis present

## 2019-09-28 DIAGNOSIS — H5461 Unqualified visual loss, right eye, normal vision left eye: Secondary | ICD-10-CM | POA: Diagnosis present

## 2019-09-28 DIAGNOSIS — R0902 Hypoxemia: Secondary | ICD-10-CM | POA: Diagnosis present

## 2019-09-28 DIAGNOSIS — Z79899 Other long term (current) drug therapy: Secondary | ICD-10-CM

## 2019-09-28 DIAGNOSIS — R609 Edema, unspecified: Secondary | ICD-10-CM | POA: Diagnosis not present

## 2019-09-28 DIAGNOSIS — E213 Hyperparathyroidism, unspecified: Secondary | ICD-10-CM | POA: Diagnosis present

## 2019-09-28 DIAGNOSIS — E7800 Pure hypercholesterolemia, unspecified: Secondary | ICD-10-CM | POA: Diagnosis present

## 2019-09-28 DIAGNOSIS — N189 Chronic kidney disease, unspecified: Secondary | ICD-10-CM

## 2019-09-28 DIAGNOSIS — I5023 Acute on chronic systolic (congestive) heart failure: Secondary | ICD-10-CM

## 2019-09-28 DIAGNOSIS — F0150 Vascular dementia without behavioral disturbance: Secondary | ICD-10-CM | POA: Diagnosis present

## 2019-09-28 DIAGNOSIS — Z66 Do not resuscitate: Secondary | ICD-10-CM | POA: Diagnosis present

## 2019-09-28 DIAGNOSIS — J45909 Unspecified asthma, uncomplicated: Secondary | ICD-10-CM | POA: Diagnosis present

## 2019-09-28 DIAGNOSIS — I10 Essential (primary) hypertension: Secondary | ICD-10-CM | POA: Diagnosis not present

## 2019-09-28 DIAGNOSIS — Z7189 Other specified counseling: Secondary | ICD-10-CM

## 2019-09-28 DIAGNOSIS — R0609 Other forms of dyspnea: Secondary | ICD-10-CM

## 2019-09-28 DIAGNOSIS — I1310 Hypertensive heart and chronic kidney disease without heart failure, with stage 1 through stage 4 chronic kidney disease, or unspecified chronic kidney disease: Secondary | ICD-10-CM

## 2019-09-28 DIAGNOSIS — Z20822 Contact with and (suspected) exposure to covid-19: Secondary | ICD-10-CM | POA: Diagnosis present

## 2019-09-28 DIAGNOSIS — E785 Hyperlipidemia, unspecified: Secondary | ICD-10-CM | POA: Diagnosis present

## 2019-09-28 DIAGNOSIS — M810 Age-related osteoporosis without current pathological fracture: Secondary | ICD-10-CM | POA: Diagnosis present

## 2019-09-28 DIAGNOSIS — N179 Acute kidney failure, unspecified: Secondary | ICD-10-CM | POA: Diagnosis present

## 2019-09-28 DIAGNOSIS — L03313 Cellulitis of chest wall: Secondary | ICD-10-CM | POA: Diagnosis present

## 2019-09-28 DIAGNOSIS — Z515 Encounter for palliative care: Secondary | ICD-10-CM | POA: Diagnosis not present

## 2019-09-28 DIAGNOSIS — Z7982 Long term (current) use of aspirin: Secondary | ICD-10-CM | POA: Diagnosis not present

## 2019-09-28 DIAGNOSIS — R0602 Shortness of breath: Secondary | ICD-10-CM | POA: Diagnosis present

## 2019-09-28 DIAGNOSIS — I5033 Acute on chronic diastolic (congestive) heart failure: Secondary | ICD-10-CM

## 2019-09-28 DIAGNOSIS — Z862 Personal history of diseases of the blood and blood-forming organs and certain disorders involving the immune mechanism: Secondary | ICD-10-CM

## 2019-09-28 DIAGNOSIS — I132 Hypertensive heart and chronic kidney disease with heart failure and with stage 5 chronic kidney disease, or end stage renal disease: Secondary | ICD-10-CM | POA: Diagnosis present

## 2019-09-28 DIAGNOSIS — R0600 Dyspnea, unspecified: Secondary | ICD-10-CM | POA: Diagnosis not present

## 2019-09-28 DIAGNOSIS — L03319 Cellulitis of trunk, unspecified: Secondary | ICD-10-CM | POA: Diagnosis not present

## 2019-09-28 DIAGNOSIS — H5462 Unqualified visual loss, left eye, normal vision right eye: Secondary | ICD-10-CM | POA: Diagnosis present

## 2019-09-28 DIAGNOSIS — Z992 Dependence on renal dialysis: Secondary | ICD-10-CM | POA: Diagnosis not present

## 2019-09-28 DIAGNOSIS — D631 Anemia in chronic kidney disease: Secondary | ICD-10-CM | POA: Diagnosis present

## 2019-09-28 DIAGNOSIS — L89152 Pressure ulcer of sacral region, stage 2: Secondary | ICD-10-CM | POA: Diagnosis present

## 2019-09-28 DIAGNOSIS — F329 Major depressive disorder, single episode, unspecified: Secondary | ICD-10-CM | POA: Diagnosis present

## 2019-09-28 DIAGNOSIS — I509 Heart failure, unspecified: Secondary | ICD-10-CM

## 2019-09-28 LAB — BASIC METABOLIC PANEL
Anion gap: 9 (ref 5–15)
BUN: 60 mg/dL — ABNORMAL HIGH (ref 8–23)
CO2: 24 mmol/L (ref 22–32)
Calcium: 8.2 mg/dL — ABNORMAL LOW (ref 8.9–10.3)
Chloride: 110 mmol/L (ref 98–111)
Creatinine, Ser: 3.09 mg/dL — ABNORMAL HIGH (ref 0.44–1.00)
GFR calc Af Amer: 16 mL/min — ABNORMAL LOW (ref >60)
GFR calc non Af Amer: 13 mL/min — ABNORMAL LOW (ref >60)
Glucose, Bld: 113 mg/dL — ABNORMAL HIGH (ref 70–99)
Potassium: 4.3 mmol/L (ref 3.5–5.1)
Sodium: 143 mmol/L (ref 135–145)

## 2019-09-28 LAB — CBC WITH DIFFERENTIAL/PLATELET
Abs Immature Granulocytes: 0.04 10*3/uL (ref 0.00–0.07)
Basophils Absolute: 0 10*3/uL (ref 0.0–0.1)
Basophils Relative: 0 %
Eosinophils Absolute: 0.1 10*3/uL (ref 0.0–0.5)
Eosinophils Relative: 1 %
HCT: 27.7 % — ABNORMAL LOW (ref 36.0–46.0)
Hemoglobin: 8.5 g/dL — ABNORMAL LOW (ref 12.0–15.0)
Immature Granulocytes: 0 %
Lymphocytes Relative: 6 %
Lymphs Abs: 0.6 10*3/uL — ABNORMAL LOW (ref 0.7–4.0)
MCH: 29.8 pg (ref 26.0–34.0)
MCHC: 30.7 g/dL (ref 30.0–36.0)
MCV: 97.2 fL (ref 80.0–100.0)
Monocytes Absolute: 0.5 10*3/uL (ref 0.1–1.0)
Monocytes Relative: 5 %
Neutro Abs: 8.3 10*3/uL — ABNORMAL HIGH (ref 1.7–7.7)
Neutrophils Relative %: 88 %
Platelets: 191 10*3/uL (ref 150–400)
RBC: 2.85 MIL/uL — ABNORMAL LOW (ref 3.87–5.11)
RDW: 18.3 % — ABNORMAL HIGH (ref 11.5–15.5)
WBC: 9.6 10*3/uL (ref 4.0–10.5)
nRBC: 0 % (ref 0.0–0.2)

## 2019-09-28 LAB — BRAIN NATRIURETIC PEPTIDE: B Natriuretic Peptide: 2308 pg/mL — ABNORMAL HIGH (ref 0.0–100.0)

## 2019-09-28 LAB — SARS CORONAVIRUS 2 (TAT 6-24 HRS): SARS Coronavirus 2: NEGATIVE

## 2019-09-28 LAB — TROPONIN I (HIGH SENSITIVITY)
Troponin I (High Sensitivity): 19 ng/L — ABNORMAL HIGH (ref <18)
Troponin I (High Sensitivity): 20 ng/L — ABNORMAL HIGH (ref <18)

## 2019-09-28 MED ORDER — POLYETHYLENE GLYCOL 3350 17 G PO PACK
17.0000 g | PACK | Freq: Every day | ORAL | Status: DC
  Filled 2019-09-28 (×6): qty 1

## 2019-09-28 MED ORDER — SODIUM CHLORIDE 0.9 % IV SOLN: 250.0000 mL | INTRAVENOUS | Status: DC | PRN

## 2019-09-28 MED ORDER — BACID PO TABS
2.0000 | ORAL_TABLET | Freq: Three times a day (TID) | ORAL | Status: DC
  Filled 2019-09-28 (×3): qty 2

## 2019-09-28 MED ORDER — METOPROLOL SUCCINATE ER 25 MG PO TB24
25.0000 mg | ORAL_TABLET | Freq: Every day | ORAL | Status: DC
  Administered 2019-09-29 – 2019-10-06 (×8): 25 mg via ORAL
  Filled 2019-09-28 (×8): qty 1

## 2019-09-28 MED ORDER — HEPARIN SODIUM (PORCINE) 5000 UNIT/ML IJ SOLN
5000.0000 [IU] | Freq: Three times a day (TID) | INTRAMUSCULAR | Status: DC
  Administered 2019-09-29 – 2019-10-06 (×21): 5000 [IU] via SUBCUTANEOUS
  Filled 2019-09-28 (×21): qty 1

## 2019-09-28 MED ORDER — ACETAMINOPHEN 325 MG PO TABS
650.0000 mg | ORAL_TABLET | ORAL | Status: DC | PRN
  Administered 2019-09-30: 650 mg via ORAL
  Filled 2019-09-28: qty 2

## 2019-09-28 MED ORDER — SAM-E 200 MG PO TABS: 400.0000 mg | ORAL_TABLET | Freq: Every day | ORAL | Status: DC

## 2019-09-28 MED ORDER — ASPIRIN EC 81 MG PO TBEC
81.0000 mg | DELAYED_RELEASE_TABLET | Freq: Every day | ORAL | Status: DC
  Administered 2019-09-29 – 2019-10-06 (×8): 81 mg via ORAL
  Filled 2019-09-28 (×8): qty 1

## 2019-09-28 MED ORDER — FERROUS GLUCONATE 324 (38 FE) MG PO TABS
324.0000 mg | ORAL_TABLET | Freq: Every day | ORAL | Status: DC
  Administered 2019-09-29 – 2019-10-06 (×8): 324 mg via ORAL
  Filled 2019-09-28 (×8): qty 1

## 2019-09-28 MED ORDER — TRAMADOL HCL 50 MG PO TABS: 50.0000 mg | ORAL_TABLET | Freq: Four times a day (QID) | ORAL | Status: DC | PRN

## 2019-09-28 MED ORDER — HYDRALAZINE HCL 20 MG/ML IJ SOLN
10.0000 mg | Freq: Once | INTRAMUSCULAR | Status: AC
  Administered 2019-09-28: 10 mg via INTRAVENOUS
  Filled 2019-09-28: qty 1

## 2019-09-28 MED ORDER — CEPHALEXIN 500 MG PO CAPS
500.0000 mg | ORAL_CAPSULE | Freq: Three times a day (TID) | ORAL | Status: DC
  Administered 2019-09-28 – 2019-10-01 (×8): 500 mg via ORAL
  Filled 2019-09-28 (×8): qty 1

## 2019-09-28 MED ORDER — SERTRALINE HCL 50 MG PO TABS
200.0000 mg | ORAL_TABLET | Freq: Every day | ORAL | Status: DC
  Administered 2019-09-29 – 2019-10-06 (×8): 200 mg via ORAL
  Filled 2019-09-28 (×8): qty 4

## 2019-09-28 MED ORDER — TRAMADOL HCL 50 MG PO TABS
25.0000 mg | ORAL_TABLET | Freq: Four times a day (QID) | ORAL | Status: DC | PRN
  Administered 2019-10-01 – 2019-10-03 (×3): 25 mg via ORAL
  Filled 2019-09-28 (×3): qty 1

## 2019-09-28 MED ORDER — FUROSEMIDE 10 MG/ML IJ SOLN
40.0000 mg | Freq: Once | INTRAMUSCULAR | Status: AC
  Administered 2019-09-28: 40 mg via INTRAVENOUS
  Filled 2019-09-28: qty 4

## 2019-09-28 MED ORDER — SODIUM CHLORIDE 0.9% FLUSH: 3.0000 mL | INTRAVENOUS | Status: DC | PRN

## 2019-09-28 MED ORDER — RISAQUAD PO CAPS
2.0000 | ORAL_CAPSULE | Freq: Three times a day (TID) | ORAL | Status: DC
  Administered 2019-09-29 – 2019-10-06 (×17): 2 via ORAL
  Filled 2019-09-28 (×26): qty 2

## 2019-09-28 MED ORDER — DOCUSATE SODIUM 100 MG PO CAPS
100.0000 mg | ORAL_CAPSULE | Freq: Two times a day (BID) | ORAL | Status: DC
  Administered 2019-09-29 – 2019-10-06 (×3): 100 mg via ORAL
  Filled 2019-09-28 (×15): qty 1

## 2019-09-28 MED ORDER — OMEGA-3-ACID ETHYL ESTERS 1 G PO CAPS
1000.0000 mg | ORAL_CAPSULE | Freq: Every day | ORAL | Status: DC
  Administered 2019-09-29 – 2019-10-06 (×8): 1000 mg via ORAL
  Filled 2019-09-28 (×8): qty 1

## 2019-09-28 MED ORDER — CALCIUM CARBONATE-VITAMIN D 500-200 MG-UNIT PO TABS
1.0000 | ORAL_TABLET | Freq: Every day | ORAL | Status: DC
  Administered 2019-09-29 – 2019-10-06 (×8): 1 via ORAL
  Filled 2019-09-28 (×8): qty 1

## 2019-09-28 MED ORDER — BUSPIRONE HCL 10 MG PO TABS
5.0000 mg | ORAL_TABLET | Freq: Every day | ORAL | Status: DC
  Administered 2019-09-29 – 2019-10-06 (×8): 5 mg via ORAL
  Filled 2019-09-28 (×8): qty 1

## 2019-09-28 MED ORDER — BUMETANIDE 0.25 MG/ML IJ SOLN
1.0000 mg | Freq: Two times a day (BID) | INTRAMUSCULAR | Status: DC
  Administered 2019-09-28 – 2019-09-30 (×5): 1 mg via INTRAVENOUS
  Filled 2019-09-28 (×7): qty 4

## 2019-09-28 MED ORDER — VITAMIN B-12 1000 MCG PO TABS
1000.0000 ug | ORAL_TABLET | Freq: Every day | ORAL | Status: DC
  Administered 2019-09-29 – 2019-10-06 (×8): 1000 ug via ORAL
  Filled 2019-09-28 (×8): qty 1

## 2019-09-28 MED ORDER — ONDANSETRON HCL 4 MG/2ML IJ SOLN: 4.0000 mg | Freq: Four times a day (QID) | INTRAMUSCULAR | Status: DC | PRN

## 2019-09-28 MED ORDER — SODIUM CHLORIDE 0.9% FLUSH
3.0000 mL | Freq: Two times a day (BID) | INTRAVENOUS | Status: DC
  Administered 2019-09-29 – 2019-10-05 (×12): 3 mL via INTRAVENOUS

## 2019-09-28 MED ORDER — HYDRALAZINE HCL 20 MG/ML IJ SOLN: 10.0000 mg | Freq: Four times a day (QID) | INTRAMUSCULAR | Status: DC | PRN

## 2019-09-28 MED ORDER — PANTOPRAZOLE SODIUM 40 MG PO TBEC
40.0000 mg | DELAYED_RELEASE_TABLET | Freq: Every day | ORAL | Status: DC
  Administered 2019-09-29 – 2019-10-06 (×8): 40 mg via ORAL
  Filled 2019-09-28 (×8): qty 1

## 2019-09-28 NOTE — ED Notes (Signed)
Pt provided w/ dinner tray at bedside.

## 2019-09-28 NOTE — ED Provider Notes (Signed)
[facility] Emergency Department Provider Note   ____________________________________________   First MD Initiated Contact with Patient 09/28/19 1453     (approximate)  I have reviewed the triage vital signs and the nursing notes.   HISTORY  Chief Complaint Shortness of Breath    HPI Misty Berry is a 85 y.o. female with past medical history of CKD, CHF, asthma, and hypertension who presents to the ED complaining of shortness of breath.  Patient reports she has been increasingly short of breath over the past couple of days, particularly with exertion.  She denies any fevers, cough, or chest pain, but does feel like her legs have been more swollen than usual.  EMS was called to [facility], where patient resides, and found patient to have O2 sat of 88% on room air.  She was subsequently placed on 2 L nasal cannula with improvement in O2 sats but continues to feel short of breath on arrival.  Patient has had worsening CKD in the past and is currently in the process of determining whether she would like dialysis.  She does state that she has been making urine as usual.        Past Medical History:  Diagnosis Date  . Asthma   . Bruises easily   . Cellulitis   . Cough   . DDD (degenerative disc disease), lumbar   . GERD (gastroesophageal reflux disease)   . HBP (high blood pressure)   . Hypercholesterolemia   . Hyperparathyroidism ([place])   . Osteoarthritis   . Osteoporosis   . Rash   . Renal disorder    kidney disease  . Swelling     Patient Active Problem List   Diagnosis Date Noted  . CHF (congestive heart failure) ([place]) 09/28/2019  . Lymphedema 09/10/2019  . Iron deficiency anemia 08/07/2019  . Anemia in stage 4 chronic kidney disease ([place]) 08/07/2019  . Acute respiratory failure with hypoxia ([place]) 07/02/2019  . Anemia of chronic kidney failure, stage 5 ([place]) 07/02/2019  . Dyspnea   . Hypoxemia 06/27/2019  . Acute on chronic systolic  (congestive) heart failure ([place]) 06/27/2019  . Acid reflux 08/27/2015  . Cellulitis 08/27/2015  . Abnormal bruising 08/27/2015  . Arthritis, degenerative 08/27/2015  . OP (osteoporosis) 08/27/2015  . Presence of artificial eye 08/27/2015  . History of surgical procedure 08/27/2015  . Hypertensive heart disease with stage 4 chronic kidney disease ([place]) 04/30/2015  . Laceration of forearm without foreign body 04/30/2015  . Venous insufficiency of leg 02/28/2015  . Gluteal tendinitis 01/22/2015  . Degenerative arthritis of lumbar spine 12/12/2014  . Back muscle spasm 11/14/2014  . Degeneration of intervertebral disc of lumbar region 11/14/2014  . Neuritis or radiculitis due to rupture of lumbar intervertebral disc 11/14/2014  . Allergic rhinitis 08/31/2014  . Clinical depression 02/19/2014  . Gait instability 02/19/2014  . Benign hypertensive heart and kidney disease 10/31/2013  . Left heart failure ([place]) 10/31/2013  . Branch retinal vein occlusion 09/11/2013  . Asthma 06/20/2013  . Essential hypertension 06/20/2013  . Follow-up examination 04/30/2013  . Acute renal failure ([place]) 02/16/2013  . Fall 02/16/2013  . Closed fracture of part of neck of femur ([place]) 02/16/2013  . Cellulitis and abscess of leg 10/30/2012  . Cellulitis of lower extremity 10/30/2012  . CKD (chronic kidney disease), stage V ([place]) 10/30/2012  . Central retinal edema, cystoid 01/31/2012  . Impaired vision 12/17/2011    Past Surgical History:  Procedure Laterality Date  . APPENDECTOMY    .  BREAST REDUCTION SURGERY    . CHOLECYSTECTOMY    . EYE SURGERY Left   . FEMUR SURGERY    . FOOT SURGERY Right   . GALLBLADDER SURGERY    . LIPECTOMY    . PAROTID GLAND TUMOR EXCISION    . THYROID SURGERY    . TONSILLECTOMY    . TUBAL LIGATION    . VITRECTOMY      Prior to Admission medications   Medication Sig Start Date End Date Taking? Authorizing Provider  aspirin EC 81 MG tablet Take 81 mg by mouth daily.     Yes [provider]  busPIRone (BUSPAR) 5 MG tablet  08/16/19  Yes [provider]  calcium-vitamin D (OSCAL WITH D) 500-200 MG-UNIT TABS tablet Take 1 tablet by mouth daily.    Yes [provider]  calcium-vitamin D (OSCAL WITH D) 500-200 MG-UNIT TABS tablet Take by mouth.   Yes [provider]  clobetasol ointment (TEMOVATE) 6.29 % Apply 1 application topically 2 (two) times daily. To shins   Yes [provider]  docusate sodium (COLACE) 100 MG capsule Take 1 capsule (100 mg total) by mouth 2 (two) times daily. 07/02/19  Yes Danford, Suann Larry, MD  esomeprazole (NEXIUM) 40 MG capsule Take 40 mg by mouth daily.    Yes [provider]  ferrous gluconate (FERGON) 324 MG tablet Take 324 mg by mouth daily.    Yes [provider]  L-Methylfolate-Algae (DEPLIN 15) 15-90.314 MG CAPS Take 15 mg by mouth daily.    Yes [provider]  L-Theanine 100 MG CAPS Take 800 mg by mouth daily.    Yes [provider]  metoprolol succinate (TOPROL-XL) 25 MG 24 hr tablet Take 1 tablet (25 mg total) by mouth daily. 07/03/19  Yes Danford, Suann Larry, MD  MILK THISTLE PO Take 1 tablet by mouth daily.    Yes [provider]  Multiple Vitamins-Minerals (CENTRUM SILVER ADULT 50+) TABS Take 1 tablet by mouth daily.   Yes [provider]  Omega-3 Fatty Acids (FISH OIL) 1000 MG CAPS Take 4,000 mg by mouth daily.    Yes [provider]  Omega-3 Fatty Acids (FISH OIL) 1000 MG CAPS Take by mouth.   Yes [provider]  polyethylene glycol (MIRALAX / GLYCOLAX) 17 g packet Take 17 g by mouth daily. 07/03/19  Yes Danford, Suann Larry, MD  S-Adenosylmethionine (SAM-E) 200 MG TABS Take 400 mg by mouth daily.    Yes [provider]  torsemide (DEMADEX) 20 MG tablet Take 20 mg by mouth daily.  09/16/17  Yes [provider]  traMADol (ULTRAM) 50 MG tablet Take 1 tablet (50 mg total) by mouth every 6  (six) hours as needed for moderate pain. 07/02/19  Yes Danford, Suann Larry, MD  traMADol Veatrice [place]) 50 MG tablet  08/16/19  Yes [provider]  vitamin B-12 (CYANOCOBALAMIN) 1000 MCG tablet Take 1,000 mcg by mouth daily.    Yes [provider]  ZOLOFT 100 MG tablet Take 200 mg by mouth daily.  06/07/15  Yes [provider]  amoxicillin-clavulanate (AUGMENTIN) 250-125 MG tablet SMARTSIG:1 Tablet(s) By Mouth Every 12 Hours 08/30/19   [provider]    Allergies Ivp dye [iodinated diagnostic agents], Lactose, Methylprednisolone, Peanuts [peanut oil], Vancomycin, and Wellbutrin [bupropion]  No family history on file.  Social History Social History   Tobacco Use  . Smoking status: Never Smoker  . Smokeless tobacco: Never Used  Substance Use Topics  .  Alcohol use: No  . Drug use: No    Review of Systems  Constitutional: No fever/chills Eyes: No visual changes. ENT: No sore throat. Cardiovascular: Denies chest pain. Respiratory: Positive for shortness of breath. Gastrointestinal: No abdominal pain.  No nausea, no vomiting.  No diarrhea.  No constipation. Genitourinary: Negative for dysuria. Musculoskeletal: Negative for back pain.  Positive for leg swelling. Skin: Negative for rash. Neurological: Negative for headaches, focal weakness or numbness.  ____________________________________________   PHYSICAL EXAM:  VITAL SIGNS: ED Triage Vitals  Enc Vitals Group     BP --      Pulse --      Resp --      Temp --      Temp src --      SpO2 --      Weight 09/28/19 1501 175 lb (79.4 kg)     Height 09/28/19 1501 5\' 6"  (1.676 m)     Head Circumference --      Peak Flow --      Pain Score 09/28/19 1500 0     Pain Loc --      Pain Edu? --      Excl. in [place]? --     Constitutional: Alert and oriented. Eyes: Conjunctivae are normal. Head: Atraumatic. Nose: No congestion/rhinnorhea. Mouth/Throat: Mucous membranes are moist. Neck: Normal  ROM Cardiovascular: Normal rate, regular rhythm. Grossly normal heart sounds. Respiratory: Tachypneic with increased respiratory effort.  No retractions.  Crackles to bilateral bases. Gastrointestinal: Soft and nontender. No distention. Genitourinary: deferred Musculoskeletal: No lower extremity tenderness, 2+ pitting edema to bilateral lower extremities. Neurologic:  Normal speech and language. No gross focal neurologic deficits are appreciated. Skin:  Skin is warm, dry and intact. No rash noted. Psychiatric: Mood and affect are normal. Speech and behavior are normal.  ____________________________________________   LABS (all labs ordered are listed, but only abnormal results are displayed)  Labs Reviewed  CBC WITH DIFFERENTIAL/PLATELET - Abnormal; Notable for the following components:      Result Value   RBC 2.85 (*)    Hemoglobin 8.5 (*)    HCT 27.7 (*)    RDW 18.3 (*)    Neutro Abs 8.3 (*)    Lymphs Abs 0.6 (*)    All other components within normal limits  BASIC METABOLIC PANEL - Abnormal; Notable for the following components:   Glucose, Bld 113 (*)    BUN 60 (*)    Creatinine, Ser 3.09 (*)    Calcium 8.2 (*)    GFR calc non Af Amer 13 (*)    GFR calc Af Amer 16 (*)    All other components within normal limits  BRAIN NATRIURETIC PEPTIDE - Abnormal; Notable for the following components:   B Natriuretic Peptide 2,308.0 (*)    All other components within normal limits  TROPONIN I (HIGH SENSITIVITY) - Abnormal; Notable for the following components:   Troponin I (High Sensitivity) 19 (*)    All other components within normal limits  TROPONIN I (HIGH SENSITIVITY) - Abnormal; Notable for the following components:   Troponin I (High Sensitivity) 20 (*)    All other components within normal limits  SARS CORONAVIRUS 2 (TAT 6-24 HRS)  BASIC METABOLIC PANEL  CBC   ____________________________________________  EKG  ED ECG REPORT I, Blake Divine, the attending physician,  personally viewed and interpreted this ECG.   Date: 09/28/2019  EKG Time: 15:44  Rate: 65  Rhythm: normal sinus rhythm  Axis: Normal  Intervals:none  ST&T Change: None   PROCEDURES  Procedure(s) performed (including Critical Care):  .Critical Care Performed by: Blake Divine, MD Authorized by: Blake Divine, MD   Critical care provider statement:    Critical care time (minutes):  45   Critical care time was exclusive of:  Separately billable procedures and treating other patients and teaching time   Critical care was necessary to treat or prevent imminent or life-threatening deterioration of the following conditions:  Respiratory failure   Critical care was time spent personally by me on the following activities:  Discussions with consultants, evaluation of patient's response to treatment, examination of patient, ordering and performing treatments and interventions, ordering and review of laboratory studies, ordering and review of radiographic studies, pulse oximetry, re-evaluation of patient's condition, obtaining history from patient or surrogate and review of old charts   I assumed direction of critical care for this patient from another provider in my specialty: no   .1-3 Lead EKG Interpretation Performed by: Blake Divine, MD Authorized by: Blake Divine, MD     Interpretation: normal     ECG rate:  65   ECG rate assessment: normal     Rhythm: sinus rhythm     Ectopy: none     Conduction: normal       ____________________________________________   INITIAL IMPRESSION / ASSESSMENT AND PLAN / ED COURSE       85 year old female presents to the ED with increased shortness of breath over the past 2 to 3 days, noted to have O2 sat of 88% by EMS that improved with 2 L nasal cannula.  She appears fluid overloaded on exam with crackles at bases.  Chest x-ray consistent with pulmonary edema and we will diurese with IV Lasix.  Given her hypoxia and chronic kidney  disease, she would benefit from admission for careful diuresis.  EKG shows no evidence of ischemia or arrhythmia and very mildly elevated but stable on recheck.      ____________________________________________   FINAL CLINICAL IMPRESSION(S) / ED DIAGNOSES  Final diagnoses:  Acute on chronic diastolic congestive heart failure (HCC)  Shortness of breath     ED Discharge Orders         Ordered    Amb Referral to HF Clinic     09/28/19 1731           Note:  This document was prepared using Dragon voice recognition software and may include unintentional dictation errors.   Blake Divine, MD 09/28/19 2035

## 2019-09-28 NOTE — ED Notes (Signed)
Pt trx to x-ray 

## 2019-09-28 NOTE — Progress Notes (Signed)
PHARMACIST - PHYSICIAN ORDER COMMUNICATION  CONCERNING: P&T Medication Policy on Herbal Medications  DESCRIPTION:  This patient's order for:  Sam-E capsules  has been noted.  This product(s) is classified as an "herbal" or natural product. Due to a lack of definitive safety studies or FDA approval, nonstandard manufacturing practices, plus the potential risk of unknown drug-drug interactions while on inpatient medications, the Pharmacy and Therapeutics Committee does not permit the use of "herbal" or natural products of this type within [facility].   ACTION TAKEN: The pharmacy department is unable to verify this order at this time and your patient has been informed of this safety policy. Please reevaluate patient's clinical condition at discharge and address if the herbal or natural product(s) should be resumed at that time.

## 2019-09-28 NOTE — H&P (Addendum)
History and Physical    Misty Berry HFW:[id number] DOB: 05-09-1935 DOA: 09/28/2019  PCP: Nelia Shi, MD  Patient coming from: [facility]   Chief Complaint: Dyspnea on exertion  HPI: Misty Berry is a 85 y.o. female with medical history significant of chronic kidney disease stage IV, anemia of chronic kidney disease, left eye blindness osteoarthritis, GERD, chronic systolic heart failure with echo on January 2021 revealing EF of 35 to 40% presents with complaints of dyspnea on exertion that started yesterday.  She is unable to tell me how much she can walk before she becomes short of breath because she has not been paying attention.  Denies orthopnea.  Denies chest pain or chest pressure.  She does not remember what she ate yesterday or whether she had a salty food.  Denies any fever, chills, dizziness, or lightheadedness. ED Course: In the ER she was found with BNP of 2300, creatinine of 3.09, hemoglobin 8.5, hematocrit 27.7, initial blood pressure was 200/91, retake was 176/95, heart rate 67.  Per ER nursing note, patient had told staff that she had a hard time breathing for the past couple of days and upon arrival patient had oxygen saturation of 88%.  EMS placed her on 4 L nasal cannula and O2 sats increased to 98%.  She did receive 2 sublingual nitros.  EMS had reported bilateral crackles in the lower lobes.  Chest x-ray was read as mild bibasilar atelectasis.  Small bilateral pleural effusion.  Aortic calcification.  Cardiomegaly.  Review of Systems: All systems reviewed and otherwise negative.    Past Medical History:  Diagnosis Date  . Asthma   . Bruises easily   . Cellulitis   . Cough   . DDD (degenerative disc disease), lumbar   . GERD (gastroesophageal reflux disease)   . HBP (high blood pressure)   . Hypercholesterolemia   . Hyperparathyroidism ([place])   . Osteoarthritis   . Osteoporosis   . Rash   . Renal disorder    kidney disease  . Swelling      Past Surgical History:  Procedure Laterality Date  . APPENDECTOMY    . BREAST REDUCTION SURGERY    . CHOLECYSTECTOMY    . EYE SURGERY Left   . FEMUR SURGERY    . FOOT SURGERY Right   . GALLBLADDER SURGERY    . LIPECTOMY    . PAROTID GLAND TUMOR EXCISION    . THYROID SURGERY    . TONSILLECTOMY    . TUBAL LIGATION    . VITRECTOMY       reports that she has never smoked. She has never used smokeless tobacco. She reports that she does not drink alcohol or use drugs.  Allergies  Allergen Reactions  . Ivp Dye [Iodinated Diagnostic Agents] Other (See Comments)  . Lactose   . Methylprednisolone Other (See Comments)    Reaction: Hallucinations and Psychosis  . Peanuts [Peanut Oil] Other (See Comments)    Swelling and vomitting  . Vancomycin Other (See Comments)    Reaction: unknown  . Wellbutrin [Bupropion] Other (See Comments)    Hallucinations    No family history on file.   Prior to Admission medications   Medication Sig Start Date End Date Taking? Authorizing Provider  aspirin EC 81 MG tablet Take 81 mg by mouth daily.    Yes [provider]  busPIRone (BUSPAR) 5 MG tablet  08/16/19  Yes [provider]  calcium-vitamin D (OSCAL WITH D) 500-200 MG-UNIT TABS tablet Take  1 tablet by mouth daily.    Yes [provider]  calcium-vitamin D (OSCAL WITH D) 500-200 MG-UNIT TABS tablet Take by mouth.   Yes [provider]  clobetasol ointment (TEMOVATE) 4.09 % Apply 1 application topically 2 (two) times daily. To shins   Yes [provider]  docusate sodium (COLACE) 100 MG capsule Take 1 capsule (100 mg total) by mouth 2 (two) times daily. 07/02/19  Yes Danford, Suann Larry, MD  esomeprazole (NEXIUM) 40 MG capsule Take 40 mg by mouth daily.    Yes [provider]  ferrous gluconate (FERGON) 324 MG tablet Take 324 mg by mouth daily.    Yes [provider]  L-Methylfolate-Algae (DEPLIN 15) 15-90.314 MG CAPS Take 15 mg  by mouth daily.    Yes [provider]  L-Theanine 100 MG CAPS Take 800 mg by mouth daily.    Yes [provider]  metoprolol succinate (TOPROL-XL) 25 MG 24 hr tablet Take 1 tablet (25 mg total) by mouth daily. 07/03/19  Yes Danford, Suann Larry, MD  MILK THISTLE PO Take 1 tablet by mouth daily.    Yes [provider]  Multiple Vitamins-Minerals (CENTRUM SILVER ADULT 50+) TABS Take 1 tablet by mouth daily.   Yes [provider]  Omega-3 Fatty Acids (FISH OIL) 1000 MG CAPS Take 4,000 mg by mouth daily.    Yes [provider]  Omega-3 Fatty Acids (FISH OIL) 1000 MG CAPS Take by mouth.   Yes [provider]  polyethylene glycol (MIRALAX / GLYCOLAX) 17 g packet Take 17 g by mouth daily. 07/03/19  Yes Danford, Suann Larry, MD  S-Adenosylmethionine (SAM-E) 200 MG TABS Take 400 mg by mouth daily.    Yes [provider]  torsemide (DEMADEX) 20 MG tablet Take 20 mg by mouth daily.  09/16/17  Yes [provider]  traMADol (ULTRAM) 50 MG tablet Take 1 tablet (50 mg total) by mouth every 6 (six) hours as needed for moderate pain. 07/02/19  Yes Danford, Suann Larry, MD  traMADol Veatrice Bourbon) 50 MG tablet  08/16/19  Yes [provider]  vitamin B-12 (CYANOCOBALAMIN) 1000 MCG tablet Take 1,000 mcg by mouth daily.    Yes [provider]  ZOLOFT 100 MG tablet Take 200 mg by mouth daily.  06/07/15  Yes [provider]  amoxicillin-clavulanate (AUGMENTIN) 250-125 MG tablet SMARTSIG:1 Tablet(s) By Mouth Every 12 Hours 08/30/19   [provider]    Physical Exam: Vitals:   09/28/19 1501 09/28/19 1600  BP:  (!) 200/91  Pulse:  64  Resp:  (!) 28  SpO2:  98%  Weight: 79.4 kg   Height: 5\' 6"  (1.676 m)     Constitutional: NAD, calm, comfortable Vitals:   09/28/19 1501 09/28/19 1600  BP:  (!) 200/91  Pulse:  64  Resp:  (!) 28  SpO2:  98%  Weight: 79.4 kg   Height: 5\' 6"  (1.676 m)    Eyes: anicteric  sclera ENMT: mask on  Neck: normal, supple +jvp Respiratory: Short of breath while talking to me.  Positive crackles   No wheezing.  Decreased breath sounds at bases . Anterior chest: mild erythema, warmth surrounding area of excuriation/scab Cardiovascular: Regular rate and rhythm, no murmurs / rubs / gallops. Abdomen: no tenderness, +fluid , mildly distended Bowel sounds positive.  Lower Extremity: 3+ pitting edema of LLE up to thigh, Rt leg wrapped (due to swelling per pt), 2+ edema upper thigh Skin: warm, dry Neurologic: awake and  alert, orient. CN grossly intact. Psychiatric: Normal judgment and insight.Normal mood.    Labs on Admission: I have personally reviewed following labs and imaging studies  CBC: Recent Labs  Lab 09/28/19 1552  WBC 9.6  NEUTROABS 8.3*  HGB 8.5*  HCT 27.7*  MCV 97.2  PLT 466   Basic Metabolic Panel: Recent Labs  Lab 09/28/19 1552  NA 143  K 4.3  CL 110  CO2 24  GLUCOSE 113*  BUN 60*  CREATININE 3.09*  CALCIUM 8.2*   GFR: Estimated Creatinine Clearance: 14.9 mL/min (A) (by C-G formula based on SCr of 3.09 mg/dL (H)). Liver Function Tests: No results for input(s): AST, ALT, ALKPHOS, BILITOT, PROT, ALBUMIN in the last 168 hours. No results for input(s): LIPASE, AMYLASE in the last 168 hours. No results for input(s): AMMONIA in the last 168 hours. Coagulation Profile: No results for input(s): INR, PROTIME in the last 168 hours. Cardiac Enzymes: No results for input(s): CKTOTAL, CKMB, CKMBINDEX, TROPONINI in the last 168 hours. BNP (last 3 results) No results for input(s): PROBNP in the last 8760 hours. HbA1C: No results for input(s): HGBA1C in the last 72 hours. CBG: No results for input(s): GLUCAP in the last 168 hours. Lipid Profile: No results for input(s): CHOL, HDL, LDLCALC, TRIG, CHOLHDL, LDLDIRECT in the last 72 hours. Thyroid Function Tests: No results for input(s): TSH, T4TOTAL, FREET4, T3FREE, THYROIDAB in the last 72  hours. Anemia Panel: No results for input(s): VITAMINB12, FOLATE, FERRITIN, TIBC, IRON, RETICCTPCT in the last 72 hours. Urine analysis:    Component Value Date/Time   COLORURINE COLORLESS (A) 06/27/2019 0456   APPEARANCEUR CLEAR (A) 06/27/2019 0456   APPEARANCEUR Clear 04/19/2013 0807   LABSPEC 1.008 06/27/2019 0456   LABSPEC 1.012 04/19/2013 0807   PHURINE 6.0 06/27/2019 0456   GLUCOSEU NEGATIVE 06/27/2019 0456   GLUCOSEU 50 mg/dL 04/19/2013 0807   HGBUR NEGATIVE 06/27/2019 0456   BILIRUBINUR NEGATIVE 06/27/2019 0456   BILIRUBINUR Negative 04/19/2013 0807   KETONESUR NEGATIVE 06/27/2019 0456   PROTEINUR 30 (A) 06/27/2019 0456   NITRITE NEGATIVE 06/27/2019 0456   LEUKOCYTESUR NEGATIVE 06/27/2019 0456   LEUKOCYTESUR Negative 04/19/2013 0807    Radiological Exams on Admission: DG Chest 2 View  Result Date: 09/28/2019 CLINICAL DATA:  Shortness of breath EXAM: CHEST - 2 VIEW COMPARISON:  06/29/2019 FINDINGS: Cardiac shadow is enlarged but stable. Aortic calcifications are again seen. Small bilateral pleural effusions are noted. Mild bibasilar atelectasis is seen stable from the prior exam. Old rib fractures are seen bilaterally. IMPRESSION: Mild bibasilar atelectasis. Electronically Signed   By: Inez Catalina M.D.   On: 09/28/2019 15:50    EKG: Independently reviewed.  Sinus rhythm, LVH, no acute ST changes  Assessment/Plan Active Problems:   CHF (congestive heart failure) ([place])    1.  Acute on chronic systolic heart failure Clinically volume overloaded, BNP elevated We will start her on Bumex 1 mg IV twice daily since she has chronic kidney disease Monitor electrolytes and kidney function Needs blood pressure control We will continue her beta-blocker, hydralazine IV as needed Strict I's and O's Daily weight Serial troponin, first 1 -  2.  Chronic kidney disease stage IV-creatinine appears to be at baseline of 3.0's Monitor while diuresing  3.  Essential  hypertension-continue home beta-blocker May need to change to Coreg once more euvolemic for better blood pressure control if heart rate tolerates We will add IV hydralazine as needed for now  4.  GERD-continue her PPI  5.  Anemia  of chronic kidney disease-currently H&H at baseline  6.  Depression continue outpatient medications  DVT prophylaxis: Heparin subcu Code Status: Full code-code verified with patient Family Communication: None at bedside Disposition Plan: Back to previous [facility] Consults called: None Admission status: Inpatient as patient requires more than 2 midnight stays   Nolberto Hanlon MD [facility] Pager [phone]   If 7PM-7AM, please contact night-coverage [email or web address] Password [facility]  09/28/2019, 5:34 PM

## 2019-09-28 NOTE — ED Triage Notes (Signed)
Pt arrives from [facility] via [facility]. Pt told staff that she had been having SOB for the past couple of days. Upon arrival pt had O2 sat of 88%, EMS put pt on 4L [place] and sat went to 98%. Pt received 2 x SL NTG. EMS reported pt had bilateral crackles in lower lobes.

## 2019-09-29 ENCOUNTER — Encounter: Payer: Self-pay | Admitting: Internal Medicine

## 2019-09-29 ENCOUNTER — Inpatient Hospital Stay: Payer: Medicare Other

## 2019-09-29 LAB — BASIC METABOLIC PANEL
Anion gap: 9 (ref 5–15)
BUN: 59 mg/dL — ABNORMAL HIGH (ref 8–23)
CO2: 28 mmol/L (ref 22–32)
Calcium: 8.6 mg/dL — ABNORMAL LOW (ref 8.9–10.3)
Chloride: 107 mmol/L (ref 98–111)
Creatinine, Ser: 3.24 mg/dL — ABNORMAL HIGH (ref 0.44–1.00)
GFR calc Af Amer: 15 mL/min — ABNORMAL LOW (ref >60)
GFR calc non Af Amer: 13 mL/min — ABNORMAL LOW (ref >60)
Glucose, Bld: 96 mg/dL (ref 70–99)
Potassium: 4.3 mmol/L (ref 3.5–5.1)
Sodium: 144 mmol/L (ref 135–145)

## 2019-09-29 LAB — BRAIN NATRIURETIC PEPTIDE: B Natriuretic Peptide: 1668 pg/mL — ABNORMAL HIGH (ref 0.0–100.0)

## 2019-09-29 MED ORDER — HYDRALAZINE HCL 10 MG PO TABS
10.0000 mg | ORAL_TABLET | Freq: Three times a day (TID) | ORAL | Status: DC
  Administered 2019-09-29 – 2019-09-30 (×3): 10 mg via ORAL
  Filled 2019-09-29 (×5): qty 1

## 2019-09-29 NOTE — Progress Notes (Signed)
PROGRESS NOTE    Misty Berry  KGU:[id number] DOB: September 24, 1934 DOA: 09/28/2019 PCP: Nelia Shi, MD    Brief Narrative:  Misty Berry is a 85 y.o. female with medical history significant of chronic kidney disease stage IV, anemia of chronic kidney disease, left eye blindness osteoarthritis, GERD, chronic systolic heart failure with echo on January 2021 revealing EF of 35 to 40% presents with complaints of dyspnea on exertion that started yesterday.  She is unable to tell me how much she can walk before she becomes short of breath because she has not been paying attention.  Denies orthopnea.  Denies chest pain or chest pressure.  She does not remember what she ate yesterday or whether she had a salty food.  Denies any fever, chills, dizziness, or lightheadedness.    Consultants:     Procedures:  Antimicrobials:      Subjective: Feels a little better. Still with sob.  Objective: Vitals:   09/28/19 2020 09/29/19 0854 09/29/19 0917 09/29/19 1507  BP: (!) 169/69 (!) 174/72 (!) 153/69 (!) 163/65  Pulse: 65 68 60 63  Resp: (!) 21 18 (!) 26 20  Temp: 98.2 F (36.8 C)  98.2 F (36.8 C) 98 F (36.7 C)  TempSrc: Oral  Oral   SpO2: 98% 100% 100% 100%  Weight:    93.3 kg  Height:    5\' 6"  (1.676 m)    Intake/Output Summary (Last 24 hours) at 09/29/2019 1523 Last data filed at 09/29/2019 0938 Gross per 24 hour  Intake 3 ml  Output 1000 ml  Net -997 ml   Filed Weights   09/28/19 1501 09/29/19 1507  Weight: 79.4 kg 93.3 kg    Examination:  General exam: Appears calm and comfortable  Respiratory system: Crackles at bases, no wheezing cardiovascular system: S1 & S2 heard, RRR. No JVD, murmurs, rubs, gallops or clicks Gastrointestinal system: Abdomen is nondistended, soft and nontender.Normal bowel sounds heard. Central nervous system: Alert and oriented.  Grossly intact Extremities: Positive edema, right lower extremity wrapped Skin: Warm  dry Psychiatry: Judgement and insight appear normal. Mood & affect appropriate.     Data Reviewed: I have personally reviewed following labs and imaging studies  CBC: Recent Labs  Lab 09/28/19 1552  WBC 9.6  NEUTROABS 8.3*  HGB 8.5*  HCT 27.7*  MCV 97.2  PLT 628   Basic Metabolic Panel: Recent Labs  Lab 09/28/19 1552 09/29/19 0614  NA 143 144  K 4.3 4.3  CL 110 107  CO2 24 28  GLUCOSE 113* 96  BUN 60* 59*  CREATININE 3.09* 3.24*  CALCIUM 8.2* 8.6*   GFR: Estimated Creatinine Clearance: 15.4 mL/min (A) (by C-G formula based on SCr of 3.24 mg/dL (H)). Liver Function Tests: No results for input(s): AST, ALT, ALKPHOS, BILITOT, PROT, ALBUMIN in the last 168 hours. No results for input(s): LIPASE, AMYLASE in the last 168 hours. No results for input(s): AMMONIA in the last 168 hours. Coagulation Profile: No results for input(s): INR, PROTIME in the last 168 hours. Cardiac Enzymes: No results for input(s): CKTOTAL, CKMB, CKMBINDEX, TROPONINI in the last 168 hours. BNP (last 3 results) No results for input(s): PROBNP in the last 8760 hours. HbA1C: No results for input(s): HGBA1C in the last 72 hours. CBG: No results for input(s): GLUCAP in the last 168 hours. Lipid Profile: No results for input(s): CHOL, HDL, LDLCALC, TRIG, CHOLHDL, LDLDIRECT in the last 72 hours. Thyroid Function Tests: No results for input(s): TSH, T4TOTAL, FREET4, T3FREE,  THYROIDAB in the last 72 hours. Anemia Panel: No results for input(s): VITAMINB12, FOLATE, FERRITIN, TIBC, IRON, RETICCTPCT in the last 72 hours. Sepsis Labs: No results for input(s): PROCALCITON, LATICACIDVEN in the last 168 hours.  Recent Results (from the past 240 hour(s))  SARS CORONAVIRUS 2 (TAT 6-24 HRS) Nasopharyngeal Nasopharyngeal Swab     Status: None   Collection Time: 09/28/19  5:27 PM   Specimen: Nasopharyngeal Swab  Result Value Ref Range Status   SARS Coronavirus 2 NEGATIVE NEGATIVE Final    Comment:  (NOTE) SARS-CoV-2 target nucleic acids are NOT DETECTED. The SARS-CoV-2 RNA is generally detectable in upper and lower respiratory specimens during the acute phase of infection. Negative results do not preclude SARS-CoV-2 infection, do not rule out co-infections with other pathogens, and should not be used as the sole basis for treatment or other patient management decisions. Negative results must be combined with clinical observations, patient history, and epidemiological information. The expected result is Negative. Fact Sheet for Patients: SugarRoll.be Fact Sheet for Healthcare Providers: https://www.woods-mathews.com/ This test is not yet approved or cleared by the [place] FDA and  has been authorized for detection and/or diagnosis of SARS-CoV-2 by FDA under an Emergency Use Authorization (EUA). This EUA will remain  in effect (meaning this test can be used) for the duration of the COVID-19 declaration under Section 56 4(b)(1) of the Act, 21 U.S.C. section 360bbb-3(b)(1), unless the authorization is terminated or revoked sooner. Performed at [facility], [place] [address] [place], [place] [address]          Radiology Studies: DG Chest 2 View  Result Date: 09/28/2019 CLINICAL DATA:  Shortness of breath EXAM: CHEST - 2 VIEW COMPARISON:  06/29/2019 FINDINGS: Cardiac shadow is enlarged but stable. Aortic calcifications are again seen. Small bilateral pleural effusions are noted. Mild bibasilar atelectasis is seen stable from the prior exam. Old rib fractures are seen bilaterally. IMPRESSION: Mild bibasilar atelectasis. Electronically Signed   By: Inez Catalina M.D.   On: 09/28/2019 15:50        Scheduled Meds: . acidophilus  2 capsule Oral TID  . aspirin EC  81 mg Oral Daily  . bumetanide (BUMEX) IV  1 mg Intravenous Q12H  . busPIRone  5 mg Oral Daily  . calcium-vitamin D  1 tablet Oral Daily  . cephALEXin  500 mg Oral  Q8H  . docusate sodium  100 mg Oral BID  . ferrous gluconate  324 mg Oral Daily  . heparin  5,000 Units Subcutaneous Q8H  . metoprolol succinate  25 mg Oral Daily  . omega-3 acid ethyl esters  1,000 mg Oral Daily  . pantoprazole  40 mg Oral Daily  . polyethylene glycol  17 g Oral Daily  . sertraline  200 mg Oral Daily  . sodium chloride flush  3 mL Intravenous Q12H  . vitamin B-12  1,000 mcg Oral Daily   Continuous Infusions: . sodium chloride      Assessment & Plan:   Active Problems:   CHF (congestive heart failure) ([place])   1.  Acute on chronic systolic heart failure Clinically volume overloaded, BNP decreasing continue on Bumex 1 mg IV twice daily since she has chronic kidney disease Monitor electrolytes and kidney function Needs blood pressure control We will continue her beta-blocker, hydralazine IV as needed Strict I's and O's Daily weight Plan: Continue diet seen, monitor volume status will check Doppler of lower extremity to rule out DVT  2.  Chronic kidney disease  stage IV-creatinine appears to be at baseline of 3.0's Monitor while diuresing  3.  Essential hypertension-continue home beta-blocker May need to change to Coreg once more euvolemic for better blood pressure control if heart rate tolerates we will add hydralazine 10 mg 3 times daily  4.  GERD-continue her PPI  5.  Anemia of chronic kidney disease-currently H&H at baseline  6.  Depression continue outpatient medications  DVT prophylaxis: Heparin subcu Code Status: Full code Family Communication: None at bedside Disposition Plan: Back to previous [facility] life barrier: Needs IV diuretics as she is still very much volume overloaded      LOS: 1 day   Time spent: 45 minutes with more than 50% COC    Nolberto Hanlon, MD [facility] Pager [phone]  If 7PM-7AM, please contact night-coverage [email or web address] Password [facility] 09/29/2019, 3:23 PM

## 2019-09-29 NOTE — Progress Notes (Signed)
Due to AMS - unable to complete admission at this time

## 2019-09-29 NOTE — Progress Notes (Signed)
Physical Therapy Evaluation Patient Details Name: Misty Berry MRN: [id number] DOB: [birth year] Today's Date: 09/29/2019   History of Present Illness  per MD note:Misty Berry is a 85 y.o. female with medical history significant of chronic kidney disease stage IV, anemia of chronic kidney disease, left eye blindness osteoarthritis, GERD, chronic systolic heart failure with echo on January 2021 revealing EF of 35 to 40% presents with complaints of dyspnea on exertion that started yesterday.  She is unable to tell me how much she can walk before she becomes short of breath because she has not been paying attention.  Denies orthopnea.  Denies chest pain or chest pressure.  She does not remember what she ate yesterday or whether she had a salty food.  Denies any fever, chills, dizziness, or lightheadedness.  Clinical Impression  Patient agrees to PT eval. She reports no pain. She has O2 saturation above 95%. She has 3/5 strength to BLE hip flex and knee extension. She needs max assist for rolling and for supine <>  Sit. She does not tolerate sitting on the edge of stretcher with respirations increasing to 40. Her sitting balance is poor and has posterior lean.  She is breathing heavy after bed mobility, transfers are not attempted due to patient not tolerating sitting on the edge of stretcher. She is educated on PT recommendation of SNF until her level of assist changes. She will benefit from skilled PT to improve mobility and strength.     Follow Up Recommendations SNF    Equipment Recommendations  Rolling walker with 5" wheels    Recommendations for Other Services       Precautions / Restrictions Restrictions Weight Bearing Restrictions: No      Mobility  Bed Mobility Overal bed mobility: Needs Assistance Bed Mobility: Rolling;Supine to Sit;Sit to Supine Rolling: Mod assist   Supine to sit: Max assist Sit to supine: Max assist   General bed mobility comments: Pt needs cues for  sequencing  Transfers Overall transfer level: (poor tolerance  sitting on stretcher increased resp rate 40)                  Ambulation/Gait                Stairs            Wheelchair Mobility    Modified Rankin (Stroke Patients Only)       Balance Overall balance assessment: Mild deficits observed, not formally tested                                           Pertinent Vitals/Pain Pain Assessment: No/denies pain    Home Living Family/patient expects to be discharged to:: Unsure                      Prior Function Level of Independence: Independent with assistive device(s)               Hand Dominance        Extremity/Trunk Assessment   Upper Extremity Assessment Upper Extremity Assessment: Overall WFL for tasks assessed    Lower Extremity Assessment Lower Extremity Assessment: RLE deficits/detail;LLE deficits/detail RLE: (3/5 hip flex, knee ex) LLE: (3/5 hip flex, knee ex)       Communication   Communication: No difficulties  Cognition Arousal/Alertness: Awake/alert Behavior During Therapy: WFL for tasks assessed/performed  Overall Cognitive Status: Within Functional Limits for tasks assessed                                        General Comments      Exercises     Assessment/Plan    PT Assessment Patient needs continued PT services  PT Problem List Decreased strength;Decreased activity tolerance;Decreased balance;Decreased safety awareness;Cardiopulmonary status limiting activity;Obesity       PT Treatment Interventions Gait training;Functional mobility training;Therapeutic activities;Therapeutic exercise    PT Goals (Current goals can be found in the Care Plan section)  Acute Rehab PT Goals Patient Stated Goal: to walk PT Goal Formulation: Patient unable to participate in goal setting Time For Goal Achievement: 10/13/19 Potential to Achieve Goals: Fair    Frequency  Min 2X/week   Barriers to discharge Decreased caregiver support      Co-evaluation               AM-PAC PT "6 Clicks" Mobility  Outcome Measure Help needed turning from your back to your side while in a flat bed without using bedrails?: A Lot Help needed moving from lying on your back to sitting on the side of a flat bed without using bedrails?: A Lot Help needed moving to and from a bed to a chair (including a wheelchair)?: Total Help needed standing up from a chair using your arms (e.g., wheelchair or bedside chair)?: Total Help needed to walk in hospital room?: Total Help needed climbing 3-5 steps with a railing? : Total 6 Click Score: 8    End of Session Equipment Utilized During Treatment: Oxygen Activity Tolerance: Patient limited by fatigue;Treatment limited secondary to medical complications (Comment)(increased resp. rate 40) Patient left: (on stretcher, )   PT Visit Diagnosis: Muscle weakness (generalized) (M62.81);Difficulty in walking, not elsewhere classified (R26.2)    Time: 5726-2035 PT Time Calculation (min) (ACUTE ONLY): 30 min   Charges:   PT Evaluation $PT Eval Low Complexity: 1 Low PT Treatments $Therapeutic Activity: 8-22 mins          Alanson Puls, PT DPT 09/29/2019, 3:45 PM

## 2019-09-30 LAB — BASIC METABOLIC PANEL
Anion gap: 7 (ref 5–15)
BUN: 60 mg/dL — ABNORMAL HIGH (ref 8–23)
CO2: 27 mmol/L (ref 22–32)
Calcium: 8.5 mg/dL — ABNORMAL LOW (ref 8.9–10.3)
Chloride: 108 mmol/L (ref 98–111)
Creatinine, Ser: 3.17 mg/dL — ABNORMAL HIGH (ref 0.44–1.00)
GFR calc Af Amer: 15 mL/min — ABNORMAL LOW (ref >60)
GFR calc non Af Amer: 13 mL/min — ABNORMAL LOW (ref >60)
Glucose, Bld: 90 mg/dL (ref 70–99)
Potassium: 4.4 mmol/L (ref 3.5–5.1)
Sodium: 142 mmol/L (ref 135–145)

## 2019-09-30 LAB — BRAIN NATRIURETIC PEPTIDE: B Natriuretic Peptide: 1048 pg/mL — ABNORMAL HIGH (ref 0.0–100.0)

## 2019-09-30 MED ORDER — HYDRALAZINE HCL 25 MG PO TABS
25.0000 mg | ORAL_TABLET | Freq: Three times a day (TID) | ORAL | Status: DC
  Administered 2019-09-30 – 2019-10-06 (×16): 25 mg via ORAL
  Filled 2019-09-30 (×16): qty 1

## 2019-09-30 NOTE — Progress Notes (Signed)
[facility]  ROUNDING NOTE   Subjective:  Patient well-known to [place] from prior admissions. Urine output 1.6 L over the preceding 24 hours. However patient has considerable renal dysfunction with an EGFR of 13. Discussions have been ongoing as an outpatient as to whether the patient would want to proceed with dialysis. We discussed this with the patient today at the bedside and she states that she would not want dialysis at the moment.   Objective:  Vital signs in last 24 hours:  Temp:  [97.5 F (36.4 C)-98.4 F (36.9 C)] 98.3 F (36.8 C) (04/18 1552) Pulse Rate:  [53-106] 63 (04/18 1552) Resp:  [16] 16 (04/18 1552) BP: (119-152)/(48-74) 140/71 (04/18 1552) SpO2:  [96 %-100 %] 100 % (04/18 1552) Weight:  [93.8 kg] 93.8 kg (04/18 0537)  Weight change: 13.9 kg Filed Weights   09/28/19 1501 09/29/19 1507 09/30/19 0537  Weight: 79.4 kg 93.3 kg 93.8 kg    Intake/Output: I/O last 3 completed shifts: In: 3 [I.V.:3] Out: 1650 [Urine:1650]   Intake/Output this shift:  No intake/output data recorded.  Physical Exam: General: No acute distress  Head: Normocephalic, atraumatic. Moist oral mucosal membranes  Eyes: Anicteric  Neck: Supple, trachea midline  Lungs:  Clear to auscultation, normal effort  Heart: S1S2 no rubs  Abdomen:  Soft, nontender, bowel sounds present  Extremities: 1+ peripheral edema.  Neurologic: Awake, alert, following commands  Skin: No lesions  Access: None    Basic Metabolic Panel: Recent Labs  Lab 09/28/19 1552 09/29/19 0614 09/30/19 0600  NA 143 144 142  K 4.3 4.3 4.4  CL 110 107 108  CO2 '24 28 27  ' GLUCOSE 113* 96 90  BUN 60* 59* 60*  CREATININE 3.09* 3.24* 3.17*  CALCIUM 8.2* 8.6* 8.5*    Liver Function Tests: No results for input(s): AST, ALT, ALKPHOS, BILITOT, PROT, ALBUMIN in the last 168 hours. No results for input(s): LIPASE, AMYLASE in the last 168 hours. No results for input(s): AMMONIA in the last 168  hours.  CBC: Recent Labs  Lab 09/28/19 1552  WBC 9.6  NEUTROABS 8.3*  HGB 8.5*  HCT 27.7*  MCV 97.2  PLT 191    Cardiac Enzymes: No results for input(s): CKTOTAL, CKMB, CKMBINDEX, TROPONINI in the last 168 hours.  BNP: Invalid input(s): POCBNP  CBG: No results for input(s): GLUCAP in the last 168 hours.  Microbiology: Results for orders placed or performed during the hospital encounter of 09/28/19  SARS CORONAVIRUS 2 (TAT 6-24 HRS) Nasopharyngeal Nasopharyngeal Swab     Status: None   Collection Time: 09/28/19  5:27 PM   Specimen: Nasopharyngeal Swab  Result Value Ref Range Status   SARS Coronavirus 2 NEGATIVE NEGATIVE Final    Comment: (NOTE) SARS-CoV-2 target nucleic acids are NOT DETECTED. The SARS-CoV-2 RNA is generally detectable in upper and lower respiratory specimens during the acute phase of infection. Negative results do not preclude SARS-CoV-2 infection, do not rule out co-infections with other pathogens, and should not be used as the sole basis for treatment or other patient management decisions. Negative results must be combined with clinical observations, patient history, and epidemiological information. The expected result is Negative. Fact Sheet for Patients: SugarRoll.be Fact Sheet for Healthcare Providers: https://www.woods-mathews.com/ This test is not yet approved or cleared by the [place] FDA and  has been authorized for detection and/or diagnosis of SARS-CoV-2 by FDA under an Emergency Use Authorization (EUA). This EUA will remain  in effect (meaning this test can be used)  for the duration of the COVID-19 declaration under Section 56 4(b)(1) of the Act, 21 U.S.C. section 360bbb-3(b)(1), unless the authorization is terminated or revoked sooner. Performed at [facility], [place] [address], [place], [place] [address]     Coagulation Studies: No results for input(s): LABPROT, INR in the last  72 hours.  Urinalysis: No results for input(s): COLORURINE, LABSPEC, PHURINE, GLUCOSEU, HGBUR, BILIRUBINUR, KETONESUR, PROTEINUR, UROBILINOGEN, NITRITE, LEUKOCYTESUR in the last 72 hours.  Invalid input(s): APPERANCEUR    Imaging: US Venous Img Lower Bilateral (DVT)  Result Date: 09/30/2019 CLINICAL DATA:  85 year old female with a history of swelling EXAM: BILATERAL LOWER EXTREMITY VENOUS DOPPLER ULTRASOUND TECHNIQUE: Gray-scale sonography with graded compression, as well as color Doppler and duplex ultrasound were performed to evaluate the lower extremity deep venous systems from the level of the common femoral vein and including the common femoral, femoral, profunda femoral, popliteal and calf veins including the posterior tibial, peroneal and gastrocnemius veins when visible. The superficial great saphenous vein was also interrogated. Spectral Doppler was utilized to evaluate flow at rest and with distal augmentation maneuvers in the common femoral, femoral and popliteal veins. COMPARISON:  None. FINDINGS: RIGHT LOWER EXTREMITY Common Femoral Vein: No evidence of thrombus. Normal compressibility, respiratory phasicity and response to augmentation. Saphenofemoral Junction: No evidence of thrombus. Normal compressibility and flow on color Doppler imaging. Profunda Femoral Vein: No evidence of thrombus. Normal compressibility and flow on color Doppler imaging. Femoral Vein: No evidence of thrombus. Normal compressibility, respiratory phasicity and response to augmentation. Popliteal Vein: No evidence of thrombus. Normal compressibility, respiratory phasicity and response to augmentation. Calf Veins: Calf veins not well visualized. Superficial Great Saphenous Vein: No evidence of thrombus. Normal compressibility and flow on color Doppler imaging. Other Findings:  Edema LEFT LOWER EXTREMITY Common Femoral Vein: No evidence of thrombus. Normal compressibility, respiratory phasicity and response to  augmentation. Saphenofemoral Junction: No evidence of thrombus. Normal compressibility and flow on color Doppler imaging. Profunda Femoral Vein: No evidence of thrombus. Normal compressibility and flow on color Doppler imaging. Femoral Vein: No evidence of thrombus. Normal compressibility, respiratory phasicity and response to augmentation. Popliteal Vein: No evidence of thrombus. Normal compressibility, respiratory phasicity and response to augmentation. Calf Veins: Calf veins not well visualized. Superficial Great Saphenous Vein: No evidence of thrombus. Normal compressibility and flow on color Doppler imaging. Other Findings:  Edema IMPRESSION: Sonographic survey of the bilateral lower extremities negative for DVT. Bilateral edema. Electronically Signed   By: Corrie Mckusick D.O.   On: 09/30/2019 09:40     Medications:   . sodium chloride     . acidophilus  2 capsule Oral TID  . aspirin EC  81 mg Oral Daily  . bumetanide (BUMEX) IV  1 mg Intravenous Q12H  . busPIRone  5 mg Oral Daily  . calcium-vitamin D  1 tablet Oral Daily  . cephALEXin  500 mg Oral Q8H  . docusate sodium  100 mg Oral BID  . ferrous gluconate  324 mg Oral Daily  . heparin  5,000 Units Subcutaneous Q8H  . hydrALAZINE  25 mg Oral Q8H  . metoprolol succinate  25 mg Oral Daily  . omega-3 acid ethyl esters  1,000 mg Oral Daily  . pantoprazole  40 mg Oral Daily  . polyethylene glycol  17 g Oral Daily  . sertraline  200 mg Oral Daily  . sodium chloride flush  3 mL Intravenous Q12H  . vitamin B-12  1,000 mcg Oral Daily   sodium  chloride, acetaminophen, ondansetron (ZOFRAN) IV, sodium chloride flush, traMADol  Assessment/ Plan:  84 y.o. female with past medical history of hypertension, chronic systolic heart failure, chronic kidney disease stage V baseline EGFR 13, asthma, GERD, hyperlipidemia, history of bifrontal subdural hemorrhages, dementia Admitted with acute on chronic systolic heart failure.  Ejection fraction 35 to  40%.  1.  Chronic kidney disease stage V.  The patient's baseline EGFR has been 13 recently.  She has been following with [facility] nephrology as an outpatient.  Patient has not yet made up her mind regarding renal placement therapy.  We had another discussion with the patient today as to whether she wanted to proceed with renal placement therapy however she declines.  Continue to monitor clinically for now.  Patient appears to understand that she is likely to not do well without dialysis.  2.  Acute on chronic systolic heart failure.  Patient on Bumex 1 mg IV every 12 hours.  She has had a reasonable diuretic response to this.  Okay to continue.  3.  Anemia of chronic kidney disease.  Hemoglobin currently 8.5.  She will likely need to continue Epogen as an outpatient.  4.  Secondary hyperparathyroidism.  Continue to monitor bone mineral metabolism parameters over the course of the hospitalization.  LOS: 2 Sriya Kroeze 4/18/20214:49 PM

## 2019-09-30 NOTE — NC FL2 (Deleted)
[place] LEVEL OF CARE SCREENING TOOL     IDENTIFICATION  Patient Name: Misty Berry Birthdate: [birth year] Sex: female Admission Date (Current Location): 09/28/2019  [place] and [place] Number:  Engineering geologist and Address:  [facility], [address] [place], [place] [address]      Provider Number: [id number]  Attending Physician Name and Address:  Nolberto Hanlon, MD  Relative Name and Phone Number:  Beckie Salts (Brother) [phone] [facility]    Current Level of Care: Hospital Recommended Level of Care: [place] Prior Approval Number:    Date Approved/Denied:   PASRR Number: [id number]  Discharge Plan: SNF    Current Diagnoses: Patient Active Problem List   Diagnosis Date Noted  . CHF (congestive heart failure) ([place]) 09/28/2019  . Lymphedema 09/10/2019  . Iron deficiency anemia 08/07/2019  . Anemia in stage 4 chronic kidney disease ([place]) 08/07/2019  . Acute respiratory failure with hypoxia ([place]) 07/02/2019  . Anemia of chronic kidney failure, stage 5 ([place]) 07/02/2019  . Dyspnea   . Hypoxemia 06/27/2019  . Acute on chronic systolic (congestive) heart failure ([place]) 06/27/2019  . Acid reflux 08/27/2015  . Cellulitis 08/27/2015  . Abnormal bruising 08/27/2015  . Arthritis, degenerative 08/27/2015  . OP (osteoporosis) 08/27/2015  . Presence of artificial eye 08/27/2015  . History of surgical procedure 08/27/2015  . Hypertensive heart disease with stage 4 chronic kidney disease ([place]) 04/30/2015  . Laceration of forearm without foreign body 04/30/2015  . Venous insufficiency of leg 02/28/2015  . Gluteal tendinitis 01/22/2015  . Degenerative arthritis of lumbar spine 12/12/2014  . Back muscle spasm 11/14/2014  . Degeneration of intervertebral disc of lumbar region 11/14/2014  . Neuritis or radiculitis due to rupture of lumbar intervertebral disc 11/14/2014  . Allergic rhinitis 08/31/2014  . Clinical  depression 02/19/2014  . Gait instability 02/19/2014  . Benign hypertensive heart and kidney disease 10/31/2013  . Left heart failure ([place]) 10/31/2013  . Branch retinal vein occlusion 09/11/2013  . Asthma 06/20/2013  . Essential hypertension 06/20/2013  . Follow-up examination 04/30/2013  . Acute renal failure ([place]) 02/16/2013  . Fall 02/16/2013  . Closed fracture of part of neck of femur ([place]) 02/16/2013  . Cellulitis and abscess of leg 10/30/2012  . Cellulitis of lower extremity 10/30/2012  . CKD (chronic kidney disease), stage V ([place]) 10/30/2012  . Central retinal edema, cystoid 01/31/2012  . Impaired vision 12/17/2011    Orientation RESPIRATION BLADDER Height & Weight     Self, Place  O2 External catheter Weight: 206 lb 11.2 oz (93.8 kg) Height:  5\' 6"  (167.6 cm)  BEHAVIORAL SYMPTOMS/MOOD NEUROLOGICAL BOWEL NUTRITION STATUS      Incontinent    AMBULATORY STATUS COMMUNICATION OF NEEDS Skin   Extensive Assist                           Personal Care Assistance Level of Assistance  Bathing, Feeding, Dressing, Total care Bathing Assistance: Maximum assistance Feeding assistance: Maximum assistance Dressing Assistance: Maximum assistance Total Care Assistance: Maximum assistance   Functional Limitations Info  Sight(Blind) Sight Info: Impaired(blind)        SPECIAL CARE FACTORS FREQUENCY  PT (By licensed PT), OT (By licensed OT)     PT Frequency: 5x weekly OT Frequency: 5x weekly            Contractures      Additional Factors Info  Code Status Code Status Info: Full  Current Medications (09/30/2019):  This is the current hospital active medication list Current Facility-Administered Medications  Medication Dose Route Frequency Provider Last Rate Last Admin  . 0.9 %  sodium chloride infusion  250 mL Intravenous PRN Nolberto Hanlon, MD      . acetaminophen (TYLENOL) tablet 650 mg  650 mg Oral Q4H PRN Nolberto Hanlon, MD      . acidophilus  (RISAQUAD) capsule 2 capsule  2 capsule Oral TID Nolberto Hanlon, MD   2 capsule at 09/30/19 1146  . aspirin EC tablet 81 mg  81 mg Oral Daily Nolberto Hanlon, MD   81 mg at 09/30/19 1144  . bumetanide (BUMEX) injection 1 mg  1 mg Intravenous Q12H Nolberto Hanlon, MD   1 mg at 09/30/19 1145  . busPIRone (BUSPAR) tablet 5 mg  5 mg Oral Daily Nolberto Hanlon, MD   5 mg at 09/30/19 1145  . calcium-vitamin D (OSCAL WITH D) 500-200 MG-UNIT per tablet 1 tablet  1 tablet Oral Daily Nolberto Hanlon, MD   1 tablet at 09/30/19 1144  . cephALEXin (KEFLEX) capsule 500 mg  500 mg Oral Q8H Nolberto Hanlon, MD   500 mg at 09/30/19 1147  . docusate sodium (COLACE) capsule 100 mg  100 mg Oral BID Nolberto Hanlon, MD   100 mg at 09/29/19 2009  . ferrous gluconate (FERGON) tablet 324 mg  324 mg Oral Daily Nolberto Hanlon, MD   324 mg at 09/30/19 1144  . heparin injection 5,000 Units  5,000 Units Subcutaneous Q8H Nolberto Hanlon, MD   5,000 Units at 09/30/19 1148  . hydrALAZINE (APRESOLINE) tablet 25 mg  25 mg Oral Q8H Amery, Sahar, MD      . metoprolol succinate (TOPROL-XL) 24 hr tablet 25 mg  25 mg Oral Daily Nolberto Hanlon, MD   25 mg at 09/30/19 1144  . omega-3 acid ethyl esters (LOVAZA) capsule 1,000 mg  1,000 mg Oral Daily Nolberto Hanlon, MD   1,000 mg at 09/30/19 1143  . ondansetron (ZOFRAN) injection 4 mg  4 mg Intravenous Q6H PRN Nolberto Hanlon, MD      . pantoprazole (PROTONIX) EC tablet 40 mg  40 mg Oral Daily Nolberto Hanlon, MD   40 mg at 09/30/19 1144  . polyethylene glycol (MIRALAX / GLYCOLAX) packet 17 g  17 g Oral Daily Nolberto Hanlon, MD      . sertraline (ZOLOFT) tablet 200 mg  200 mg Oral Daily Nolberto Hanlon, MD   200 mg at 09/30/19 1143  . sodium chloride flush (NS) 0.9 % injection 3 mL  3 mL Intravenous Q12H Nolberto Hanlon, MD   3 mL at 09/30/19 1145  . sodium chloride flush (NS) 0.9 % injection 3 mL  3 mL Intravenous PRN Nolberto Hanlon, MD      . traMADol Veatrice Bourbon) tablet 25 mg  25 mg Oral Q6H PRN Nolberto Hanlon, MD      . vitamin B-12  (CYANOCOBALAMIN) tablet 1,000 mcg  1,000 mcg Oral Daily Nolberto Hanlon, MD   1,000 mcg at 09/30/19 1143     Discharge Medications: Please see discharge summary for a list of discharge medications.  Relevant Imaging Results:  Relevant Lab Results:   Additional Information SSN:[id number]  Meriel Flavors, LCSW

## 2019-09-30 NOTE — NC FL2 (Signed)
[place] LEVEL OF CARE SCREENING TOOL     IDENTIFICATION  Patient Name: Misty Berry Birthdate: [birth year] Sex: female Admission Date (Current Location): 09/28/2019  [place] and [place] Number:  Engineering geologist and Address:  [facility], [address] [place], [place] [address]      Provider Number: [id number]  Attending Physician Name and Address:  Nolberto Hanlon, MD  Relative Name and Phone Number:  Beckie Salts (Brother) [phone] [facility])    Current Level of Care: Hospital Recommended Level of Care: [place] Prior Approval Number:    Date Approved/Denied:   PASRR Number: [id number]  Discharge Plan: SNF    Current Diagnoses: Patient Active Problem List   Diagnosis Date Noted  . CHF (congestive heart failure) ([place]) 09/28/2019  . Lymphedema 09/10/2019  . Iron deficiency anemia 08/07/2019  . Anemia in stage 4 chronic kidney disease ([place]) 08/07/2019  . Acute respiratory failure with hypoxia ([place]) 07/02/2019  . Anemia of chronic kidney failure, stage 5 ([place]) 07/02/2019  . Dyspnea   . Hypoxemia 06/27/2019  . Acute on chronic systolic (congestive) heart failure ([place]) 06/27/2019  . Acid reflux 08/27/2015  . Cellulitis 08/27/2015  . Abnormal bruising 08/27/2015  . Arthritis, degenerative 08/27/2015  . OP (osteoporosis) 08/27/2015  . Presence of artificial eye 08/27/2015  . History of surgical procedure 08/27/2015  . Hypertensive heart disease with stage 4 chronic kidney disease ([place]) 04/30/2015  . Laceration of forearm without foreign body 04/30/2015  . Venous insufficiency of leg 02/28/2015  . Gluteal tendinitis 01/22/2015  . Degenerative arthritis of lumbar spine 12/12/2014  . Back muscle spasm 11/14/2014  . Degeneration of intervertebral disc of lumbar region 11/14/2014  . Neuritis or radiculitis due to rupture of lumbar intervertebral disc 11/14/2014  . Allergic rhinitis 08/31/2014  . Clinical  depression 02/19/2014  . Gait instability 02/19/2014  . Benign hypertensive heart and kidney disease 10/31/2013  . Left heart failure ([place]) 10/31/2013  . Branch retinal vein occlusion 09/11/2013  . Asthma 06/20/2013  . Essential hypertension 06/20/2013  . Follow-up examination 04/30/2013  . Acute renal failure ([place]) 02/16/2013  . Fall 02/16/2013  . Closed fracture of part of neck of femur ([place]) 02/16/2013  . Cellulitis and abscess of leg 10/30/2012  . Cellulitis of lower extremity 10/30/2012  . CKD (chronic kidney disease), stage V ([place]) 10/30/2012  . Central retinal edema, cystoid 01/31/2012  . Impaired vision 12/17/2011    Orientation RESPIRATION BLADDER Height & Weight     Self, Place  O2 External catheter Weight: 206 lb 11.2 oz (93.8 kg) Height:  5\' 6"  (167.6 cm)  BEHAVIORAL SYMPTOMS/MOOD NEUROLOGICAL BOWEL NUTRITION STATUS      Incontinent    AMBULATORY STATUS COMMUNICATION OF NEEDS Skin   Extensive Assist                           Personal Care Assistance Level of Assistance  Bathing, Feeding, Dressing, Total care Bathing Assistance: Maximum assistance Feeding assistance: Maximum assistance Dressing Assistance: Maximum assistance Total Care Assistance: Maximum assistance   Functional Limitations Info  Sight(Blind) Sight Info: Impaired(blind)        SPECIAL CARE FACTORS FREQUENCY  PT (By licensed PT), OT (By licensed OT)     PT Frequency: 5x weekly OT Frequency: 5x weekly            Contractures      Additional Factors Info  Code Status Code Status Info: Full  Current Medications (09/30/2019):  This is the current hospital active medication list Current Facility-Administered Medications  Medication Dose Route Frequency Provider Last Rate Last Admin  . 0.9 %  sodium chloride infusion  250 mL Intravenous PRN Nolberto Hanlon, MD      . acetaminophen (TYLENOL) tablet 650 mg  650 mg Oral Q4H PRN Nolberto Hanlon, MD      . acidophilus  (RISAQUAD) capsule 2 capsule  2 capsule Oral TID Nolberto Hanlon, MD   2 capsule at 09/30/19 1146  . aspirin EC tablet 81 mg  81 mg Oral Daily Nolberto Hanlon, MD   81 mg at 09/30/19 1144  . bumetanide (BUMEX) injection 1 mg  1 mg Intravenous Q12H Nolberto Hanlon, MD   1 mg at 09/30/19 1145  . busPIRone (BUSPAR) tablet 5 mg  5 mg Oral Daily Nolberto Hanlon, MD   5 mg at 09/30/19 1145  . calcium-vitamin D (OSCAL WITH D) 500-200 MG-UNIT per tablet 1 tablet  1 tablet Oral Daily Nolberto Hanlon, MD   1 tablet at 09/30/19 1144  . cephALEXin (KEFLEX) capsule 500 mg  500 mg Oral Q8H Nolberto Hanlon, MD   500 mg at 09/30/19 1147  . docusate sodium (COLACE) capsule 100 mg  100 mg Oral BID Nolberto Hanlon, MD   100 mg at 09/29/19 2009  . ferrous gluconate (FERGON) tablet 324 mg  324 mg Oral Daily Nolberto Hanlon, MD   324 mg at 09/30/19 1144  . heparin injection 5,000 Units  5,000 Units Subcutaneous Q8H Nolberto Hanlon, MD   5,000 Units at 09/30/19 1148  . hydrALAZINE (APRESOLINE) tablet 25 mg  25 mg Oral Q8H Amery, Sahar, MD      . metoprolol succinate (TOPROL-XL) 24 hr tablet 25 mg  25 mg Oral Daily Nolberto Hanlon, MD   25 mg at 09/30/19 1144  . omega-3 acid ethyl esters (LOVAZA) capsule 1,000 mg  1,000 mg Oral Daily Nolberto Hanlon, MD   1,000 mg at 09/30/19 1143  . ondansetron (ZOFRAN) injection 4 mg  4 mg Intravenous Q6H PRN Nolberto Hanlon, MD      . pantoprazole (PROTONIX) EC tablet 40 mg  40 mg Oral Daily Nolberto Hanlon, MD   40 mg at 09/30/19 1144  . polyethylene glycol (MIRALAX / GLYCOLAX) packet 17 g  17 g Oral Daily Nolberto Hanlon, MD      . sertraline (ZOLOFT) tablet 200 mg  200 mg Oral Daily Nolberto Hanlon, MD   200 mg at 09/30/19 1143  . sodium chloride flush (NS) 0.9 % injection 3 mL  3 mL Intravenous Q12H Nolberto Hanlon, MD   3 mL at 09/30/19 1145  . sodium chloride flush (NS) 0.9 % injection 3 mL  3 mL Intravenous PRN Nolberto Hanlon, MD      . traMADol Veatrice Bourbon) tablet 25 mg  25 mg Oral Q6H PRN Nolberto Hanlon, MD      . vitamin B-12  (CYANOCOBALAMIN) tablet 1,000 mcg  1,000 mcg Oral Daily Nolberto Hanlon, MD   1,000 mcg at 09/30/19 1143     Discharge Medications: Please see discharge summary for a list of discharge medications.  Relevant Imaging Results:  Relevant Lab Results:   Additional Information SSN:[id number]  Meriel Flavors, LCSW

## 2019-09-30 NOTE — Progress Notes (Signed)
PROGRESS NOTE    Misty Berry  SJG:[id number] DOB: 28-Apr-1935 DOA: 09/28/2019 PCP: Nelia Shi, MD    Brief Narrative:  Misty Berry is a 85 y.o. female with medical history significant of chronic kidney disease stage IV, anemia of chronic kidney disease, left eye blindness osteoarthritis, GERD, chronic systolic heart failure with echo on January 2021 revealing EF of 35 to 40% presents with complaints of dyspnea on exertion that started yesterday.  She is unable to tell me how much she can walk before she becomes short of breath because she has not been paying attention.  Denies orthopnea.  Denies chest pain or chest pressure.  She does not remember what she ate yesterday or whether she had a salty food.  Denies any fever, chills, dizziness, or lightheadedness.    Consultants:     Procedures:  Antimicrobials:      Subjective: Feels a little better. Still with sob, not at baseline, but definetly improved.   Objective: Vitals:   09/30/19 0537 09/30/19 0746 09/30/19 1140 09/30/19 1204  BP:  (!) 152/59  (!) 152/74  Pulse:  (!) 55 62 (!) 53  Resp:  16  16  Temp: (!) 97.5 F (36.4 C) 98.4 F (36.9 C)  98.3 F (36.8 C)  TempSrc: Axillary     SpO2:  100%  96%  Weight: 93.8 kg     Height:        Intake/Output Summary (Last 24 hours) at 09/30/2019 1312 Last data filed at 09/30/2019 0521 Gross per 24 hour  Intake --  Output 650 ml  Net -650 ml   Filed Weights   09/28/19 1501 09/29/19 1507 09/30/19 0537  Weight: 79.4 kg 93.3 kg 93.8 kg    Examination:  General exam: Appears calm and comfortable lying in bed almost flat  Respiratory system: Scattered crackles at bases, no wheezing  cardiovascular system: S1 & S2 heard, RRR. No JVD, murmurs, rubs, gallops or clicks Gastrointestinal system: Abdomen is nondistended, soft and nontender.Normal bowel sounds heard. Central nervous system: Alert and oriented.   Extremities: Positive edema,less edema Skin:  Warm dry Psychiatry: Judgement and insight appear normal. Mood & affect appropriate.     Data Reviewed: I have personally reviewed following labs and imaging studies  CBC: Recent Labs  Lab 09/28/19 1552  WBC 9.6  NEUTROABS 8.3*  HGB 8.5*  HCT 27.7*  MCV 97.2  PLT 654   Basic Metabolic Panel: Recent Labs  Lab 09/28/19 1552 09/29/19 0614 09/30/19 0600  NA 143 144 142  K 4.3 4.3 4.4  CL 110 107 108  CO2 24 28 27   GLUCOSE 113* 96 90  BUN 60* 59* 60*  CREATININE 3.09* 3.24* 3.17*  CALCIUM 8.2* 8.6* 8.5*   GFR: Estimated Creatinine Clearance: 15.8 mL/min (A) (by C-G formula based on SCr of 3.17 mg/dL (H)). Liver Function Tests: No results for input(s): AST, ALT, ALKPHOS, BILITOT, PROT, ALBUMIN in the last 168 hours. No results for input(s): LIPASE, AMYLASE in the last 168 hours. No results for input(s): AMMONIA in the last 168 hours. Coagulation Profile: No results for input(s): INR, PROTIME in the last 168 hours. Cardiac Enzymes: No results for input(s): CKTOTAL, CKMB, CKMBINDEX, TROPONINI in the last 168 hours. BNP (last 3 results) No results for input(s): PROBNP in the last 8760 hours. HbA1C: No results for input(s): HGBA1C in the last 72 hours. CBG: No results for input(s): GLUCAP in the last 168 hours. Lipid Profile: No results for input(s): CHOL, HDL, LDLCALC,  TRIG, CHOLHDL, LDLDIRECT in the last 72 hours. Thyroid Function Tests: No results for input(s): TSH, T4TOTAL, FREET4, T3FREE, THYROIDAB in the last 72 hours. Anemia Panel: No results for input(s): VITAMINB12, FOLATE, FERRITIN, TIBC, IRON, RETICCTPCT in the last 72 hours. Sepsis Labs: No results for input(s): PROCALCITON, LATICACIDVEN in the last 168 hours.  Recent Results (from the past 240 hour(s))  SARS CORONAVIRUS 2 (TAT 6-24 HRS) Nasopharyngeal Nasopharyngeal Swab     Status: None   Collection Time: 09/28/19  5:27 PM   Specimen: Nasopharyngeal Swab  Result Value Ref Range Status   SARS  Coronavirus 2 NEGATIVE NEGATIVE Final    Comment: (NOTE) SARS-CoV-2 target nucleic acids are NOT DETECTED. The SARS-CoV-2 RNA is generally detectable in upper and lower respiratory specimens during the acute phase of infection. Negative results do not preclude SARS-CoV-2 infection, do not rule out co-infections with other pathogens, and should not be used as the sole basis for treatment or other patient management decisions. Negative results must be combined with clinical observations, patient history, and epidemiological information. The expected result is Negative. Fact Sheet for Patients: SugarRoll.be Fact Sheet for Healthcare Providers: https://www.woods-mathews.com/ This test is not yet approved or cleared by the [place] FDA and  has been authorized for detection and/or diagnosis of SARS-CoV-2 by FDA under an Emergency Use Authorization (EUA). This EUA will remain  in effect (meaning this test can be used) for the duration of the COVID-19 declaration under Section 56 4(b)(1) of the Act, 21 U.S.C. section 360bbb-3(b)(1), unless the authorization is terminated or revoked sooner. Performed at [facility], [place] [address], [place], [place] [address]          Radiology Studies: DG Chest 2 View  Result Date: 09/28/2019 CLINICAL DATA:  Shortness of breath EXAM: CHEST - 2 VIEW COMPARISON:  06/29/2019 FINDINGS: Cardiac shadow is enlarged but stable. Aortic calcifications are again seen. Small bilateral pleural effusions are noted. Mild bibasilar atelectasis is seen stable from the prior exam. Old rib fractures are seen bilaterally. IMPRESSION: Mild bibasilar atelectasis. Electronically Signed   By: Inez Catalina M.D.   On: 09/28/2019 15:50   US Venous Img Lower Bilateral (DVT)  Result Date: 09/30/2019 CLINICAL DATA:  85 year old female with a history of swelling EXAM: BILATERAL LOWER EXTREMITY VENOUS DOPPLER ULTRASOUND TECHNIQUE:  Gray-scale sonography with graded compression, as well as color Doppler and duplex ultrasound were performed to evaluate the lower extremity deep venous systems from the level of the common femoral vein and including the common femoral, femoral, profunda femoral, popliteal and calf veins including the posterior tibial, peroneal and gastrocnemius veins when visible. The superficial great saphenous vein was also interrogated. Spectral Doppler was utilized to evaluate flow at rest and with distal augmentation maneuvers in the common femoral, femoral and popliteal veins. COMPARISON:  None. FINDINGS: RIGHT LOWER EXTREMITY Common Femoral Vein: No evidence of thrombus. Normal compressibility, respiratory phasicity and response to augmentation. Saphenofemoral Junction: No evidence of thrombus. Normal compressibility and flow on color Doppler imaging. Profunda Femoral Vein: No evidence of thrombus. Normal compressibility and flow on color Doppler imaging. Femoral Vein: No evidence of thrombus. Normal compressibility, respiratory phasicity and response to augmentation. Popliteal Vein: No evidence of thrombus. Normal compressibility, respiratory phasicity and response to augmentation. Calf Veins: Calf veins not well visualized. Superficial Great Saphenous Vein: No evidence of thrombus. Normal compressibility and flow on color Doppler imaging. Other Findings:  Edema LEFT LOWER EXTREMITY Common Femoral Vein: No evidence of thrombus. Normal compressibility, respiratory phasicity  and response to augmentation. Saphenofemoral Junction: No evidence of thrombus. Normal compressibility and flow on color Doppler imaging. Profunda Femoral Vein: No evidence of thrombus. Normal compressibility and flow on color Doppler imaging. Femoral Vein: No evidence of thrombus. Normal compressibility, respiratory phasicity and response to augmentation. Popliteal Vein: No evidence of thrombus. Normal compressibility, respiratory phasicity and response  to augmentation. Calf Veins: Calf veins not well visualized. Superficial Great Saphenous Vein: No evidence of thrombus. Normal compressibility and flow on color Doppler imaging. Other Findings:  Edema IMPRESSION: Sonographic survey of the bilateral lower extremities negative for DVT. Bilateral edema. Electronically Signed   By: Corrie Mckusick D.O.   On: 09/30/2019 09:40        Scheduled Meds: . acidophilus  2 capsule Oral TID  . aspirin EC  81 mg Oral Daily  . bumetanide (BUMEX) IV  1 mg Intravenous Q12H  . busPIRone  5 mg Oral Daily  . calcium-vitamin D  1 tablet Oral Daily  . cephALEXin  500 mg Oral Q8H  . docusate sodium  100 mg Oral BID  . ferrous gluconate  324 mg Oral Daily  . heparin  5,000 Units Subcutaneous Q8H  . hydrALAZINE  10 mg Oral Q8H  . metoprolol succinate  25 mg Oral Daily  . omega-3 acid ethyl esters  1,000 mg Oral Daily  . pantoprazole  40 mg Oral Daily  . polyethylene glycol  17 g Oral Daily  . sertraline  200 mg Oral Daily  . sodium chloride flush  3 mL Intravenous Q12H  . vitamin B-12  1,000 mcg Oral Daily   Continuous Infusions: . sodium chloride      Assessment & Plan:   Active Problems:   CHF (congestive heart failure) ([place])   1.  Acute on chronic systolic heart failure-EF of 35 to 40% on echo from 1/21 Clinically improving, still volume overloaded, BNP decreasing continue on Bumex 1 mg IV twice daily  Monitor electrolytes and kidney function Venous Doppler negative for DVT bilaterally Needs blood pressure control We will continue her beta-blocker, hydralazine IV as needed Strict I's and O's Daily weight   2.  Chronic kidney disease stage IV-creatinine appears to be at baseline of 3.0's Monitor while diuresing Will consult nephrology as patient was discussing her status about dialysis as outpatient and will switch to follow-up with vascular surgery  3.  Essential hypertension-continue home beta-blocker May need to change to Coreg once  more euvolemic for better blood pressure control if heart rate tolerates Increase hydralazine to 25 mg 3 times daily, increase as tolerated  4.  GERD-continue her PPI  5.  Anemia of chronic kidney disease-currently H&H at baseline  6.  Depression continue outpatient medications  DVT prophylaxis: Heparin subcu Code Status: Full code Family Communication: None at bedside Disposition Plan: Back to previous [place] life barrier: Needs IV diuretics as she is still very much volume overloaded      LOS: 2 days   Time spent: 45 minutes with more than 50% COC    Nolberto Hanlon, MD [facility] Pager [phone]  If 7PM-7AM, please contact night-coverage [email or web address] Password [email or web address] 09/30/2019, 1:12 PM Patient ID: Misty Berry, female   DOB: February 23, 1935, 85 y.o.   MRN: [id number]

## 2019-10-01 ENCOUNTER — Other Ambulatory Visit: Payer: Self-pay

## 2019-10-01 ENCOUNTER — Other Ambulatory Visit (INDEPENDENT_AMBULATORY_CARE_PROVIDER_SITE_OTHER): Payer: Self-pay | Admitting: Vascular Surgery

## 2019-10-01 DIAGNOSIS — R609 Edema, unspecified: Secondary | ICD-10-CM

## 2019-10-01 DIAGNOSIS — R0602 Shortness of breath: Secondary | ICD-10-CM

## 2019-10-01 DIAGNOSIS — R0600 Dyspnea, unspecified: Secondary | ICD-10-CM

## 2019-10-01 DIAGNOSIS — I5033 Acute on chronic diastolic (congestive) heart failure: Secondary | ICD-10-CM

## 2019-10-01 LAB — BASIC METABOLIC PANEL
Anion gap: 7 (ref 5–15)
BUN: 66 mg/dL — ABNORMAL HIGH (ref 8–23)
CO2: 27 mmol/L (ref 22–32)
Calcium: 8.5 mg/dL — ABNORMAL LOW (ref 8.9–10.3)
Chloride: 108 mmol/L (ref 98–111)
Creatinine, Ser: 3.39 mg/dL — ABNORMAL HIGH (ref 0.44–1.00)
GFR calc Af Amer: 14 mL/min — ABNORMAL LOW (ref >60)
GFR calc non Af Amer: 12 mL/min — ABNORMAL LOW (ref >60)
Glucose, Bld: 92 mg/dL (ref 70–99)
Potassium: 4.3 mmol/L (ref 3.5–5.1)
Sodium: 142 mmol/L (ref 135–145)

## 2019-10-01 LAB — BRAIN NATRIURETIC PEPTIDE: B Natriuretic Peptide: 690 pg/mL — ABNORMAL HIGH (ref 0.0–100.0)

## 2019-10-01 MED ORDER — CEPHALEXIN 500 MG PO CAPS
500.0000 mg | ORAL_CAPSULE | Freq: Two times a day (BID) | ORAL | Status: DC
  Administered 2019-10-01 – 2019-10-04 (×6): 500 mg via ORAL
  Filled 2019-10-01 (×6): qty 1

## 2019-10-01 MED ORDER — CHLORHEXIDINE GLUCONATE CLOTH 2 % EX PADS
6.0000 | MEDICATED_PAD | Freq: Every day | CUTANEOUS | Status: DC
  Administered 2019-10-02 – 2019-10-06 (×4): 6 via TOPICAL

## 2019-10-01 MED ORDER — BUMETANIDE 1 MG PO TABS
1.0000 mg | ORAL_TABLET | Freq: Every day | ORAL | Status: DC
  Administered 2019-10-01 – 2019-10-06 (×6): 1 mg via ORAL
  Filled 2019-10-01 (×7): qty 1

## 2019-10-01 NOTE — Progress Notes (Signed)
Pt disoriented to time, place and situation overnight. Pt reoriented easily. IV diuresis continued. Purewick in place. Sacral wound stage 2 in place. Importance of turning and pressure relief education provided to pt. Pt communicates understanding. Needs reinforcement.

## 2019-10-01 NOTE — Progress Notes (Signed)
Spoke with brother Misty Berry who is patient's HPOA. Updated on patient's status and plan of care. Per [name]'s request, asked to look thru patient's chart to see if we have a copy of the legal document. We do not so I asked if he could fax it and he mentioned it would be really hard for him to do that, if there was another way he could send it. He could email it, medical records in now closed so I advised that we would have to follow up on it tomorrow. He asked for the fax number anyway incase he is able to fax it tomorrow. His name and contact info is in the chart.

## 2019-10-01 NOTE — Consult Note (Signed)
[facility] Vascular Consult Note  MRN : [id number]  Misty Berry is a 85 y.o. (1934/10/24) female who presents with chief complaint of  Chief Complaint  Patient presents with  . Shortness of Breath   History of Present Illness:  The patient is an 85 year old female with multiple medical issues including chronic kidney disease who presented to the [facility] emergency department progressively worsening shortness of breath.  Patient endorses history of progressively worsening shortness of breath worsened with exertion.  Patient has been treated in outpatient setting with an foods for progressively worsening bilateral lower extremity edema.  Patient with known history of chronic kidney disease which has also progressed.  Seen by nephrology who would like to initiate dialysis at this time.  Denies any fever, nausea vomiting.  Denies chest pain.  Slight improvement in shortness of breath.  Vascular surgery was consulted by Dr. Holley Raring for PermCath insertion.  Current Facility-Administered Medications  Medication Dose Route Frequency Provider Last Rate Last Admin  . 0.9 %  sodium chloride infusion  250 mL Intravenous PRN Nolberto Hanlon, MD      . acetaminophen (TYLENOL) tablet 650 mg  650 mg Oral Q4H PRN Nolberto Hanlon, MD   650 mg at 09/30/19 2120  . acidophilus (RISAQUAD) capsule 2 capsule  2 capsule Oral TID Nolberto Hanlon, MD   2 capsule at 10/01/19 1410  . aspirin EC tablet 81 mg  81 mg Oral Daily Nolberto Hanlon, MD   81 mg at 10/01/19 0848  . bumetanide (BUMEX) tablet 1 mg  1 mg Oral Daily Nolberto Hanlon, MD   1 mg at 10/01/19 0945  . busPIRone (BUSPAR) tablet 5 mg  5 mg Oral Daily Nolberto Hanlon, MD   5 mg at 10/01/19 0850  . calcium-vitamin D (OSCAL WITH D) 500-200 MG-UNIT per tablet 1 tablet  1 tablet Oral Daily Nolberto Hanlon, MD   1 tablet at 10/01/19 0849  . cephALEXin (KEFLEX) capsule 500 mg  500 mg Oral Q12H Rowland Lathe, RPH      . docusate  sodium (COLACE) capsule 100 mg  100 mg Oral BID Nolberto Hanlon, MD   100 mg at 09/29/19 2009  . ferrous gluconate (FERGON) tablet 324 mg  324 mg Oral Daily Nolberto Hanlon, MD   324 mg at 10/01/19 0850  . heparin injection 5,000 Units  5,000 Units Subcutaneous Q8H Nolberto Hanlon, MD   5,000 Units at 10/01/19 1410  . hydrALAZINE (APRESOLINE) tablet 25 mg  25 mg Oral Q8H Nolberto Hanlon, MD   25 mg at 10/01/19 1410  . metoprolol succinate (TOPROL-XL) 24 hr tablet 25 mg  25 mg Oral Daily Nolberto Hanlon, MD   25 mg at 10/01/19 0848  . omega-3 acid ethyl esters (LOVAZA) capsule 1,000 mg  1,000 mg Oral Daily Nolberto Hanlon, MD   1,000 mg at 10/01/19 0848  . ondansetron (ZOFRAN) injection 4 mg  4 mg Intravenous Q6H PRN Nolberto Hanlon, MD      . pantoprazole (PROTONIX) EC tablet 40 mg  40 mg Oral Daily Nolberto Hanlon, MD   40 mg at 10/01/19 0849  . polyethylene glycol (MIRALAX / GLYCOLAX) packet 17 g  17 g Oral Daily Nolberto Hanlon, MD      . sertraline (ZOLOFT) tablet 200 mg  200 mg Oral Daily Nolberto Hanlon, MD   200 mg at 10/01/19 0848  . sodium chloride flush (NS) 0.9 % injection 3 mL  3 mL Intravenous Q12H Nolberto Hanlon, MD  3 mL at 10/01/19 0851  . sodium chloride flush (NS) 0.9 % injection 3 mL  3 mL Intravenous PRN Nolberto Hanlon, MD      . traMADol Veatrice Bourbon) tablet 25 mg  25 mg Oral Q6H PRN Nolberto Hanlon, MD   25 mg at 10/01/19 0021  . vitamin B-12 (CYANOCOBALAMIN) tablet 1,000 mcg  1,000 mcg Oral Daily Nolberto Hanlon, MD   1,000 mcg at 10/01/19 5462   Past Medical History:  Diagnosis Date  . Asthma   . Bruises easily   . Cellulitis   . Cough   . DDD (degenerative disc disease), lumbar   . GERD (gastroesophageal reflux disease)   . HBP (high blood pressure)   . Hypercholesterolemia   . Hyperparathyroidism ([place])   . Osteoarthritis   . Osteoporosis   . Rash   . Renal disorder    kidney disease  . Swelling    Past Surgical History:  Procedure Laterality Date  . APPENDECTOMY    . BREAST REDUCTION SURGERY    .  CHOLECYSTECTOMY    . EYE SURGERY Left   . FEMUR SURGERY    . FOOT SURGERY Right   . GALLBLADDER SURGERY    . LIPECTOMY    . PAROTID GLAND TUMOR EXCISION    . THYROID SURGERY    . TONSILLECTOMY    . TUBAL LIGATION    . VITRECTOMY     Social History Social History   Tobacco Use  . Smoking status: Never Smoker  . Smokeless tobacco: Never Used  Substance Use Topics  . Alcohol use: No  . Drug use: No   Family History History reviewed. No pertinent family history.  Allergies  Allergen Reactions  . Ivp Dye [Iodinated Diagnostic Agents] Other (See Comments)  . Lactose   . Methylprednisolone Other (See Comments)    Reaction: Hallucinations and Psychosis  . Peanuts [Peanut Oil] Other (See Comments)    Swelling and vomitting  . Vancomycin Other (See Comments)    Reaction: unknown  . Wellbutrin [Bupropion] Other (See Comments)    Hallucinations   REVIEW OF SYSTEMS (Negative unless checked)  Constitutional: [] Weight loss  [] Fever  [] Chills Cardiac: [] Chest pain   [] Chest pressure   [] Palpitations   [x] Shortness of breath when laying flat   [x] Shortness of breath at rest   [x] Shortness of breath with exertion. Vascular:  [] Pain in legs with walking   [] Pain in legs at rest   [] Pain in legs when laying flat   [] Claudication   [] Pain in feet when walking  [] Pain in feet at rest  [] Pain in feet when laying flat   [] History of DVT   [] Phlebitis   [x] Swelling in legs   [] Varicose veins   [] Non-healing ulcers Pulmonary:   [] Uses home oxygen   [] Productive cough   [] Hemoptysis   [] Wheeze  [] COPD   [] Asthma Neurologic:  [] Dizziness  [] Blackouts   [] Seizures   [] History of stroke   [] History of TIA  [] Aphasia   [] Temporary blindness   [] Dysphagia   [] Weakness or numbness in arms   [] Weakness or numbness in legs Musculoskeletal:  [] Arthritis   [] Joint swelling   [] Joint pain   [] Low back pain Hematologic:  [] Easy bruising  [] Easy bleeding   [] Hypercoagulable state   [] Anemic   [] Hepatitis Gastrointestinal:  [] Blood in stool   [] Vomiting blood  [] Gastroesophageal reflux/heartburn   [] Difficulty swallowing. Genitourinary:  [] Chronic kidney disease   [] Difficult urination  [] Frequent urination  [] Burning with urination   [] Blood in  urine Skin:  [] Rashes   [] Ulcers   [] Wounds Psychological:  [] History of anxiety   []  History of major depression.  Physical Examination  Vitals:   10/01/19 0350 10/01/19 0746 10/01/19 1156 10/01/19 1409  BP: (!) 143/59 (!) 136/58 (!) 144/54 (!) 159/67  Pulse: 62 63 66 68  Resp:  17 18   Temp: 98.4 F (36.9 C) 98.4 F (36.9 C) 98.4 F (36.9 C)   TempSrc: Oral Oral Oral   SpO2: 90% 92% 92%   Weight: 92.5 kg     Height:       Body mass index is 32.91 kg/m. Gen:  WD/WN, NAD Head: [place]/AT, No temporalis wasting. Prominent temp pulse not noted. Ear/Nose/Throat: Hearing grossly intact, nares w/o erythema or drainage, oropharynx w/o Erythema/Exudate Eyes: Sclera non-icteric, conjunctiva clear Neck: Trachea midline.  No JVD.  Pulmonary:  Good air movement, respirations not labored, equal bilaterally.  Cardiac: RRR, normal S1, S2. Vascular:  Vessel Right Left  Radial Palpable Palpable  Ulnar Palpable Palpable  Brachial Palpable Palpable  Carotid Palpable, without bruit Palpable, without bruit  Aorta Not palpable N/A  Femoral Palpable Palpable  Popliteal Palpable Palpable  PT Palpable Palpable  DP Palpable Palpable   Gastrointestinal: soft, non-tender/non-distended. No guarding/reflex.  Musculoskeletal: M/S 5/5 throughout.  Extremities without ischemic changes.  No deformity or atrophy. No edema. Neurologic: Sensation grossly intact in extremities.  Symmetrical.  Speech is fluent. Motor exam as listed above. Psychiatric: Judgment intact, Mood & affect appropriate for pt's clinical situation. Dermatologic: No rashes or ulcers noted.  No cellulitis or open wounds. Lymph : No Cervical, Axillary, or Inguinal  lymphadenopathy.  CBC Lab Results  Component Value Date   WBC 9.6 09/28/2019   HGB 8.5 (L) 09/28/2019   HCT 27.7 (L) 09/28/2019   MCV 97.2 09/28/2019   PLT 191 09/28/2019   BMET    Component Value Date/Time   NA 142 10/01/2019 0450   NA 141 04/23/2013 0534   K 4.3 10/01/2019 0450   K 3.9 04/23/2013 0534   CL 108 10/01/2019 0450   CL 112 (H) 04/23/2013 0534   CO2 27 10/01/2019 0450   CO2 23 04/23/2013 0534   GLUCOSE 92 10/01/2019 0450   GLUCOSE 92 04/23/2013 0534   BUN 66 (H) 10/01/2019 0450   BUN 31 (H) 04/23/2013 0534   CREATININE 3.39 (H) 10/01/2019 0450   CREATININE 1.78 (H) 04/23/2013 0534   CALCIUM 8.5 (L) 10/01/2019 0450   CALCIUM 8.6 04/23/2013 0534   GFRNONAA 12 (L) 10/01/2019 0450   GFRNONAA 27 (L) 04/23/2013 0534   GFRAA 14 (L) 10/01/2019 0450   GFRAA 32 (L) 04/23/2013 0534   Estimated Creatinine Clearance: 14.7 mL/min (A) (by C-G formula based on SCr of 3.39 mg/dL (H)).  COAG Lab Results  Component Value Date   INR 1.0 02/04/2013   Radiology DG Chest 2 View  Result Date: 09/28/2019 CLINICAL DATA:  Shortness of breath EXAM: CHEST - 2 VIEW COMPARISON:  06/29/2019 FINDINGS: Cardiac shadow is enlarged but stable. Aortic calcifications are again seen. Small bilateral pleural effusions are noted. Mild bibasilar atelectasis is seen stable from the prior exam. Old rib fractures are seen bilaterally. IMPRESSION: Mild bibasilar atelectasis. Electronically Signed   By: Inez Catalina M.D.   On: 09/28/2019 15:50   US Venous Img Lower Bilateral (DVT)  Result Date: 09/30/2019 CLINICAL DATA:  85 year old female with a history of swelling EXAM: BILATERAL LOWER EXTREMITY VENOUS DOPPLER ULTRASOUND TECHNIQUE: Gray-scale sonography with graded compression, as well as  color Doppler and duplex ultrasound were performed to evaluate the lower extremity deep venous systems from the level of the common femoral vein and including the common femoral, femoral, profunda femoral, popliteal  and calf veins including the posterior tibial, peroneal and gastrocnemius veins when visible. The superficial great saphenous vein was also interrogated. Spectral Doppler was utilized to evaluate flow at rest and with distal augmentation maneuvers in the common femoral, femoral and popliteal veins. COMPARISON:  None. FINDINGS: RIGHT LOWER EXTREMITY Common Femoral Vein: No evidence of thrombus. Normal compressibility, respiratory phasicity and response to augmentation. Saphenofemoral Junction: No evidence of thrombus. Normal compressibility and flow on color Doppler imaging. Profunda Femoral Vein: No evidence of thrombus. Normal compressibility and flow on color Doppler imaging. Femoral Vein: No evidence of thrombus. Normal compressibility, respiratory phasicity and response to augmentation. Popliteal Vein: No evidence of thrombus. Normal compressibility, respiratory phasicity and response to augmentation. Calf Veins: Calf veins not well visualized. Superficial Great Saphenous Vein: No evidence of thrombus. Normal compressibility and flow on color Doppler imaging. Other Findings:  Edema LEFT LOWER EXTREMITY Common Femoral Vein: No evidence of thrombus. Normal compressibility, respiratory phasicity and response to augmentation. Saphenofemoral Junction: No evidence of thrombus. Normal compressibility and flow on color Doppler imaging. Profunda Femoral Vein: No evidence of thrombus. Normal compressibility and flow on color Doppler imaging. Femoral Vein: No evidence of thrombus. Normal compressibility, respiratory phasicity and response to augmentation. Popliteal Vein: No evidence of thrombus. Normal compressibility, respiratory phasicity and response to augmentation. Calf Veins: Calf veins not well visualized. Superficial Great Saphenous Vein: No evidence of thrombus. Normal compressibility and flow on color Doppler imaging. Other Findings:  Edema IMPRESSION: Sonographic survey of the bilateral lower extremities  negative for DVT. Bilateral edema. Electronically Signed   By: Corrie Mckusick D.O.   On: 09/30/2019 09:40   VAS [place] UPPER EXTREMITY ARTERIAL DUPLEX  Result Date: 09/13/2019 UPPER EXTREMITY DUPLEX STUDY Indications: Patient complains of Pre-op Access.  Performing Technologist: Charlane Ferretti RT (R)(VS)  Examination Guidelines: A complete evaluation includes B-mode imaging, spectral Doppler, color Doppler, and power Doppler as needed of all accessible portions of each vessel. Bilateral testing is considered an integral part of a complete examination. Limited examinations for reoccurring indications may be performed as noted.  Right Pre-Dialysis Findings: +-----------------------+----------+--------------------+--------+--------+ Location               PSV (cm/s)Intralum. Diam. (cm)WaveformComments +-----------------------+----------+--------------------+--------+--------+ Brachial Antecub. fossa190       0.36                                 +-----------------------+----------+--------------------+--------+--------+ Radial Art at Wrist    65        0.15                                 +-----------------------+----------+--------------------+--------+--------+ Ulnar Art at Wrist     59        0.23                                 +-----------------------+----------+--------------------+--------+--------+ Left Pre-Dialysis Findings: +-----------------------+----------+--------------------+--------+--------+ Location               PSV (cm/s)Intralum. Diam. (cm)WaveformComments +-----------------------+----------+--------------------+--------+--------+ Brachial Antecub. fossa126       0.33                                 +-----------------------+----------+--------------------+--------+--------+  Radial Art at Wrist    76        0.13                                 +-----------------------+----------+--------------------+--------+--------+ Ulnar Art at Wrist     31        0.16                                  +-----------------------+----------+--------------------+--------+--------+  Summary:  Right: No obstruction visualized in the right upper extremity. Left: No obstruction visualized in the left upper extremity. *See table(s) above for measurements and observations. Electronically signed by Hortencia Pilar MD on 09/13/2019 at 5:06:22 PM.    Final    VAS [place] UPPER EXT VEIN MAPPING (PRE-OP AVF)  Result Date: 09/13/2019 UPPER EXTREMITY VEIN MAPPING  Indications: Pre-access. Performing Technologist: Charlane Ferretti RT (R)(VS)  Examination Guidelines: A complete evaluation includes B-mode imaging, spectral Doppler, color Doppler, and power Doppler as needed of all accessible portions of each vessel. Bilateral testing is considered an integral part of a complete examination. Limited examinations for reoccurring indications may be performed as noted. +-----------------+-------------+----------+--------------+ Right Cephalic   Diameter (cm)Depth (cm)   Findings    +-----------------+-------------+----------+--------------+ Prox upper arm       0.16                              +-----------------+-------------+----------+--------------+ Mid upper arm        0.12                              +-----------------+-------------+----------+--------------+ Dist upper arm                          not visualized +-----------------+-------------+----------+--------------+ Antecubital fossa                       not visualized +-----------------+-------------+----------+--------------+ Prox forearm                            not visualized +-----------------+-------------+----------+--------------+ Mid forearm                             not visualized +-----------------+-------------+----------+--------------+ Dist forearm                            not visualized +-----------------+-------------+----------+--------------+  +-----------------+-------------+----------+--------+ Right Basilic    Diameter (cm)Depth (cm)Findings +-----------------+-------------+----------+--------+ Prox upper arm       0.41                        +-----------------+-------------+----------+--------+ Mid upper arm        0.30                        +-----------------+-------------+----------+--------+ Dist upper arm       0.25                        +-----------------+-------------+----------+--------+ Antecubital fossa    0.19                        +-----------------+-------------+----------+--------+  Prox forearm         0.22                        +-----------------+-------------+----------+--------+ +-----------------+-------------+----------+--------------+ Left Cephalic    Diameter (cm)Depth (cm)   Findings    +-----------------+-------------+----------+--------------+ Prox upper arm       0.13                              +-----------------+-------------+----------+--------------+ Mid upper arm        0.11                              +-----------------+-------------+----------+--------------+ Dist upper arm                          not visualized +-----------------+-------------+----------+--------------+ Antecubital fossa                       not visualized +-----------------+-------------+----------+--------------+ Prox forearm         0.12                              +-----------------+-------------+----------+--------------+ Mid forearm          0.15                              +-----------------+-------------+----------+--------------+ Dist forearm         0.11                              +-----------------+-------------+----------+--------------+ +-----------------+-------------+----------+--------+ Left Basilic     Diameter (cm)Depth (cm)Findings +-----------------+-------------+----------+--------+ Prox upper arm       0.35                         +-----------------+-------------+----------+--------+ Mid upper arm        0.38                        +-----------------+-------------+----------+--------+ Dist upper arm       0.31                        +-----------------+-------------+----------+--------+ Antecubital fossa    0.30                        +-----------------+-------------+----------+--------+ Prox forearm         0.16                        +-----------------+-------------+----------+--------+ Summary: Right: Right cephalic vein is not visualized from mid to distal        forearm. Right cephalic and basilic veins are compressable. Left: Left cephalic and basilic veins are compressable. *See table(s) above for measurements and observations.  Diagnosing physician: Hortencia Pilar MD Electronically signed by Hortencia Pilar MD on 09/13/2019 at 5:06:24 PM.    Final    Assessment/Plan The patient is an 85 year old female with multiple medical issues including chronic kidney disease who presented to the [facility] emergency department progressively worsening shortness of breath.  1.  End-stage Renal Disease: Patient with known history  of chronic kidney disease which has progressed to end stage renal disease.  Nephrology would like to initiate dialysis at this time however the patient does not have an adequate dialysis access.  Vascular surgery was consulted to place a PermCath.  This will allow the patient to dialyze in the inpatient and outpatient setting she is awaiting to undergo permanent dialysis placement.  Procedure, risks and benefits explained to the patient and her friend who was at bedside.  All questions were answered.  The patient wishes to proceed.  We will plan on PermCath placement tomorrow afternoon with Dr. Delana Meyer.  2.  Lower extremity edema: Patient has been seen in our outpatient setting and treated with Unna wraps.  This has been continued in the hospital.  3.  Anemia of chronic  disease: This is followed by the patient's nephrologist.  Discussed with Dr. Francene Castle, PA-C  10/01/2019 3:47 PM  This note was created with Dragon medical transcription system.  Any error is purely unintentional

## 2019-10-01 NOTE — Progress Notes (Signed)
PROGRESS NOTE    Misty Berry  LEX:[id number] DOB: [birth year] DOA: 09/28/2019 PCP: Nelia Shi, MD    Brief Narrative:  Misty Berry a 85 y.o.femalewith medical history significant ofchronic kidney disease stage IV, anemia of chronic kidney disease,left eye blindnessosteoarthritis, GERD, chronic systolic heart failure with echo on January 2021 revealing EF of 35 to 40% presents with complaints of dyspnea on exertion that started yesterday. She is unable to tell me how much she can walk before she becomes short of breath because she has not been paying attention. Denies orthopnea. Denies chest pain or chest pressure. She does not remember what she ate yesterday or whether she had a salty food. Denies any fever, chills, dizziness, or lightheadedness.    Consultants:   Nephrology  Procedures:   Antimicrobials:      Subjective: She feels better.  She is unsure she is still at her baseline.  Her oxygen requirement is less.  This morning we had a discussion she wants to proceed with hemodialysis.  Objective: Vitals:   10/01/19 0746 10/01/19 1156 10/01/19 1409 10/01/19 1548  BP: (!) 136/58 (!) 144/54 (!) 159/67 (!) 153/71  Pulse: 63 66 68 64  Resp: 17 18  19   Temp: 98.4 F (36.9 C) 98.4 F (36.9 C)  98.1 F (36.7 C)  TempSrc: Oral Oral    SpO2: 92% 92%  93%  Weight:      Height:        Intake/Output Summary (Last 24 hours) at 10/01/2019 1655 Last data filed at 10/01/2019 1549 Gross per 24 hour  Intake 960 ml  Output 1751 ml  Net -791 ml   Filed Weights   09/29/19 1507 09/30/19 0537 10/01/19 0350  Weight: 93.3 kg 93.8 kg 92.5 kg    Examination:  General exam: Appears calm and comfortable, NAD, appears better Respiratory system: Clear to auscultation. Respiratory effort normal.  Wheeze or rhonchi Cardiovascular system: S1 & S2 heard, RRR. No JVD, murmurs, rubs, gallops or clicks. No pedal edema. Gastrointestinal system: Abdomen is  nondistended, soft and nontender.. Normal bowel sounds heard. Central nervous system: Alert and oriented x3 Extremities: Right leg wrapped, left leg positive edema Skin: Warm dry Psychiatry: Judgement and insight appear normal. Mood & affect appropriate.     Data Reviewed: I have personally reviewed following labs and imaging studies  CBC: Recent Labs  Lab 09/28/19 1552  WBC 9.6  NEUTROABS 8.3*  HGB 8.5*  HCT 27.7*  MCV 97.2  PLT 449   Basic Metabolic Panel: Recent Labs  Lab 09/28/19 1552 09/29/19 0614 09/30/19 0600 10/01/19 0450  NA 143 144 142 142  K 4.3 4.3 4.4 4.3  CL 110 107 108 108  CO2 24 28 27 27   GLUCOSE 113* 96 90 92  BUN 60* 59* 60* 66*  CREATININE 3.09* 3.24* 3.17* 3.39*  CALCIUM 8.2* 8.6* 8.5* 8.5*   GFR: Estimated Creatinine Clearance: 14.7 mL/min (A) (by C-G formula based on SCr of 3.39 mg/dL (H)). Liver Function Tests: No results for input(s): AST, ALT, ALKPHOS, BILITOT, PROT, ALBUMIN in the last 168 hours. No results for input(s): LIPASE, AMYLASE in the last 168 hours. No results for input(s): AMMONIA in the last 168 hours. Coagulation Profile: No results for input(s): INR, PROTIME in the last 168 hours. Cardiac Enzymes: No results for input(s): CKTOTAL, CKMB, CKMBINDEX, TROPONINI in the last 168 hours. BNP (last 3 results) No results for input(s): PROBNP in the last 8760 hours. HbA1C: No results for input(s): HGBA1C in  the last 72 hours. CBG: No results for input(s): GLUCAP in the last 168 hours. Lipid Profile: No results for input(s): CHOL, HDL, LDLCALC, TRIG, CHOLHDL, LDLDIRECT in the last 72 hours. Thyroid Function Tests: No results for input(s): TSH, T4TOTAL, FREET4, T3FREE, THYROIDAB in the last 72 hours. Anemia Panel: No results for input(s): VITAMINB12, FOLATE, FERRITIN, TIBC, IRON, RETICCTPCT in the last 72 hours. Sepsis Labs: No results for input(s): PROCALCITON, LATICACIDVEN in the last 168 hours.  Recent Results (from the past  240 hour(s))  SARS CORONAVIRUS 2 (TAT 6-24 HRS) Nasopharyngeal Nasopharyngeal Swab     Status: None   Collection Time: 09/28/19  5:27 PM   Specimen: Nasopharyngeal Swab  Result Value Ref Range Status   SARS Coronavirus 2 NEGATIVE NEGATIVE Final    Comment: (NOTE) SARS-CoV-2 target nucleic acids are NOT DETECTED. The SARS-CoV-2 RNA is generally detectable in upper and lower respiratory specimens during the acute phase of infection. Negative results do not preclude SARS-CoV-2 infection, do not rule out co-infections with other pathogens, and should not be used as the sole basis for treatment or other patient management decisions. Negative results must be combined with clinical observations, patient history, and epidemiological information. The expected result is Negative. Fact Sheet for Patients: SugarRoll.be Fact Sheet for Healthcare Providers: https://www.woods-mathews.com/ This test is not yet approved or cleared by the [place] FDA and  has been authorized for detection and/or diagnosis of SARS-CoV-2 by FDA under an Emergency Use Authorization (EUA). This EUA will remain  in effect (meaning this test can be used) for the duration of the COVID-19 declaration under Section 56 4(b)(1) of the Act, 21 U.S.C. section 360bbb-3(b)(1), unless the authorization is terminated or revoked sooner. Performed at [facility], [place] [address], [place], [place] [address]          Radiology Studies: No results found.      Scheduled Meds: . acidophilus  2 capsule Oral TID  . aspirin EC  81 mg Oral Daily  . bumetanide  1 mg Oral Daily  . busPIRone  5 mg Oral Daily  . calcium-vitamin D  1 tablet Oral Daily  . cephALEXin  500 mg Oral Q12H  . [START ON 10/02/2019] Chlorhexidine Gluconate Cloth  6 each Topical Q0600  . docusate sodium  100 mg Oral BID  . ferrous gluconate  324 mg Oral Daily  . heparin  5,000 Units Subcutaneous Q8H  .  hydrALAZINE  25 mg Oral Q8H  . metoprolol succinate  25 mg Oral Daily  . omega-3 acid ethyl esters  1,000 mg Oral Daily  . pantoprazole  40 mg Oral Daily  . polyethylene glycol  17 g Oral Daily  . sertraline  200 mg Oral Daily  . sodium chloride flush  3 mL Intravenous Q12H  . vitamin B-12  1,000 mcg Oral Daily   Continuous Infusions: . sodium chloride      Assessment & Plan:   Active Problems:   CHF (congestive heart failure) ([place])   1.Acute on chronic systolic heart failure BNP decreasing, more euvolemic We will change IV Bumex to Bumex to p.o. Monitor electrolytes and kidney function Needs blood pressure control We will continue her beta-blocker, hydralazine IV as needed Strict I's and O's Daily weight Plan: Continue diet seen, monitor volume status will check Doppler of lower extremity to rule out DVT  2.Chronic kidney disease stage IV-creatinine appears to be at baseline of3.0's She has agreed to hemodialysis, vascular would be consulted.  Nephrology following  3.Essential hypertension-continue home beta-blocker May need to change to Coreg once more euvolemic for better blood pressure control if heart rate tolerates we will add hydralazine 10 mg 3 times daily  4.GERD-continue her PPI  5.Anemia of chronic kidney disease-currently H&H at baseline  6.Depression continue outpatient medications  DVT prophylaxis:Heparin subcu Code Status:Full code Family Communication:None at bedside Disposition Plan:Back to previous [place] life barrier: Is going to be started on hemodialysis, vascular consulted for access      LOS: 3 days   Time spent: 45 minutes with more than 50% COC    Nolberto Hanlon, MD [facility] Pager [phone]  If 7PM-7AM, please contact night-coverage [email or web address] Password [facility] 10/01/2019, 4:55 PM

## 2019-10-01 NOTE — Progress Notes (Signed)
PHARMACY NOTE:  ANTIMICROBIAL RENAL DOSAGE ADJUSTMENT  Current antimicrobial regimen includes a mismatch between antimicrobial dosage and estimated renal function.  As per policy approved by the Pharmacy & Therapeutics and Medical Executive Committees, the antimicrobial dosage will be adjusted accordingly.  Current antimicrobial dosage:  Keflex 500 mg Q8H   Renal Function:  Estimated Creatinine Clearance: 14.7 mL/min (A) (by C-G formula based on SCr of 3.39 mg/dL (H)). Per chart review, patient declines dialysis.    Antimicrobial dosage has been changed to:  Keflex 500 mg Q12H   Thank you for allowing pharmacy to be a part of this patient's care.  Rowland Lathe, [facility] 10/01/2019 11:19 AM

## 2019-10-01 NOTE — TOC Initial Note (Signed)
Transition of Care [facility]) - Initial/Assessment Note    Patient Details  Name: Misty Berry MRN: [id number] Date of Birth: December 19, 1934  Transition of Care [facility]) CM/SW Contact:    Eileen Stanford, LCSW Phone Number: 10/01/2019, 11:52 AM  Clinical Narrative:     CSW spoke with pt at bedside. Pt is from [place]. Pt is agreeable to SNF at d/c. Pt has been to [facility] before and does not want to go there again. Pt is agreeable to [facility] given location. CSW will send referral.              Expected Discharge Plan: Skilled Nursing Facility Barriers to Discharge: Continued Medical Work up   Patient Goals and CMS Choice Patient states their goals for this hospitalization and ongoing recovery are:: to get better   Choice offered to / list presented to : Patient  Expected Discharge Plan and Services Expected Discharge Plan: [place] In-house Referral: Clinical Social Work   Post Acute Care Choice: [place] Living arrangements for the past 2 months: [place]                                      Prior Living Arrangements/Services Living arrangements for the past 2 months: [place] Lives with:: Self Patient language and need for interpreter reviewed:: Yes Do you feel safe going back to the place where you live?: Yes      Need for Family Participation in Patient Care: Yes (Comment) Care giver support system in place?: Yes (comment)   Criminal Activity/Legal Involvement Pertinent to Current Situation/Hospitalization: No - Comment as needed  Activities of Daily Living   ADL Screening (condition at time of admission) Patient's cognitive ability adequate to safely complete daily activities?: Yes Is the patient deaf or have difficulty hearing?: No Does the patient have difficulty seeing, even when wearing glasses/contacts?: No Does the patient have difficulty concentrating, remembering, or making decisions?: Yes Patient  able to express need for assistance with ADLs?: Yes Does the patient have difficulty dressing or bathing?: No Independently performs ADLs?: Yes (appropriate for developmental age) Does the patient have difficulty walking or climbing stairs?: Yes Weakness of Legs: Both Weakness of Arms/Hands: None  Permission Sought/Granted Permission sought to share information with : Family Supports    Share Information with NAME: Jenny Reichmann  Permission granted to share info w AGENCY: [facility], [facility]  Permission granted to share info w Relationship: brother     Emotional Assessment Appearance:: Appears stated age Attitude/Demeanor/Rapport: Engaged Affect (typically observed): Accepting, Appropriate Orientation: : Oriented to Self, Oriented to Place, Oriented to  Time, Oriented to Situation Alcohol / Substance Use: Not Applicable Psych Involvement: No (comment)  Admission diagnosis:  Shortness of breath [R06.02] CHF (congestive heart failure) (HCC) [I50.9] Dyspnea on exertion [R06.00] Acute on chronic diastolic congestive heart failure (HCC) [I50.33] Patient Active Problem List   Diagnosis Date Noted  . CHF (congestive heart failure) ([place]) 09/28/2019  . Lymphedema 09/10/2019  . Iron deficiency anemia 08/07/2019  . Anemia in stage 4 chronic kidney disease ([place]) 08/07/2019  . Acute respiratory failure with hypoxia ([place]) 07/02/2019  . Anemia of chronic kidney failure, stage 5 ([place]) 07/02/2019  . Dyspnea   . Hypoxemia 06/27/2019  . Acute on chronic systolic (congestive) heart failure ([place]) 06/27/2019  . Acid reflux 08/27/2015  . Cellulitis 08/27/2015  . Abnormal bruising 08/27/2015  . Arthritis, degenerative 08/27/2015  .  OP (osteoporosis) 08/27/2015  . Presence of artificial eye 08/27/2015  . History of surgical procedure 08/27/2015  . Hypertensive heart disease with stage 4 chronic kidney disease ([place]) 04/30/2015  . Laceration of forearm without foreign body 04/30/2015  . Venous  insufficiency of leg 02/28/2015  . Gluteal tendinitis 01/22/2015  . Degenerative arthritis of lumbar spine 12/12/2014  . Back muscle spasm 11/14/2014  . Degeneration of intervertebral disc of lumbar region 11/14/2014  . Neuritis or radiculitis due to rupture of lumbar intervertebral disc 11/14/2014  . Allergic rhinitis 08/31/2014  . Clinical depression 02/19/2014  . Gait instability 02/19/2014  . Benign hypertensive heart and kidney disease 10/31/2013  . Left heart failure ([place]) 10/31/2013  . Branch retinal vein occlusion 09/11/2013  . Asthma 06/20/2013  . Essential hypertension 06/20/2013  . Follow-up examination 04/30/2013  . Acute renal failure ([place]) 02/16/2013  . Fall 02/16/2013  . Closed fracture of part of neck of femur ([place]) 02/16/2013  . Cellulitis and abscess of leg 10/30/2012  . Cellulitis of lower extremity 10/30/2012  . CKD (chronic kidney disease), stage V ([place]) 10/30/2012  . Central retinal edema, cystoid 01/31/2012  . Impaired vision 12/17/2011   PCP:  Nelia Shi, MD Pharmacy:   [facility] [place], [place] [facility] [address] [place] [facility] [place] [address] Phone: [phone] Fax: [phone]  [place], [place] [address] [place] [address] Phone: [phone] Fax: [phone]     Social Determinants of Health (SDOH) Interventions    Readmission Risk Interventions No flowsheet data found.

## 2019-10-01 NOTE — Progress Notes (Signed)
PT Cancellation Note  Patient Details Name: Misty Berry MRN: [id number] DOB: Nov 30, 1934   Cancelled Treatment:    Reason Eval/Treat Not Completed: Other (comment).  Pt currently busy with nursing care.  Will re-attempt PT treatment session at a later date/time as able.  Leitha Bleak, PT 10/01/19, 3:21 PM

## 2019-10-01 NOTE — Progress Notes (Signed)
[facility]  ROUNDING NOTE   Subjective:  Patient continues to have significant renal dysfunction. Creatinine currently 3.39 with an EGFR of 12. Case discussed with Dr. Smith Mince of [facility] nephrology as well today. Patient now open to proceeding with renal placement therapy in the form of hemodialysis.   Objective:  Vital signs in last 24 hours:  Temp:  [98.3 F (36.8 C)-98.4 F (36.9 C)] 98.4 F (36.9 C) (04/19 1156) Pulse Rate:  [62-70] 68 (04/19 1409) Resp:  [16-18] 18 (04/19 1156) BP: (133-159)/(54-71) 159/67 (04/19 1409) SpO2:  [90 %-100 %] 92 % (04/19 1156) Weight:  [92.5 kg] 92.5 kg (04/19 0350)  Weight change: -0.771 kg Filed Weights   09/29/19 1507 09/30/19 0537 10/01/19 0350  Weight: 93.3 kg 93.8 kg 92.5 kg    Intake/Output: I/O last 3 completed shifts: In: -  Out: 1401 [Urine:1400; Stool:1]   Intake/Output this shift:  Total I/O In: 720 [P.O.:720] Out: 400 [Urine:400]  Physical Exam: General: No acute distress  Head: Normocephalic, atraumatic. Moist oral mucosal membranes  Eyes: Anicteric  Neck: Supple, trachea midline  Lungs:  Clear to auscultation, normal effort  Heart: S1S2 no rubs  Abdomen:  Soft, nontender, bowel sounds present  Extremities: 1+ peripheral edema.  Neurologic: Awake, alert, following commands  Skin: No lesions  Access: None    Basic Metabolic Panel: Recent Labs  Lab 09/28/19 1552 09/28/19 1552 09/29/19 0614 09/30/19 0600 10/01/19 0450  NA 143  --  144 142 142  K 4.3  --  4.3 4.4 4.3  CL 110  --  107 108 108  CO2 24  --  '28 27 27  ' GLUCOSE 113*  --  96 90 92  BUN 60*  --  59* 60* 66*  CREATININE 3.09*  --  3.24* 3.17* 3.39*  CALCIUM 8.2*   < > 8.6* 8.5* 8.5*   < > = values in this interval not displayed.    Liver Function Tests: No results for input(s): AST, ALT, ALKPHOS, BILITOT, PROT, ALBUMIN in the last 168 hours. No results for input(s): LIPASE, AMYLASE in the last 168 hours. No results for input(s):  AMMONIA in the last 168 hours.  CBC: Recent Labs  Lab 09/28/19 1552  WBC 9.6  NEUTROABS 8.3*  HGB 8.5*  HCT 27.7*  MCV 97.2  PLT 191    Cardiac Enzymes: No results for input(s): CKTOTAL, CKMB, CKMBINDEX, TROPONINI in the last 168 hours.  BNP: Invalid input(s): POCBNP  CBG: No results for input(s): GLUCAP in the last 168 hours.  Microbiology: Results for orders placed or performed during the hospital encounter of 09/28/19  SARS CORONAVIRUS 2 (TAT 6-24 HRS) Nasopharyngeal Nasopharyngeal Swab     Status: None   Collection Time: 09/28/19  5:27 PM   Specimen: Nasopharyngeal Swab  Result Value Ref Range Status   SARS Coronavirus 2 NEGATIVE NEGATIVE Final    Comment: (NOTE) SARS-CoV-2 target nucleic acids are NOT DETECTED. The SARS-CoV-2 RNA is generally detectable in upper and lower respiratory specimens during the acute phase of infection. Negative results do not preclude SARS-CoV-2 infection, do not rule out co-infections with other pathogens, and should not be used as the sole basis for treatment or other patient management decisions. Negative results must be combined with clinical observations, patient history, and epidemiological information. The expected result is Negative. Fact Sheet for Patients: SugarRoll.be Fact Sheet for Healthcare Providers: https://www.woods-mathews.com/ This test is not yet approved or cleared by the [place] FDA and  has been authorized for  detection and/or diagnosis of SARS-CoV-2 by FDA under an Emergency Use Authorization (EUA). This EUA will remain  in effect (meaning this test can be used) for the duration of the COVID-19 declaration under Section 56 4(b)(1) of the Act, 21 U.S.C. section 360bbb-3(b)(1), unless the authorization is terminated or revoked sooner. Performed at [facility], [place] [address], [place], [place] [address]     Coagulation Studies: No results for input(s):  LABPROT, INR in the last 72 hours.  Urinalysis: No results for input(s): COLORURINE, LABSPEC, PHURINE, GLUCOSEU, HGBUR, BILIRUBINUR, KETONESUR, PROTEINUR, UROBILINOGEN, NITRITE, LEUKOCYTESUR in the last 72 hours.  Invalid input(s): APPERANCEUR    Imaging: US Venous Img Lower Bilateral (DVT)  Result Date: 09/30/2019 CLINICAL DATA:  85 year old female with a history of swelling EXAM: BILATERAL LOWER EXTREMITY VENOUS DOPPLER ULTRASOUND TECHNIQUE: Gray-scale sonography with graded compression, as well as color Doppler and duplex ultrasound were performed to evaluate the lower extremity deep venous systems from the level of the common femoral vein and including the common femoral, femoral, profunda femoral, popliteal and calf veins including the posterior tibial, peroneal and gastrocnemius veins when visible. The superficial great saphenous vein was also interrogated. Spectral Doppler was utilized to evaluate flow at rest and with distal augmentation maneuvers in the common femoral, femoral and popliteal veins. COMPARISON:  None. FINDINGS: RIGHT LOWER EXTREMITY Common Femoral Vein: No evidence of thrombus. Normal compressibility, respiratory phasicity and response to augmentation. Saphenofemoral Junction: No evidence of thrombus. Normal compressibility and flow on color Doppler imaging. Profunda Femoral Vein: No evidence of thrombus. Normal compressibility and flow on color Doppler imaging. Femoral Vein: No evidence of thrombus. Normal compressibility, respiratory phasicity and response to augmentation. Popliteal Vein: No evidence of thrombus. Normal compressibility, respiratory phasicity and response to augmentation. Calf Veins: Calf veins not well visualized. Superficial Great Saphenous Vein: No evidence of thrombus. Normal compressibility and flow on color Doppler imaging. Other Findings:  Edema LEFT LOWER EXTREMITY Common Femoral Vein: No evidence of thrombus. Normal compressibility, respiratory phasicity  and response to augmentation. Saphenofemoral Junction: No evidence of thrombus. Normal compressibility and flow on color Doppler imaging. Profunda Femoral Vein: No evidence of thrombus. Normal compressibility and flow on color Doppler imaging. Femoral Vein: No evidence of thrombus. Normal compressibility, respiratory phasicity and response to augmentation. Popliteal Vein: No evidence of thrombus. Normal compressibility, respiratory phasicity and response to augmentation. Calf Veins: Calf veins not well visualized. Superficial Great Saphenous Vein: No evidence of thrombus. Normal compressibility and flow on color Doppler imaging. Other Findings:  Edema IMPRESSION: Sonographic survey of the bilateral lower extremities negative for DVT. Bilateral edema. Electronically Signed   By: Corrie Mckusick D.O.   On: 09/30/2019 09:40     Medications:   . sodium chloride     . acidophilus  2 capsule Oral TID  . aspirin EC  81 mg Oral Daily  . bumetanide  1 mg Oral Daily  . busPIRone  5 mg Oral Daily  . calcium-vitamin D  1 tablet Oral Daily  . cephALEXin  500 mg Oral Q12H  . docusate sodium  100 mg Oral BID  . ferrous gluconate  324 mg Oral Daily  . heparin  5,000 Units Subcutaneous Q8H  . hydrALAZINE  25 mg Oral Q8H  . metoprolol succinate  25 mg Oral Daily  . omega-3 acid ethyl esters  1,000 mg Oral Daily  . pantoprazole  40 mg Oral Daily  . polyethylene glycol  17 g Oral Daily  . sertraline  200 mg  Oral Daily  . sodium chloride flush  3 mL Intravenous Q12H  . vitamin B-12  1,000 mcg Oral Daily   sodium chloride, acetaminophen, ondansetron (ZOFRAN) IV, sodium chloride flush, traMADol  Assessment/ Plan:  85 y.o. female with past medical history of hypertension, chronic systolic heart failure, chronic kidney disease stage V baseline EGFR 13, asthma, GERD, hyperlipidemia, history of bifrontal subdural hemorrhages, dementia Admitted with acute on chronic systolic heart failure.  Ejection fraction 35 to  40%.  1.  Chronic kidney disease stage V.  Patient continues to have significant renal dysfunction.  EGFR now down to 12 with a creatinine of 3.39.  Patient now open to renal placement therapy.  As such we will plan for PermCath placement tomorrow and initiation of hemodialysis treatment.  2.  Acute on chronic systolic heart failure.  Maintain the patient on Bumex at this time.  Transitioned to oral treatment.  3.  Anemia of chronic kidney disease.  We will consider starting the patient on Epogen with dialysis treatments.  4.  Secondary hyperparathyroidism.  Check serum phosphorus with dialysis treatment tomorrow.  LOS: 3 Misty Berry 4/19/20213:28 PM

## 2019-10-01 NOTE — Care Management Important Message (Signed)
Important Message  Patient Details  Name: Misty Berry MRN: [id number] Date of Birth: 1934/08/14   Medicare Important Message Given:  Yes     Dannette Barbara 10/01/2019, 12:22 PM

## 2019-10-01 NOTE — Consult Note (Signed)
[facility] Nurse Consult Note: Reason for Consult:Unna boot to right leg. Healed lesion to right anterior lower leg.  Edema remains Stage 2 sacral wound Wound type:venous Pressure Injury POA: Yes Measurement:sacrum;  2 cm x 2 cm x 0.2 cm  Right lower leg  Newly epithelialized lesion to anteiror leg:  1 cm round Nonblanchable erythema to right heel and right lateral foot Wound ATF:TDDUKG with fibrin Drainage (amount, consistency, odor) none Periwound:edema to right leg Dressing procedure/placement/frequency:Unna boot to right leg twice weekly Silicone foam to sacrum. Change every three days.  Will not follow at this time.  Please re-consult if needed.  Domenic Moras MSN, RN, FNP-BC CWON Wound, Ostomy, Continence Nurse Pager [phone]

## 2019-10-02 ENCOUNTER — Encounter: Payer: Self-pay | Admitting: Internal Medicine

## 2019-10-02 ENCOUNTER — Inpatient Hospital Stay
Admission: EM | Disposition: A | Discharge status: Skilled Nursing Facility | Payer: Self-pay | Source: Skilled Nursing Facility | Attending: Internal Medicine

## 2019-10-02 DIAGNOSIS — Z7189 Other specified counseling: Secondary | ICD-10-CM

## 2019-10-02 DIAGNOSIS — I1310 Hypertensive heart and chronic kidney disease without heart failure, with stage 1 through stage 4 chronic kidney disease, or unspecified chronic kidney disease: Secondary | ICD-10-CM

## 2019-10-02 DIAGNOSIS — L03319 Cellulitis of trunk, unspecified: Secondary | ICD-10-CM

## 2019-10-02 DIAGNOSIS — N185 Chronic kidney disease, stage 5: Secondary | ICD-10-CM

## 2019-10-02 DIAGNOSIS — I132 Hypertensive heart and chronic kidney disease with heart failure and with stage 5 chronic kidney disease, or end stage renal disease: Principal | ICD-10-CM

## 2019-10-02 DIAGNOSIS — I5033 Acute on chronic diastolic (congestive) heart failure: Secondary | ICD-10-CM

## 2019-10-02 DIAGNOSIS — Z515 Encounter for palliative care: Secondary | ICD-10-CM

## 2019-10-02 HISTORY — PX: DIALYSIS/PERMA CATHETER INSERTION: CATH118288

## 2019-10-02 LAB — HEPATITIS B SURFACE ANTIGEN: Hepatitis B Surface Ag: NONREACTIVE

## 2019-10-02 LAB — BASIC METABOLIC PANEL
Anion gap: 9 (ref 5–15)
BUN: 62 mg/dL — ABNORMAL HIGH (ref 8–23)
CO2: 23 mmol/L (ref 22–32)
Calcium: 8.6 mg/dL — ABNORMAL LOW (ref 8.9–10.3)
Chloride: 107 mmol/L (ref 98–111)
Creatinine, Ser: 3.24 mg/dL — ABNORMAL HIGH (ref 0.44–1.00)
GFR calc Af Amer: 15 mL/min — ABNORMAL LOW (ref >60)
GFR calc non Af Amer: 13 mL/min — ABNORMAL LOW (ref >60)
Glucose, Bld: 87 mg/dL (ref 70–99)
Potassium: 5.1 mmol/L (ref 3.5–5.1)
Sodium: 139 mmol/L (ref 135–145)

## 2019-10-02 LAB — PHOSPHORUS: Phosphorus: 4.9 mg/dL — ABNORMAL HIGH (ref 2.5–4.6)

## 2019-10-02 LAB — HEPATITIS C ANTIBODY: HCV Ab: NONREACTIVE

## 2019-10-02 LAB — HEPATITIS B CORE ANTIBODY, TOTAL: Hep B Core Total Ab: NONREACTIVE

## 2019-10-02 SURGERY — DIALYSIS/PERMA CATHETER INSERTION
Anesthesia: Choice

## 2019-10-02 MED ORDER — ALTEPLASE 2 MG IJ SOLR
2.0000 mg | Freq: Once | INTRAMUSCULAR | Status: DC | PRN
  Filled 2019-10-02: qty 2

## 2019-10-02 MED ORDER — DIPHENHYDRAMINE HCL 50 MG/ML IJ SOLN: 50.0000 mg | Freq: Once | INTRAMUSCULAR | Status: DC | PRN

## 2019-10-02 MED ORDER — FENTANYL CITRATE (PF) 100 MCG/2ML IJ SOLN
INTRAMUSCULAR | Status: AC
  Filled 2019-10-02: qty 2

## 2019-10-02 MED ORDER — CEFAZOLIN SODIUM-DEXTROSE 1-4 GM/50ML-% IV SOLN: 1.0000 g | Freq: Once | INTRAVENOUS | Status: DC

## 2019-10-02 MED ORDER — MIDAZOLAM HCL 2 MG/2ML IJ SOLN
INTRAMUSCULAR | Status: DC | PRN
  Administered 2019-10-02 (×2): 1 mg via INTRAVENOUS

## 2019-10-02 MED ORDER — LIDOCAINE HCL (PF) 1 % IJ SOLN
5.0000 mL | INTRAMUSCULAR | Status: DC | PRN
  Filled 2019-10-02: qty 5

## 2019-10-02 MED ORDER — HEPARIN SODIUM (PORCINE) 10000 UNIT/ML IJ SOLN
INTRAMUSCULAR | Status: AC
  Filled 2019-10-02: qty 1

## 2019-10-02 MED ORDER — FENTANYL CITRATE (PF) 100 MCG/2ML IJ SOLN
INTRAMUSCULAR | Status: DC | PRN
  Administered 2019-10-02: 50 ug via INTRAVENOUS
  Administered 2019-10-02: 25 ug via INTRAVENOUS

## 2019-10-02 MED ORDER — PENTAFLUOROPROP-TETRAFLUOROETH EX AERO
1.0000 "application " | INHALATION_SPRAY | CUTANEOUS | Status: DC | PRN
  Filled 2019-10-02: qty 30

## 2019-10-02 MED ORDER — SODIUM CHLORIDE 0.9 % IV SOLN: 100.0000 mL | INTRAVENOUS | Status: DC | PRN

## 2019-10-02 MED ORDER — MIDAZOLAM HCL 2 MG/ML PO SYRP: 8.0000 mg | ORAL_SOLUTION | Freq: Once | ORAL | Status: DC | PRN

## 2019-10-02 MED ORDER — HYDROMORPHONE HCL 1 MG/ML IJ SOLN: 1.0000 mg | Freq: Once | INTRAMUSCULAR | Status: DC | PRN

## 2019-10-02 MED ORDER — FAMOTIDINE 20 MG PO TABS: 40.0000 mg | ORAL_TABLET | Freq: Once | ORAL | Status: DC | PRN

## 2019-10-02 MED ORDER — ONDANSETRON HCL 4 MG/2ML IJ SOLN: 4.0000 mg | Freq: Four times a day (QID) | INTRAMUSCULAR | Status: DC | PRN

## 2019-10-02 MED ORDER — MIDAZOLAM HCL 5 MG/5ML IJ SOLN
INTRAMUSCULAR | Status: AC
  Filled 2019-10-02: qty 5

## 2019-10-02 MED ORDER — LIDOCAINE-PRILOCAINE 2.5-2.5 % EX CREA
1.0000 "application " | TOPICAL_CREAM | CUTANEOUS | Status: DC | PRN
  Filled 2019-10-02: qty 5

## 2019-10-02 MED ORDER — HEPARIN SODIUM (PORCINE) 1000 UNIT/ML DIALYSIS
1000.0000 [IU] | INTRAMUSCULAR | Status: DC | PRN
  Filled 2019-10-02: qty 1

## 2019-10-02 MED ORDER — SODIUM CHLORIDE 0.9 % IV SOLN: INTRAVENOUS | Status: DC

## 2019-10-02 MED ORDER — CEFAZOLIN SODIUM-DEXTROSE 1-4 GM/50ML-% IV SOLN
INTRAVENOUS | Status: AC
  Filled 2019-10-02: qty 50

## 2019-10-02 SURGICAL SUPPLY — 10 items
CATH PALINDROME RT-P 15FX19CM (CATHETERS) ×3 IMPLANT
DERMABOND ADVANCED (GAUZE/BANDAGES/DRESSINGS) ×2
DERMABOND ADVANCED .7 DNX12 (GAUZE/BANDAGES/DRESSINGS) ×1 IMPLANT
NEEDLE ENTRY 21GA 7CM ECHOTIP (NEEDLE) ×3 IMPLANT
PACK ANGIOGRAPHY (CUSTOM PROCEDURE TRAY) ×3 IMPLANT
SET INTRO CAPELLA COAXIAL (SET/KITS/TRAYS/PACK) ×3 IMPLANT
SUT MNCRL AB 4-0 PS2 18 (SUTURE) ×3 IMPLANT
SUT SILK 0 FSL (SUTURE) ×3 IMPLANT
SUT VIC AB 3-0 SH 27 (SUTURE) ×2
SUT VIC AB 3-0 SH 27X BRD (SUTURE) ×1 IMPLANT

## 2019-10-02 NOTE — Plan of Care (Signed)
  Problem: Education: Goal: Knowledge of General Education information will improve Description: Including pain rating scale, medication(s)/side effects and non-pharmacologic comfort measures Outcome: Not Progressing Note: Patient just ripped her own IV out and is demanding to leave AMA. I requested palliative care nurse to come back to the room and talk to the patient regarding the seriousness of her physical condition. Awaiting result of this conversation. Will continue to monitor disposition for the remainder of the shift. Wenda Low [facility]

## 2019-10-02 NOTE — Progress Notes (Signed)
Patient just experienced a significant nosebleed. Cleaned up and given a large guaze pad. Instructed to hold pressure just underneath bridge of nose and to lean head back to assist with the clotting process for about 5 minutes. Will continue to monitor until time of transfer to other unit. Wenda Low [facility]

## 2019-10-02 NOTE — Progress Notes (Signed)
This note also relates to the following rows which could not be included: Pulse Rate - Cannot attach notes to unvalidated device data Resp - Cannot attach notes to unvalidated device data BP - Cannot attach notes to unvalidated device data  tx discontinued per patients request with complaints of " I dont feel right". Dr Holley Raring notified.

## 2019-10-02 NOTE — Progress Notes (Addendum)
Addendum to previous note:   Contacted by patient's nurse that patient declined DNR, and additionally had pulled out her IV and was demanding to leave the hospital.  I sat and spoke with patient with her partner Remo Lipps on speakerphone, and then with her brother Jenny Reichmann (her HCPOA) on speakerphone. Adelai stated she did not want dialysis and wanted to go home. Alaze stated she was not sick, that her breathing was fine when she came in the hospital, and that her kidneys were fine.  I again reviewed Kayslee's illnesses- kidney failure, congestive heart failure. We discussed how the kidneys and heart work together and that without dialysis Hartleigh's heart failure will worsen and she will die.  Acknowledged her feelings that dialysis made her uncomfortable. Additionally, addressed her concerns that she would be living permanently in the hospital and not able to go see her doctor that she was seeing before she was admitted. Reviewed with her that she would need to stay in the hospital and complete dialysis until stable, and then she will be able to return to [facility] and continue dialysis at the dialysis center.  Option of stopping dialysis and discharging with Hospice was also discussed.  We also discussed her code status- on my earlier visit patient, her partner and her HCPOA had agreed that patient requested DNR. I asked patient about this again with her HCPOA on speakerphone. Leyna first stated that she wanted full effort for resuscitation. I discussed with her that given her poor health and multiple comorbidities, that CPR would likely leave her in a worse functional state than she is now, and would not likely be able to liberate from a ventilator. Ela stated she would not want to live in a state worse than she is now, and she did not think that CPR would be beneficial to her. She agreed with DNR order.  Vaughan Basta discussed with Jenny Reichmann that if she were at end of life and going to die her preference would be  to be in her own house. I discussed with both [place] and [name] what services Hospice can provide in the home- and that if she required 24 hour care- would recommend that they supplement Hospice care with privately paid caregivers.   Plan:  -Continue current scope care, attempt dialysis again tomorrow -DNR -I recommend that all discussions regarding patient's plan of care take place with either her HCPOA or her partner Remo Lipps present- it is clear that Elvia's dementia and poor recall impair her ability to independently make healthcare decisions. I discussed this with [name] as well- we discussed the differences in capacity and competency, as well as the fact that capacity can wax and wane. Encouraged [name] to be involved in [place] when making healthcare decisions.  Additional face to face time: 60 minutes Time in: 1545 Time out: 1645  Greater than 50% of this time was spent in counseling and coordination of care related to patient's care.

## 2019-10-02 NOTE — Progress Notes (Signed)
Called and gave report to 1A nurse at this time. All questions answered. Will tube any available medications. Will transport to that unit momentarily. Wenda Low [facility]

## 2019-10-02 NOTE — Progress Notes (Signed)
This note also relates to the following rows which could not be included: Pulse Rate - Cannot attach notes to unvalidated device data Resp - Cannot attach notes to unvalidated device data BP - Cannot attach notes to unvalidated device data SpO2 - Cannot attach notes to unvalidated device data  Hd started  

## 2019-10-02 NOTE — Consult Note (Signed)
Consultation Note Date: 10/02/2019   Patient Name: Misty Berry  DOB: December 10, 1934  MRN: [id number]  Age / Sex: 85 y.o., female  PCP: Powell-Tillman, Sara Chu, MD Referring Physician: Nolberto Hanlon, MD  Reason for Consultation: Establishing goals of care  HPI/Patient Profile: 85 y.o. female  with past medical history of CKD IV, chronic anemia, R eye blindness, osteoarthritis, GERD, chronic systolic heart failure, EF 35-40%, vascular dementia,  admitted on 09/28/2019 with dyspnea on exertion. Workup reveals acute on chronic heart failure exacerbation, worsening chronic kidney disease. Nephrology consulted- plan to initiate HD, vascular placed HD cath today. Palliative consulted for goals of care.     Clinical Assessment and Goals of Care: Met with patient and her partner, Remo Lipps at bedside. Patient is oriented to person and place but not so much to situation. When I ask her why she is in the hospital she tells me she really isn't sure. Reviewed her health trajectory with her.  She tells me she lives at home and is independent with ADL's. Upon further discussion with her partner- this is not accurate- she lives at [facility] and requires assistance with ADL's. Remo Lipps notes that Dorise's dementia has gotten worse over the last several months. She is minimally ambulatory- uses a wheelchair frequently. Discussed what Allyiah considers quality of life. She enjoys being able to interact with others, she enjoys watching movies.  Discussed goals of dialysis- to improve quality of life, to prolong what she considers good quality of life.  Remo Lipps and [place] are aware that at some point in the future dialysis may not be prolonging what she considers a good quality of life. Discussed what that would look like, and how Hospice may make that transition easier.  Advanced care planning- patient's brother- Beckie Salts is  designated HCPOA. Patient states she would not want to be kept on life support long term, and Remo Lipps then reminds patient she has designated herself DNR. We discussed what DNR means and patient and Remo Lipps are in agreement with DNR order.  Patient wishes to d/c back to [facility] memory care unit.  All of the above was additionally discussed with Beckie Salts- he has also emailed me a copy of patient's [place]. He agrees with DNR status. Offered outpatient Palliative to see patient at discharge- [name] agrees.     Primary Decision Maker HCPOA- Beckie Salts    SUMMARY OF RECOMMENDATIONS -Continue current level of care- plan for initiation of HD soon -DNR order entered -Transition of care referral for outpatient Palliative followup at [place]:  DNR  Prognosis:    Unable to determine  Discharge Planning: Home with Palliative Services  Primary Diagnoses: Present on Admission: **None**   I have reviewed the medical record, interviewed the patient and family, and examined the patient. The following aspects are pertinent.  Past Medical History:  Diagnosis Date  . Asthma   . Bruises easily   . Cellulitis   . Cough   . DDD (degenerative disc disease), lumbar   .  GERD (gastroesophageal reflux disease)   . HBP (high blood pressure)   . Hypercholesterolemia   . Hyperparathyroidism ([place])   . Osteoarthritis   . Osteoporosis   . Rash   . Renal disorder    kidney disease  . Swelling    Social History   Socioeconomic History  . Marital status: Single    Spouse name: Not on file  . Number of children: 0  . Years of education: Not on file  . Highest education level: Not on file  Occupational History  . Not on file  Tobacco Use  . Smoking status: Never Smoker  . Smokeless tobacco: Never Used  Substance and Sexual Activity  . Alcohol use: No  . Drug use: No  . Sexual activity: Not on file  Other Topics Concern  . Not on file  Social History  Narrative   Lives with room mate   Social Determinants of Health   Financial Resource Strain:   . Difficulty of Paying Living Expenses:   Food Insecurity:   . Worried About Charity fundraiser in the Last Year:   . Arboriculturist in the Last Year:   Transportation Needs:   . Film/video editor (Medical):   Marland Kitchen Lack of Transportation (Non-Medical):   Physical Activity:   . Days of Exercise per Week:   . Minutes of Exercise per Session:   Stress:   . Feeling of Stress :   Social Connections:   . Frequency of Communication with Friends and Family:   . Frequency of Social Gatherings with Friends and Family:   . Attends Religious Services:   . Active Member of Clubs or Organizations:   . Attends Archivist Meetings:   Marland Kitchen Marital Status:    History reviewed. No pertinent family history. Scheduled Meds: . acidophilus  2 capsule Oral TID  . aspirin EC  81 mg Oral Daily  . bumetanide  1 mg Oral Daily  . busPIRone  5 mg Oral Daily  . calcium-vitamin D  1 tablet Oral Daily  . cephALEXin  500 mg Oral Q12H  . Chlorhexidine Gluconate Cloth  6 each Topical Q0600  . docusate sodium  100 mg Oral BID  . fentaNYL      . ferrous gluconate  324 mg Oral Daily  . heparin      . heparin  5,000 Units Subcutaneous Q8H  . hydrALAZINE  25 mg Oral Q8H  . metoprolol succinate  25 mg Oral Daily  . midazolam      . omega-3 acid ethyl esters  1,000 mg Oral Daily  . pantoprazole  40 mg Oral Daily  . polyethylene glycol  17 g Oral Daily  . sertraline  200 mg Oral Daily  . sodium chloride flush  3 mL Intravenous Q12H  . vitamin B-12  1,000 mcg Oral Daily   Continuous Infusions: . sodium chloride    . sodium chloride    . sodium chloride     PRN Meds:.sodium chloride, sodium chloride, sodium chloride, acetaminophen, alteplase, heparin, HYDROmorphone (DILAUDID) injection, lidocaine (PF), lidocaine-prilocaine, ondansetron (ZOFRAN) IV, ondansetron (ZOFRAN) IV,  pentafluoroprop-tetrafluoroeth, sodium chloride flush, traMADol Medications Prior to Admission:  Prior to Admission medications   Medication Sig Start Date End Date Taking? Authorizing Provider  aspirin EC 81 MG tablet Take 81 mg by mouth daily.    Yes [provider]  busPIRone (BUSPAR) 5 MG tablet  08/16/19  Yes [provider]  calcium-vitamin D (OSCAL WITH D)  500-200 MG-UNIT TABS tablet Take 1 tablet by mouth daily.    Yes [provider]  Cholecalciferol 50 MCG (2000 UT) CAPS Take 2,000 Units by mouth daily.   Yes [provider]  clobetasol ointment (TEMOVATE) 6.62 % Apply 1 application topically 2 (two) times daily. To shins   Yes [provider]  docusate sodium (COLACE) 100 MG capsule Take 1 capsule (100 mg total) by mouth 2 (two) times daily. 07/02/19  Yes Danford, Suann Larry, MD  esomeprazole (NEXIUM) 40 MG capsule Take 40 mg by mouth daily.    Yes [provider]  ferrous gluconate (FERGON) 324 MG tablet Take 324 mg by mouth daily.    Yes [provider]  L-Methylfolate-Algae (DEPLIN 15) 15-90.314 MG CAPS Take 15 mg by mouth daily.    Yes [provider]  L-Theanine 100 MG CAPS Take 800 mg by mouth daily.    Yes [provider]  metoprolol succinate (TOPROL-XL) 50 MG 24 hr tablet Take 50 mg by mouth daily. Hold for pulse less than 50   Yes [provider]  MILK THISTLE PO Take 1 tablet by mouth daily.    Yes [provider]  Multiple Vitamins-Minerals (CENTRUM SILVER 50+WOMEN) TABS Take 1 tablet by mouth daily.   Yes [provider]  Omega-3 Fatty Acids (FISH OIL) 1000 MG CAPS Take 4,000 mg by mouth daily.    Yes [provider]  polyethylene glycol (MIRALAX / GLYCOLAX) 17 g packet Take 17 g by mouth daily. 07/03/19  Yes Danford, Suann Larry, MD  Skin Protectants, Misc. (BAZA PROTECT EX) Apply 1 application topically See admin instructions. Apply to buttocks topically  every shift after each cleaning.   Yes [provider]  torsemide (DEMADEX) 20 MG tablet Take 40 mg by mouth daily.  09/16/17  Yes [provider]  traMADol (ULTRAM) 50 MG tablet Take 1 tablet (50 mg total) by mouth every 6 (six) hours as needed for moderate pain. 07/02/19  Yes Danford, Suann Larry, MD  ZOLOFT 100 MG tablet Take 200 mg by mouth daily.  06/07/15  Yes [provider]   Allergies  Allergen Reactions  . Ivp Dye [Iodinated Diagnostic Agents] Other (See Comments)  . Lactose   . Methylprednisolone Other (See Comments)    Reaction: Hallucinations and Psychosis  . Peanuts [Peanut Oil] Other (See Comments)    Swelling and vomitting  . Vancomycin Other (See Comments)    Reaction: unknown  . Wellbutrin [Bupropion] Other (See Comments)    Hallucinations   Review of Systems  Unable to perform ROS: Dementia    Physical Exam Vitals and nursing note reviewed.  Cardiovascular:     Pulses: Normal pulses.  Pulmonary:     Effort: Pulmonary effort is normal.  Neurological:     Mental Status: She is alert.     Comments: Memory and cognition impaired  Psychiatric:        Mood and Affect: Mood normal.        Behavior: Behavior normal.        Thought Content: Thought content normal.     Vital Signs: BP (!) 141/53   Pulse 60   Temp 98.5 F (36.9 C) (Oral)   Resp 19   Ht '5\' 6"'  (1.676 m)   Wt 93 kg   SpO2 91%   BMI 33.09 kg/m  Pain Scale: 0-10 Berry *See Group Information*: 1-Acceptable,Awake and alert Pain Score: 0-No pain   SpO2: SpO2: 91 % O2 Device:SpO2:  91 % O2 Flow Rate: .O2 Flow Rate (L/min): 2 L/min  IO: Intake/output summary:   Intake/Output Summary (Last 24 hours) at 10/02/2019 1133 Last data filed at 10/02/2019 1030 Gross per 24 hour  Intake 900 ml  Output 1500 ml  Net -600 ml    LBM: Last BM Date: 09/30/19 Baseline Weight: Weight: 79.4 kg Most recent weight: Weight: 93 kg     Palliative Assessment/Data: PPS: 40 %      Thank you for this consult. Palliative medicine will continue to follow and assist as needed.   Time In: 1000 Time Out: 1115 Time Total: 75 minutes  Greater than 50%  of this time was spent counseling and coordinating care related to the above assessment and plan.  Signed by: Mariana Kaufman, AGNP-C Palliative Medicine    Please contact Palliative Medicine Team phone at [phone] for questions and concerns.  For individual provider: See Shea Evans

## 2019-10-02 NOTE — Progress Notes (Addendum)
PROGRESS NOTE    Misty Berry  ZOX:[id number] DOB: 12-12-1934 DOA: 09/28/2019 PCP: Nelia Shi, MD    Brief Narrative:  Misty Viele Winteris a 85 y.o.femalewith medical history significant ofchronic kidney disease stage IV, anemia of chronic kidney disease,left eye blindnessosteoarthritis, GERD, chronic systolic heart failure with echo on January 2021 revealing EF of 35 to 40% presents with complaints of dyspnea on exertion that started yesterday. She is unable to tell me how much she can walk before she becomes short of breath because she has not been paying attention. Denies orthopnea. Denies chest pain or chest pressure. She does not remember what she ate yesterday or whether she had a salty food. Denies any fever, chills, dizziness, or lightheadedness.    Consultants:   Nephrology, vascular  Procedures:   Antimicrobials:   keflex   Subjective: Feels much better.  Denies any shortness of breath.  Objective: Vitals:   10/01/19 1548 10/01/19 2025 10/02/19 0348 10/02/19 0753  BP: (!) 153/71 (!) 148/57 (!) 148/60 (!) 178/53  Pulse: 64 67 76 76  Resp: 19   17  Temp: 98.1 F (36.7 C) 98.2 F (36.8 C) 97.9 F (36.6 C) 98.6 F (37 C)  TempSrc: Oral  Oral   SpO2: 93% 93% 91% 92%  Weight:   93 kg   Height:        Intake/Output Summary (Last 24 hours) at 10/02/2019 0754 Last data filed at 10/02/2019 0357 Gross per 24 hour  Intake 1620 ml  Output 1600 ml  Net 20 ml   Filed Weights   09/30/19 0537 10/01/19 0350 10/02/19 0348  Weight: 93.8 kg 92.5 kg 93 kg    Examination:  General exam: Appears calm and comfortable, NAD, appears better Respiratory system: Clear to auscultation. Respiratory effort normal.  Wheeze or rhonchi Cardiovascular system: S1 & S2 heard, RRR. No JVD, murmurs, rubs, gallops or clicks.  Gastrointestinal system: Abdomen is nondistended, soft and nontender.. Normal bowel sounds heard. Central nervous system: Alert and  oriented x3 this am Extremities: Right leg wrapped, left leg  Much decreased edema Skin: Warm dry Psychiatry: Mood & affect appropriate, in current setting.     Data Reviewed: I have personally reviewed following labs and imaging studies  CBC: Recent Labs  Lab 09/28/19 1552  WBC 9.6  NEUTROABS 8.3*  HGB 8.5*  HCT 27.7*  MCV 97.2  PLT 811   Basic Metabolic Panel: Recent Labs  Lab 09/28/19 1552 09/29/19 0614 09/30/19 0600 10/01/19 0450 10/02/19 0503  NA 143 144 142 142 139  K 4.3 4.3 4.4 4.3 5.1  CL 110 107 108 108 107  CO2 24 28 27 27 23   GLUCOSE 113* 96 90 92 87  BUN 60* 59* 60* 66* 62*  CREATININE 3.09* 3.24* 3.17* 3.39* 3.24*  CALCIUM 8.2* 8.6* 8.5* 8.5* 8.6*   GFR: Estimated Creatinine Clearance: 15.4 mL/min (A) (by C-G formula based on SCr of 3.24 mg/dL (H)). Liver Function Tests: No results for input(s): AST, ALT, ALKPHOS, BILITOT, PROT, ALBUMIN in the last 168 hours. No results for input(s): LIPASE, AMYLASE in the last 168 hours. No results for input(s): AMMONIA in the last 168 hours. Coagulation Profile: No results for input(s): INR, PROTIME in the last 168 hours. Cardiac Enzymes: No results for input(s): CKTOTAL, CKMB, CKMBINDEX, TROPONINI in the last 168 hours. BNP (last 3 results) No results for input(s): PROBNP in the last 8760 hours. HbA1C: No results for input(s): HGBA1C in the last 72 hours. CBG: No results for  input(s): GLUCAP in the last 168 hours. Lipid Profile: No results for input(s): CHOL, HDL, LDLCALC, TRIG, CHOLHDL, LDLDIRECT in the last 72 hours. Thyroid Function Tests: No results for input(s): TSH, T4TOTAL, FREET4, T3FREE, THYROIDAB in the last 72 hours. Anemia Panel: No results for input(s): VITAMINB12, FOLATE, FERRITIN, TIBC, IRON, RETICCTPCT in the last 72 hours. Sepsis Labs: No results for input(s): PROCALCITON, LATICACIDVEN in the last 168 hours.  Recent Results (from the past 240 hour(s))  SARS CORONAVIRUS 2 (TAT 6-24 HRS)  Nasopharyngeal Nasopharyngeal Swab     Status: None   Collection Time: 09/28/19  5:27 PM   Specimen: Nasopharyngeal Swab  Result Value Ref Range Status   SARS Coronavirus 2 NEGATIVE NEGATIVE Final    Comment: (NOTE) SARS-CoV-2 target nucleic acids are NOT DETECTED. The SARS-CoV-2 RNA is generally detectable in upper and lower respiratory specimens during the acute phase of infection. Negative results do not preclude SARS-CoV-2 infection, do not rule out co-infections with other pathogens, and should not be used as the sole basis for treatment or other patient management decisions. Negative results must be combined with clinical observations, patient history, and epidemiological information. The expected result is Negative. Fact Sheet for Patients: SugarRoll.be Fact Sheet for Healthcare Providers: https://www.woods-mathews.com/ This test is not yet approved or cleared by the [place] FDA and  has been authorized for detection and/or diagnosis of SARS-CoV-2 by FDA under an Emergency Use Authorization (EUA). This EUA will remain  in effect (meaning this test can be used) for the duration of the COVID-19 declaration under Section 56 4(b)(1) of the Act, 21 U.S.C. section 360bbb-3(b)(1), unless the authorization is terminated or revoked sooner. Performed at [facility], [place] [address], [place], [place] [address]          Radiology Studies: No results found.      Scheduled Meds: . acidophilus  2 capsule Oral TID  . aspirin EC  81 mg Oral Daily  . bumetanide  1 mg Oral Daily  . busPIRone  5 mg Oral Daily  . calcium-vitamin D  1 tablet Oral Daily  . cephALEXin  500 mg Oral Q12H  . Chlorhexidine Gluconate Cloth  6 each Topical Q0600  . docusate sodium  100 mg Oral BID  . ferrous gluconate  324 mg Oral Daily  . heparin  5,000 Units Subcutaneous Q8H  . hydrALAZINE  25 mg Oral Q8H  . metoprolol succinate  25 mg Oral Daily    . omega-3 acid ethyl esters  1,000 mg Oral Daily  . pantoprazole  40 mg Oral Daily  . polyethylene glycol  17 g Oral Daily  . sertraline  200 mg Oral Daily  . sodium chloride flush  3 mL Intravenous Q12H  . vitamin B-12  1,000 mcg Oral Daily   Continuous Infusions: . sodium chloride      Assessment & Plan:   Active Problems:   CHF (congestive heart failure) ([place])   1.Acute on chronic systolic heart failure Euvolemic now. Continue with Bumex p.o. Monitor electrolytes and kidney function Needs blood pressure control We will continue her beta-blocker, hydralazine IV as needed Strict I's and O's Daily weight   2.Chronic kidney disease stage V-creatinine appears to be at baseline of3.0's She has agreed to hemodialysis, vascular would be consulted.  s/p Nephrology tunneled dialysis catheter of the right IJ Nephrology following-we will be starting on hemodialysis  3.Essential hypertension-continue home beta-blocker May need to change to Coreg once more euvolemic for better blood pressure control if  heart rate tolerates we will add hydralazine 10 mg 3 times daily  4.GERD-continue her PPI  5.Anemia of chronic kidney disease-currently H&H at baseline  6.Depression continue outpatient medications  6.mild ?cellulitis of chest- on keflex. Last dose on 4/21  DVT prophylaxis:Heparin subcu Code Status:Full code Family Communication:None at bedside Disposition Plan:Back to previous [place] Means life barrier: Is going to be started on hemodialysis, will need outpatient hemodialysis spot      LOS: 4 days   Time spent: 45 minutes with more than 50% COC    Nolberto Hanlon, MD [facility] Pager [phone]  If 7PM-7AM, please contact night-coverage [email or web address] Password [facility] 10/02/2019, 7:54 AM Patient ID: Misty Berry, female   DOB: [birth year], 85 y.o.   MRN: [id number]

## 2019-10-02 NOTE — Progress Notes (Signed)
Patient off unit for procedure. Will resume care upon return. Misty Berry [facility]

## 2019-10-02 NOTE — Op Note (Signed)
OPERATIVE NOTE   PROCEDURE: 1. Insertion of tunneled dialysis catheter right IJ approach with ultrasound and fluoroscopic guidance.  PRE-OPERATIVE DIAGNOSIS: Acute on chronic renal insufficiency now requiring hemodialysis  POST-OPERATIVE DIAGNOSIS: Same  SURGEON: Hortencia Pilar.  ANESTHESIA: Conscious sedation was administered under my direct supervision by the interventional radiology RN. IV Versed plus fentanyl were utilized. Continuous ECG, pulse oximetry and blood pressure was monitored throughout the entire procedure. Conscious sedation was for a total of 35 minutes.  ESTIMATED BLOOD LOSS: Minimal cc  CONTRAST USED:  None  FLUOROSCOPY TIME: 0.3 minutes  INDICATIONS:   Misty Herbig Winteris a 85 y.o. y.o. female who presents with worsening renal function she now requires hemodialysis and therefore is undergoing placement of a tunneled dialysis catheter risks and benefits of been reviewed patient agrees to proceed.  DESCRIPTION: After obtaining full informed written consent, the patient was positioned supine. The right neck and chest wall was prepped and draped in a sterile fashion. Ultrasound was placed in a sterile sleeve. Ultrasound was utilized to identify the right internal jugular vein which is noted to be echolucent and compressible indicating patency. Image is recorded for the permanent record. Under direct ultrasound visualization a micro-needle is inserted into the vein followed by the micro-wire. Micro-sheath was then advanced and a J wire is inserted without difficulty under fluoroscopic guidance. Small counterincision was made at the wire insertion site. Dilators are passed over the wire and the tunneled dialysis catheter is fed into the central venous system without difficulty.  Under fluoroscopy the catheter tip positioned at the atrial caval junction. The catheter is then approximated to the right chest wall and an exit site selected. 1% lidocaine is infiltrated in soft  tissues at this level small incision is made and the tunneling device is then passed from the exit site to the right neck counterincision. Catheter is then connected to the tunneling device and the catheter was pulled subcutaneously. It is then transected and the hub assembly connected without difficulty. Both lumens aspirate and flush easily. After verification of smooth contour with proper tip position under fluoroscopy the catheter is packed with 5000 units of heparin per lumen.  Catheter secured to the skin of the right chest wall with 0 silk. A sterile dressing is applied with a Biopatch.  COMPLICATIONS: None  CONDITION: Good  Hortencia Pilar [place] [facility]. Office:  [phone]   10/02/2019,9:10 AM

## 2019-10-02 NOTE — Progress Notes (Signed)
PT Cancellation Note  Patient Details Name: Misty Berry MRN: [id number] DOB: 04-26-1935   Cancelled Treatment:    Reason Eval/Treat Not Completed: Patient at procedure or test/unavailable.  Pt currently off floor for procedure.  Will re-attempt PT treatment session at a later date/time as able.  Leitha Bleak, PT 10/02/19, 8:15 AM

## 2019-10-03 DIAGNOSIS — R609 Edema, unspecified: Secondary | ICD-10-CM

## 2019-10-03 LAB — BASIC METABOLIC PANEL
Anion gap: 7 (ref 5–15)
BUN: 57 mg/dL — ABNORMAL HIGH (ref 8–23)
CO2: 25 mmol/L (ref 22–32)
Calcium: 8.7 mg/dL — ABNORMAL LOW (ref 8.9–10.3)
Chloride: 108 mmol/L (ref 98–111)
Creatinine, Ser: 3.26 mg/dL — ABNORMAL HIGH (ref 0.44–1.00)
GFR calc Af Amer: 15 mL/min — ABNORMAL LOW (ref >60)
GFR calc non Af Amer: 13 mL/min — ABNORMAL LOW (ref >60)
Glucose, Bld: 92 mg/dL (ref 70–99)
Potassium: 4.1 mmol/L (ref 3.5–5.1)
Sodium: 140 mmol/L (ref 135–145)

## 2019-10-03 LAB — PARATHYROID HORMONE, INTACT (NO CA): PTH: 104 pg/mL — ABNORMAL HIGH (ref 15–65)

## 2019-10-03 LAB — HEPATITIS B SURFACE ANTIBODY, QUANTITATIVE: Hep B S AB Quant (Post): <3.1 m[IU]/mL — ABNORMAL LOW (ref >9.9)

## 2019-10-03 MED ORDER — EPOETIN ALFA 4000 UNIT/ML IJ SOLN
4000.0000 [IU] | INTRAMUSCULAR | Status: DC
  Administered 2019-10-05: 4000 [IU] via INTRAVENOUS
  Filled 2019-10-03: qty 1

## 2019-10-03 MED ORDER — LOSARTAN POTASSIUM 50 MG PO TABS
50.0000 mg | ORAL_TABLET | Freq: Every day | ORAL | Status: DC
  Administered 2019-10-03 – 2019-10-06 (×4): 50 mg via ORAL
  Filled 2019-10-03 (×4): qty 1

## 2019-10-03 NOTE — Progress Notes (Signed)
HD started. 

## 2019-10-03 NOTE — Progress Notes (Signed)
Post HD  

## 2019-10-03 NOTE — TOC Progression Note (Signed)
Transition of Care [facility]) - Progression Note    Patient Details  Name: Misty Berry MRN: [id number] Date of Birth: 1935/04/22  Transition of Care [facility]) CM/SW [place], RN Phone Number: 10/03/2019, 1:18 PM  Clinical Narrative:    Spoke with the patient, she tells me that she lives at home with a room mate, she is willing to go to [facility] for rehab then go back home,  She is alert but has dementia and is not really a good historian due to Dementia, I called her brother Misty Berry Her [place] and he stated that she lives at [facility] assisted living and that he agrees to her going to [facility], I accepted the bed offer with [facility] and called Estill Bamberg the dialysis coordinator, Nanine Means and [facility] will transport to the same facilities. Estill Bamberg will follow up with the brother Misty Berry as well as the physician concerning setting up a chair time for the patient.    Expected Discharge Plan: [place] Barriers to Discharge: Continued Medical Work up  Expected Discharge Plan and Services Expected Discharge Plan: [place] In-house Referral: Clinical Social Work   Post Acute Care Choice: [place] Living arrangements for the past 2 months: [place]                                       Social Determinants of Health (SDOH) Interventions    Readmission Risk Interventions No flowsheet data found.

## 2019-10-03 NOTE — TOC Progression Note (Signed)
Transition of Care [facility]) - Progression Note    Patient Details  Name: Misty Berry MRN: [id number] Date of Birth: 26-May-1935  Transition of Care [facility]) CM/SW [place], RN Phone Number: 10/03/2019, 1:50 PM  Clinical Narrative:    Brother called and left a message stating that due to Dementia the patient will need a sitter to sit with her he is willing to pay for it, I called Claiborne Billings at [facility] and relayed the information, she stated that they do not send a sitter with the patient that the brother would have to work that out with dialysis and hire someone.  The patient is no demented enough to need a sitter at any point   Expected Discharge Plan: [place] Barriers to Discharge: Continued Medical Work up  Expected Discharge Plan and Services Expected Discharge Plan: [place] In-house Referral: Clinical Social Work   Post Acute Care Choice: [place] Living arrangements for the past 2 months: [place]                                       Social Determinants of Health (SDOH) Interventions    Readmission Risk Interventions No flowsheet data found.

## 2019-10-03 NOTE — Plan of Care (Signed)
  Problem: Education: Goal: Ability to verbalize understanding of medication therapies will improve Outcome: Progressing   Problem: Activity: Goal: Capacity to carry out activities will improve Outcome: Progressing   

## 2019-10-03 NOTE — Progress Notes (Signed)
[facility]  ROUNDING NOTE   Subjective:  Patient only had 20 minutes of initial dialysis treatment performed yesterday. Therefore she came back for a repeat session of her first dialysis treatment today. She tolerated this better today.   Objective:  Vital signs in last 24 hours:  Temp:  [97.5 F (36.4 C)-98.5 F (36.9 C)] 97.5 F (36.4 C) (04/21 0826) Pulse Rate:  [60-76] 71 (04/21 1056) Resp:  [17-26] 18 (04/21 1056) BP: (127-196)/(62-79) 127/79 (04/21 1056) SpO2:  [95 %-99 %] 99 % (04/21 1056)  Weight change: 0.012 kg Filed Weights   10/02/19 0348 10/02/19 0808 10/02/19 1410  Weight: 93 kg 93 kg 95 kg    Intake/Output: I/O last 3 completed shifts: In: 300 [P.O.:300] Out: 2035 [Urine:1550]   Intake/Output this shift:  Total I/O In: 3 [I.V.:3] Out: 500 [Other:500]  Physical Exam: General: No acute distress  Head: Normocephalic, atraumatic. Moist oral mucosal membranes  Eyes: Anicteric  Neck: Supple, trachea midline  Lungs:  Clear to auscultation, normal effort  Heart: S1S2 no rubs  Abdomen:  Soft, nontender, bowel sounds present  Extremities: 2+ peripheral edema.  Neurologic: Awake, alert, following commands  Skin: No rash noted  Access: Right IJ PermCath    Basic Metabolic Panel: Recent Labs  Lab 09/29/19 0614 09/29/19 0614 09/30/19 0600 09/30/19 0600 10/01/19 0450 10/02/19 0503 10/02/19 0504 10/03/19 0506  NA 144  --  142  --  142 139  --  140  K 4.3  --  4.4  --  4.3 5.1  --  4.1  CL 107  --  108  --  108 107  --  108  CO2 28  --  27  --  27 23  --  25  GLUCOSE 96  --  90  --  92 87  --  92  BUN 59*  --  60*  --  66* 62*  --  57*  CREATININE 3.24*  --  3.17*  --  3.39* 3.24*  --  3.26*  CALCIUM 8.6*   < > 8.5*   < > 8.5* 8.6*  --  8.7*  PHOS  --   --   --   --   --   --  4.9*  --    < > = values in this interval not displayed.    Liver Function Tests: No results for input(s): AST, ALT, ALKPHOS, BILITOT, PROT, ALBUMIN in the  last 168 hours. No results for input(s): LIPASE, AMYLASE in the last 168 hours. No results for input(s): AMMONIA in the last 168 hours.  CBC: Recent Labs  Lab 09/28/19 1552  WBC 9.6  NEUTROABS 8.3*  HGB 8.5*  HCT 27.7*  MCV 97.2  PLT 191    Cardiac Enzymes: No results for input(s): CKTOTAL, CKMB, CKMBINDEX, TROPONINI in the last 168 hours.  BNP: Invalid input(s): POCBNP  CBG: No results for input(s): GLUCAP in the last 168 hours.  Microbiology: Results for orders placed or performed during the hospital encounter of 09/28/19  SARS CORONAVIRUS 2 (TAT 6-24 HRS) Nasopharyngeal Nasopharyngeal Swab     Status: None   Collection Time: 09/28/19  5:27 PM   Specimen: Nasopharyngeal Swab  Result Value Ref Range Status   SARS Coronavirus 2 NEGATIVE NEGATIVE Final    Comment: (NOTE) SARS-CoV-2 target nucleic acids are NOT DETECTED. The SARS-CoV-2 RNA is generally detectable in upper and lower respiratory specimens during the acute phase of infection. Negative results do not preclude SARS-CoV-2 infection, do  not rule out co-infections with other pathogens, and should not be used as the sole basis for treatment or other patient management decisions. Negative results must be combined with clinical observations, patient history, and epidemiological information. The expected result is Negative. Fact Sheet for Patients: SugarRoll.be Fact Sheet for Healthcare Providers: https://www.woods-mathews.com/ This test is not yet approved or cleared by the [place] FDA and  has been authorized for detection and/or diagnosis of SARS-CoV-2 by FDA under an Emergency Use Authorization (EUA). This EUA will remain  in effect (meaning this test can be used) for the duration of the COVID-19 declaration under Section 56 4(b)(1) of the Act, 21 U.S.C. section 360bbb-3(b)(1), unless the authorization is terminated or revoked sooner. Performed at [facility], [place] [address], [place], [place] [address]     Coagulation Studies: No results for input(s): LABPROT, INR in the last 72 hours.  Urinalysis: No results for input(s): COLORURINE, LABSPEC, PHURINE, GLUCOSEU, HGBUR, BILIRUBINUR, KETONESUR, PROTEINUR, UROBILINOGEN, NITRITE, LEUKOCYTESUR in the last 72 hours.  Invalid input(s): APPERANCEUR    Imaging: PERIPHERAL VASCULAR CATHETERIZATION  Result Date: 10/02/2019 See Op Note    Medications:   . sodium chloride    . sodium chloride    . sodium chloride     . acidophilus  2 capsule Oral TID  . aspirin EC  81 mg Oral Daily  . bumetanide  1 mg Oral Daily  . busPIRone  5 mg Oral Daily  . calcium-vitamin D  1 tablet Oral Daily  . cephALEXin  500 mg Oral Q12H  . Chlorhexidine Gluconate Cloth  6 each Topical Q0600  . docusate sodium  100 mg Oral BID  . ferrous gluconate  324 mg Oral Daily  . heparin  5,000 Units Subcutaneous Q8H  . hydrALAZINE  25 mg Oral Q8H  . losartan  50 mg Oral Daily  . metoprolol succinate  25 mg Oral Daily  . omega-3 acid ethyl esters  1,000 mg Oral Daily  . pantoprazole  40 mg Oral Daily  . polyethylene glycol  17 g Oral Daily  . sertraline  200 mg Oral Daily  . sodium chloride flush  3 mL Intravenous Q12H  . vitamin B-12  1,000 mcg Oral Daily   sodium chloride, sodium chloride, sodium chloride, acetaminophen, alteplase, heparin, HYDROmorphone (DILAUDID) injection, lidocaine (PF), lidocaine-prilocaine, ondansetron (ZOFRAN) IV, ondansetron (ZOFRAN) IV, pentafluoroprop-tetrafluoroeth, sodium chloride flush, traMADol  Assessment/ Plan:  85 y.o. female with past medical history of hypertension, chronic systolic heart failure, chronic kidney disease stage V baseline EGFR 13, asthma, GERD, hyperlipidemia, history of bifrontal subdural hemorrhages, dementia Admitted with acute on chronic systolic heart failure.  Ejection fraction 35 to 40%.  1.  Chronic kidney disease stage V.  Patient underwent first  dialysis treatment today.  She cut her dialysis treatment short yesterday.  Right IJ PermCath intact.  2.  Acute on chronic systolic heart failure.  Okay to continue bumetanide for now.  Begin additional ultrafiltration with dialysis tomorrow.  3.  Anemia of chronic kidney disease.  Hemoglobin 8.5.  Consider starting the patient on Epogen tomorrow.  4.  Secondary hyperparathyroidism.  Phosphorus 4.9 and acceptable.  Continue to monitor bone metabolism parameters.  LOS: 5 Tadan Shill 4/21/20213:01 PM

## 2019-10-03 NOTE — Progress Notes (Signed)
Physical Therapy Treatment Patient Details Name: Misty Berry MRN: [id number] DOB: 03/07/1935 Today's Date: 10/03/2019    History of Present Illness per MD note:Hokulani D Labombard is a 85 y.o. female with medical history significant of chronic kidney disease stage IV, anemia of chronic kidney disease, left eye blindness osteoarthritis, GERD, chronic systolic heart failure with echo on January 2021 revealing EF of 35 to 40% presents with complaints of dyspnea on exertion that started yesterday.  She is unable to tell me how much she can walk before she becomes short of breath because she has not been paying attention.  Denies orthopnea.  Denies chest pain or chest pressure.  She does not remember what she ate yesterday or whether she had a salty food.  Denies any fever, chills, dizziness, or lightheadedness.    PT Comments    Pt was long sitting in bed upon arriving. She did not remember having HD earlier but was A and O x 4. She is very motivated to get better and was agreeable to trial OOB activity. Pt required max assist to exit L side of bed with vcs and increased time. Stood 3 x EOB with max assist + vcs and gait belt. Able to stand ~ 30 sec prior to need seated rest. She did take 3 lateral steps towards [facility] but poor foot clearance and poor gait posture. Pt slightly SOB but o2 > 92% and HR stable. RN in room at conclusion of session with pt in supine with HOB elevated and call bell in reach. Pt is progressing with PT but will benefit from SNF to address deficits with strength, balance, and safe functional mobility.    Follow Up Recommendations  SNF     Equipment Recommendations  Rolling walker with 5" wheels    Recommendations for Other Services       Precautions / Restrictions Precautions Precautions: None Restrictions Weight Bearing Restrictions: No    Mobility  Bed Mobility Overal bed mobility: Needs Assistance Bed Mobility: Supine to Sit;Sit to Supine     Supine to sit: Max  assist Sit to supine: Max assist   General bed mobility comments: Pt required max assist + increase time to progress from supine to EOB sitting. Max assist progressing BLEs and with trunk support.  Transfers Overall transfer level: Needs assistance Equipment used: Rolling walker (2 wheeled) Transfers: Sit to/from Stand Sit to Stand: From elevated surface;Max assist         General transfer comment: Pt stood 3 x EOB (elevated) with vcs for fwd wt shift, hand placement, and overall technique. Pt very pleased she was able to stand. Only able to tolerate standing ~ 30 sec prior to requesting to sit and rest.  Ambulation/Gait Ambulation/Gait assistance: Mod assist Gait Distance (Feet): 3 Feet Assistive device: Rolling walker (2 wheeled) Gait Pattern/deviations: Step-to pattern Gait velocity: decreased   General Gait Details: pt was able to take 3 steps from FOB to [facility] with incraesed time and max vcs for sequencing.   Stairs             Wheelchair Mobility    Modified Rankin (Stroke Patients Only)       Balance                                            Cognition Arousal/Alertness: Awake/alert Behavior During Therapy: WFL for tasks assessed/performed Overall Cognitive  Status: Within Functional Limits for tasks assessed                                 General Comments: Pt is alert and oriented x 4 and motivated to get better. She requires increase time to perform all desired task.      Exercises      General Comments        Pertinent Vitals/Pain Pain Assessment: No/denies pain    Home Living                      Prior Function            PT Goals (current goals can now be found in the care plan section) Acute Rehab PT Goals Patient Stated Goal: " I want to be able to move better" Progress towards PT goals: Progressing toward goals    Frequency    Min 2X/week      PT Plan Current plan remains  appropriate    Co-evaluation              AM-PAC PT "6 Clicks" Mobility   Outcome Measure  Help needed turning from your back to your side while in a flat bed without using bedrails?: A Lot Help needed moving from lying on your back to sitting on the side of a flat bed without using bedrails?: A Lot Help needed moving to and from a bed to a chair (including a wheelchair)?: A Lot Help needed standing up from a chair using your arms (e.g., wheelchair or bedside chair)?: A Lot Help needed to walk in hospital room?: A Lot Help needed climbing 3-5 steps with a railing? : Total 6 Click Score: 11    End of Session Equipment Utilized During Treatment: Gait belt Activity Tolerance: Patient limited by fatigue Patient left: in bed;with call bell/phone within reach;with bed alarm set;with nursing/sitter in room Nurse Communication: Mobility status PT Visit Diagnosis: Muscle weakness (generalized) (M62.81);Difficulty in walking, not elsewhere classified (R26.2)     Time: 6712-4580 PT Time Calculation (min) (ACUTE ONLY): 10 min  Charges:  $Therapeutic Activity: 8-22 mins                     Julaine Fusi PTA 10/03/19, 4:48 PM

## 2019-10-03 NOTE — Progress Notes (Signed)
PROGRESS NOTE    Misty Berry  OKH:[id number] DOB: 1935/05/22 DOA: 09/28/2019 PCP: Nelia Shi, MD   Brief Narrative:  Misty Tatro Winteris a 85 y.o.femalewith medical history significant ofchronic kidney disease stage IV, anemia of chronic kidney disease,left eye blindnessosteoarthritis, GERD, chronic systolic heart failure with echo on January 2021 revealing EF of 35 to 40% presents with complaints of dyspnea on exertion that started yesterday. She is unable to tell me how much she can walk before she becomes short of breath because she has not been paying attention. Denies orthopnea. Denies chest pain or chest pressure.   Subjective: Patient has no new complaints today.  She was seen during dialysis session and tolerating it well.  Assessment & Plan:   Active Problems:   CHF (congestive heart failure) (HCC)   Hypertensive heart and renal disease   Palliative care by specialist   Advanced care planning/counseling discussion   Acute on chronic diastolic heart failure (HCC)  Acute on chronic systolic heart failure.  Appears euvolemic now after starting dialysis.  EF of 35 to 40% -Continue Bumex. -Continue beta-blocker. -Leave weight and strict ins and outs.  Stage V CKD now ESRD.  Tunneled catheter was placed yesterday and she was started on dialysis.  Today was her second day of dialysis. -Waiting for clip procedure to be completed. -PT is recommending SNF placement.  Hypertension.  Blood pressure within goal. -Continue home dose of beta-blocker. -Continue hydralazine 10 mg 3 times a day.  GERD. -Continue with Protonix  Anemia of chronic kidney disease.  Hemoglobin stable. -Continue to monitor.  Mild cellulitis of chest.  Patient will complete the course of Keflex today.  Depression. -Continue home meds.  Objective: Vitals:   10/03/19 1045 10/03/19 1054 10/03/19 1056 10/03/19 1602  BP: (!) 165/74 131/75 127/79 (!) 151/58  Pulse: 73 69 71 72   Resp: 20 19 18    Temp:    97.7 F (36.5 C)  TempSrc:    Oral  SpO2: 98% 98% 99% 99%  Weight:      Height:        Intake/Output Summary (Last 24 hours) at 10/03/2019 1734 Last data filed at 10/03/2019 1700 Gross per 24 hour  Intake 3 ml  Output 2650 ml  Net -2647 ml   Filed Weights   10/02/19 0348 10/02/19 0808 10/02/19 1410  Weight: 93 kg 93 kg 95 kg    Examination:  General exam: Appears calm and comfortable  Respiratory system: Clear to auscultation. Respiratory effort normal. Cardiovascular system: S1 & S2 heard, RRR. No JVD, murmurs, rubs, gallops or clicks. Gastrointestinal system: Soft, nontender, nondistended, bowel sounds positive. Central nervous system: Alert and oriented. No focal neurological deficits. Extremities: 2+ LE edema, right with Unna boot, no cyanosis, pulses intact and symmetrical. Psychiatry: Judgement and insight appear normal. Mood & affect appropriate.    DVT prophylaxis: Heparin Code Status: DNR Family Communication: Discussed with patient.  Disposition Plan:  Status is: Inpatient  Remains inpatient appropriate because:Inpatient level of care appropriate due to severity of illness   Dispo: The patient is from: Home              Anticipated d/c is to: SNF              Anticipated d/c date is: 2 days              Patient currently is not medically stable to d/c.  Consultants:   Nephrology  Vascular surgery  Palliative care  Procedures:  Antimicrobials:   Data Reviewed: I have personally reviewed following labs and imaging studies  CBC: Recent Labs  Lab 09/28/19 1552  WBC 9.6  NEUTROABS 8.3*  HGB 8.5*  HCT 27.7*  MCV 97.2  PLT 161   Basic Metabolic Panel: Recent Labs  Lab 09/29/19 0614 09/30/19 0600 10/01/19 0450 10/02/19 0503 10/02/19 0504 10/03/19 0506  NA 144 142 142 139  --  140  K 4.3 4.4 4.3 5.1  --  4.1  CL 107 108 108 107  --  108  CO2 28 27 27 23   --  25  GLUCOSE 96 90 92 87  --  92  BUN 59* 60* 66*  62*  --  57*  CREATININE 3.24* 3.17* 3.39* 3.24*  --  3.26*  CALCIUM 8.6* 8.5* 8.5* 8.6*  --  8.7*  PHOS  --   --   --   --  4.9*  --    GFR: Estimated Creatinine Clearance: 15.5 mL/min (A) (by C-G formula based on SCr of 3.26 mg/dL (H)). Liver Function Tests: No results for input(s): AST, ALT, ALKPHOS, BILITOT, PROT, ALBUMIN in the last 168 hours. No results for input(s): LIPASE, AMYLASE in the last 168 hours. No results for input(s): AMMONIA in the last 168 hours. Coagulation Profile: No results for input(s): INR, PROTIME in the last 168 hours. Cardiac Enzymes: No results for input(s): CKTOTAL, CKMB, CKMBINDEX, TROPONINI in the last 168 hours. BNP (last 3 results) No results for input(s): PROBNP in the last 8760 hours. HbA1C: No results for input(s): HGBA1C in the last 72 hours. CBG: No results for input(s): GLUCAP in the last 168 hours. Lipid Profile: No results for input(s): CHOL, HDL, LDLCALC, TRIG, CHOLHDL, LDLDIRECT in the last 72 hours. Thyroid Function Tests: No results for input(s): TSH, T4TOTAL, FREET4, T3FREE, THYROIDAB in the last 72 hours. Anemia Panel: No results for input(s): VITAMINB12, FOLATE, FERRITIN, TIBC, IRON, RETICCTPCT in the last 72 hours. Sepsis Labs: No results for input(s): PROCALCITON, LATICACIDVEN in the last 168 hours.  Recent Results (from the past 240 hour(s))  SARS CORONAVIRUS 2 (TAT 6-24 HRS) Nasopharyngeal Nasopharyngeal Swab     Status: None   Collection Time: 09/28/19  5:27 PM   Specimen: Nasopharyngeal Swab  Result Value Ref Range Status   SARS Coronavirus 2 NEGATIVE NEGATIVE Final    Comment: (NOTE) SARS-CoV-2 target nucleic acids are NOT DETECTED. The SARS-CoV-2 RNA is generally detectable in upper and lower respiratory specimens during the acute phase of infection. Negative results do not preclude SARS-CoV-2 infection, do not rule out co-infections with other pathogens, and should not be used as the sole basis for treatment or other  patient management decisions. Negative results must be combined with clinical observations, patient history, and epidemiological information. The expected result is Negative. Fact Sheet for Patients: SugarRoll.be Fact Sheet for Healthcare Providers: https://www.woods-mathews.com/ This test is not yet approved or cleared by the [place] FDA and  has been authorized for detection and/or diagnosis of SARS-CoV-2 by FDA under an Emergency Use Authorization (EUA). This EUA will remain  in effect (meaning this test can be used) for the duration of the COVID-19 declaration under Section 56 4(b)(1) of the Act, 21 U.S.C. section 360bbb-3(b)(1), unless the authorization is terminated or revoked sooner. Performed at [facility], [place] [address] [place], [place] [address]      Radiology Studies: PERIPHERAL VASCULAR CATHETERIZATION  Result Date: 10/02/2019 See Op Note   Scheduled Meds: . acidophilus  2 capsule  Oral TID  . aspirin EC  81 mg Oral Daily  . bumetanide  1 mg Oral Daily  . busPIRone  5 mg Oral Daily  . calcium-vitamin D  1 tablet Oral Daily  . cephALEXin  500 mg Oral Q12H  . Chlorhexidine Gluconate Cloth  6 each Topical Q0600  . docusate sodium  100 mg Oral BID  . [START ON 10/05/2019] epoetin (EPOGEN/PROCRIT) injection  4,000 Units Intravenous Q M,W,F-HD  . ferrous gluconate  324 mg Oral Daily  . heparin  5,000 Units Subcutaneous Q8H  . hydrALAZINE  25 mg Oral Q8H  . losartan  50 mg Oral Daily  . metoprolol succinate  25 mg Oral Daily  . omega-3 acid ethyl esters  1,000 mg Oral Daily  . pantoprazole  40 mg Oral Daily  . polyethylene glycol  17 g Oral Daily  . sertraline  200 mg Oral Daily  . sodium chloride flush  3 mL Intravenous Q12H  . vitamin B-12  1,000 mcg Oral Daily   Continuous Infusions: . sodium chloride    . sodium chloride    . sodium chloride       LOS: 5 days   Time spent: 40 minutes.  Lorella Nimrod, MD [facility]  If 7PM-7AM, please contact night-coverage [email or web address]  10/03/2019, 5:34 PM   This record has been created using Systems analyst. Errors have been sought and corrected,but may not always be located. Such creation errors do not reflect on the standard of care.

## 2019-10-03 NOTE — Progress Notes (Signed)
HD ended 

## 2019-10-03 NOTE — Progress Notes (Signed)
PRE hd

## 2019-10-03 NOTE — Progress Notes (Signed)
PT Cancellation Note  Patient Details Name: ANADALAY MACDONELL MRN: [id number] DOB: 01/02/1935   Cancelled Treatment:    Reason Eval/Treat Not Completed: Patient at procedure or test/unavailable.  Pt currently off unit at dialysis.  Will re-attempt PT treatment session at a later date/time.  Leitha Bleak, PT 10/03/19, 10:41 AM

## 2019-10-04 LAB — RENAL FUNCTION PANEL
Albumin: 2.7 g/dL — ABNORMAL LOW (ref 3.5–5.0)
Anion gap: 9 (ref 5–15)
BUN: 41 mg/dL — ABNORMAL HIGH (ref 8–23)
CO2: 28 mmol/L (ref 22–32)
Calcium: 9 mg/dL (ref 8.9–10.3)
Chloride: 105 mmol/L (ref 98–111)
Creatinine, Ser: 2.61 mg/dL — ABNORMAL HIGH (ref 0.44–1.00)
GFR calc Af Amer: 19 mL/min — ABNORMAL LOW (ref >60)
GFR calc non Af Amer: 16 mL/min — ABNORMAL LOW (ref >60)
Glucose, Bld: 88 mg/dL (ref 70–99)
Phosphorus: 3.7 mg/dL (ref 2.5–4.6)
Potassium: 4.2 mmol/L (ref 3.5–5.1)
Sodium: 142 mmol/L (ref 135–145)

## 2019-10-04 LAB — QUANTIFERON-TB GOLD PLUS (RQFGPL)
QuantiFERON Mitogen Value: 9.92 IU/mL
QuantiFERON Nil Value: 0.02 IU/mL
QuantiFERON TB1 Ag Value: 0.04 IU/mL
QuantiFERON TB2 Ag Value: 0.09 IU/mL

## 2019-10-04 LAB — QUANTIFERON-TB GOLD PLUS: QuantiFERON-TB Gold Plus: NEGATIVE

## 2019-10-04 NOTE — TOC Progression Note (Signed)
Transition of Care [facility]) - Progression Note    Patient Details  Name: Misty Berry MRN: [id number] Date of Birth: 06/07/1935  [facility] CM/SW [place], RN Phone Number: 10/04/2019, 10:42 AM  Clinical Narrative:    Called the dialysis Coordinator with patient pathways and left a VM for a call back   Expected Discharge Plan: [place] Barriers to Discharge: Continued Medical Work up  Expected Discharge Plan and Services Expected Discharge Plan: [place] In-house Referral: Clinical Social Work   Post Acute Care Choice: [place] Living arrangements for the past 2 months: [place]                                       Social Determinants of Health (SDOH) Interventions    Readmission Risk Interventions Readmission Risk Prevention Plan 10/03/2019  Transportation Screening Complete  PCP or Specialist Appt within 3-5 Days Complete  HRI or Home Care Consult Complete  Palliative Care Screening Not Applicable  Medication Review (RN Care Manager) Referral to Pharmacy  Some recent data might be hidden

## 2019-10-04 NOTE — Progress Notes (Signed)
[facility]  ROUNDING NOTE   Subjective:  Patient seen and evaluated. Had HD today. Tolerated well.    Objective:  Vital signs in last 24 hours:  Temp:  [96.8 F (36 C)-98.2 F (36.8 C)] 96.8 F (36 C) (04/22 1230) Pulse Rate:  [67-79] 71 (04/22 1324) Resp:  [16-31] 18 (04/22 1324) BP: (103-228)/(38-215) 128/67 (04/22 1324) SpO2:  [68 %-100 %] 84 % (04/22 1324) Weight:  [88.1 kg] 88.1 kg (04/22 0534)  Weight change: -4.9 kg Filed Weights   10/02/19 0808 10/02/19 1410 10/04/19 0534  Weight: 93 kg 95 kg 88.1 kg    Intake/Output: I/O last 3 completed shifts: In: 103 [P.O.:100; I.V.:3] Out: 3200 [Urine:2700; Other:500]   Intake/Output this shift:  Total I/O In: -  Out: 500 [Other:500]  Physical Exam: General: No acute distress  Head: Normocephalic, atraumatic. Moist oral mucosal membranes  Eyes: Anicteric  Neck: Supple, trachea midline  Lungs:  Clear to auscultation, normal effort  Heart: S1S2 no rubs  Abdomen:  Soft, nontender, bowel sounds present  Extremities: 2+ peripheral edema.  Neurologic: Awake, alert, following commands  Skin: No rash noted  Access: Right IJ PermCath    Basic Metabolic Panel: Recent Labs  Lab 09/30/19 0600 09/30/19 0600 10/01/19 0450 10/01/19 0450 10/02/19 0503 10/02/19 0504 10/03/19 0506 10/04/19 0454  NA 142  --  142  --  139  --  140 142  K 4.4  --  4.3  --  5.1  --  4.1 4.2  CL 108  --  108  --  107  --  108 105  CO2 27  --  27  --  23  --  25 28  GLUCOSE 90  --  92  --  87  --  92 88  BUN 60*  --  66*  --  62*  --  57* 41*  CREATININE 3.17*  --  3.39*  --  3.24*  --  3.26* 2.61*  CALCIUM 8.5*   < > 8.5*   < > 8.6*  --  8.7* 9.0  PHOS  --   --   --   --   --  4.9*  --  3.7   < > = values in this interval not displayed.    Liver Function Tests: Recent Labs  Lab 10/04/19 0454  ALBUMIN 2.7*   No results for input(s): LIPASE, AMYLASE in the last 168 hours. No results for input(s): AMMONIA in the last  168 hours.  CBC: Recent Labs  Lab 09/28/19 1552  WBC 9.6  NEUTROABS 8.3*  HGB 8.5*  HCT 27.7*  MCV 97.2  PLT 191    Cardiac Enzymes: No results for input(s): CKTOTAL, CKMB, CKMBINDEX, TROPONINI in the last 168 hours.  BNP: Invalid input(s): POCBNP  CBG: No results for input(s): GLUCAP in the last 168 hours.  Microbiology: Results for orders placed or performed during the hospital encounter of 09/28/19  SARS CORONAVIRUS 2 (TAT 6-24 HRS) Nasopharyngeal Nasopharyngeal Swab     Status: None   Collection Time: 09/28/19  5:27 PM   Specimen: Nasopharyngeal Swab  Result Value Ref Range Status   SARS Coronavirus 2 NEGATIVE NEGATIVE Final    Comment: (NOTE) SARS-CoV-2 target nucleic acids are NOT DETECTED. The SARS-CoV-2 RNA is generally detectable in upper and lower respiratory specimens during the acute phase of infection. Negative results do not preclude SARS-CoV-2 infection, do not rule out co-infections with other pathogens, and should not be used as the sole basis  for treatment or other patient management decisions. Negative results must be combined with clinical observations, patient history, and epidemiological information. The expected result is Negative. Fact Sheet for Patients: SugarRoll.be Fact Sheet for Healthcare Providers: https://www.woods-mathews.com/ This test is not yet approved or cleared by the [place] FDA and  has been authorized for detection and/or diagnosis of SARS-CoV-2 by FDA under an Emergency Use Authorization (EUA). This EUA will remain  in effect (meaning this test can be used) for the duration of the COVID-19 declaration under Section 56 4(b)(1) of the Act, 21 U.S.C. section 360bbb-3(b)(1), unless the authorization is terminated or revoked sooner. Performed at [facility], [place] [address] [place], [place] [address]     Coagulation Studies: No results for input(s): LABPROT, INR in the  last 72 hours.  Urinalysis: No results for input(s): COLORURINE, LABSPEC, PHURINE, GLUCOSEU, HGBUR, BILIRUBINUR, KETONESUR, PROTEINUR, UROBILINOGEN, NITRITE, LEUKOCYTESUR in the last 72 hours.  Invalid input(s): APPERANCEUR    Imaging: No results found.   Medications:   . sodium chloride    . sodium chloride    . sodium chloride     . acidophilus  2 capsule Oral TID  . aspirin EC  81 mg Oral Daily  . bumetanide  1 mg Oral Daily  . busPIRone  5 mg Oral Daily  . calcium-vitamin D  1 tablet Oral Daily  . Chlorhexidine Gluconate Cloth  6 each Topical Q0600  . docusate sodium  100 mg Oral BID  . [START ON 10/05/2019] epoetin (EPOGEN/PROCRIT) injection  4,000 Units Intravenous Q M,W,F-HD  . ferrous gluconate  324 mg Oral Daily  . heparin  5,000 Units Subcutaneous Q8H  . hydrALAZINE  25 mg Oral Q8H  . losartan  50 mg Oral Daily  . metoprolol succinate  25 mg Oral Daily  . omega-3 acid ethyl esters  1,000 mg Oral Daily  . pantoprazole  40 mg Oral Daily  . polyethylene glycol  17 g Oral Daily  . sertraline  200 mg Oral Daily  . sodium chloride flush  3 mL Intravenous Q12H  . vitamin B-12  1,000 mcg Oral Daily   sodium chloride, sodium chloride, sodium chloride, acetaminophen, alteplase, heparin, HYDROmorphone (DILAUDID) injection, lidocaine (PF), lidocaine-prilocaine, ondansetron (ZOFRAN) IV, ondansetron (ZOFRAN) IV, pentafluoroprop-tetrafluoroeth, sodium chloride flush, traMADol  Assessment/ Plan:  85 y.o. female with past medical history of hypertension, chronic systolic heart failure, chronic kidney disease stage V baseline EGFR 13, asthma, GERD, hyperlipidemia, history of bifrontal subdural hemorrhages, dementia Admitted with acute on chronic systolic heart failure.  Ejection fraction 35 to 40%.  1.  Chronic kidney disease stage V.  Patient seen and evaluated at bedside.  Underwent dialysis treatment today.  Tolerated well.  We will plan for another dialysis session tomorrow.   Outpatient hemodialysis center placement ongoing.  2.  Acute on chronic systolic heart failure.  Continue ultrafiltration with dialysis sessions.  3.  Anemia of chronic kidney disease.  Maintain the patient on Epogen 4000 IV with dialysis treatments.  4.  Secondary hyperparathyroidism.  Phosphorus down to 3.7 today.  Continue to monitor phosphorus, calcium, and PTH periodically.   LOS: 6 Shaquasha Gerstel 4/22/20211:48 PM

## 2019-10-04 NOTE — Progress Notes (Signed)
Treatment completed.

## 2019-10-04 NOTE — Progress Notes (Signed)
PROGRESS NOTE    Misty Berry  ERX:[id number] DOB: 09/17/1934 DOA: 09/28/2019 PCP: Nelia Shi, MD   Brief Narrative:  Misty Berry a 85 y.o.femalewith medical history significant ofchronic kidney disease stage IV, anemia of chronic kidney disease,left eye blindnessosteoarthritis, GERD, chronic systolic heart failure with echo on January 2021 revealing EF of 35 to 40% presents with complaints of dyspnea on exertion that started yesterday. She is unable to tell me how much she can walk before she becomes short of breath because she has not been paying attention. Denies orthopnea. Denies chest pain or chest pressure.   Subjective: Patient was feeling better when seen today.  She was undergoing second full session of dialysis with no complaints.  Assessment & Plan:   Active Problems:   CHF (congestive heart failure) (HCC)   Hypertensive heart and renal disease   Palliative care by specialist   Advanced care planning/counseling discussion   Acute on chronic diastolic heart failure (HCC)   Swelling  Acute on chronic systolic heart failure.  Appears euvolemic now after starting dialysis.  EF of 35 to 40% -Continue Bumex. -Continue beta-blocker. -Daily weight and strict ins and outs.  Stage V CKD now ESRD.  Tunneled catheter was placed and she was started on dialysis.  Today was her second day of dialysis. -Waiting for clip procedure to be completed. -PT is recommending SNF placement-had [facility] bed [facility]  Hypertension.  Blood pressure within goal. -Continue home dose of beta-blocker. -Continue hydralazine 25 mg 3 times a day.  GERD. -Continue with Protonix  Anemia of chronic kidney disease.  Hemoglobin stable. -Continue to monitor.  Mild cellulitis of chest.  Completed the course of Keflex.  Depression. -Continue home meds.  Objective: Vitals:   10/04/19 1215 10/04/19 1230 10/04/19 1245 10/04/19 1324  BP: (!) 117/45 (!) 117/50  139/70 128/67  Pulse: 69 73  71  Resp: 18 17  18   Temp:  (!) 96.8 F (36 C)    TempSrc:  Oral    SpO2: 99% (!) 68%  (!) 84%  Weight:      Height:        Intake/Output Summary (Last 24 hours) at 10/04/2019 1627 Last data filed at 10/04/2019 1416 Gross per 24 hour  Intake 220 ml  Output 2600 ml  Net -2380 ml   Filed Weights   10/02/19 0808 10/02/19 1410 10/04/19 0534  Weight: 93 kg 95 kg 88.1 kg    Examination:  General exam: Appears calm and comfortable  Respiratory system: Clear to auscultation. Respiratory effort normal. Cardiovascular system: S1 & S2 heard, RRR. No JVD, murmurs, rubs, gallops or clicks. Gastrointestinal system: Soft, nontender, nondistended, bowel sounds positive. Central nervous system: Alert and oriented. No focal neurological deficits. Extremities: 2+ LE edema, right with Unna boot, no cyanosis, pulses intact and symmetrical. Psychiatry: Judgement and insight appear normal. Mood & affect appropriate.    DVT prophylaxis: Heparin Code Status: DNR Family Communication: Discussed with patient.  Disposition Plan:  Status is: Inpatient  Remains inpatient appropriate because:Inpatient level of care appropriate due to severity of illness   Dispo: The patient is from: Home              Anticipated d/c is to: SNF              Anticipated d/c date is: 2 days              Patient currently is not medically stable to d/c.  Patient had  a bed offer at [facility].  Waiting for clip procedure to be completed and will be discharged once outpatient dialysis chair is available.  Consultants:   Nephrology  Vascular surgery  Palliative care  Procedures:  Antimicrobials:   Data Reviewed: I have personally reviewed following labs and imaging studies  CBC: Recent Labs  Lab 09/28/19 1552  WBC 9.6  NEUTROABS 8.3*  HGB 8.5*  HCT 27.7*  MCV 97.2  PLT 270   Basic Metabolic Panel: Recent Labs  Lab 09/30/19 0600 10/01/19 0450 10/02/19 0503 10/02/19  0504 10/03/19 0506 10/04/19 0454  NA 142 142 139  --  140 142  K 4.4 4.3 5.1  --  4.1 4.2  CL 108 108 107  --  108 105  CO2 27 27 23   --  25 28  GLUCOSE 90 92 87  --  92 88  BUN 60* 66* 62*  --  57* 41*  CREATININE 3.17* 3.39* 3.24*  --  3.26* 2.61*  CALCIUM 8.5* 8.5* 8.6*  --  8.7* 9.0  PHOS  --   --   --  4.9*  --  3.7   GFR: Estimated Creatinine Clearance: 18.6 mL/min (A) (by C-G formula based on SCr of 2.61 mg/dL (H)). Liver Function Tests: Recent Labs  Lab 10/04/19 0454  ALBUMIN 2.7*   No results for input(s): LIPASE, AMYLASE in the last 168 hours. No results for input(s): AMMONIA in the last 168 hours. Coagulation Profile: No results for input(s): INR, PROTIME in the last 168 hours. Cardiac Enzymes: No results for input(s): CKTOTAL, CKMB, CKMBINDEX, TROPONINI in the last 168 hours. BNP (last 3 results) No results for input(s): PROBNP in the last 8760 hours. HbA1C: No results for input(s): HGBA1C in the last 72 hours. CBG: No results for input(s): GLUCAP in the last 168 hours. Lipid Profile: No results for input(s): CHOL, HDL, LDLCALC, TRIG, CHOLHDL, LDLDIRECT in the last 72 hours. Thyroid Function Tests: No results for input(s): TSH, T4TOTAL, FREET4, T3FREE, THYROIDAB in the last 72 hours. Anemia Panel: No results for input(s): VITAMINB12, FOLATE, FERRITIN, TIBC, IRON, RETICCTPCT in the last 72 hours. Sepsis Labs: No results for input(s): PROCALCITON, LATICACIDVEN in the last 168 hours.  Recent Results (from the past 240 hour(s))  SARS CORONAVIRUS 2 (TAT 6-24 HRS) Nasopharyngeal Nasopharyngeal Swab     Status: None   Collection Time: 09/28/19  5:27 PM   Specimen: Nasopharyngeal Swab  Result Value Ref Range Status   SARS Coronavirus 2 NEGATIVE NEGATIVE Final    Comment: (NOTE) SARS-CoV-2 target nucleic acids are NOT DETECTED. The SARS-CoV-2 RNA is generally detectable in upper and lower respiratory specimens during the acute phase of infection. Negative  results do not preclude SARS-CoV-2 infection, do not rule out co-infections with other pathogens, and should not be used as the sole basis for treatment or other patient management decisions. Negative results must be combined with clinical observations, patient history, and epidemiological information. The expected result is Negative. Fact Sheet for Patients: SugarRoll.be Fact Sheet for Healthcare Providers: https://www.woods-mathews.com/ This test is not yet approved or cleared by the [place] FDA and  has been authorized for detection and/or diagnosis of SARS-CoV-2 by FDA under an Emergency Use Authorization (EUA). This EUA will remain  in effect (meaning this test can be used) for the duration of the COVID-19 declaration under Section 56 4(b)(1) of the Act, 21 U.S.C. section 360bbb-3(b)(1), unless the authorization is terminated or revoked sooner. Performed at [facility], [place] [address]  [place], [place] [address]      Radiology Studies: No results found.  Scheduled Meds: . acidophilus  2 capsule Oral TID  . aspirin EC  81 mg Oral Daily  . bumetanide  1 mg Oral Daily  . busPIRone  5 mg Oral Daily  . calcium-vitamin D  1 tablet Oral Daily  . Chlorhexidine Gluconate Cloth  6 each Topical Q0600  . docusate sodium  100 mg Oral BID  . [START ON 10/05/2019] epoetin (EPOGEN/PROCRIT) injection  4,000 Units Intravenous Q M,W,F-HD  . ferrous gluconate  324 mg Oral Daily  . heparin  5,000 Units Subcutaneous Q8H  . hydrALAZINE  25 mg Oral Q8H  . losartan  50 mg Oral Daily  . metoprolol succinate  25 mg Oral Daily  . omega-3 acid ethyl esters  1,000 mg Oral Daily  . pantoprazole  40 mg Oral Daily  . polyethylene glycol  17 g Oral Daily  . sertraline  200 mg Oral Daily  . sodium chloride flush  3 mL Intravenous Q12H  . vitamin B-12  1,000 mcg Oral Daily   Continuous Infusions: . sodium chloride    . sodium chloride    . sodium  chloride       LOS: 6 days   Time spent: 35 minutes.  Lorella Nimrod, MD [facility]  If 7PM-7AM, please contact night-coverage [email or web address]  10/04/2019, 4:27 PM   This record has been created using Systems analyst. Errors have been sought and corrected,but may not always be located. Such creation errors do not reflect on the standard of care.

## 2019-10-04 NOTE — Care Management Important Message (Signed)
Important Message  Patient Details  Name: Misty Berry MRN: [id number] Date of Birth: 1935/03/09   Medicare Important Message Given:  Yes     [name] 10/04/2019, 1:05 PM

## 2019-10-04 NOTE — Progress Notes (Signed)
Pt had small, soft BM. Pt is pleasantly confused.

## 2019-10-04 NOTE — Plan of Care (Signed)
  Problem: Education: Goal: Ability to demonstrate management of disease process will improve Outcome: Progressing Goal: Ability to verbalize understanding of medication therapies will improve Outcome: Progressing Goal: Individualized Educational Video(s) Outcome: Progressing   Problem: Activity: Goal: Capacity to carry out activities will improve Outcome: Progressing   Problem: Cardiac: Goal: Ability to achieve and maintain adequate cardiopulmonary perfusion will improve Outcome: Progressing   Problem: Clinical Measurements: Goal: Will remain free from infection Outcome: Progressing Goal: Diagnostic test results will improve Outcome: Progressing Goal: Respiratory complications will improve Outcome: Progressing Goal: Cardiovascular complication will be avoided Outcome: Progressing   Problem: Safety: Goal: Ability to remain free from injury will improve Outcome: Progressing

## 2019-10-04 NOTE — Progress Notes (Signed)
Pt is alert and oriented to self and place.

## 2019-10-04 NOTE — Progress Notes (Signed)
Physical Therapy Treatment Patient Details Name: Misty Berry MRN: [id number] DOB: 1935/02/26 Today's Date: 10/04/2019    History of Present Illness Misty Berry is an 85 y.o. female with medical history significant of chronic kidney disease stage IV, anemia of chronic kidney disease, left eye blindness, osteoarthritis, GERD, chronic systolic heart failure with echo on January 2021 revealing EF of 35 to 40% presents with complaints of dyspnea on exertion.  Reports that she was able to be up in the home for 30+ minutes at a time last month.  Denies orthopnea.  Denies chest pain or chest pressure.      PT Comments    Pt laying in bed c/o R hip/sacral pain; pillow under R leg and hip to assist with weight shifts.  She reports she is tired from dialysis, but acknowledges that she needs to be doing something.  Overall she showed good effort with supine LE exercises, some c/o pain in the R with palpation/movement, more OA knee limited on the L.  Pt defers sitting/standing this date saying the exercises wiped her out.  Will maintain on PT caseload and advance mobility, etc as able.     Follow Up Recommendations  SNF     Equipment Recommendations  Rolling walker with 5" wheels    Recommendations for Other Services       Precautions / Restrictions Precautions Precautions: None Restrictions Weight Bearing Restrictions: No    Mobility  Bed Mobility               General bed mobility comments: After supine exercises pt reports being too tired to try getting sitting much elss standing.  Transfers                    Ambulation/Gait                 Stairs             Wheelchair Mobility    Modified Rankin (Stroke Patients Only)       Balance                                            Cognition Arousal/Alertness: Awake/alert Behavior During Therapy: WFL for tasks assessed/performed Overall Cognitive Status: Within Functional  Limits for tasks assessed                                        Exercises General Exercises - Lower Extremity Ankle Circles/Pumps: AROM;10 reps;Both Short Arc Quad: AROM;Strengthening;10 reps;Both Heel Slides: AROM;10 reps;Both(b/l knee OA, L>R, resisted leg ext b/l) Hip ABduction/ADduction: Strengthening;AROM;10 reps;Both Straight Leg Raises: AROM;AAROM;10 reps;Both    General Comments        Pertinent Vitals/Pain Pain Assessment: (c/o general pain in R LE, as well as hip/sacral pain on R)    Home Living                      Prior Function            PT Goals (current goals can now be found in the care plan section) Progress towards PT goals: Progressing toward goals    Frequency    Min 2X/week      PT Plan Current plan remains appropriate    Co-evaluation  AM-PAC PT "6 Clicks" Mobility   Outcome Measure  Help needed turning from your back to your side while in a flat bed without using bedrails?: A Lot Help needed moving from lying on your back to sitting on the side of a flat bed without using bedrails?: A Lot Help needed moving to and from a bed to a chair (including a wheelchair)?: A Lot Help needed standing up from a chair using your arms (e.g., wheelchair or bedside chair)?: A Lot Help needed to walk in hospital room?: A Lot Help needed climbing 3-5 steps with a railing? : Total 6 Click Score: 11    End of Session Equipment Utilized During Treatment: Gait belt Activity Tolerance: Patient limited by fatigue Patient left: in bed;with call bell/phone within reach;with bed alarm set;with nursing/sitter in room Nurse Communication: Mobility status PT Visit Diagnosis: Muscle weakness (generalized) (M62.81);Difficulty in walking, not elsewhere classified (R26.2)     Time: 9163-8466 PT Time Calculation (min) (ACUTE ONLY): 25 min  Charges:  $Therapeutic Exercise: 23-37 mins                     Kreg Shropshire,  DPT 10/04/2019, 4:50 PM

## 2019-10-04 NOTE — Progress Notes (Signed)
BP rechecked, not 105/50, P 71.

## 2019-10-04 NOTE — Progress Notes (Signed)
HD treatment initiated.

## 2019-10-05 NOTE — Progress Notes (Signed)
Provided patient with "Living Better with Heart Failure" packet.   Briefly reviewed definition of heart failure and signs and symptoms of an exacerbation.   Reviewed importance of and reason behind checking weight daily in the AM, after using the bathroom, but before getting dressed.  Discussed when to call the Dr= weight gain of >2lb overnight of 5lb in a week,  Discussed yellow zone= call MD: weight gain of >2lb overnight of 5lb in a week, increased swelling, increased SOB when lying down, chest discomfort, dizziness, increased fatigue  Red Zone= call 911: struggle to breath, fainting or near fainting, significant chest pain  Reviewed low sodium diet <2g/day-provided handout of recommended and not recommended foods  Fluid restriction <2L/day   Heart Failure Medication Review EF 35-40% Date 06/27/19  Medication Class Previous/Home Med Discharge/Changes  ACEI/ARB/ARNI --- Losartan 50mg   BB --- Metoprolol Succ 25mg  qd  ARA --- ---  Loop Torsemide 40mg  Bumetanide 1mg  qd  Other --- Hydralazine 25mg  tid     Explained briefly why pt is on the medications (either make you feel better, live longer or keep you out of the hospital)  Lu Duffel, PharmD, BCPS Clinical Pharmacist 10/05/2019 3:31 PM

## 2019-10-05 NOTE — Plan of Care (Signed)
  Problem: Education: Goal: Ability to demonstrate management of disease process will improve Outcome: Progressing Goal: Ability to verbalize understanding of medication therapies will improve Outcome: Progressing Goal: Individualized Educational Video(s) Outcome: Progressing   Problem: Activity: Goal: Capacity to carry out activities will improve Outcome: Progressing   Problem: Activity: Goal: Risk for activity intolerance will decrease Outcome: Progressing   Problem: Pain Managment: Goal: General experience of comfort will improve Outcome: Progressing   Problem: Safety: Goal: Ability to remain free from injury will improve Outcome: Progressing

## 2019-10-05 NOTE — Progress Notes (Signed)
Physical Therapy Treatment Patient Details Name: Misty Berry MRN: [id number] DOB: [birth year] Today's Date: 10/05/2019    History of Present Illness Misty Berry is an 85 y.o. female with medical history significant of chronic kidney disease stage IV, anemia of chronic kidney disease, left eye blindness, osteoarthritis, GERD, chronic systolic heart failure with echo on January 2021 revealing EF of 35 to 40% presents with complaints of dyspnea on exertion.  Reports that she was able to be up in the home for 30+ minutes at a time last month.  Denies orthopnea.  Denies chest pain or chest pressure.      PT Comments    Pt was supine in bed upon arriving. She is alert however disoriented to time/day/ and current situation. She is however cooperative and able to follow commands consistently. Reports pain in R glut/hip. Performed supine exercises prior to attempting OOB activity. See exercises listed below. Supine BP 152/70. She required max assist to supine <>sit with max vcs for technique and sequencing. Pt becomes SOB with RR elevation to 36 and c/o dizziness. BP dropped to 117/50. She was quickly returned to supine in bed with symptoms resolving with breathing techniques, prolong rest and BLEs elevation. Pt will benefit from SNF at DC to address strength, balance, and safe functional mobility deficits. She was in bed, with call bell in reach, bed alarm set, and quarter turned to L to decrease pressure on R hip/glut. Pt was limited this date by fatigue/RR/orthostatic hypotension with sitting up.        Follow Up Recommendations  SNF     Equipment Recommendations  Rolling walker with 5" wheels    Recommendations for Other Services       Precautions / Restrictions Precautions Precautions: None Restrictions Weight Bearing Restrictions: No    Mobility  Bed Mobility Overal bed mobility: Needs Assistance Bed Mobility: Supine to Sit;Sit to Supine     Supine to sit: Max assist Sit to  supine: Max assist   General bed mobility comments: Pt required increased time and max assist to safely progress from supine to sitting. only able to tolerate sitting ~ 1 minutes priorto needing to return to supine 2/2 to dizziness/SOB  Transfers                 General transfer comment: unsafe to trial this date  Ambulation/Gait                 Stairs             Wheelchair Mobility    Modified Rankin (Stroke Patients Only)       Balance Overall balance assessment: Needs assistance Sitting-balance support: Feet supported;Bilateral upper extremity supported Sitting balance-Leahy Scale: Poor Sitting balance - Comments: pt required constant min assist to maintain balance while seated EOB.                                    Cognition Arousal/Alertness: Awake/alert Behavior During Therapy: WFL for tasks assessed/performed Overall Cognitive Status: History of cognitive impairments - at baseline                                 General Comments: pt has baseline cognition deficits. Increased time to process. Alert throughout but disoriented to time/day/month/situation      Exercises General Exercises - Lower Extremity Ankle Circles/Pumps: AROM;20  reps Quad Sets: AROM;Both;10 reps Gluteal Sets: AROM;10 reps Heel Slides: AAROM;10 reps Hip ABduction/ADduction: AAROM;10 reps Straight Leg Raises: AAROM;10 reps    General Comments        Pertinent Vitals/Pain Pain Assessment: 0-10 Pain Score: 4  Pain Location: R glut Pain Descriptors / Indicators: Burning;Discomfort;Nagging;Pressure Pain Intervention(s): Limited activity within patient's tolerance;Monitored during session;Repositioned    Home Living                      Prior Function            PT Goals (current goals can now be found in the care plan section) Acute Rehab PT Goals Patient Stated Goal: " I want to get stronger" Progress towards PT goals:  Progressing toward goals    Frequency    Min 2X/week      PT Plan Current plan remains appropriate    Co-evaluation              AM-PAC PT "6 Clicks" Mobility   Outcome Measure  Help needed turning from your back to your side while in a flat bed without using bedrails?: A Lot Help needed moving from lying on your back to sitting on the side of a flat bed without using bedrails?: A Lot Help needed moving to and from a bed to a chair (including a wheelchair)?: A Lot Help needed standing up from a chair using your arms (e.g., wheelchair or bedside chair)?: A Lot Help needed to walk in hospital room?: A Lot Help needed climbing 3-5 steps with a railing? : Total 6 Click Score: 11    End of Session   Activity Tolerance: Patient limited by fatigue Patient left: in bed;with call bell/phone within reach;with bed alarm set Nurse Communication: Mobility status PT Visit Diagnosis: Muscle weakness (generalized) (M62.81);Difficulty in walking, not elsewhere classified (R26.2)     Time: 3794-3276 PT Time Calculation (min) (ACUTE ONLY): 23 min  Charges:  $Therapeutic Exercise: 8-22 mins $Therapeutic Activity: 8-22 mins                     Julaine Fusi PTA 10/05/19, 2:45 PM

## 2019-10-05 NOTE — Progress Notes (Signed)
Patient has been accepted at [facility] 12:15. Patient can start on Tuesday 4/27. Per brother there was concern about patient going to dialysis unattended considering memory issues. I spoke with Lyman Speller [phone] from [place] about this. Larena Glassman stated that she was in the process of working on this and that she will contact brother and Geophysicist/field seismologist to work out plan. Larena Glassman is also aware of patient schedule.

## 2019-10-05 NOTE — Progress Notes (Signed)
Nasopharyngeal specimen collected and tube'd to lab

## 2019-10-05 NOTE — Progress Notes (Signed)
[facility]  ROUNDING NOTE   Subjective:  Patient due for dialysis treatment today again. Outpatient hemodialysis seat has been secured. No acute distress at the moment.   Objective:  Vital signs in last 24 hours:  Temp:  [97.7 F (36.5 C)] 97.7 F (36.5 C) (04/23 0937) Pulse Rate:  [74-78] 77 (04/23 0937) Resp:  [16-18] 18 (04/23 0937) BP: (139-159)/(54-67) 147/54 (04/23 0937) SpO2:  [96 %-100 %] 100 % (04/23 0937)  Weight change:  Filed Weights   10/02/19 0808 10/02/19 1410 10/04/19 0534  Weight: 93 kg 95 kg 88.1 kg    Intake/Output: I/O last 3 completed shifts: In: 340 [P.O.:340] Out: 1700 [Urine:1200; Other:500]   Intake/Output this shift:  Total I/O In: 360 [P.O.:360] Out: -   Physical Exam: General: No acute distress  Head: Normocephalic, atraumatic. Moist oral mucosal membranes  Eyes: Anicteric  Neck: Supple, trachea midline  Lungs:  Clear to auscultation, normal effort  Heart: S1S2 no rubs  Abdomen:  Soft, nontender, bowel sounds present  Extremities: 2+ peripheral edema.  Neurologic: Lethargic today but arousable  Skin: No rash noted  Access: Right IJ PermCath    Basic Metabolic Panel: Recent Labs  Lab 09/30/19 0600 09/30/19 0600 10/01/19 0450 10/01/19 0450 10/02/19 0503 10/02/19 0504 10/03/19 0506 10/04/19 0454  NA 142  --  142  --  139  --  140 142  K 4.4  --  4.3  --  5.1  --  4.1 4.2  CL 108  --  108  --  107  --  108 105  CO2 27  --  27  --  23  --  25 28  GLUCOSE 90  --  92  --  87  --  92 88  BUN 60*  --  66*  --  62*  --  57* 41*  CREATININE 3.17*  --  3.39*  --  3.24*  --  3.26* 2.61*  CALCIUM 8.5*   < > 8.5*   < > 8.6*  --  8.7* 9.0  PHOS  --   --   --   --   --  4.9*  --  3.7   < > = values in this interval not displayed.    Liver Function Tests: Recent Labs  Lab 10/04/19 0454  ALBUMIN 2.7*   No results for input(s): LIPASE, AMYLASE in the last 168 hours. No results for input(s): AMMONIA in the last 168  hours.  CBC: Recent Labs  Lab 09/28/19 1552  WBC 9.6  NEUTROABS 8.3*  HGB 8.5*  HCT 27.7*  MCV 97.2  PLT 191    Cardiac Enzymes: No results for input(s): CKTOTAL, CKMB, CKMBINDEX, TROPONINI in the last 168 hours.  BNP: Invalid input(s): POCBNP  CBG: No results for input(s): GLUCAP in the last 168 hours.  Microbiology: Results for orders placed or performed during the hospital encounter of 09/28/19  SARS CORONAVIRUS 2 (TAT 6-24 HRS) Nasopharyngeal Nasopharyngeal Swab     Status: None   Collection Time: 09/28/19  5:27 PM   Specimen: Nasopharyngeal Swab  Result Value Ref Range Status   SARS Coronavirus 2 NEGATIVE NEGATIVE Final    Comment: (NOTE) SARS-CoV-2 target nucleic acids are NOT DETECTED. The SARS-CoV-2 RNA is generally detectable in upper and lower respiratory specimens during the acute phase of infection. Negative results do not preclude SARS-CoV-2 infection, do not rule out co-infections with other pathogens, and should not be used as the sole basis for treatment or other  patient management decisions. Negative results must be combined with clinical observations, patient history, and epidemiological information. The expected result is Negative. Fact Sheet for Patients: SugarRoll.be Fact Sheet for Healthcare Providers: https://www.woods-mathews.com/ This test is not yet approved or cleared by the [place] FDA and  has been authorized for detection and/or diagnosis of SARS-CoV-2 by FDA under an Emergency Use Authorization (EUA). This EUA will remain  in effect (meaning this test can be used) for the duration of the COVID-19 declaration under Section 56 4(b)(1) of the Act, 21 U.S.C. section 360bbb-3(b)(1), unless the authorization is terminated or revoked sooner. Performed at [facility], [address] [place], [place] [address]     Coagulation Studies: No results for input(s): LABPROT, INR in the last  72 hours.  Urinalysis: No results for input(s): COLORURINE, LABSPEC, PHURINE, GLUCOSEU, HGBUR, BILIRUBINUR, KETONESUR, PROTEINUR, UROBILINOGEN, NITRITE, LEUKOCYTESUR in the last 72 hours.  Invalid input(s): APPERANCEUR    Imaging: No results found.   Medications:   . sodium chloride    . sodium chloride    . sodium chloride     . acidophilus  2 capsule Oral TID  . aspirin EC  81 mg Oral Daily  . bumetanide  1 mg Oral Daily  . busPIRone  5 mg Oral Daily  . calcium-vitamin D  1 tablet Oral Daily  . Chlorhexidine Gluconate Cloth  6 each Topical Q0600  . docusate sodium  100 mg Oral BID  . epoetin (EPOGEN/PROCRIT) injection  4,000 Units Intravenous Q M,W,F-HD  . ferrous gluconate  324 mg Oral Daily  . heparin  5,000 Units Subcutaneous Q8H  . hydrALAZINE  25 mg Oral Q8H  . losartan  50 mg Oral Daily  . metoprolol succinate  25 mg Oral Daily  . omega-3 acid ethyl esters  1,000 mg Oral Daily  . pantoprazole  40 mg Oral Daily  . polyethylene glycol  17 g Oral Daily  . sertraline  200 mg Oral Daily  . sodium chloride flush  3 mL Intravenous Q12H  . vitamin B-12  1,000 mcg Oral Daily   sodium chloride, sodium chloride, sodium chloride, acetaminophen, alteplase, heparin, HYDROmorphone (DILAUDID) injection, lidocaine (PF), lidocaine-prilocaine, ondansetron (ZOFRAN) IV, ondansetron (ZOFRAN) IV, pentafluoroprop-tetrafluoroeth, sodium chloride flush, traMADol  Assessment/ Plan:  85 y.o. female with past medical history of hypertension, chronic systolic heart failure, chronic kidney disease stage V baseline EGFR 13, asthma, GERD, hyperlipidemia, history of bifrontal subdural hemorrhages, dementia Admitted with acute on chronic systolic heart failure.  Ejection fraction 35 to 40%.  1.  Chronic kidney disease stage V.  Patient due for another dialysis session today.  Her outpatient hemodialysis seat has been secured at [facility].  Okay for discharge after dialysis  tomorrow.  2.  Acute on chronic systolic heart failure.  Should continue to improve with ongoing ultrafiltration with dialysis sessions..  3.  Anemia of chronic kidney disease.  Maintain the patient on Epogen 4000 IV with dialysis treatments.  4.  Secondary hyperparathyroidism.  Most recent serum phosphorus was down to 3.7.  Continue to monitor bone mineral metabolism parameters.   LOS: 7 Marticia Reifschneider 4/23/20212:11 PM

## 2019-10-05 NOTE — Progress Notes (Signed)
PROGRESS NOTE    Misty Berry  ZOX:[id number] DOB: May 20, 1935 DOA: 09/28/2019 PCP: Nelia Shi, MD   Brief Narrative:  Misty Berry Winteris a 85 y.o.femalewith medical history significant ofchronic kidney disease stage IV, anemia of chronic kidney disease,left eye blindnessosteoarthritis, GERD, chronic systolic heart failure with echo on January 2021 revealing EF of 35 to 40% presents with complaints of dyspnea on exertion that started yesterday. She is unable to tell me how much she can walk before she becomes short of breath because she has not been paying attention. Denies orthopnea. Denies chest pain or chest pressure.   Subjective: Patient has no new complaints today.    Assessment & Plan:   Active Problems:   CHF (congestive heart failure) (HCC)   Hypertensive heart and renal disease   Palliative care by specialist   Advanced care planning/counseling discussion   Acute on chronic diastolic heart failure (HCC)   Swelling  Acute on chronic systolic heart failure.  Appears euvolemic now after starting dialysis.  EF of 35 to 40% -Continue Bumex. -Continue beta-blocker. -Daily weight and strict ins and outs.  Stage V CKD now ESRD.  Tunneled catheter was placed and she was started on dialysis.  Today was her second day of dialysis. -Patient has a chair of her for outpatient dialysis starting from Tuesday-she should be able to leave tomorrow after dialysis and then continue from Tuesday. -PT is recommending SNF placement-had a bed offer at [facility].  Hypertension.  Blood pressure within goal. -Continue home dose of beta-blocker. -Continue hydralazine 25 mg 3 times a day.  GERD. -Continue with Protonix  Anemia of chronic kidney disease.  Hemoglobin stable. -Continue to monitor.  Mild cellulitis of chest.  Completed the course of Keflex.  Depression. -Continue home meds.  Objective: Vitals:   10/05/19 0008 10/05/19 0554 10/05/19 0937 10/05/19  1415  BP: (!) 139/57 (!) 159/67 (!) 147/54 (!) 163/97  Pulse: 74 76 77 77  Resp: 16  18 (!) 22  Temp: 97.7 F (36.5 C)  97.7 F (36.5 C) 97.6 F (36.4 C)  TempSrc: Oral  Oral Oral  SpO2: 96%  100% 99%  Weight:    88.1 kg  Height:        Intake/Output Summary (Last 24 hours) at 10/05/2019 1619 Last data filed at 10/05/2019 1410 Gross per 24 hour  Intake 480 ml  Output 400 ml  Net 80 ml   Filed Weights   10/02/19 1410 10/04/19 0534 10/05/19 1415  Weight: 95 kg 88.1 kg 88.1 kg    Examination:  General exam: Appears calm and comfortable  Respiratory system: Clear to auscultation. Respiratory effort normal. Cardiovascular system: S1 & S2 heard, RRR. No JVD, murmurs, rubs, gallops or clicks. Gastrointestinal system: Soft, nontender, nondistended, bowel sounds positive. Central nervous system: Alert and oriented. No focal neurological deficits. Extremities: 2+ LE edema, right with Unna boot, no cyanosis, pulses intact and symmetrical. Psychiatry: Judgement and insight appear normal. Mood & affect appropriate.    DVT prophylaxis: Heparin Code Status: DNR Family Communication: Discussed with patient.  Disposition Plan:  Status is: Inpatient  Remains inpatient appropriate because:Inpatient level of care appropriate due to severity of illness   Dispo: The patient is from: Home              Anticipated d/c is to: SNF              Anticipated d/c date is: 1 day  Patient currently is not medically stable to d/c.  Patient had a bed offer at [facility].  Patient had a outpatient dialysis chair of her starting from Tuesday so she will do her Saturday dialysis tomorrow here before leaving.  Consultants:   Nephrology  Vascular surgery  Palliative care  Procedures:  Antimicrobials:   Data Reviewed: I have personally reviewed following labs and imaging studies  CBC: No results for input(s): WBC, NEUTROABS, HGB, HCT, MCV, PLT in the last 168 hours. Basic Metabolic  Panel: Recent Labs  Lab 09/30/19 0600 10/01/19 0450 10/02/19 0503 10/02/19 0504 10/03/19 0506 10/04/19 0454  NA 142 142 139  --  140 142  K 4.4 4.3 5.1  --  4.1 4.2  CL 108 108 107  --  108 105  CO2 27 27 23   --  25 28  GLUCOSE 90 92 87  --  92 88  BUN 60* 66* 62*  --  57* 41*  CREATININE 3.17* 3.39* 3.24*  --  3.26* 2.61*  CALCIUM 8.5* 8.5* 8.6*  --  8.7* 9.0  PHOS  --   --   --  4.9*  --  3.7   GFR: Estimated Creatinine Clearance: 18.6 mL/min (A) (by C-G formula based on SCr of 2.61 mg/dL (H)). Liver Function Tests: Recent Labs  Lab 10/04/19 0454  ALBUMIN 2.7*   No results for input(s): LIPASE, AMYLASE in the last 168 hours. No results for input(s): AMMONIA in the last 168 hours. Coagulation Profile: No results for input(s): INR, PROTIME in the last 168 hours. Cardiac Enzymes: No results for input(s): CKTOTAL, CKMB, CKMBINDEX, TROPONINI in the last 168 hours. BNP (last 3 results) No results for input(s): PROBNP in the last 8760 hours. HbA1C: No results for input(s): HGBA1C in the last 72 hours. CBG: No results for input(s): GLUCAP in the last 168 hours. Lipid Profile: No results for input(s): CHOL, HDL, LDLCALC, TRIG, CHOLHDL, LDLDIRECT in the last 72 hours. Thyroid Function Tests: No results for input(s): TSH, T4TOTAL, FREET4, T3FREE, THYROIDAB in the last 72 hours. Anemia Panel: No results for input(s): VITAMINB12, FOLATE, FERRITIN, TIBC, IRON, RETICCTPCT in the last 72 hours. Sepsis Labs: No results for input(s): PROCALCITON, LATICACIDVEN in the last 168 hours.  Recent Results (from the past 240 hour(s))  SARS CORONAVIRUS 2 (TAT 6-24 HRS) Nasopharyngeal Nasopharyngeal Swab     Status: None   Collection Time: 09/28/19  5:27 PM   Specimen: Nasopharyngeal Swab  Result Value Ref Range Status   SARS Coronavirus 2 NEGATIVE NEGATIVE Final    Comment: (NOTE) SARS-CoV-2 target nucleic acids are NOT DETECTED. The SARS-CoV-2 RNA is generally detectable in upper and  lower respiratory specimens during the acute phase of infection. Negative results do not preclude SARS-CoV-2 infection, do not rule out co-infections with other pathogens, and should not be used as the sole basis for treatment or other patient management decisions. Negative results must be combined with clinical observations, patient history, and epidemiological information. The expected result is Negative. Fact Sheet for Patients: SugarRoll.be Fact Sheet for Healthcare Providers: https://www.woods-mathews.com/ This test is not yet approved or cleared by the [place] FDA and  has been authorized for detection and/or diagnosis of SARS-CoV-2 by FDA under an Emergency Use Authorization (EUA). This EUA will remain  in effect (meaning this test can be used) for the duration of the COVID-19 declaration under Section 56 4(b)(1) of the Act, 21 U.S.C. section 360bbb-3(b)(1), unless the authorization is terminated or revoked sooner. Performed at [facility], [address] [place],  [address]      Radiology Studies: No results found.  Scheduled Meds: . acidophilus  2 capsule Oral TID  . aspirin EC  81 mg Oral Daily  . bumetanide  1 mg Oral Daily  . busPIRone  5 mg Oral Daily  . calcium-vitamin D  1 tablet Oral Daily  . Chlorhexidine Gluconate Cloth  6 each Topical Q0600  . docusate sodium  100 mg Oral BID  . epoetin (EPOGEN/PROCRIT) injection  4,000 Units Intravenous Q M,W,F-HD  . ferrous gluconate  324 mg Oral Daily  . heparin  5,000 Units Subcutaneous Q8H  . hydrALAZINE  25 mg Oral Q8H  . losartan  50 mg Oral Daily  . metoprolol succinate  25 mg Oral Daily  . omega-3 acid ethyl esters  1,000 mg Oral Daily  . pantoprazole  40 mg Oral Daily  . polyethylene glycol  17 g Oral Daily  . sertraline  200 mg Oral Daily  . sodium chloride flush  3 mL Intravenous Q12H  . vitamin B-12  1,000 mcg Oral Daily   Continuous  Infusions: . sodium chloride    . sodium chloride    . sodium chloride       LOS: 7 days   Time spent: 35 minutes.  Lorella Nimrod, MD [facility]  If 7PM-7AM, please contact night-coverage [email or web address]  10/05/2019, 4:19 PM   This record has been created using Systems analyst. Errors have been sought and corrected,but may not always be located. Such creation errors do not reflect on the standard of care.

## 2019-10-06 LAB — CBC
HCT: 25.8 % — ABNORMAL LOW (ref 36.0–46.0)
Hemoglobin: 8.3 g/dL — ABNORMAL LOW (ref 12.0–15.0)
MCH: 28.8 pg (ref 26.0–34.0)
MCHC: 32.2 g/dL (ref 30.0–36.0)
MCV: 89.6 fL (ref 80.0–100.0)
Platelets: 208 10*3/uL (ref 150–400)
RBC: 2.88 MIL/uL — ABNORMAL LOW (ref 3.87–5.11)
RDW: 16.4 % — ABNORMAL HIGH (ref 11.5–15.5)
WBC: 6.4 10*3/uL (ref 4.0–10.5)
nRBC: 0 % (ref 0.0–0.2)

## 2019-10-06 LAB — SARS CORONAVIRUS 2 (TAT 6-24 HRS): SARS Coronavirus 2: NEGATIVE

## 2019-10-06 MED ORDER — RISAQUAD PO CAPS: 2.0000 | ORAL_CAPSULE | Freq: Three times a day (TID) | ORAL | Status: AC

## 2019-10-06 MED ORDER — METOPROLOL SUCCINATE ER 25 MG PO TB24: 25.0000 mg | ORAL_TABLET | Freq: Every day | ORAL | Status: DC

## 2019-10-06 MED ORDER — LOSARTAN POTASSIUM 50 MG PO TABS: 50.0000 mg | ORAL_TABLET | Freq: Every day | ORAL | Status: AC

## 2019-10-06 MED ORDER — BUMETANIDE 1 MG PO TABS: 1.0000 mg | ORAL_TABLET | Freq: Every day | ORAL | Status: AC

## 2019-10-06 MED ORDER — HYDRALAZINE HCL 25 MG PO TABS: 25.0000 mg | ORAL_TABLET | Freq: Three times a day (TID) | ORAL | Status: DC

## 2019-10-06 MED ORDER — VITAMIN B-12 1000 MCG PO TABS: 1000.0000 ug | ORAL_TABLET | Freq: Every day | ORAL | Status: DC

## 2019-10-06 NOTE — Discharge Summary (Signed)
Physician Discharge Summary  IAN CAVEY FUX:[id number] DOB: 12/16/1934 DOA: 09/28/2019  PCP: Nelia Shi, MD  Admit date: 09/28/2019 Discharge date: 10/06/2019  Admitted From: Home Disposition: SNF  Recommendations for Outpatient Follow-up:  1. Follow up with PCP in 1-2 weeks 2. Follow-up with nephrology 3. Follow-up with hemodialysis on Tuesday, Thursday and Saturday. 4. Please obtain BMP/CBC in one week 5. Please follow up on the following pending results: None  Home Health: No Equipment/Devices: None Discharge Condition: Fair CODE STATUS:  Diet recommendation: Heart Healthy / Carb Modified / Regular / Dysphagia   Brief/Interim Summary: Misty Berry a 85 y.o.femalewith medical history significant ofchronic kidney disease stage IV, anemia of chronic kidney disease,left eye blindnessosteoarthritis, GERD, chronic systolic heart failure with echo on January 2021 revealing EF of 35 to 40% presents with complaints of dyspnea on exertion that started yesterday. She is unable to tell me how much she can walk before she becomes short of breath because she has not been paying attention. Denies orthopnea. Denies chest pain or chest pressure.  She was volume up on admission.  EF of 35 to 40%.  Also found to have worsening renal function and started on dialysis with a tunneled catheter.  Patient tolerated 4 sessions of dialysis before discharge.  Heart failure symptoms improved.  Patient will also referred for outpatient palliative care services. Due to her generalized weakness PT evaluation was done and they were recommending SNF placement for rehab.  She will continue with Bumex and beta-blocker along with her antihypertensives.  She has an history of anemia of chronic kidney disease, hemoglobin remained stable.  She will continue with iron supplement and EPO during dialysis.  She also had a mild cellulitis on chest on admission which was treated with Keflex,  she completed the course during hospitalization.  She will continue rest of her home meds.  Discharge Diagnoses:  Active Problems:   CHF (congestive heart failure) (HCC)   Hypertensive heart and renal disease   Palliative care by specialist   Advanced care planning/counseling discussion   Acute on chronic diastolic heart failure (HCC)   Swelling  Discharge Instructions  Discharge Instructions    Amb Referral to Cardiac Rehabilitation   Complete by: As directed    Diagnosis: Heart Failure (see criteria below if ordering Phase II)   Heart Failure Type: Chronic Systolic & Diastolic   After initial evaluation and assessments completed: Virtual Based Care may be provided alone or in conjunction with Phase 2 Cardiac Rehab based on patient barriers.: Yes   Amb Referral to HF Clinic   Complete by: As directed    Diet - low sodium heart healthy   Complete by: As directed    Increase activity slowly   Complete by: As directed      Allergies as of 10/06/2019      Reactions   Ivp Dye [iodinated Diagnostic Agents] Other (See Comments)   Lactose    Methylprednisolone Other (See Comments)   Reaction: Hallucinations and Psychosis   Peanuts [peanut Oil] Other (See Comments)   Swelling and vomitting   Vancomycin Other (See Comments)   Reaction: unknown   Wellbutrin [bupropion] Other (See Comments)   Hallucinations      Medication List    STOP taking these medications   torsemide 20 MG tablet Commonly known as: DEMADEX     TAKE these medications   acidophilus Caps capsule Take 2 capsules by mouth 3 (three) times daily.   aspirin EC 81 MG tablet  Take 81 mg by mouth daily.   BAZA PROTECT EX Apply 1 application topically See admin instructions. Apply to buttocks topically every shift after each cleaning.   bumetanide 1 MG tablet Commonly known as: BUMEX Take 1 tablet (1 mg total) by mouth daily. Start taking on: October 07, 2019   busPIRone 5 MG tablet Commonly known as:  BUSPAR   calcium-vitamin D 500-200 MG-UNIT Tabs tablet Commonly known as: OSCAL WITH D Take 1 tablet by mouth daily.   Centrum Silver 50+Women Tabs Take 1 tablet by mouth daily.   Cholecalciferol 50 MCG (2000 UT) Caps Take 2,000 Units by mouth daily.   clobetasol ointment 0.05 % Commonly known as: TEMOVATE Apply 1 application topically 2 (two) times daily. To shins   Deplin 15 15-90.314 MG Caps Take 15 mg by mouth daily.   docusate sodium 100 MG capsule Commonly known as: COLACE Take 1 capsule (100 mg total) by mouth 2 (two) times daily.   esomeprazole 40 MG capsule Commonly known as: NEXIUM Take 40 mg by mouth daily.   ferrous gluconate 324 MG tablet Commonly known as: FERGON Take 324 mg by mouth daily.   Fish Oil 1000 MG Caps Take 4,000 mg by mouth daily.   hydrALAZINE 25 MG tablet Commonly known as: APRESOLINE Take 1 tablet (25 mg total) by mouth every 8 (eight) hours.   L-Theanine 100 MG Caps Take 800 mg by mouth daily.   losartan 50 MG tablet Commonly known as: COZAAR Take 1 tablet (50 mg total) by mouth daily. Start taking on: October 07, 2019   metoprolol succinate 25 MG 24 hr tablet Commonly known as: TOPROL-XL Take 1 tablet (25 mg total) by mouth daily. Start taking on: October 07, 2019 What changed: Another medication with the same name was removed. Continue taking this medication, and follow the directions you see here.   MILK THISTLE PO Take 1 tablet by mouth daily.   polyethylene glycol 17 g packet Commonly known as: MIRALAX / GLYCOLAX Take 17 g by mouth daily.   traMADol 50 MG tablet Commonly known as: ULTRAM Take 1 tablet (50 mg total) by mouth every 6 (six) hours as needed for moderate pain.   vitamin B-12 1000 MCG tablet Commonly known as: CYANOCOBALAMIN Take 1 tablet (1,000 mcg total) by mouth daily.   Zoloft 100 MG tablet Generic drug: sertraline Take 200 mg by mouth daily.      Contact information for after-discharge care     [facility] .   Service: Skilled Nursing Contact information: [place] [phone]             Allergies  Allergen Reactions  . Ivp Dye [Iodinated Diagnostic Agents] Other (See Comments)  . Lactose   . Methylprednisolone Other (See Comments)    Reaction: Hallucinations and Psychosis  . Peanuts [Peanut Oil] Other (See Comments)    Swelling and vomitting  . Vancomycin Other (See Comments)    Reaction: unknown  . Wellbutrin [Bupropion] Other (See Comments)    Hallucinations    Consultations:  Nephrology  Cardiology  Procedures/Studies: DG Chest 2 View  Result Date: 09/28/2019 CLINICAL DATA:  Shortness of breath EXAM: CHEST - 2 VIEW COMPARISON:  06/29/2019 FINDINGS: Cardiac shadow is enlarged but stable. Aortic calcifications are again seen. Small bilateral pleural effusions are noted. Mild bibasilar atelectasis is seen stable from the prior exam. Old rib fractures are seen bilaterally. IMPRESSION: Mild bibasilar atelectasis. Electronically  Signed   By: Inez Catalina M.D.   On: 09/28/2019 15:50   PERIPHERAL VASCULAR CATHETERIZATION  Result Date: 10/02/2019 See Op Note  US Venous Img Lower Bilateral (DVT)  Result Date: 09/30/2019 CLINICAL DATA:  85 year old female with a history of swelling EXAM: BILATERAL LOWER EXTREMITY VENOUS DOPPLER ULTRASOUND TECHNIQUE: Gray-scale sonography with graded compression, as well as color Doppler and duplex ultrasound were performed to evaluate the lower extremity deep venous systems from the level of the common femoral vein and including the common femoral, femoral, profunda femoral, popliteal and calf veins including the posterior tibial, peroneal and gastrocnemius veins when visible. The superficial great saphenous vein was also interrogated. Spectral Doppler was utilized to evaluate flow at rest and with distal augmentation maneuvers in the common femoral,  femoral and popliteal veins. COMPARISON:  None. FINDINGS: RIGHT LOWER EXTREMITY Common Femoral Vein: No evidence of thrombus. Normal compressibility, respiratory phasicity and response to augmentation. Saphenofemoral Junction: No evidence of thrombus. Normal compressibility and flow on color Doppler imaging. Profunda Femoral Vein: No evidence of thrombus. Normal compressibility and flow on color Doppler imaging. Femoral Vein: No evidence of thrombus. Normal compressibility, respiratory phasicity and response to augmentation. Popliteal Vein: No evidence of thrombus. Normal compressibility, respiratory phasicity and response to augmentation. Calf Veins: Calf veins not well visualized. Superficial Great Saphenous Vein: No evidence of thrombus. Normal compressibility and flow on color Doppler imaging. Other Findings:  Edema LEFT LOWER EXTREMITY Common Femoral Vein: No evidence of thrombus. Normal compressibility, respiratory phasicity and response to augmentation. Saphenofemoral Junction: No evidence of thrombus. Normal compressibility and flow on color Doppler imaging. Profunda Femoral Vein: No evidence of thrombus. Normal compressibility and flow on color Doppler imaging. Femoral Vein: No evidence of thrombus. Normal compressibility, respiratory phasicity and response to augmentation. Popliteal Vein: No evidence of thrombus. Normal compressibility, respiratory phasicity and response to augmentation. Calf Veins: Calf veins not well visualized. Superficial Great Saphenous Vein: No evidence of thrombus. Normal compressibility and flow on color Doppler imaging. Other Findings:  Edema IMPRESSION: Sonographic survey of the bilateral lower extremities negative for DVT. Bilateral edema. Electronically Signed   By: Corrie Mckusick D.O.   On: 09/30/2019 09:40   VAS [place] UPPER EXTREMITY ARTERIAL DUPLEX  Result Date: 09/13/2019 UPPER EXTREMITY DUPLEX STUDY Indications: Patient complains of Pre-op Access.  Performing Technologist:  Charlane Ferretti RT (R)(VS)  Examination Guidelines: A complete evaluation includes B-mode imaging, spectral Doppler, color Doppler, and power Doppler as needed of all accessible portions of each vessel. Bilateral testing is considered an integral part of a complete examination. Limited examinations for reoccurring indications may be performed as noted.  Right Pre-Dialysis Findings: +-----------------------+----------+--------------------+--------+--------+ Location               PSV (cm/s)Intralum. Diam. (cm)WaveformComments +-----------------------+----------+--------------------+--------+--------+ Brachial Antecub. fossa190       0.36                                 +-----------------------+----------+--------------------+--------+--------+ Radial Art at Wrist    65        0.15                                 +-----------------------+----------+--------------------+--------+--------+ Ulnar Art at Wrist     59        0.23                                 +-----------------------+----------+--------------------+--------+--------+  Left Pre-Dialysis Findings: +-----------------------+----------+--------------------+--------+--------+ Location               PSV (cm/s)Intralum. Diam. (cm)WaveformComments +-----------------------+----------+--------------------+--------+--------+ Brachial Antecub. fossa126       0.33                                 +-----------------------+----------+--------------------+--------+--------+ Radial Art at Wrist    76        0.13                                 +-----------------------+----------+--------------------+--------+--------+ Ulnar Art at Wrist     31        0.16                                 +-----------------------+----------+--------------------+--------+--------+  Summary:  Right: No obstruction visualized in the right upper extremity. Left: No obstruction visualized in the left upper extremity. *See table(s) above for  measurements and observations. Electronically signed by Hortencia Pilar MD on 09/13/2019 at 5:06:22 PM.    Final    VAS [place] UPPER EXT VEIN MAPPING (PRE-OP AVF)  Result Date: 09/13/2019 UPPER EXTREMITY VEIN MAPPING  Indications: Pre-access. Performing Technologist: Charlane Ferretti RT (R)(VS)  Examination Guidelines: A complete evaluation includes B-mode imaging, spectral Doppler, color Doppler, and power Doppler as needed of all accessible portions of each vessel. Bilateral testing is considered an integral part of a complete examination. Limited examinations for reoccurring indications may be performed as noted. +-----------------+-------------+----------+--------------+ Right Cephalic   Diameter (cm)Depth (cm)   Findings    +-----------------+-------------+----------+--------------+ Prox upper arm       0.16                              +-----------------+-------------+----------+--------------+ Mid upper arm        0.12                              +-----------------+-------------+----------+--------------+ Dist upper arm                          not visualized +-----------------+-------------+----------+--------------+ Antecubital fossa                       not visualized +-----------------+-------------+----------+--------------+ Prox forearm                            not visualized +-----------------+-------------+----------+--------------+ Mid forearm                             not visualized +-----------------+-------------+----------+--------------+ Dist forearm                            not visualized +-----------------+-------------+----------+--------------+ +-----------------+-------------+----------+--------+ Right Basilic    Diameter (cm)Depth (cm)Findings +-----------------+-------------+----------+--------+ Prox upper arm       0.41                        +-----------------+-------------+----------+--------+ Mid upper arm        0.30                         +-----------------+-------------+----------+--------+  Dist upper arm       0.25                        +-----------------+-------------+----------+--------+ Antecubital fossa    0.19                        +-----------------+-------------+----------+--------+ Prox forearm         0.22                        +-----------------+-------------+----------+--------+ +-----------------+-------------+----------+--------------+ Left Cephalic    Diameter (cm)Depth (cm)   Findings    +-----------------+-------------+----------+--------------+ Prox upper arm       0.13                              +-----------------+-------------+----------+--------------+ Mid upper arm        0.11                              +-----------------+-------------+----------+--------------+ Dist upper arm                          not visualized +-----------------+-------------+----------+--------------+ Antecubital fossa                       not visualized +-----------------+-------------+----------+--------------+ Prox forearm         0.12                              +-----------------+-------------+----------+--------------+ Mid forearm          0.15                              +-----------------+-------------+----------+--------------+ Dist forearm         0.11                              +-----------------+-------------+----------+--------------+ +-----------------+-------------+----------+--------+ Left Basilic     Diameter (cm)Depth (cm)Findings +-----------------+-------------+----------+--------+ Prox upper arm       0.35                        +-----------------+-------------+----------+--------+ Mid upper arm        0.38                        +-----------------+-------------+----------+--------+ Dist upper arm       0.31                        +-----------------+-------------+----------+--------+ Antecubital fossa    0.30                         +-----------------+-------------+----------+--------+ Prox forearm         0.16                        +-----------------+-------------+----------+--------+ Summary: Right: Right cephalic vein is not visualized from mid to distal        forearm. Right cephalic and basilic veins are compressable. Left: Left cephalic and basilic veins are compressable. *See table(s) above for measurements and observations.  Diagnosing physician: Hortencia Pilar MD Electronically signed by Hortencia Pilar MD on 09/13/2019 at 5:06:24 PM.    Final      Subjective: Patient has no new complaints today.  She was seen during dialysis.  Discharge Exam: Vitals:   10/06/19 0617 10/06/19 0745  BP: (!) 156/74 (!) 151/64  Pulse: 75 72  Resp:  18  Temp:  (!) 97.5 F (36.4 C)  SpO2: 95% 92%   Vitals:   10/06/19 0017 10/06/19 0453 10/06/19 0617 10/06/19 0745  BP: (!) 147/53  (!) 156/74 (!) 151/64  Pulse: 74  75 72  Resp: 16   18  Temp: 97.6 F (36.4 C)   (!) 97.5 F (36.4 C)  TempSrc: Oral   Oral  SpO2: 95%  95% 92%  Weight:  88.1 kg    Height:        General: Pt is alert, awake, not in acute distress Cardiovascular: RRR, S1/S2 +, no rubs, no gallops Respiratory: CTA bilaterally, no wheezing, no rhonchi Abdominal: Soft, NT, ND, bowel sounds + Extremities: 1+ LLE edema, right lower extremity with Unna boot, no cyanosis   The results of significant diagnostics from this hospitalization (including imaging, microbiology, ancillary and laboratory) are listed below for reference.    Microbiology: Recent Results (from the past 240 hour(s))  SARS CORONAVIRUS 2 (TAT 6-24 HRS) Nasopharyngeal Nasopharyngeal Swab     Status: None   Collection Time: 09/28/19  5:27 PM   Specimen: Nasopharyngeal Swab  Result Value Ref Range Status   SARS Coronavirus 2 NEGATIVE NEGATIVE Final    Comment: (NOTE) SARS-CoV-2 target nucleic acids are NOT DETECTED. The SARS-CoV-2 RNA is generally detectable in upper and  lower respiratory specimens during the acute phase of infection. Negative results do not preclude SARS-CoV-2 infection, do not rule out co-infections with other pathogens, and should not be used as the sole basis for treatment or other patient management decisions. Negative results must be combined with clinical observations, patient history, and epidemiological information. The expected result is Negative. Fact Sheet for Patients: SugarRoll.be Fact Sheet for Healthcare Providers: https://www.woods-mathews.com/ This test is not yet approved or cleared by the [place] FDA and  has been authorized for detection and/or diagnosis of SARS-CoV-2 by FDA under an Emergency Use Authorization (EUA). This EUA will remain  in effect (meaning this test can be used) for the duration of the COVID-19 declaration under Section 56 4(b)(1) of the Act, 21 U.S.C. section 360bbb-3(b)(1), unless the authorization is terminated or revoked sooner. Performed at [facility], [place] [address] [place], [place] [address]   SARS CORONAVIRUS 2 (TAT 6-24 HRS) Nasopharyngeal Nasopharyngeal Swab     Status: None   Collection Time: 10/05/19  8:07 PM   Specimen: Nasopharyngeal Swab  Result Value Ref Range Status   SARS Coronavirus 2 NEGATIVE NEGATIVE Final    Comment: (NOTE) SARS-CoV-2 target nucleic acids are NOT DETECTED. The SARS-CoV-2 RNA is generally detectable in upper and lower respiratory specimens during the acute phase of infection. Negative results do not preclude SARS-CoV-2 infection, do not rule out co-infections with other pathogens, and should not be used as the sole basis for treatment or other patient management decisions. Negative results must be combined with clinical observations, patient history, and epidemiological information. The expected result is Negative. Fact Sheet for Patients: SugarRoll.be Fact Sheet for  Healthcare Providers: https://www.woods-mathews.com/ This test is not yet approved or cleared by the [place] FDA and  has been authorized for detection and/or diagnosis of  SARS-CoV-2 by FDA under an Emergency Use Authorization (EUA). This EUA will remain  in effect (meaning this test can be used) for the duration of the COVID-19 declaration under Section 56 4(b)(1) of the Act, 21 U.S.C. section 360bbb-3(b)(1), unless the authorization is terminated or revoked sooner. Performed at [facility], [place] [address] [place], [place] [address]      Labs: BNP (last 3 results) Recent Labs    09/29/19 1031 09/30/19 0600 10/01/19 0450  BNP 1,668.0* 1,048.0* 494.4*   Basic Metabolic Panel: Recent Labs  Lab 09/30/19 0600 10/01/19 0450 10/02/19 0503 10/02/19 0504 10/03/19 0506 10/04/19 0454  NA 142 142 139  --  140 142  K 4.4 4.3 5.1  --  4.1 4.2  CL 108 108 107  --  108 105  CO2 27 27 23   --  25 28  GLUCOSE 90 92 87  --  92 88  BUN 60* 66* 62*  --  57* 41*  CREATININE 3.17* 3.39* 3.24*  --  3.26* 2.61*  CALCIUM 8.5* 8.5* 8.6*  --  8.7* 9.0  PHOS  --   --   --  4.9*  --  3.7   Liver Function Tests: Recent Labs  Lab 10/04/19 0454  ALBUMIN 2.7*   No results for input(s): LIPASE, AMYLASE in the last 168 hours. No results for input(s): AMMONIA in the last 168 hours. CBC: Recent Labs  Lab 10/06/19 0720  WBC 6.4  HGB 8.3*  HCT 25.8*  MCV 89.6  PLT 208   Cardiac Enzymes: No results for input(s): CKTOTAL, CKMB, CKMBINDEX, TROPONINI in the last 168 hours. BNP: Invalid input(s): POCBNP CBG: No results for input(s): GLUCAP in the last 168 hours. D-Dimer No results for input(s): DDIMER in the last 72 hours. Hgb A1c No results for input(s): HGBA1C in the last 72 hours. Lipid Profile No results for input(s): CHOL, HDL, LDLCALC, TRIG, CHOLHDL, LDLDIRECT in the last 72 hours. Thyroid function studies No results for input(s): TSH, T4TOTAL, T3FREE,  THYROIDAB in the last 72 hours.  Invalid input(s): FREET3 Anemia work up No results for input(s): VITAMINB12, FOLATE, FERRITIN, TIBC, IRON, RETICCTPCT in the last 72 hours. Urinalysis    Component Value Date/Time   COLORURINE COLORLESS (A) 06/27/2019 0456   APPEARANCEUR CLEAR (A) 06/27/2019 0456   APPEARANCEUR Clear 04/19/2013 0807   LABSPEC 1.008 06/27/2019 0456   LABSPEC 1.012 04/19/2013 0807   PHURINE 6.0 06/27/2019 0456   GLUCOSEU NEGATIVE 06/27/2019 0456   GLUCOSEU 50 mg/dL 04/19/2013 0807   HGBUR NEGATIVE 06/27/2019 0456   BILIRUBINUR NEGATIVE 06/27/2019 0456   BILIRUBINUR Negative 04/19/2013 0807   KETONESUR NEGATIVE 06/27/2019 0456   PROTEINUR 30 (A) 06/27/2019 0456   NITRITE NEGATIVE 06/27/2019 0456   LEUKOCYTESUR NEGATIVE 06/27/2019 0456   LEUKOCYTESUR Negative 04/19/2013 0807   Sepsis Labs Invalid input(s): PROCALCITONIN,  WBC,  LACTICIDVEN Microbiology Recent Results (from the past 240 hour(s))  SARS CORONAVIRUS 2 (TAT 6-24 HRS) Nasopharyngeal Nasopharyngeal Swab     Status: None   Collection Time: 09/28/19  5:27 PM   Specimen: Nasopharyngeal Swab  Result Value Ref Range Status   SARS Coronavirus 2 NEGATIVE NEGATIVE Final    Comment: (NOTE) SARS-CoV-2 target nucleic acids are NOT DETECTED. The SARS-CoV-2 RNA is generally detectable in upper and lower respiratory specimens during the acute phase of infection. Negative results do not preclude SARS-CoV-2 infection, do not rule out co-infections with other pathogens, and should not be used as the sole basis for treatment or  other patient management decisions. Negative results must be combined with clinical observations, patient history, and epidemiological information. The expected result is Negative. Fact Sheet for Patients: SugarRoll.be Fact Sheet for Healthcare Providers: https://www.woods-mathews.com/ This test is not yet approved or cleared by the [place] FDA  and  has been authorized for detection and/or diagnosis of SARS-CoV-2 by FDA under an Emergency Use Authorization (EUA). This EUA will remain  in effect (meaning this test can be used) for the duration of the COVID-19 declaration under Section 56 4(b)(1) of the Act, 21 U.S.C. section 360bbb-3(b)(1), unless the authorization is terminated or revoked sooner. Performed at [facility], [place] [address], [place], [place] [address]   SARS CORONAVIRUS 2 (TAT 6-24 HRS) Nasopharyngeal Nasopharyngeal Swab     Status: None   Collection Time: 10/05/19  8:07 PM   Specimen: Nasopharyngeal Swab  Result Value Ref Range Status   SARS Coronavirus 2 NEGATIVE NEGATIVE Final    Comment: (NOTE) SARS-CoV-2 target nucleic acids are NOT DETECTED. The SARS-CoV-2 RNA is generally detectable in upper and lower respiratory specimens during the acute phase of infection. Negative results do not preclude SARS-CoV-2 infection, do not rule out co-infections with other pathogens, and should not be used as the sole basis for treatment or other patient management decisions. Negative results must be combined with clinical observations, patient history, and epidemiological information. The expected result is Negative. Fact Sheet for Patients: SugarRoll.be Fact Sheet for Healthcare Providers: https://www.woods-mathews.com/ This test is not yet approved or cleared by the [place] FDA and  has been authorized for detection and/or diagnosis of SARS-CoV-2 by FDA under an Emergency Use Authorization (EUA). This EUA will remain  in effect (meaning this test can be used) for the duration of the COVID-19 declaration under Section 56 4(b)(1) of the Act, 21 U.S.C. section 360bbb-3(b)(1), unless the authorization is terminated or revoked sooner. Performed at [facility], [place] [address] [place], [place] [address]     Time coordinating discharge: Over 30  minutes  SIGNED:  Lorella Nimrod, MD  [facility] 10/06/2019, 11:15 AM  If 7PM-7AM, please contact night-coverage [email or web address]  This record has been created using Systems analyst. Errors have been sought and corrected,but may not always be located. Such creation errors do not reflect on the standard of care.

## 2019-10-06 NOTE — Plan of Care (Signed)
  Problem: Education: Goal: Ability to demonstrate management of disease process will improve Outcome: Progressing   Problem: Activity: Goal: Capacity to carry out activities will improve Outcome: Progressing   Problem: Cardiac: Goal: Ability to achieve and maintain adequate cardiopulmonary perfusion will improve Outcome: Progressing   Problem: Clinical Measurements: Goal: Ability to maintain clinical measurements within normal limits will improve Outcome: Progressing   Problem: Activity: Goal: Risk for activity intolerance will decrease Outcome: Progressing   Problem: Nutrition: Goal: Adequate nutrition will be maintained Outcome: Progressing   Problem: Elimination: Goal: Will not experience complications related to bowel motility Outcome: Progressing   Problem: Skin Integrity: Goal: Risk for impaired skin integrity will decrease Outcome: Progressing

## 2019-10-06 NOTE — Progress Notes (Signed)
   10/06/19 1323  Hand-Off documentation  Handoff Given Given to shift RN/LPN  Report given to (Full Name) [facility] RN  Handoff Received Received from shift RN/LPN  Report received from (Full Name) [facility] Lomax H  Vital Signs  Temp (!) 97.5 F (36.4 C)  Temp Source Oral  Pulse Rate 85  Pulse Rate Source Monitor  Resp (!) 23  BP (!) 112/54  BP Location Right Arm  BP Method Automatic  Patient Position (if appropriate) Lying  Oxygen Therapy  SpO2 97 %  O2 Device Room Air  Pain Assessment  Pain Scale 0-10  Pain Score 0  During Hemodialysis Assessment  Blood Flow Rate (mL/min) 150 mL/min  Arterial Pressure (mmHg) 50 mmHg  Venous Pressure (mmHg) 60 mmHg  Transmembrane Pressure (mmHg) 60 mmHg  Ultrafiltration Rate (mL/min) 0 mL/min  Dialysate Flow Rate (mL/min) 600 ml/min  Conductivity: Machine  13.4  HD Safety Checks Performed Yes  KECN 55.3 KECN  Dialysis Fluid Bolus Normal Saline  Bolus Amount (mL) 250 mL  Intra-Hemodialysis Comments Tolerated well;Tx completed  Post-Hemodialysis Assessment  Rinseback Volume (mL) 250 mL  KECN 55.3 V  Dialyzer Clearance Clear  Duration of HD Treatment -hour(s) 3.5 hour(s)  Hemodialysis Intake (mL) 500 mL  UF Total -Machine (mL) 1397 mL  Net UF (mL) 897 mL  Tolerated HD Treatment Yes  Post-Hemodialysis Comments HD treatment complete  Education / Care Plan  Dialysis Education Provided Yes  Hemodialysis Catheter Right Internal jugular Double lumen Permanent (Tunneled)  Placement Date/Time: 10/02/19 0854   Time Out: Correct patient;Correct site;Correct procedure  Maximum sterile barrier precautions: Hand hygiene;Mask;Cap;Sterile gown;Sterile gloves;Large sterile sheet  Site Prep: Chlorhexidine (preferred)  Local Anes...  Site Condition No complications  Blue Lumen Status Flushed;Heparin locked  Red Lumen Status Flushed;Heparin locked  Catheter fill solution Heparin 1000 units/ml  Catheter fill volume (Arterial) 1.5 cc  Catheter fill volume  (Venous) 1.5  Dressing Status Clean;Dry;Intact  Drainage Description None  Post treatment catheter status Capped and Clamped  treatment completed tolerated well

## 2019-10-06 NOTE — TOC Transition Note (Addendum)
Transition of Care [facility]) - CM/SW Discharge Note   Patient Details  Name: Misty Berry MRN: [id number] Date of Birth: 1934-10-31  [facility]) CM/SW Contact:  Marshell Garfinkel, RN Phone Number: 10/06/2019, 10:53 AM   Clinical Narrative:     Patient currently in hemodialysis. MD request to discharge patient today. [facility] has accepted patient for SNF. RNCM spoke with sister-in-law [name] and she agrees. Patient will need EMS. Report [phone] room number 30A. RNCM will update FL2 when discharge summary is completed. RN will be updated when complete and RNCM will call EMS when RN is ready. Estill Bamberg with [facility] [employer] updated. Update 1222: orders sent to [facility]. Update at 1435: EMS notified patient ready for transitions to [facility]. Brother also updated.  Final next level of care: Skilled Nursing Facility Barriers to Discharge: No Barriers Identified   Patient Goals and CMS Choice Patient states their goals for this hospitalization and ongoing recovery are:: to get better CMS Medicare.gov Compare Post Acute Care list provided to:: Patient Represenative (must comment) Choice offered to / list presented to : Patient  Discharge Placement              Patient chooses bed at: [facility] Patient to be transferred to facility by: EMS Name of family member notified: [name] Patient and family notified of of transfer: 10/06/19  Discharge Plan and Services In-house Referral: Clinical Social Work   Post Acute Care Choice: [place]                               Social Determinants of Health (SDOH) Interventions     Readmission Risk Interventions Readmission Risk Prevention Plan 10/03/2019  Transportation Screening Complete  PCP or Specialist Appt within 3-5 Days Complete  HRI or [place] Complete  Palliative Care Screening Not Applicable  Medication Review (RN Care Manager) Referral to  Pharmacy  Some recent data might be hidden

## 2019-10-06 NOTE — NC FL2 (Signed)
[place] LEVEL OF CARE SCREENING TOOL     IDENTIFICATION  Patient Name: Misty Berry Birthdate: [birth year] Sex: female Admission Date (Current Location): 09/28/2019  [place] and [place] Number:  Engineering geologist and Address:  [facility], [address] [place], [place] [address]      Provider Number: [id number]  Attending Physician Name and Address:  Lorella Nimrod, MD  Relative Name and Phone Number:  Beckie Salts (Brother) [phone] [facility], [place])    Current Level of Care: Hospital Recommended Level of Care: [place] Prior Approval Number:    Date Approved/Denied:   PASRR Number: [id number]  Discharge Plan: SNF    Current Diagnoses: Patient Active Problem List   Diagnosis Date Noted  . Swelling   . Hypertensive heart and renal disease   . Palliative care by specialist   . Advanced care planning/counseling discussion   . Acute on chronic diastolic heart failure ([place])   . CHF (congestive heart failure) ([place]) 09/28/2019  . Lymphedema 09/10/2019  . Iron deficiency anemia 08/07/2019  . Anemia in stage 4 chronic kidney disease ([place]) 08/07/2019  . Acute respiratory failure with hypoxia ([place]) 07/02/2019  . Anemia of chronic kidney failure, stage 5 ([place]) 07/02/2019  . Dyspnea   . Hypoxemia 06/27/2019  . Acute on chronic systolic (congestive) heart failure ([place]) 06/27/2019  . Acid reflux 08/27/2015  . Cellulitis 08/27/2015  . Abnormal bruising 08/27/2015  . Arthritis, degenerative 08/27/2015  . OP (osteoporosis) 08/27/2015  . Presence of artificial eye 08/27/2015  . History of surgical procedure 08/27/2015  . Hypertensive heart disease with stage 4 chronic kidney disease ([place]) 04/30/2015  . Laceration of forearm without foreign body 04/30/2015  . Venous insufficiency of leg 02/28/2015  . Gluteal tendinitis 01/22/2015  . Degenerative arthritis of lumbar spine 12/12/2014  . Back muscle spasm 11/14/2014  .  Degeneration of intervertebral disc of lumbar region 11/14/2014  . Neuritis or radiculitis due to rupture of lumbar intervertebral disc 11/14/2014  . Allergic rhinitis 08/31/2014  . Clinical depression 02/19/2014  . Gait instability 02/19/2014  . Benign hypertensive heart and kidney disease 10/31/2013  . Left heart failure ([place]) 10/31/2013  . Branch retinal vein occlusion 09/11/2013  . Asthma 06/20/2013  . Essential hypertension 06/20/2013  . Follow-up examination 04/30/2013  . Acute renal failure ([place]) 02/16/2013  . Fall 02/16/2013  . Closed fracture of part of neck of femur ([place]) 02/16/2013  . Cellulitis and abscess of leg 10/30/2012  . Cellulitis of lower extremity 10/30/2012  . CKD (chronic kidney disease), stage V ([place]) 10/30/2012  . Central retinal edema, cystoid 01/31/2012  . Impaired vision 12/17/2011    Orientation RESPIRATION BLADDER Height & Weight     Self, Place  O2 External catheter Weight: 194 lb 3.6 oz (88.1 kg) Height:  5\' 6"  (167.6 cm)  BEHAVIORAL SYMPTOMS/MOOD NEUROLOGICAL BOWEL NUTRITION STATUS      Incontinent    AMBULATORY STATUS COMMUNICATION OF NEEDS Skin   Extensive Assist                           Personal Care Assistance Level of Assistance  Bathing, Feeding, Dressing, Total care Bathing Assistance: Maximum assistance Feeding assistance: Maximum assistance Dressing Assistance: Maximum assistance Total Care Assistance: Maximum assistance   Functional Limitations Info  Sight(Blind) Sight Info: Impaired(blind)        SPECIAL CARE FACTORS FREQUENCY  PT (By licensed PT), OT (By licensed OT)  PT Frequency: 5x weekly OT Frequency: 5x weekly            Contractures      Additional Factors Info  Code Status Code Status Info: Full             Current Medications (10/06/2019):  This is the current hospital active medication list Current Facility-Administered Medications  Medication Dose Route Frequency Provider Last Rate Last  Admin  . 0.9 %  sodium chloride infusion  250 mL Intravenous PRN Schnier, Dolores Lory, MD      . 0.9 %  sodium chloride infusion  100 mL Intravenous PRN Schnier, Dolores Lory, MD      . 0.9 %  sodium chloride infusion  100 mL Intravenous PRN Schnier, Dolores Lory, MD      . acetaminophen (TYLENOL) tablet 650 mg  650 mg Oral Q4H PRN Schnier, Dolores Lory, MD   650 mg at 09/30/19 2120  . acidophilus (RISAQUAD) capsule 2 capsule  2 capsule Oral TID Katha Cabal, MD   2 capsule at 10/06/19 0831  . alteplase (CATHFLO ACTIVASE) injection 2 mg  2 mg Intracatheter Once PRN Schnier, Dolores Lory, MD      . aspirin EC tablet 81 mg  81 mg Oral Daily Schnier, Dolores Lory, MD   81 mg at 10/06/19 0830  . bumetanide (BUMEX) tablet 1 mg  1 mg Oral Daily Schnier, Dolores Lory, MD   1 mg at 10/06/19 0831  . busPIRone (BUSPAR) tablet 5 mg  5 mg Oral Daily Schnier, Dolores Lory, MD   5 mg at 10/06/19 0830  . calcium-vitamin D (OSCAL WITH D) 500-200 MG-UNIT per tablet 1 tablet  1 tablet Oral Daily Schnier, Dolores Lory, MD   1 tablet at 10/06/19 0830  . Chlorhexidine Gluconate Cloth 2 % PADS 6 each  6 each Topical Q0600 Schnier, Dolores Lory, MD   6 each at 10/06/19 0454  . docusate sodium (COLACE) capsule 100 mg  100 mg Oral BID Schnier, Dolores Lory, MD   100 mg at 10/06/19 0831  . epoetin alfa (EPOGEN) injection 4,000 Units  4,000 Units Intravenous Q M,W,F-HD Holley Raring, [place], MD   4,000 Units at 10/05/19 1708  . ferrous gluconate (FERGON) tablet 324 mg  324 mg Oral Daily Schnier, Dolores Lory, MD   324 mg at 10/06/19 0831  . heparin injection 1,000 Units  1,000 Units Dialysis PRN Katha Cabal, MD      . heparin injection 5,000 Units  5,000 Units Subcutaneous Q8H Schnier, Dolores Lory, MD   5,000 Units at 10/06/19 1829  . hydrALAZINE (APRESOLINE) tablet 25 mg  25 mg Oral Q8H Schnier, Dolores Lory, MD   25 mg at 10/06/19 [phone]  . HYDROmorphone (DILAUDID) injection 1 mg  1 mg Intravenous Once PRN Schnier, Dolores Lory, MD      . lidocaine (PF)  (XYLOCAINE) 1 % injection 5 mL  5 mL Intradermal PRN Schnier, Dolores Lory, MD      . lidocaine-prilocaine (EMLA) cream 1 application  1 application Topical PRN Schnier, Dolores Lory, MD      . losartan (COZAAR) tablet 50 mg  50 mg Oral Daily Lorella Nimrod, MD   50 mg at 10/06/19 0850  . metoprolol succinate (TOPROL-XL) 24 hr tablet 25 mg  25 mg Oral Daily Schnier, Dolores Lory, MD   25 mg at 10/06/19 0830  . omega-3 acid ethyl esters (LOVAZA) capsule 1,000 mg  1,000 mg Oral Daily Schnier, Dolores Lory, MD   1,000  mg at 10/06/19 0830  . ondansetron (ZOFRAN) injection 4 mg  4 mg Intravenous Q6H PRN Schnier, Dolores Lory, MD      . ondansetron [facility]) injection 4 mg  4 mg Intravenous Q6H PRN Schnier, Dolores Lory, MD      . pantoprazole (PROTONIX) EC tablet 40 mg  40 mg Oral Daily Schnier, Dolores Lory, MD   40 mg at 10/06/19 0829  . pentafluoroprop-tetrafluoroeth (GEBAUERS) aerosol 1 application  1 application Topical PRN Schnier, Dolores Lory, MD      . polyethylene glycol (MIRALAX / GLYCOLAX) packet 17 g  17 g Oral Daily Schnier, Dolores Lory, MD      . sertraline (ZOLOFT) tablet 200 mg  200 mg Oral Daily Schnier, Dolores Lory, MD   200 mg at 10/06/19 0830  . sodium chloride flush (NS) 0.9 % injection 3 mL  3 mL Intravenous Q12H Schnier, Dolores Lory, MD   3 mL at 10/05/19 2144  . sodium chloride flush (NS) 0.9 % injection 3 mL  3 mL Intravenous PRN Schnier, Dolores Lory, MD      . traMADol Veatrice Bourbon) tablet 25 mg  25 mg Oral Q6H PRN Schnier, Dolores Lory, MD   25 mg at 10/03/19 1552  . vitamin B-12 (CYANOCOBALAMIN) tablet 1,000 mcg  1,000 mcg Oral Daily Schnier, Dolores Lory, MD   1,000 mcg at 10/06/19 0830     Discharge Medications: Please see discharge summary for a list of discharge medications.  Relevant Imaging Results:  Relevant Lab Results:   Additional Information SSN:[id number]  Meriel Flavors, LCSW

## 2019-10-06 NOTE — Progress Notes (Signed)
Misty Berry  MRN: [id number]  DOB/AGE: 07-07-1934 85 y.o.  Primary Care Physician:Powell-Tillman, Sara Chu, MD  Admit date: 09/28/2019  Chief Complaint:  Chief Complaint  Patient presents with  . Shortness of Breath    S-Pt presented on  09/28/2019 with  Chief Complaint  Patient presents with  . Shortness of Breath  . Patient seen on dialysis today. Patient tolerating treatment well  Medications . acidophilus  2 capsule Oral TID  . aspirin EC  81 mg Oral Daily  . bumetanide  1 mg Oral Daily  . busPIRone  5 mg Oral Daily  . calcium-vitamin D  1 tablet Oral Daily  . Chlorhexidine Gluconate Cloth  6 each Topical Q0600  . docusate sodium  100 mg Oral BID  . epoetin (EPOGEN/PROCRIT) injection  4,000 Units Intravenous Q M,W,F-HD  . ferrous gluconate  324 mg Oral Daily  . heparin  5,000 Units Subcutaneous Q8H  . hydrALAZINE  25 mg Oral Q8H  . losartan  50 mg Oral Daily  . metoprolol succinate  25 mg Oral Daily  . omega-3 acid ethyl esters  1,000 mg Oral Daily  . pantoprazole  40 mg Oral Daily  . polyethylene glycol  17 g Oral Daily  . sertraline  200 mg Oral Daily  . sodium chloride flush  3 mL Intravenous Q12H  . vitamin B-12  1,000 mcg Oral Daily         ENI:DPOEU from the symptoms mentioned above,there are no other symptoms referable to all systems reviewed.  Physical Exam: Vital signs in last 24 hours: Temp:  [97.5 F (36.4 C)-98 F (36.7 C)] 97.5 F (36.4 C) (04/24 0745) Pulse Rate:  [70-83] 72 (04/24 0745) Resp:  [16-22] 18 (04/24 0745) BP: (122-172)/(53-97) 151/64 (04/24 0745) SpO2:  [92 %-100 %] 92 % (04/24 0745) Weight:  [88.1 kg] 88.1 kg (04/24 0453) Weight change:  Last BM Date: 10/04/19(per patient she alos refused laxatives )  Intake/Output from previous day: 04/23 0701 - 04/24 0700 In: 360 [P.O.:360] Out: 2928 [Urine:2150] No intake/output data recorded.   Physical Exam: General- pt is awake,alert, oriented to time place and  person Resp- No acute REsp distress, CTA B/L NO Rhonchi CVS- S1S2 regular in rate and rhythm GIT- BS+, soft, NT, ND EXT- NO LE Edema, Cyanosis Access right tunneled catheter in situ  Lab Results: CBC Recent Labs    10/06/19 0720  WBC 6.4  HGB 8.3*  HCT 25.8*  PLT 208    BMET Recent Labs    10/04/19 0454  NA 142  K 4.2  CL 105  CO2 28  GLUCOSE 88  BUN 41*  CREATININE 2.61*  CALCIUM 9.0    MICRO Recent Results (from the past 240 hour(s))  SARS CORONAVIRUS 2 (TAT 6-24 HRS) Nasopharyngeal Nasopharyngeal Swab     Status: None   Collection Time: 09/28/19  5:27 PM   Specimen: Nasopharyngeal Swab  Result Value Ref Range Status   SARS Coronavirus 2 NEGATIVE NEGATIVE Final    Comment: (NOTE) SARS-CoV-2 target nucleic acids are NOT DETECTED. The SARS-CoV-2 RNA is generally detectable in upper and lower respiratory specimens during the acute phase of infection. Negative results do not preclude SARS-CoV-2 infection, do not rule out co-infections with other pathogens, and should not be used as the sole basis for treatment or other patient management decisions. Negative results must be combined with clinical observations, patient history, and epidemiological information. The expected result is Negative. Fact Sheet for Patients: SugarRoll.be Fact Sheet for  Healthcare Providers: https://www.woods-mathews.com/ This test is not yet approved or cleared by the [place] and  has been authorized for detection and/or diagnosis of SARS-CoV-2 by FDA under an Emergency Use Authorization (EUA). This EUA will remain  in effect (meaning this test can be used) for the duration of the COVID-19 declaration under Section 56 4(b)(1) of the Act, 21 U.S.C. section 360bbb-3(b)(1), unless the authorization is terminated or revoked sooner. Performed at [facility], [place] [address], [place], [place] [address]   SARS CORONAVIRUS 2 (TAT 6-24  HRS) Nasopharyngeal Nasopharyngeal Swab     Status: None   Collection Time: 10/05/19  8:07 PM   Specimen: Nasopharyngeal Swab  Result Value Ref Range Status   SARS Coronavirus 2 NEGATIVE NEGATIVE Final    Comment: (NOTE) SARS-CoV-2 target nucleic acids are NOT DETECTED. The SARS-CoV-2 RNA is generally detectable in upper and lower respiratory specimens during the acute phase of infection. Negative results do not preclude SARS-CoV-2 infection, do not rule out co-infections with other pathogens, and should not be used as the sole basis for treatment or other patient management decisions. Negative results must be combined with clinical observations, patient history, and epidemiological information. The expected result is Negative. Fact Sheet for Patients: SugarRoll.be Fact Sheet for Healthcare Providers: https://www.woods-mathews.com/ This test is not yet approved or cleared by the [place] FDA and  has been authorized for detection and/or diagnosis of SARS-CoV-2 by FDA under an Emergency Use Authorization (EUA). This EUA will remain  in effect (meaning this test can be used) for the duration of the COVID-19 declaration under Section 56 4(b)(1) of the Act, 21 U.S.C. section 360bbb-3(b)(1), unless the authorization is terminated or revoked sooner. Performed at [facility], [place] [address], [place],  [address]       Lab Results  Component Value Date   PTH 104 (H) 10/02/2019   CALCIUM 9.0 10/04/2019   PHOS 3.7 10/04/2019               Impression:  Patient is a 85 year old female with a past medical history of hypertension, chronic systolic heart failure, CKD stage V, asthma, GERD, hyperlipidemia, history of bifrontal subdural hemorrhage, dementia who was admitted to the hospital with chief complaint of acute on chronic CHF.  1)Renal  AKI secondary to ATN versus CKD progression Patient had CKD stage V Patient  has 6 stage V most likely secondary to hypertension Patient was in fluid overload and required renal replacement therapy. Patient first day of dialysis was October 02, 2019.  Though on that day patient only had 20 minutes of dialysis that she cut her treatment short.Thereafter patient completed her first full treatment on October 03, 2019 Patient permacath was placed on October 02, 2019. Patient currently being dialyzed   2)HTN Blood pressure is at goal   3)Anemia of chronic disease  HGb is not at goal (9--11) Patient is on Epogen  4) secondary hyperparathyroidism -CKD Mineral-Bone Disorder   Secondary Hyperparathyroidism  present .Marland Kitchen  Phosphorus at goal.   5) acute on chronic congestive heart failure. Patient responded well to renal placement therapy   6) electrolytes   sodium Normonatremic   potassium Normokalemic    7)Acid base Co2 at goal  8) infection prevention. Patient is hep B surface antigen negative on October 02, 2019. Patient is hep B core antibody negative on October 02 2019 Patient is hep C negative on April 2021 Patient is Covid negative on October 05, 2019 Patient is  QuantiFERON negative on October 02, 2019   Plan:  Patient currently being dialyzed Patient is tolerating treatment well     Angelisse Riso s Theador Hawthorne 10/06/2019, 8:01 AM

## 2019-10-06 NOTE — NC FL2 (Signed)
[place] LEVEL OF CARE SCREENING TOOL     IDENTIFICATION  Patient Name: Misty Berry Birthdate: 03/21/1935 Sex: female Admission Date (Current Location): 09/28/2019  [place] and [place] Number:  Engineering geologist and Address:  [facility], [address], [place], [place] [address]      Provider Number: [id number]  Attending Physician Name and Address:  Lorella Nimrod, MD  Relative Name and Phone Number:  Beckie Salts (Brother) [phone] [facility])    Current Level of Care: Hospital Recommended Level of Care: [place] Prior Approval Number:    Date Approved/Denied:   PASRR Number: [id number]  Discharge Plan: SNF    Current Diagnoses: Patient Active Problem List   Diagnosis Date Noted  . Swelling   . Hypertensive heart and renal disease   . Palliative care by specialist   . Advanced care planning/counseling discussion   . Acute on chronic diastolic heart failure ([place])   . CHF (congestive heart failure) ([place]) 09/28/2019  . Lymphedema 09/10/2019  . Iron deficiency anemia 08/07/2019  . Anemia in stage 4 chronic kidney disease ([place]) 08/07/2019  . Acute respiratory failure with hypoxia ([place]) 07/02/2019  . Anemia of chronic kidney failure, stage 5 ([place]) 07/02/2019  . Dyspnea   . Hypoxemia 06/27/2019  . Acute on chronic systolic (congestive) heart failure ([place]) 06/27/2019  . Acid reflux 08/27/2015  . Cellulitis 08/27/2015  . Abnormal bruising 08/27/2015  . Arthritis, degenerative 08/27/2015  . OP (osteoporosis) 08/27/2015  . Presence of artificial eye 08/27/2015  . History of surgical procedure 08/27/2015  . Hypertensive heart disease with stage 4 chronic kidney disease ([place]) 04/30/2015  . Laceration of forearm without foreign body 04/30/2015  . Venous insufficiency of leg 02/28/2015  . Gluteal tendinitis 01/22/2015  . Degenerative arthritis of lumbar spine 12/12/2014  . Back muscle spasm 11/14/2014  .  Degeneration of intervertebral disc of lumbar region 11/14/2014  . Neuritis or radiculitis due to rupture of lumbar intervertebral disc 11/14/2014  . Allergic rhinitis 08/31/2014  . Clinical depression 02/19/2014  . Gait instability 02/19/2014  . Benign hypertensive heart and kidney disease 10/31/2013  . Left heart failure ([place]) 10/31/2013  . Branch retinal vein occlusion 09/11/2013  . Asthma 06/20/2013  . Essential hypertension 06/20/2013  . Follow-up examination 04/30/2013  . Acute renal failure ([place]) 02/16/2013  . Fall 02/16/2013  . Closed fracture of part of neck of femur ([place]) 02/16/2013  . Cellulitis and abscess of leg 10/30/2012  . Cellulitis of lower extremity 10/30/2012  . CKD (chronic kidney disease), stage V ([place]) 10/30/2012  . Central retinal edema, cystoid 01/31/2012  . Impaired vision 12/17/2011    Orientation RESPIRATION BLADDER Height & Weight     Self, Place  O2 External catheter Weight: 88.1 kg Height:  5\' 6"  (167.6 cm)  BEHAVIORAL SYMPTOMS/MOOD NEUROLOGICAL BOWEL NUTRITION STATUS      Incontinent    AMBULATORY STATUS COMMUNICATION OF NEEDS Skin   Extensive Assist                           Personal Care Assistance Level of Assistance  Bathing, Feeding, Dressing, Total care Bathing Assistance: Maximum assistance Feeding assistance: Maximum assistance Dressing Assistance: Maximum assistance Total Care Assistance: Maximum assistance   Functional Limitations Info  Sight(Blind) Sight Info: Impaired(blind)        SPECIAL CARE FACTORS FREQUENCY  PT (By licensed PT), OT (By licensed OT)     PT Frequency: 5x  weekly OT Frequency: 5x weekly            Contractures      Additional Factors Info  Code Status DNR- Outpatient Palliative consult needed             Current Medications (10/06/2019):  This is the current hospital active medication list Current Facility-Administered Medications  Medication Dose Route Frequency Provider Last Rate  Last Admin  . 0.9 %  sodium chloride infusion  250 mL Intravenous PRN Schnier, Dolores Lory, MD      . 0.9 %  sodium chloride infusion  100 mL Intravenous PRN Schnier, Dolores Lory, MD      . 0.9 %  sodium chloride infusion  100 mL Intravenous PRN Schnier, Dolores Lory, MD      . acetaminophen (TYLENOL) tablet 650 mg  650 mg Oral Q4H PRN Schnier, Dolores Lory, MD   650 mg at 09/30/19 2120  . acidophilus (RISAQUAD) capsule 2 capsule  2 capsule Oral TID Katha Cabal, MD   2 capsule at 10/06/19 0831  . alteplase (CATHFLO ACTIVASE) injection 2 mg  2 mg Intracatheter Once PRN Schnier, Dolores Lory, MD      . aspirin EC tablet 81 mg  81 mg Oral Daily Schnier, Dolores Lory, MD   81 mg at 10/06/19 0830  . bumetanide (BUMEX) tablet 1 mg  1 mg Oral Daily Schnier, Dolores Lory, MD   1 mg at 10/06/19 0831  . busPIRone (BUSPAR) tablet 5 mg  5 mg Oral Daily Schnier, Dolores Lory, MD   5 mg at 10/06/19 0830  . calcium-vitamin D (OSCAL WITH D) 500-200 MG-UNIT per tablet 1 tablet  1 tablet Oral Daily Schnier, Dolores Lory, MD   1 tablet at 10/06/19 0830  . Chlorhexidine Gluconate Cloth 2 % PADS 6 each  6 each Topical Q0600 Schnier, Dolores Lory, MD   6 each at 10/06/19 0454  . docusate sodium (COLACE) capsule 100 mg  100 mg Oral BID Schnier, Dolores Lory, MD   100 mg at 10/06/19 0831  . epoetin alfa (EPOGEN) injection 4,000 Units  4,000 Units Intravenous Q M,W,F-HD Holley Raring, [place], MD   4,000 Units at 10/05/19 1708  . ferrous gluconate (FERGON) tablet 324 mg  324 mg Oral Daily Schnier, Dolores Lory, MD   324 mg at 10/06/19 0831  . heparin injection 1,000 Units  1,000 Units Dialysis PRN Katha Cabal, MD      . heparin injection 5,000 Units  5,000 Units Subcutaneous Q8H Schnier, Dolores Lory, MD   5,000 Units at 10/06/19 0093  . hydrALAZINE (APRESOLINE) tablet 25 mg  25 mg Oral Q8H Schnier, Dolores Lory, MD   25 mg at 10/06/19 [id number]  . HYDROmorphone (DILAUDID) injection 1 mg  1 mg Intravenous Once PRN Schnier, Dolores Lory, MD      . lidocaine (PF)  (XYLOCAINE) 1 % injection 5 mL  5 mL Intradermal PRN Schnier, Dolores Lory, MD      . lidocaine-prilocaine (EMLA) cream 1 application  1 application Topical PRN Schnier, Dolores Lory, MD      . losartan (COZAAR) tablet 50 mg  50 mg Oral Daily Lorella Nimrod, MD   50 mg at 10/06/19 0850  . metoprolol succinate (TOPROL-XL) 24 hr tablet 25 mg  25 mg Oral Daily Schnier, Dolores Lory, MD   25 mg at 10/06/19 0830  . omega-3 acid ethyl esters (LOVAZA) capsule 1,000 mg  1,000 mg Oral Daily Schnier, Dolores Lory, MD   1,000 mg at  10/06/19 0830  . ondansetron (ZOFRAN) injection 4 mg  4 mg Intravenous Q6H PRN Schnier, Dolores Lory, MD      . ondansetron [facility]) injection 4 mg  4 mg Intravenous Q6H PRN Schnier, Dolores Lory, MD      . pantoprazole (PROTONIX) EC tablet 40 mg  40 mg Oral Daily Schnier, Dolores Lory, MD   40 mg at 10/06/19 0829  . pentafluoroprop-tetrafluoroeth (GEBAUERS) aerosol 1 application  1 application Topical PRN Schnier, Dolores Lory, MD      . polyethylene glycol (MIRALAX / GLYCOLAX) packet 17 g  17 g Oral Daily Schnier, Dolores Lory, MD      . sertraline (ZOLOFT) tablet 200 mg  200 mg Oral Daily Schnier, Dolores Lory, MD   200 mg at 10/06/19 0830  . sodium chloride flush (NS) 0.9 % injection 3 mL  3 mL Intravenous Q12H Schnier, Dolores Lory, MD   3 mL at 10/05/19 2144  . sodium chloride flush (NS) 0.9 % injection 3 mL  3 mL Intravenous PRN Schnier, Dolores Lory, MD      . traMADol Veatrice Bourbon) tablet 25 mg  25 mg Oral Q6H PRN Schnier, Dolores Lory, MD   25 mg at 10/03/19 1552  . vitamin B-12 (CYANOCOBALAMIN) tablet 1,000 mcg  1,000 mcg Oral Daily Schnier, Dolores Lory, MD   1,000 mcg at 10/06/19 0830     Discharge Medications: Please see discharge summary for a list of discharge medications.  Relevant Imaging Results:  Relevant Lab Results:   Additional Information SSN:[id number]   Reason for Consult: bleeding from gluteal wounds Wound type: MASD with denudation and Stage 2 Pressure Injury; assessed by my [place] partner  10/01/19 Pressure Injury POA:NA  Dressing procedure/placement/frequency: Add calcium alginate for absorption of drainage for hemostasis.   Change daily.   Re consult if needed, will not follow at this time. Thanks  [place] MSN, RN,CWOCN, CNS, CWON-AP [phone]) _______________________________ Recommendations for Outpatient Follow-up:  1. Follow up with PCP in 1-2 weeks 2. Follow-up with nephrology 3. Follow-up with hemodialysis on Tuesday, Thursday and Saturday. 4. Please obtain BMP/CBC in one week 5. Please follow up on the following pending results: None _______________________________________________________  Marshell Garfinkel, RN

## 2019-10-06 NOTE — Consult Note (Signed)
[place] Nurse wound consult note Consultation was completed by review of records, images and assistance from the bedside nurse/clinical staff.    Reason for Consult:  Reason for Consult: bleeding from gluteal wounds Wound type: MASD with denudation and Stage 2 Pressure Injury; assessed by my [place] partner 10/01/19 Pressure Injury POA:NA  Dressing procedure/placement/frequency: Add calcium alginate for absorption of drainage for hemostasis.   Change daily.   Re consult if needed, will not follow at this time. Thanks  Kili Gracy [employer], RN,CWOCN, CNS, [place] [phone])

## 2019-10-06 NOTE — Progress Notes (Signed)
Patient is being discharged to [facility] room 30A. Report called to [facility]. EMS called per Levada Dy CM. IV removed, AVS printed and belongings packed. NT prepared patient for transport via EMS.

## 2019-10-08 ENCOUNTER — Telehealth: Payer: Self-pay | Admitting: Family

## 2019-10-09 NOTE — Progress Notes (Signed)
This encounter was created in error - please disregard.

## 2019-10-09 NOTE — Progress Notes (Signed)
Patient ID: Dwayna Kentner Velarde, female    DOB: 12/04/1934, 85 y.o.   MRN: [id number]  HPI  Ms Coonan is a 85 y/o female with a history of asthma, hyperlipidemia, HTN, CKD, GERD, hyperparathyroidism, DDD and chronic heart failure.   Echo report from 06/27/19 reviewed and showed an EF of 35-40%.  Admitted 09/28/19 due to acute on chronic heart failure with worsening renal function. Vascular, wound and palliative care consults obtained. Dialysis started due to worsening renal function. PT evaluation done. Discharged after 8 days.   She presents today for her initial visit with a chief complaint of minimal shortness of breath upon moderate exertion. She describes this as chronic in nature having been present for several years. She has associated easy bruising and pedal edema along with this. She denies any difficulty sleeping, dizziness, abdominal distention, palpitations, chest pain, cough or fatigue.   She says that she's currently not being weighed at [facility]. She currently has her right lower leg wrapped in [employer].   Past Medical History:  Diagnosis Date  . Asthma   . Bruises easily   . Cellulitis   . CHF (congestive heart failure) ([place])   . Cough   . DDD (degenerative disc disease), lumbar   . GERD (gastroesophageal reflux disease)   . HBP (high blood pressure)   . Hypercholesterolemia   . Hyperparathyroidism ([place])   . Osteoarthritis   . Osteoporosis   . Rash   . Renal disorder    kidney disease  . Swelling    Past Surgical History:  Procedure Laterality Date  . APPENDECTOMY    . BREAST REDUCTION SURGERY    . CHOLECYSTECTOMY    . DIALYSIS/PERMA CATHETER INSERTION N/A 10/02/2019   Procedure: DIALYSIS/PERMA CATHETER INSERTION;  Surgeon: Katha Cabal, MD;  Location: [facility];  Service: Cardiovascular;  Laterality: N/A;  . EYE SURGERY Left   . FEMUR SURGERY    . FOOT SURGERY Right   . GALLBLADDER SURGERY    . LIPECTOMY    . PAROTID GLAND TUMOR  EXCISION    . THYROID SURGERY    . TONSILLECTOMY    . TUBAL LIGATION    . VITRECTOMY     History reviewed. No pertinent family history. Social History   Tobacco Use  . Smoking status: Never Smoker  . Smokeless tobacco: Never Used  Substance Use Topics  . Alcohol use: No   Allergies  Allergen Reactions  . Ivp Dye [Iodinated Diagnostic Agents] Other (See Comments)  . Lactose   . Methylprednisolone Other (See Comments)    Reaction: Hallucinations and Psychosis  . Peanuts [Peanut Oil] Other (See Comments)    Swelling and vomitting  . Vancomycin Other (See Comments)    Reaction: unknown  . Wellbutrin [Bupropion] Other (See Comments)    Hallucinations   Prior to Admission medications   Medication Sig Start Date End Date Taking? Authorizing Provider  acidophilus (RISAQUAD) CAPS capsule Take 2 capsules by mouth 3 (three) times daily. 10/06/19  Yes Lorella Nimrod, MD  aspirin EC 81 MG tablet Take 81 mg by mouth daily.    Yes [provider]  bumetanide (BUMEX) 1 MG tablet Take 1 tablet (1 mg total) by mouth daily. 10/07/19  Yes Lorella Nimrod, MD  busPIRone (BUSPAR) 5 MG tablet Take 5 mg by mouth daily.  08/16/19  Yes [provider]  calcium-vitamin D (OSCAL WITH D) 500-200 MG-UNIT TABS tablet Take 1 tablet by mouth daily.  Yes [provider]  Cholecalciferol 50 MCG (2000 UT) CAPS Take 2,000 Units by mouth daily.   Yes [provider]  docusate sodium (COLACE) 100 MG capsule Take 1 capsule (100 mg total) by mouth 2 (two) times daily. 07/02/19  Yes Danford, Suann Larry, MD  esomeprazole (NEXIUM) 40 MG capsule Take 40 mg by mouth daily.    Yes [provider]  ferrous gluconate (FERGON) 324 MG tablet Take 324 mg by mouth daily.    Yes [provider]  hydrALAZINE (APRESOLINE) 25 MG tablet Take 1 tablet (25 mg total) by mouth every 8 (eight) hours. Patient taking differently: Take 25 mg by mouth every 8 (eight) hours. Patient reports  only taking in AM 10/06/19  Yes Lorella Nimrod, MD  losartan (COZAAR) 50 MG tablet Take 1 tablet (50 mg total) by mouth daily. 10/07/19  Yes Lorella Nimrod, MD  metoprolol succinate (TOPROL-XL) 25 MG 24 hr tablet Take 1 tablet (25 mg total) by mouth daily. 10/07/19  Yes Lorella Nimrod, MD  Multiple Vitamins-Minerals (CENTRUM SILVER 50+WOMEN) TABS Take 1 tablet by mouth daily.   Yes [provider]  Omega-3 Fatty Acids (FISH OIL) 1000 MG CAPS Take 4,000 mg by mouth daily.    Yes [provider]  Skin Protectants, Misc. (BAZA PROTECT EX) Apply 1 application topically See admin instructions. Apply to buttocks topically every shift after each cleaning.   Yes [provider]  traMADol (ULTRAM) 50 MG tablet Take 1 tablet (50 mg total) by mouth every 6 (six) hours as needed for moderate pain. 07/02/19  Yes Danford, Suann Larry, MD  ZOLOFT 100 MG tablet Take 100 mg by mouth daily.  06/07/15  Yes [provider]     Review of Systems  Constitutional: Negative for appetite change and fatigue.  HENT: Negative for congestion, postnasal drip and sneezing.   Eyes: Negative.   Respiratory: Positive for shortness of breath. Negative for cough.   Cardiovascular: Positive for leg swelling. Negative for chest pain and palpitations.  Gastrointestinal: Negative for abdominal distention and abdominal pain.  Endocrine: Negative.   Genitourinary: Negative.   Musculoskeletal: Negative for back pain and neck pain.  Skin: Negative.   Allergic/Immunologic: Negative.   Neurological: Negative for dizziness and light-headedness.  Hematological: Negative for adenopathy. Bruises/bleeds easily.  Psychiatric/Behavioral: Negative for dysphoric mood and sleep disturbance (sleeping on 1 pillow). The patient is not nervous/anxious.     Vitals:   10/10/19 0853  BP: (!) 134/54  Pulse: 73  Resp: 15  SpO2: 92%  Weight: 175 lb (79.4 kg)  Height: 5\' 2"  (1.575 m)   Wt Readings from Last 3  Encounters:  10/10/19 175 lb (79.4 kg)  10/06/19 194 lb 3.6 oz (88.1 kg)  07/02/19 192 lb 7.4 oz (87.3 kg)   Lab Results  Component Value Date   CREATININE 2.61 (H) 10/04/2019   CREATININE 3.26 (H) 10/03/2019   CREATININE 3.24 (H) 10/02/2019    Physical Exam Vitals and nursing note reviewed.  Constitutional:      Appearance: Normal appearance.  HENT:     Head: Normocephalic and atraumatic.  Cardiovascular:     Rate and Rhythm: Normal rate and regular rhythm.  Pulmonary:     Effort: Pulmonary effort is normal. No respiratory distress.     Breath sounds: No wheezing or rales.  Abdominal:     General: There is no distension.     Palpations: Abdomen is soft.  Musculoskeletal:        General:  No tenderness.     Cervical back: Normal range of motion and neck supple.     Right lower leg: Edema (wrapped in [employer]) present.     Left lower leg: Edema (2+ pitting ) present.  Skin:    General: Skin is warm and dry.  Neurological:     General: No focal deficit present.     Mental Status: She is alert and oriented to person, place, and time.  Psychiatric:        Mood and Affect: Mood normal.        Behavior: Behavior normal.        Thought Content: Thought content normal.     Assessment & Plan:  1: Chronic heart failure with reduced ejection fraction- - NYHA class II - euvolemic today - not being weighed at [facility] so it was written to weigh daily and call for an overnight weight gain of >2 pounds or a weekly weight gain of >5 pounds - not adding salt to her food and she says that she hasn't added salt "in years"; reminded her to look at food labels once she gets home so that she can follow a low sodium diet - right lower leg wrapped in [employer] boot; encouraged her to elevate her legs when sitting for long periods of time - renal function precludes changing losartan to entresto - BNP 10/01/19 was 690.0 - PharmD reconciled medications with the patient  2: HTN- - BP  looks good today - saw PCP ([facility]) 06/20/19 - BMP 10/04/19 reviewed and showed sodium 142, potassium 4.2, creatinine 2.61 and GFR 16  3: ESRD- - saw nephrology Smith Mince) 09/06/19 - patient has subclavian catheter placed for dialysis but patient says that she hasn't started it yet although D/C summary says dialysis on Tues, Thurs and Saturday   Facility medication list was reviewed. Patient does seem a little confused as she says that no nurse checks on her and that she takes all her medications on her own.   Return in 2 months or sooner for any questions/problems before then.

## 2019-10-10 ENCOUNTER — Other Ambulatory Visit: Payer: Self-pay

## 2019-10-10 ENCOUNTER — Encounter: Payer: Self-pay | Admitting: Family

## 2019-10-10 ENCOUNTER — Ambulatory Visit: Payer: No Typology Code available for payment source | Attending: Family | Admitting: Family

## 2019-10-10 VITALS — BP 134/54 | HR 73 | Resp 15 | Ht 62.0 in | Wt 175.0 lb

## 2019-10-10 DIAGNOSIS — Z79899 Other long term (current) drug therapy: Secondary | ICD-10-CM | POA: Insufficient documentation

## 2019-10-10 DIAGNOSIS — Z7982 Long term (current) use of aspirin: Secondary | ICD-10-CM | POA: Diagnosis not present

## 2019-10-10 DIAGNOSIS — N186 End stage renal disease: Secondary | ICD-10-CM | POA: Diagnosis not present

## 2019-10-10 DIAGNOSIS — J45909 Unspecified asthma, uncomplicated: Secondary | ICD-10-CM | POA: Diagnosis not present

## 2019-10-10 DIAGNOSIS — E213 Hyperparathyroidism, unspecified: Secondary | ICD-10-CM | POA: Diagnosis not present

## 2019-10-10 DIAGNOSIS — Z992 Dependence on renal dialysis: Secondary | ICD-10-CM | POA: Insufficient documentation

## 2019-10-10 DIAGNOSIS — I132 Hypertensive heart and chronic kidney disease with heart failure and with stage 5 chronic kidney disease, or end stage renal disease: Secondary | ICD-10-CM | POA: Diagnosis not present

## 2019-10-10 DIAGNOSIS — K219 Gastro-esophageal reflux disease without esophagitis: Secondary | ICD-10-CM | POA: Diagnosis not present

## 2019-10-10 DIAGNOSIS — E7800 Pure hypercholesterolemia, unspecified: Secondary | ICD-10-CM | POA: Diagnosis not present

## 2019-10-10 DIAGNOSIS — Z881 Allergy status to other antibiotic agents status: Secondary | ICD-10-CM | POA: Diagnosis not present

## 2019-10-10 DIAGNOSIS — I10 Essential (primary) hypertension: Secondary | ICD-10-CM

## 2019-10-10 DIAGNOSIS — I5022 Chronic systolic (congestive) heart failure: Secondary | ICD-10-CM | POA: Diagnosis present

## 2019-10-10 NOTE — Patient Instructions (Addendum)
Begin weighing daily and call for an overnight weight gain of > 2 pounds or a weekly weight gain of >5 pounds. 

## 2019-10-24 ENCOUNTER — Non-Acute Institutional Stay: Payer: Medicare Other | Admitting: Nurse Practitioner

## 2019-10-24 ENCOUNTER — Other Ambulatory Visit: Payer: Self-pay

## 2019-10-24 ENCOUNTER — Encounter: Payer: Self-pay | Admitting: Nurse Practitioner

## 2019-10-24 VITALS — BP 138/76 | HR 86 | Temp 97.3°F | Resp 18 | Wt 202.0 lb

## 2019-10-24 DIAGNOSIS — Z515 Encounter for palliative care: Secondary | ICD-10-CM

## 2019-10-24 DIAGNOSIS — I5040 Unspecified combined systolic (congestive) and diastolic (congestive) heart failure: Secondary | ICD-10-CM

## 2019-10-24 NOTE — Progress Notes (Addendum)
[facility] Consult Note Telephone: [phone]  Fax: [phone]  PATIENT NAME: Breezie Micucci Reichenbach DOB: Jan 03, 1935 MRN: [id number]  PRIMARY CARE PROVIDER:   Nelia Shi, MD  REFERRING PROVIDER:  Dr Hodges/[facility] RESPONSIBLE PARTY:   Beckie Salts brother [phone]  I was asked by Dr Nyra Capes to see Ms. Clauss for Palliative care consult for goals of care  RECOMMENDATIONS and PLAN:  1. ACP: DNR placed in [place]   2. Palliative care encounter; Palliative medicine team will continue to support patient, patient's family, and medical team. Visit consisted of counseling and education dealing with the complex and emotionally intense issues of symptom management and palliative care in the setting of serious and potentially life-threatening illness I spent 90 minutes providing this consultation,  from 10:30am.  More than 50% of the time in this consultation was spent coordinating communication.   HISTORY OF PRESENT ILLNESS:  Gilma Bessette Mesquita is a 85 y.o. year old female with multiple medical problems including Congestive heart failure, dementia, degenerative disc disease, osteoarthritis, osteoporosis, chronic kidney disease, hypertension, venous insufficiency, anemia, vitrectomy, tubal ligation, tonsillectomy, thyroid surgery, parotid gland tumor excision, lipectomy, right foot surgery, cholecystectomy, breast reduction surgery, appendectomy, dialysis permacath placed, gallbladder surgery. Hospitalized 1 / 12 / 2021to 1 / 18 / 2021 for shortness of breath with work up showing bilateral opacities. Acute on chronic systolic and diastolic congestive heart failure with acute hypoxic respiratory failure. CT with small effusions. Echo repeated EF is down to 35 to 40 %started on diuretics. Creatinine  increase to 3.8 due to hypertension and diuresing held. Required one unit to be transfused. Cardiology with Nephrology consulted.  She does have sundowning confusion at night. Elevated troponin flat and low, no angina doubt ACS. Hospitalized 4/ 16 / 2021 to 4 / 24 / 2021 for unable to walk because she becomes short of breath. Worsening renal function start on dialysis. [facility] palliative care during hospitalization her documentation met with Ms Lender and her partner Remo Lipps. She was living at [facility] memory care unit requiring a DL assistance. Dementia has worsening with minimal ambulatory, wheelchair dependent. Beckie Salts when is designated health care power of attorney her brother. Ms. Olberding was discharge to [facility] Rehab in [facility] where she currently resides. Ms. Yearick does require assistance with ADLs, transferring, toileting. Ms. Hund does feed herself. Staff endorses her appetite is fair. Staff endorses no other concerns. At present Ms Hunnell is lying in bed. Ms. Dotts is interacting with her roommate, appears comfortable though debilitated. I visited and observed Ms. Hauter. We talked about purpose of palliative care visit. We talked about symptoms of pain and shortness of breath Ms. Lufkin denies. We talked about her appetite. We talked about her functional-level. We talked about her work with therapy. We talked about residing at short-term rehab. Ms Jordan was cooperative with the assessment. Attempt to ask Ms. Nowling questions about Life review though Ms Storti was not interested in talking a present time she is trying to locate her cell phone. Attempted to assist to help her find it but not successful, asked nursing to assist. We talked about role palliative care and plan of care. Discuss with Ms Cothern will follow up in one week if needed or so should she declined. I have attempted to call Jenny Reichmann, Ms Group brother, will continue for update on palliative care. I updated nursing staff and I knew current changes to goals our plan of care at  this time. Palliative Care was asked to help address goals of care.     I called Ms Irisha Grandmaison, brother health care power-of-attorney. We talked about Ms Conyer residing at [facility]. Mr. Evalee Jefferson talked at length about past medical history, chronic disease progression, recent hospitalizations. We talked at length about and stage renal disease requiring hemodialysis. We talked about dementia. We talked about Ms. Mehlhaff functional level. We talked about the work she has been doing with therapy. We talked about symptoms. We talked about medical goals of care. At present time she will continue with hemodialysis to think about it over the next month to see if this is a good fit for her. We talked about transition back to [facility]. We talked about today's visit with Ms Castrogiovanni. We talked about Mr. Corwin living in [place],  often checking very active in care. We talked about rehab for a few more weeks to see where her functional level is. We talked about her current functional level. We talked about role of palliative care and plan of care. We talked about follow-up visit in 2 days if needed her sooner should she declined. Therapeutic listening and emotional support provided. Contact information. Questions answered to satisfaction.   CODE STATUS: DNR  PPS: 40% HOSPICE ELIGIBILITY/DIAGNOSIS: TBD  PAST MEDICAL HISTORY:  Past Medical History:  Diagnosis Date   Asthma    Bruises easily    Cellulitis    CHF (congestive heart failure) (HCC)    Cough    DDD (degenerative disc disease), lumbar    GERD (gastroesophageal reflux disease)    HBP (high blood pressure)    Hypercholesterolemia    Hyperparathyroidism (HCC)    Osteoarthritis    Osteoporosis    Rash    Renal disorder    kidney disease   Swelling     SOCIAL HX:  Social History   Tobacco Use   Smoking status: Never Smoker   Smokeless tobacco: Never Used  Substance Use Topics   Alcohol use: No    ALLERGIES:  Allergies  Allergen Reactions   Ivp Dye [Iodinated  Diagnostic Agents] Other (See Comments)   Lactose    Methylprednisolone Other (See Comments)    Reaction: Hallucinations and Psychosis   Peanuts [Peanut Oil] Other (See Comments)    Swelling and vomitting   Vancomycin Other (See Comments)    Reaction: unknown   Wellbutrin [Bupropion] Other (See Comments)    Hallucinations     PERTINENT MEDICATIONS:  Outpatient Encounter Medications as of 10/24/2019  Medication Sig   acidophilus (RISAQUAD) CAPS capsule Take 2 capsules by mouth 3 (three) times daily.   aspirin EC 81 MG tablet Take 81 mg by mouth daily.    bumetanide (BUMEX) 1 MG tablet Take 1 tablet (1 mg total) by mouth daily.   busPIRone (BUSPAR) 5 MG tablet Take 5 mg by mouth daily.    calcium-vitamin D (OSCAL WITH D) 500-200 MG-UNIT TABS tablet Take 1 tablet by mouth daily.    Cholecalciferol 50 MCG (2000 UT) CAPS Take 2,000 Units by mouth daily.   docusate sodium (COLACE) 100 MG capsule Take 1 capsule (100 mg total) by mouth 2 (two) times daily.   esomeprazole (NEXIUM) 40 MG capsule Take 40 mg by mouth daily.    ferrous gluconate (FERGON) 324 MG tablet Take 324 mg by mouth daily.    hydrALAZINE (APRESOLINE) 25 MG tablet Take 1 tablet (25 mg total) by mouth every 8 (eight) hours. (Patient taking differently: Take  25 mg by mouth every 8 (eight) hours. Patient reports only taking in AM)   losartan (COZAAR) 50 MG tablet Take 1 tablet (50 mg total) by mouth daily.   metoprolol succinate (TOPROL-XL) 25 MG 24 hr tablet Take 1 tablet (25 mg total) by mouth daily.   Multiple Vitamins-Minerals (CENTRUM SILVER 50+WOMEN) TABS Take 1 tablet by mouth daily.   Omega-3 Fatty Acids (FISH OIL) 1000 MG CAPS Take 4,000 mg by mouth daily.    Skin Protectants, Misc. (BAZA PROTECT EX) Apply 1 application topically See admin instructions. Apply to buttocks topically every shift after each cleaning.   traMADol (ULTRAM) 50 MG tablet Take 1 tablet (50 mg total) by mouth every 6 (six) hours  as needed for moderate pain.   ZOLOFT 100 MG tablet Take 100 mg by mouth daily.    No facility-administered encounter medications on file as of 10/24/2019.    PHYSICAL EXAM:   General: chronically ill debilitated female Cardiovascular: regular rate and rhythm Pulmonary: clear ant fields; decreased bases Neurological: generalized weakness  Nell Gales Ihor Gully, NP

## 2019-11-02 ENCOUNTER — Other Ambulatory Visit: Payer: Self-pay

## 2019-11-02 ENCOUNTER — Encounter: Payer: Self-pay | Admitting: Nurse Practitioner

## 2019-11-02 ENCOUNTER — Non-Acute Institutional Stay: Payer: Medicare Other | Admitting: Nurse Practitioner

## 2019-11-02 VITALS — BP 128/72 | HR 84 | Temp 97.0°F | Resp 18 | Wt 206.0 lb

## 2019-11-02 DIAGNOSIS — I5040 Unspecified combined systolic (congestive) and diastolic (congestive) heart failure: Secondary | ICD-10-CM

## 2019-11-02 DIAGNOSIS — Z515 Encounter for palliative care: Secondary | ICD-10-CM

## 2019-11-02 NOTE — Progress Notes (Signed)
[facility] Consult Note Telephone: [phone]  Fax: [phone]  PATIENT NAME: Misty Berry DOB: 1935/02/18 MRN: [id number] PRIMARY CARE PROVIDER:   Nelia Shi, MD  REFERRING PROVIDER:  Dr Hodges/[facility] RESPONSIBLE PARTY:   Beckie Salts brother [phone]  I was asked by Dr Nyra Capes to see Misty Berry for Palliative care consult for goals of care  RECOMMENDATIONS and PLAN: 1.ACP: DNR in [place]; treat what is treatable  2.Palliative care encounter; Palliative medicine team will continue to support patient, patient's family, and medical team. Visit consisted of counseling and education dealing with the complex and emotionally intense issues of symptom management and palliative care in the setting of serious and potentially life-threatening illness  I spent 50 minutes providing this consultation,  Started at 11:00am. More than 50% of the time in this consultation was spent coordinating communication.   HISTORY OF PRESENT ILLNESS:  Misty Berry is a 85 y.o. year old female with multiple medical problems including Misty Berry does continue to reside at [facility]. Misty Berry does transfer to the wheelchair where she is able to sit up. Misty Berry requires assist with ADLs. Misty Berry does feed herself and appetite has been improving. Misty. Berry is able to verbalize her knee. Misty Berry does continue to go to dialysis and tolerating well. Misty Berry continues to work with therapy with improving results. At present Misty Berry is lying sitting up in bed getting ready to wash her face. We talked about purpose of palliative care visit. We talked about how she was feeling. Misty. Berry endorses that she is doing better. We talked about symptoms but she denies. We talked about appetite which is better. We talked about hemodialysis. Misty Berry endorses  that dialysis is a challenge but she is doing it. We talked about when she will complete therapy and return to [facility]. Misty. Berry endorses she believes another week or two of therapy. We talked about medical goals for therapy. We talked about role of palliative care and plan of care. Discuss will follow up next week to see when discharge plan back to [facility]. I called Misty Berry brother, Beckie Salts. Clinical update discuss. We talked about visit with Misty Berry. We talked about symptoms, progression with therapy, planning discharge day in the next week or two. We talked about palliative care to continue to follow and  Beckie Salts in agreement. Medical goals reviewed. Therapeutic listening and emotional support provided. Contact information. Questions answered to satisfaction. I have updated nursing staff many changes to current goals are plan of care. Notified Palliative Administration for a request to continue to palliative care to follow when returns to [facility] was asked to help to continue to  address goals of care.   CODE STATUS: DNR  PPS: 50% HOSPICE ELIGIBILITY/DIAGNOSIS: TBD  PAST MEDICAL HISTORY:  Past Medical History:  Diagnosis Date  . Asthma   . Bruises easily   . Cellulitis   . CHF (congestive heart failure) ([place])   . Cough   . DDD (degenerative disc disease), lumbar   . GERD (gastroesophageal reflux disease)   . HBP (high blood pressure)   . Hypercholesterolemia   . Hyperparathyroidism ([place])   . Osteoarthritis   . Osteoporosis   . Rash   . Renal disorder    kidney disease  . Swelling     SOCIAL HX:  Social  History   Tobacco Use  . Smoking status: Never Smoker  . Smokeless tobacco: Never Used  Substance Use Topics  . Alcohol use: No    ALLERGIES:  Allergies  Allergen Reactions  . Ivp Dye [Iodinated Diagnostic Agents] Other (See Comments)  . Lactose   . Methylprednisolone Other (See Comments)    Reaction:  Hallucinations and Psychosis  . Peanuts [Peanut Oil] Other (See Comments)    Swelling and vomitting  . Vancomycin Other (See Comments)    Reaction: unknown  . Wellbutrin [Bupropion] Other (See Comments)    Hallucinations     PERTINENT MEDICATIONS:  Outpatient Encounter Medications as of 11/02/2019  Medication Sig  . acidophilus (RISAQUAD) CAPS capsule Take 2 capsules by mouth 3 (three) times daily.  Marland Kitchen aspirin EC 81 MG tablet Take 81 mg by mouth daily.   . bumetanide (BUMEX) 1 MG tablet Take 1 tablet (1 mg total) by mouth daily.  . busPIRone (BUSPAR) 5 MG tablet Take 5 mg by mouth daily.   . calcium-vitamin D (OSCAL WITH D) 500-200 MG-UNIT TABS tablet Take 1 tablet by mouth daily.   . Cholecalciferol 50 MCG (2000 UT) CAPS Take 2,000 Units by mouth daily.  Marland Kitchen docusate sodium (COLACE) 100 MG capsule Take 1 capsule (100 mg total) by mouth 2 (two) times daily.  Marland Kitchen esomeprazole (NEXIUM) 40 MG capsule Take 40 mg by mouth daily.   . ferrous gluconate (FERGON) 324 MG tablet Take 324 mg by mouth daily.   . hydrALAZINE (APRESOLINE) 25 MG tablet Take 1 tablet (25 mg total) by mouth every 8 (eight) hours. (Patient taking differently: Take 25 mg by mouth every 8 (eight) hours. Patient reports only taking in AM)  . losartan (COZAAR) 50 MG tablet Take 1 tablet (50 mg total) by mouth daily.  . metoprolol succinate (TOPROL-XL) 25 MG 24 hr tablet Take 1 tablet (25 mg total) by mouth daily.  . Multiple Vitamins-Minerals (CENTRUM SILVER 50+WOMEN) TABS Take 1 tablet by mouth daily.  . Omega-3 Fatty Acids (FISH OIL) 1000 MG CAPS Take 4,000 mg by mouth daily.   . Skin Protectants, Misc. (BAZA PROTECT EX) Apply 1 application topically See admin instructions. Apply to buttocks topically every shift after each cleaning.  . traMADol (ULTRAM) 50 MG tablet Take 1 tablet (50 mg total) by mouth every 6 (six) hours as needed for moderate pain.  Marland Kitchen ZOLOFT 100 MG tablet Take 100 mg by mouth daily.    No facility-administered  encounter medications on file as of 11/02/2019.    PHYSICAL EXAM:   General: NAD, pleasant, female Cardiovascular: regular rate and rhythm Pulmonary: clear ant fields Neurological: generalized weakness  Gayna Braddy Ihor Gully, NP

## 2019-11-28 NOTE — Progress Notes (Deleted)
Patient ID: Misty Berry, female    DOB: [birth year], 85 y.o.   MRN: [id number]  HPI  Ms Deschepper is a 85 y/o female with a history of asthma, hyperlipidemia, HTN, CKD, GERD, hyperparathyroidism, DDD and chronic heart failure.   Echo report from 06/27/19 reviewed and showed an EF of 35-40%.  Admitted 09/28/19 due to acute on chronic heart failure with worsening renal function. Vascular, wound and palliative care consults obtained. Dialysis started due to worsening renal function. PT evaluation done. Discharged after 8 days.   She presents today for a follow-up visit with a chief complaint of   Past Medical History:  Diagnosis Date  . Asthma   . Bruises easily   . Cellulitis   . CHF (congestive heart failure) ([place])   . Cough   . DDD (degenerative disc disease), lumbar   . GERD (gastroesophageal reflux disease)   . HBP (high blood pressure)   . Hypercholesterolemia   . Hyperparathyroidism ([place])   . Osteoarthritis   . Osteoporosis   . Rash   . Renal disorder    kidney disease  . Swelling    Past Surgical History:  Procedure Laterality Date  . APPENDECTOMY    . BREAST REDUCTION SURGERY    . CHOLECYSTECTOMY    . DIALYSIS/PERMA CATHETER INSERTION N/A 10/02/2019   Procedure: DIALYSIS/PERMA CATHETER INSERTION;  Surgeon: Katha Cabal, MD;  Location: [facility];  Service: Cardiovascular;  Laterality: N/A;  . EYE SURGERY Left   . FEMUR SURGERY    . FOOT SURGERY Right   . GALLBLADDER SURGERY    . LIPECTOMY    . PAROTID GLAND TUMOR EXCISION    . THYROID SURGERY    . TONSILLECTOMY    . TUBAL LIGATION    . VITRECTOMY     No family history on file. Social History   Tobacco Use  . Smoking status: Never Smoker  . Smokeless tobacco: Never Used  Substance Use Topics  . Alcohol use: No   Allergies  Allergen Reactions  . Ivp Dye [Iodinated Diagnostic Agents] Other (See Comments)  . Lactose   . Methylprednisolone Other (See Comments)    Reaction: Hallucinations  and Psychosis  . Peanuts [Peanut Oil] Other (See Comments)    Swelling and vomitting  . Vancomycin Other (See Comments)    Reaction: unknown  . Wellbutrin [Bupropion] Other (See Comments)    Hallucinations      Review of Systems  Constitutional: Negative for appetite change and fatigue.  HENT: Negative for congestion, postnasal drip and sneezing.   Eyes: Negative.   Respiratory: Positive for shortness of breath. Negative for cough.   Cardiovascular: Positive for leg swelling. Negative for chest pain and palpitations.  Gastrointestinal: Negative for abdominal distention and abdominal pain.  Endocrine: Negative.   Genitourinary: Negative.   Musculoskeletal: Negative for back pain and neck pain.  Skin: Negative.   Allergic/Immunologic: Negative.   Neurological: Negative for dizziness and light-headedness.  Hematological: Negative for adenopathy. Bruises/bleeds easily.  Psychiatric/Behavioral: Negative for dysphoric mood and sleep disturbance (sleeping on 1 pillow). The patient is not nervous/anxious.       Physical Exam Vitals and nursing note reviewed.  Constitutional:      Appearance: Normal appearance.  HENT:     Head: Normocephalic and atraumatic.  Cardiovascular:     Rate and Rhythm: Normal rate and regular rhythm.  Pulmonary:     Effort: Pulmonary effort is normal. No respiratory distress.     Breath sounds:  No wheezing or rales.  Abdominal:     General: There is no distension.     Palpations: Abdomen is soft.  Musculoskeletal:        General: No tenderness.     Cervical back: Normal range of motion and neck supple.     Right lower leg: Edema (wrapped in [employer]) present.     Left lower leg: Edema (2+ pitting ) present.  Skin:    General: Skin is warm and dry.  Neurological:     General: No focal deficit present.     Mental Status: She is alert and oriented to person, place, and time.  Psychiatric:        Mood and Affect: Mood normal.        Behavior:  Behavior normal.        Thought Content: Thought content normal.     Assessment & Plan:  1: Chronic heart failure with reduced ejection fraction- - NYHA class II - euvolemic today - not being [facility] at [facility] so it was written to weigh daily and call for an overnight weight gain of >2 pounds or a weekly weight gain of >5 pounds - weight 175 pounds from last visit 2 months ago - not adding salt to her food and she says that she hasn't added salt "in years"; reminded her to look at food labels once she gets home so that she can follow a low sodium diet - right lower leg wrapped in [place] boot; encouraged her to elevate her legs when sitting for long periods of time - renal function precludes changing losartan to entresto - BNP 10/01/19 was 690.0  2: HTN- - BP  - saw PCP ([facility]) 06/20/19 - BMP 10/04/19 reviewed and showed sodium 142, potassium 4.2, creatinine 2.61 and GFR 16  3: ESRD- - saw nephrology Smith Mince) 09/06/19 - patient has subclavian catheter placed for dialysis but patient says that she hasn't started it yet although D/C summary says dialysis on Tues, Thurs and Saturday   Facility medication list was reviewed. Patient does seem a little confused as she says that no nurse checks on her and that she takes all her medications on her own.

## 2019-11-29 ENCOUNTER — Ambulatory Visit: Payer: Medicare Other | Admitting: Family

## 2019-11-29 ENCOUNTER — Telehealth: Payer: Self-pay | Admitting: Family

## 2019-11-29 NOTE — Telephone Encounter (Signed)
Patient did not show for her [facility] Failure [facility] appointment on 11/29/19. Will attempt to reschedule.

## 2019-12-03 ENCOUNTER — Ambulatory Visit (INDEPENDENT_AMBULATORY_CARE_PROVIDER_SITE_OTHER): Payer: Medicare Other | Admitting: Vascular Surgery

## 2019-12-03 ENCOUNTER — Encounter (INDEPENDENT_AMBULATORY_CARE_PROVIDER_SITE_OTHER): Payer: Self-pay | Admitting: Vascular Surgery

## 2019-12-03 ENCOUNTER — Other Ambulatory Visit: Payer: Self-pay

## 2019-12-03 VITALS — BP 102/61 | HR 76 | Ht 62.0 in | Wt 180.0 lb

## 2019-12-03 DIAGNOSIS — K219 Gastro-esophageal reflux disease without esophagitis: Secondary | ICD-10-CM

## 2019-12-03 DIAGNOSIS — I890 Lymphedema, not elsewhere classified: Secondary | ICD-10-CM

## 2019-12-03 DIAGNOSIS — I10 Essential (primary) hypertension: Secondary | ICD-10-CM | POA: Diagnosis not present

## 2019-12-03 DIAGNOSIS — N185 Chronic kidney disease, stage 5: Secondary | ICD-10-CM

## 2019-12-03 DIAGNOSIS — I5040 Unspecified combined systolic (congestive) and diastolic (congestive) heart failure: Secondary | ICD-10-CM | POA: Diagnosis not present

## 2019-12-03 NOTE — Progress Notes (Signed)
MRN : [id number]  Misty Berry is a 85 y.o. (25-Nov-1934) female who presents with chief complaint of  Chief Complaint  Patient presents with  . Follow-up    Discuss acess placement   .  History of Present Illness:   The patient is seen for evaluation for dialysis access. The patient has chronic renal insufficiency stage V secondary to hypertension and diabetes. The patient's most recent creatinine clearance is less than 20. The patient volume status has not yet become an issue, she does have CHF but is currently well controled. Patient's blood pressures been relatively well controlled. There are mild uremic symptoms which appear to be relatively well tolerated at this time. The patient is right-handed.  The patient has been considering the various methods of dialysis and wishes to proceed with hemodialysis and therefore creation of AV access.  The patient denies amaurosis fugax or recent TIA symptoms. There are no recent neurological changes noted. The patient denies claudication symptoms or rest pain symptoms. The patient denies history of DVT, PE or superficial thrombophlebitis. The patient denies recent episodes of angina or shortness of breath.   Vein mapping does not reveal superficial veins that are adequate for fistula creation  No outpatient medications have been marked as taking for the 12/03/19 encounter (Office Visit) with Delana Meyer, Dolores Lory, MD.    Past Medical History:  Diagnosis Date  . Asthma   . Bruises easily   . Cellulitis   . CHF (congestive heart failure) ([place])   . Cough   . DDD (degenerative disc disease), lumbar   . GERD (gastroesophageal reflux disease)   . HBP (high blood pressure)   . Hypercholesterolemia   . Hyperparathyroidism ([place])   . Osteoarthritis   . Osteoporosis   . Rash   . Renal disorder    kidney disease  . Swelling     Past Surgical History:  Procedure Laterality Date  . APPENDECTOMY    . BREAST REDUCTION SURGERY    .  CHOLECYSTECTOMY    . DIALYSIS/PERMA CATHETER INSERTION N/A 10/02/2019   Procedure: DIALYSIS/PERMA CATHETER INSERTION;  Surgeon: Katha Cabal, MD;  Location: [facility];  Service: Cardiovascular;  Laterality: N/A;  . EYE SURGERY Left   . FEMUR SURGERY    . FOOT SURGERY Right   . GALLBLADDER SURGERY    . LIPECTOMY    . PAROTID GLAND TUMOR EXCISION    . THYROID SURGERY    . TONSILLECTOMY    . TUBAL LIGATION    . VITRECTOMY      Social History Social History   Tobacco Use  . Smoking status: Never Smoker  . Smokeless tobacco: Never Used  Vaping Use  . Vaping Use: Never used  Substance Use Topics  . Alcohol use: No  . Drug use: No    Family History No family history on file.  Allergies  Allergen Reactions  . Ivp Dye [Iodinated Diagnostic Agents] Other (See Comments)  . Lactose   . Methylprednisolone Other (See Comments)    Reaction: Hallucinations and Psychosis  . Peanuts [Peanut Oil] Other (See Comments)    Swelling and vomitting  . Vancomycin Other (See Comments)    Reaction: unknown  . Wellbutrin [Bupropion] Other (See Comments)    Hallucinations     REVIEW OF SYSTEMS (Negative unless checked)  Constitutional: [] Weight loss  [] Fever  [] Chills Cardiac: [] Chest pain   [] Chest pressure   [] Palpitations   [] Shortness of breath when laying flat   [x] Shortness  of breath with exertion. Vascular:  [] Pain in legs with walking   [] Pain in legs at rest  [] History of DVT   [] Phlebitis   [x] Swelling in legs   [] Varicose veins   [] Non-healing ulcers Pulmonary:   [] Uses home oxygen   [] Productive cough   [] Hemoptysis   [] Wheeze  [] COPD   [] Asthma Neurologic:  [] Dizziness   [] Seizures   [] History of stroke   [] History of TIA  [] Aphasia   [] Vissual changes   [] Weakness or numbness in arm   [x] Weakness or numbness in leg Musculoskeletal:   [] Joint swelling   [] Joint pain   [] Low back pain Hematologic:  [] Easy bruising  [] Easy bleeding   [] Hypercoagulable state    [] Anemic Gastrointestinal:  [] Diarrhea   [] Vomiting  [x] Gastroesophageal reflux/heartburn   [] Difficulty swallowing. Genitourinary:  [x] Chronic kidney disease   [] Difficult urination  [] Frequent urination   [] Blood in urine Skin:  [] Rashes   [] Ulcers  Psychological:  [] History of anxiety   []  History of major depression.  Physical Examination  Vitals:   12/03/19 1355  BP: 102/61  Pulse: 76  Weight: 180 lb (81.6 kg)  Height: 5\' 2"  (1.575 m)   Body mass index is 32.92 kg/m. Gen: WD/WN, NAD seen in a wheelchair Head: [place]/AT, No temporalis wasting.  Ear/Nose/Throat: Hearing grossly intact, nares w/o erythema or drainage Eyes: pupils not equal, EOM not symetric, sclera nonicteric.  Neck: Supple, no large masses.   Pulmonary:  Good air movement, no audible wheezing bilaterally, no use of accessory muscles.  Cardiac: RRR, no JVD Vascular: multiple bruises of both arms especially the forearms, skin is quite thin Vessel Right Left  Radial Palpable Palpable  Brachial Palpable Palpable  Gastrointestinal: Non-distended. No guarding/no peritoneal signs.  Musculoskeletal: M/S 5/5 throughout.  No deformity or atrophy.  Neurologic: CN 2-12 intact. Symmetrical.  Speech is fluent. Motor exam as listed above. Psychiatric: Judgment intact, Mood & affect appropriate for pt's clinical situation. Dermatologic: No rashes or ulcers noted.  No changes consistent with cellulitis.   CBC Lab Results  Component Value Date   WBC 6.4 10/06/2019   HGB 8.3 (L) 10/06/2019   HCT 25.8 (L) 10/06/2019   MCV 89.6 10/06/2019   PLT 208 10/06/2019    BMET    Component Value Date/Time   NA 142 10/04/2019 0454   NA 141 04/23/2013 0534   K 4.2 10/04/2019 0454   K 3.9 04/23/2013 0534   CL 105 10/04/2019 0454   CL 112 (H) 04/23/2013 0534   CO2 28 10/04/2019 0454   CO2 23 04/23/2013 0534   GLUCOSE 88 10/04/2019 0454   GLUCOSE 92 04/23/2013 0534   BUN 41 (H) 10/04/2019 0454   BUN 31 (H) 04/23/2013 0534    CREATININE 2.61 (H) 10/04/2019 0454   CREATININE 1.78 (H) 04/23/2013 0534   CALCIUM 9.0 10/04/2019 0454   CALCIUM 8.6 04/23/2013 0534   GFRNONAA 16 (L) 10/04/2019 0454   GFRNONAA 27 (L) 04/23/2013 0534   GFRAA 19 (L) 10/04/2019 0454   GFRAA 32 (L) 04/23/2013 0534   CrCl cannot be calculated (Patient's most recent lab result is older than the maximum 21 days allowed.).  COAG Lab Results  Component Value Date   INR 1.0 02/04/2013    Radiology No results found.   Assessment/Plan 1. CKD (chronic kidney disease), stage V ([place]) Recommend:  At this time the patient does not have appropriate extremity access for dialysis  Patient should have a left brachial axillary graft created. Vein mapping does not  reveal superficial veins that are adequate for fistula creation  The risks, benefits and alternative therapies were reviewed in detail with the patient.  All questions were answered.  The patient agrees to proceed with surgery.    2. Essential hypertension Continue antihypertensive medications as already ordered, these medications have been reviewed and there are no changes at this time.   3. Combined systolic and diastolic congestive heart failure, unspecified HF chronicity (HCC) Continue cardiac and antihypertensive medications as already ordered and reviewed, no changes at this time.  Continue statin as ordered and reviewed, no changes at this time  Nitrates PRN for chest pain   4. Gastroesophageal reflux disease without esophagitis Continue PPI as already ordered, this medication has been reviewed and there are no changes at this time.  Avoidence of caffeine and alcohol  Moderate elevation of the head of the bed   5. Lymphedema No surgery or intervention at this point in time.    I have reviewed my discussion with the patient regarding venous insufficiency and secondary lymph edema and why it  causes symptoms. I have discussed with the patient the chronic skin changes  that accompany these problems and the long term sequela such as ulceration and infection.  Patient will continue wearing graduated compression stockings class 1 (20-30 mmHg) on a daily basis a prescription was given to the patient to keep this updated. The patient will  put the stockings on first thing in the morning and removing them in the evening. The patient is instructed specifically not to sleep in the stockings.  In addition, behavioral modification including elevation during the day will be continued.  Diet and salt restriction was also discussed.  Previous duplex ultrasound of the lower extremities shows normal deep venous system, superficial reflux was not present.   Following the review of the ultrasound the patient will follow up in 12 months to reassess the degree of swelling and the control that graduated compression is offering.   The patient can be assessed for a Lymph Pump at that time.  However, at this time the patient states they are satisfied with the control compression and elevation is yielding.      Hortencia Pilar, MD  12/03/2019 2:01 PM

## 2019-12-04 ENCOUNTER — Telehealth (INDEPENDENT_AMBULATORY_CARE_PROVIDER_SITE_OTHER): Payer: Self-pay

## 2019-12-04 ENCOUNTER — Encounter (INDEPENDENT_AMBULATORY_CARE_PROVIDER_SITE_OTHER): Payer: Self-pay

## 2019-12-04 NOTE — Telephone Encounter (Signed)
Patient's aide called back and the patient is scheduled with Dr. Delana Meyer for a left brachial axillary graft on 12/12/19 at the [facility].

## 2019-12-04 NOTE — Telephone Encounter (Signed)
I attempted to contact the patient's aide to schedule her surgery and a message was left for a return call.

## 2019-12-04 NOTE — Telephone Encounter (Signed)
Patient will do her phone call pre-op on 12/07/19 between 8-1 pm and covid testing on 12/10/19 between 8-1 pm at the [facility].

## 2019-12-06 ENCOUNTER — Other Ambulatory Visit (INDEPENDENT_AMBULATORY_CARE_PROVIDER_SITE_OTHER): Payer: Self-pay | Admitting: Nurse Practitioner

## 2019-12-07 ENCOUNTER — Encounter
Admission: RE | Admit: 2019-12-07 | Discharge: 2019-12-07 | Disposition: A | Discharge status: Home / Self Care | Payer: Medicare Other | Source: Ambulatory Visit | Attending: Vascular Surgery | Admitting: Vascular Surgery

## 2019-12-07 NOTE — Pre-Procedure Instructions (Signed)
NM Myocar Multi W/Spect W/Wall Motion / EF (Accession [id number]) (Order [id number]) Imaging Date: 07/01/2019 Department: [facility] PCU Released By/Authorizing: Kate Sable, MD (auto-released)  Exam Status  Status  Final [99]  PACS Intelerad Image Link  Show images for NM Myocar Multi W/Spect W/Wall Motion / EF Study Result  Narrative & Impression   Defect 1: There is a small defect of moderate severity present in the apex location. This is likely due to breast attenuation artifact  This is an intermediate risk study due to reduced EF  The left ventricular ejection fraction is moderately decreased (30-44%).  No evidence of reversibility to suggest ischemia and no large perfusion defects to suggest previous infarct.  Suspect nonischemic cardiomyopathy.    Scans on Order [id number]  Scan on 07/09/2019 7:10 AM by Default, Provider, MD   Result History  NM Myocar Multi W/Spect W/Wall Motion / EF (Order #[id number]) on 07/02/2019 - Order Result History Report MyChart Results Release  MyChart Status: Active Results Release  Encounter-Level Documents - 06/26/2019:  Scan on 07/03/2019 12:35 PM by Default, Provider, MD Scan on 07/03/2019 12:35 PM by Default, Provider, MD Document on 07/02/2019 4:16 PM by Feliberto Gottron, RN: IP After Visit Summary Document on 07/01/2019 3:34 PM by Royal, Wyonia Hough, NT: ED PB Billing Extract Scan on 06/30/2019 8:36 AM by Default, Provider, MD Scan on 06/29/2019 3:20 PM by Default, Provider, MD Scan on 06/28/2019 10:58 AM by Default, Provider, MD Scan on 07/01/2019 8:44 PM by Default, Provider, MD Scan on 07/02/2019 9:17 AM by Default, Provider, MD Scan on 06/28/2019 9:59 AM by Thornton Dales C: verbal consent by phone Scan on 06/26/2019 10:52 PM by Caryl Bis Documents - 06/26/2019:  Scan on 07/09/2019 7:10 AM by Default, Provider, MD Scan on 07/03/2019 12:44 PM by Default, Provider,  MD Scan on 06/27/2019 1:49 PM by Default, Provider, H[place]:  There are no hospital account-level documents. Vitals  Height Weight BMI (Calculated)  5\' 4"  (1.626 m) 87.3 kg 33.02  Protocol Documents  Imaging Protocol  Nuclear Stress Findings  Nuclear Study Quality Overall image quality is good.  Image quality affected due to significant extracardiac activity.  Rest Perfusion There is a defect present in the apex location.  Stress Perfusion There is a defect present in the apex location.  Perfusion Summary Defect 1:  There is a small defect of moderate severity present in the apex location. The defect is consistent with artifact.  Overall Study Impression Myocardial perfusion is normal.   This is an intermediate risk study.  Overall left ventricular systolic function was abnormal.    LV cavity size is moderately enlarged.  The left ventricular ejection fraction is moderately decreased (30-44%).    From: ACCF/SCAI/STS/AATS/AHA/ASNC/HFSA/SCCT 2012 Appropriate Use Criteria for Coronary Revascularization Focused Update  Stress Measurements  Baseline Vitals  Rest HR 68 bpm    Rest BP 158/82 mmHg    Exercise Time  Exercise duration (min) 0 min    Exercise duration (sec) 0 sec    Peak Stress Vitals  Peak HR 90 bpm    Peak BP 158/82 mmHg    Exercise Data  MPHR 138 bpm    Percent HR 65 %    Estimated workload 1 METS       Stress Findings  ECG Baseline ECG exhibits normal sinus rhythm and with PACs.Wandering atrial pacemaker w/ freq PACs, 68, mild inf st dep. Marland Kitchen  Stress Findings A pharmacological stress test was performed using IV Lexiscan 0.4mg  over 10 seconds performed without concurrent submaximal exercise.   The patient reported chest pain, shortness of breath, headache and flushing during the stress test.  Response to Stress Arrhythmias during stress:  frequent PACs.   Arrhythmias during recovery:  frequent frequent, PACs.      Arrhythmias were not significant.   ECG was interpretable and there was no significant change from baseline.  Wall Scoring  Score Index: 2.000 Percent Normal: 0.0%           The left ventricular wall motion is globally hypokinetic.          Imaging  Imaging Information  NM Myocar Multi W/Spect W/Wall Motion / EF: Patient Communication  Add Comments Add Notifications  Resulted by:  Signed Date/Time  Phone Pager  Kathlyn Sacramento A 07/02/2019 1:45 PM [phone]   Imaging Related Medications  Medication  technetium tetrofosmin (TC-MYOVIEW) injection 9.4 millicurie (Completed)  Route: Intravenous  Admin Amount: 9.4 millicurie  PRN Reason(s): radiopharmaceutical  Last Admin Time: 07/02/19 0942  Number of Expected Doses: 1  Most Recent Administration:  User Action Time Recorded Time Dose Route Site Comment Action Reason  Sibyl Parr, RT 07/02/19 4287 68/11/57 2620 9.4 millicurie Intravenous   Contrast Given   Full Administration Report            Medication  technetium tetrofosmin (TC-MYOVIEW) injection 35.5 millicurie (Completed)  Route: Intravenous  Admin Amount: 97.4 millicurie  PRN Reason(s): radiopharmaceutical  Last Admin Time: 07/02/19 1046  Number of Expected Doses: 1  Most Recent Administration:  User Action Time Recorded Time Dose Route Site Comment Action Reason  Ane Payment, RT 07/02/19 1046 16/38/45 3646 80.3 millicurie Intravenous   Contrast Given   Full Administration Report            Medication  regadenoson (LEXISCAN) injection SOLN 0.4 mg (Completed)  Route: Intravenous  Admin Amount: 5 mL = 0.4 mg of 0.4 mg/5 mL  Volume: 5 mL  Last Admin Time: 07/02/19 1046  Number of Expected Doses: 1  Most Recent Administration:  User Action Time Recorded Time Dose Route Site Comment Action Reason  Ane Payment, RT 07/02/19 1046 07/02/19 1110 0.4 mg Intravenous   Given   Full Administration Report             Original Order  Ordered On Ordered By   07/01/2019 12:07 PM Kate Sable, MD       External Result Report  External Result Report

## 2019-12-07 NOTE — Patient Instructions (Addendum)
Your procedure is scheduled on: 12-12-19 [facility] Report to Same Day Surgery 2nd floor medical mall [facility] Entrance-take elevator on left to 2nd floor.  Check in with surgery information desk.) To find out your arrival time please call [phone] between 1PM - 3PM on 12-11-19 TUESDAY  Remember: Instructions that are not followed completely may result in serious medical risk, up to and including death, or upon the discretion of your surgeon and anesthesiologist your surgery may need to be rescheduled.    _x___ 1. Do not eat food after midnight the night before your procedure. NO GUM OR CANDY AFTER MIDNIGHT. You may drink clear liquids up to 2 hours before you are scheduled to arrive at the hospital for your procedure.  Do not drink clear liquids within 2 hours of your scheduled arrival to the hospital.  Clear liquids include  --Water or Apple juice without pulp  --Gatorade  --Black Coffee or Clear Tea (No milk, no creamers, do not add anything to the coffee or Tea   __x__ 2. No Alcohol for 24 hours before or after surgery.   __x__3. No Smoking or e-cigarettes for 24 prior to surgery.  Do not use any chewable tobacco products for at least 6 hour prior to surgery   ____  4. Bring all medications with you on the day of surgery if instructed.    __x__ 5. Notify your doctor if there is any change in your medical condition     (cold, fever, infections).    x___6. On the morning of surgery brush your teeth with toothpaste and water.  You may rinse your mouth with mouth wash if you wish.  Do not swallow any toothpaste or mouthwash.   Do not wear jewelry, make-up, hairpins, clips or nail polish.  Do not wear lotions, powders, or perfumes.   Do not shave 48 hours prior to surgery. Men may shave face and neck.  Do not bring valuables to the hospital.    [facility] is not responsible for any belongings or valuables.               Contacts, dentures or bridgework may not be worn into  surgery.  Leave your suitcase in the car. After surgery it may be brought to your room.  For patients admitted to the hospital, discharge time is determined by your treatment team.  _  Patients discharged the day of surgery will not be allowed to drive home.  You will need someone to drive you home and stay with you the night of your procedure.    Please read over the following fact sheets that you were given:   [facility] Preparing for Surgery and or MRSA Information   _x___ TAKE THE FOLLOWING MEDICATION THE MORNING OF SURGERY WITH A SMALL SIP OF WATER. These include:  1. NEXIUM (ESOMEPRAZOLE)  2. ZOLOFT (SERTRALINE)  3. BUSPAR (BUSPIRONE)  4. HYDRALAZINE (APRESOLINE)  5. METOPROLOL (TOPROL)  6.PT CAN HAVE ULTRAM DAY OF SURGERY IF NEEDED   ____Fleets enema or Magnesium Citrate as directed.   _x___ Use CHG WIPES as directed on instruction sheet   ____ Use inhalers on the day of surgery and bring to hospital day of surgery  ____ Stop Metformin and Janumet 2 days prior to surgery.    ____ Take 1/2 of usual insulin dose the night before surgery and none on the morning surgery.   _x___ Follow recommendations from Cardiologist, Pulmonologist or PCP regarding stopping Aspirin, Coumadin, Plavix ,Eliquis, Effient, or  Pradaxa, and Pletal-OK TO CONTINUE ASPIRIN-DO NOT TAKE ASPIRIN MORNING OF SURGERY  X____Stop Anti-inflammatories such as Advil, Aleve, Ibuprofen, Motrin, Naproxen, Naprosyn, Goodies powders or aspirin products. OK to take Tylenol OR ULTRAM IF NEEDED   _X___ Stop supplements until after surgery-STOP FISH OIL NOW-MAY RESUME AFTER SURGERY   ____ Bring C-Pap to the hospital.

## 2019-12-07 NOTE — Telephone Encounter (Signed)
Patient's appt's were scheduled with her aide, Trish at [phone].

## 2019-12-07 NOTE — Pre-Procedure Instructions (Signed)
EKG  ED ECG REPORT I, Blake Divine, the attending physician, personally viewed and interpreted this ECG.   Date: 09/28/2019  EKG Time: 15:44  Rate: 65  Rhythm: normal sinus rhythm  Axis: Normal  Intervals:none  ST&T Change: None   PROCEDURES  Procedure(s) performed (including Critical Care):  .Critical Care Performed by: Blake Divine, MD Authorized by: Blake Divine, MD   Critical care provider statement:    Critical care time (minutes):  45   Critical care time was exclusive of:  Separately billable procedures and treating other patients and teaching time   Critical care was necessary to treat or prevent imminent or life-threatening deterioration of the following conditions:  Respiratory failure   Critical care was time spent personally by me on the following activities:  Discussions with consultants, evaluation of patient's response to treatment, examination of patient, ordering and performing treatments and interventions, ordering and review of laboratory studies, ordering and review of radiographic studies, pulse oximetry, re-evaluation of patient's condition, obtaining history from patient or surrogate and review of old charts   I assumed direction of critical care for this patient from another provider in my specialty: no   .1-3 Lead EKG Interpretation Performed by: Blake Divine, MD Authorized by: Blake Divine, MD     Interpretation: normal     ECG rate:  65   ECG rate assessment: normal     Rhythm: sinus rhythm     Ectopy: none     Conduction: normal       ____________________________________________   INITIAL IMPRESSION / ASSESSMENT AND PLAN / ED COURSE      85 year old female presents to the ED with increased shortness of breath over the past 2 to 3 days, noted to have O2 sat of 88% by EMS that improved with 2 L nasal cannula.  She appears fluid overloaded on exam with crackles at bases.  Chest x-ray consistent with pulmonary  edema and we will diurese with IV Lasix.  Given her hypoxia and chronic kidney disease, she would benefit from admission for careful diuresis.  EKG shows no evidence of ischemia or arrhythmia and very mildly elevated but stable on recheck.    ____________________________________________   FINAL CLINICAL IMPRESSION(S) / ED DIAGNOSES  Final diagnoses:  Acute on chronic diastolic congestive heart failure (HCC)  Shortness of breath        ED Discharge Orders               Ordered     Amb Referral to HF Clinic     09/28/19 1731            Note:  This document was prepared using Dragon voice recognition software and may include unintentional dictation errors.   Blake Divine, MD 09/28/19 2035         Electronically signed by Blake Divine, MD at 09/28/2019 8:35 PM  ED to Hosp-Admission (Discharged) on 09/28/2019   ED to Hosp-Admission (Discharged) on 09/28/2019     Detailed Report    Note shared with patient

## 2019-12-10 ENCOUNTER — Other Ambulatory Visit
Admission: RE | Admit: 2019-12-10 | Discharge: 2019-12-10 | Disposition: A | Discharge status: Home / Self Care | Payer: Medicare Other | Source: Ambulatory Visit | Attending: Vascular Surgery | Admitting: Vascular Surgery

## 2019-12-10 ENCOUNTER — Telehealth: Payer: Self-pay | Admitting: Cardiovascular Disease

## 2019-12-10 ENCOUNTER — Other Ambulatory Visit: Payer: Self-pay

## 2019-12-10 DIAGNOSIS — Z7982 Long term (current) use of aspirin: Secondary | ICD-10-CM | POA: Diagnosis not present

## 2019-12-10 DIAGNOSIS — Z79899 Other long term (current) drug therapy: Secondary | ICD-10-CM | POA: Diagnosis not present

## 2019-12-10 DIAGNOSIS — I509 Heart failure, unspecified: Secondary | ICD-10-CM | POA: Insufficient documentation

## 2019-12-10 DIAGNOSIS — Z20822 Contact with and (suspected) exposure to covid-19: Secondary | ICD-10-CM | POA: Diagnosis not present

## 2019-12-10 DIAGNOSIS — K219 Gastro-esophageal reflux disease without esophagitis: Secondary | ICD-10-CM | POA: Insufficient documentation

## 2019-12-10 DIAGNOSIS — I132 Hypertensive heart and chronic kidney disease with heart failure and with stage 5 chronic kidney disease, or end stage renal disease: Secondary | ICD-10-CM | POA: Insufficient documentation

## 2019-12-10 DIAGNOSIS — N186 End stage renal disease: Secondary | ICD-10-CM | POA: Diagnosis not present

## 2019-12-10 DIAGNOSIS — Z01812 Encounter for preprocedural laboratory examination: Secondary | ICD-10-CM | POA: Diagnosis present

## 2019-12-10 DIAGNOSIS — E785 Hyperlipidemia, unspecified: Secondary | ICD-10-CM | POA: Insufficient documentation

## 2019-12-10 DIAGNOSIS — Z992 Dependence on renal dialysis: Secondary | ICD-10-CM | POA: Insufficient documentation

## 2019-12-10 LAB — CBC WITH DIFFERENTIAL/PLATELET
Abs Immature Granulocytes: 0.03 10*3/uL (ref 0.00–0.07)
Basophils Absolute: 0.1 10*3/uL (ref 0.0–0.1)
Basophils Relative: 1 %
Eosinophils Absolute: 0.2 10*3/uL (ref 0.0–0.5)
Eosinophils Relative: 2 %
HCT: 34.9 % — ABNORMAL LOW (ref 36.0–46.0)
Hemoglobin: 10.8 g/dL — ABNORMAL LOW (ref 12.0–15.0)
Immature Granulocytes: 0 %
Lymphocytes Relative: 16 %
Lymphs Abs: 1.2 10*3/uL (ref 0.7–4.0)
MCH: 26.8 pg (ref 26.0–34.0)
MCHC: 30.9 g/dL (ref 30.0–36.0)
MCV: 86.6 fL (ref 80.0–100.0)
Monocytes Absolute: 0.6 10*3/uL (ref 0.1–1.0)
Monocytes Relative: 8 %
Neutro Abs: 5.5 10*3/uL (ref 1.7–7.7)
Neutrophils Relative %: 73 %
Platelets: 236 10*3/uL (ref 150–400)
RBC: 4.03 MIL/uL (ref 3.87–5.11)
RDW: 18.4 % — ABNORMAL HIGH (ref 11.5–15.5)
WBC: 7.5 10*3/uL (ref 4.0–10.5)
nRBC: 0 % (ref 0.0–0.2)

## 2019-12-10 LAB — BASIC METABOLIC PANEL
Anion gap: 16 — ABNORMAL HIGH (ref 5–15)
BUN: 38 mg/dL — ABNORMAL HIGH (ref 8–23)
CO2: 27 mmol/L (ref 22–32)
Calcium: 9 mg/dL (ref 8.9–10.3)
Chloride: 98 mmol/L (ref 98–111)
Creatinine, Ser: 4.3 mg/dL — ABNORMAL HIGH (ref 0.44–1.00)
GFR calc Af Amer: 10 mL/min — ABNORMAL LOW (ref >60)
GFR calc non Af Amer: 9 mL/min — ABNORMAL LOW (ref >60)
Glucose, Bld: 82 mg/dL (ref 70–99)
Potassium: 4.6 mmol/L (ref 3.5–5.1)
Sodium: 141 mmol/L (ref 135–145)

## 2019-12-10 LAB — APTT: aPTT: 30 seconds (ref 24–36)

## 2019-12-10 LAB — SARS CORONAVIRUS 2 (TAT 6-24 HRS): SARS Coronavirus 2: NEGATIVE

## 2019-12-10 LAB — TYPE AND SCREEN
ABO/RH(D): O POS
Antibody Screen: NEGATIVE

## 2019-12-10 LAB — PROTIME-INR
INR: 1 (ref 0.8–1.2)
Prothrombin Time: 12.5 seconds (ref 11.4–15.2)

## 2019-12-10 NOTE — Telephone Encounter (Signed)
Patient returned call

## 2019-12-10 NOTE — Telephone Encounter (Signed)
   Primary Cardiologist:No primary care provider on file. Unclear - ? [facility].  Chart reviewed as part of pre-operative protocol coverage. Because of Misty Berry's past medical history and time since last visit, they will require a follow-up visit in order to better assess preoperative cardiovascular risk.  She has not been seen in our clinic before, but was seen in consult in 06/2019 at which time it was recommended she follow up with her primary cardiologist at [facility]. She has seen the [facility] in the interim but this does not count towards having been seen by [facility] in outpatient setting as the NP is not affiliated with our group.  Pre-op covering staff: - Please find out who patient identifies as her primary cardiologist - if [facility], please inform surgeon's office to direct pre-op clearance request to them. If she'd like to establish ongoing care with [place], please schedule appointment and call patient to inform them. She saw Dr. Garen Lah in consultation in January first.  - Please contact requesting surgeon's office via preferred method (i.e, phone, fax) to inform them of need for appointment prior to surgery.   Charlie Pitter, PA-C  12/10/2019, 11:29 AM

## 2019-12-10 NOTE — Telephone Encounter (Signed)
Left message that we were calling to confirm if [facility] is her primary card.

## 2019-12-10 NOTE — Telephone Encounter (Signed)
Pt resides in [place]. Pt is in the memory unit. I s/w nurse who states she will confirm with POA for the pt if [facility] is primary card. I was then asked if [facility] is no longer primary card would we clear her for the surgery. I explained that we would not be able too and that the pt would need to establish care with [facility] which would mean that her surgery would have to be postponed. Nurse will call back after she verifies w/POA for the pt. I instructed her to call back and ask for the pre op team.

## 2019-12-10 NOTE — Telephone Encounter (Signed)
   [facility] Pre-operative Risk Assessment    HEARTCARE STAFF: - Please ensure there is not already an duplicate clearance open for this procedure. - Under Visit Info/Reason for Call, type in Other and utilize the format Clearance MM/DD/YY or Clearance TBD. Do not use dashes or single digits. - If request is for dental extraction, please clarify the # of teeth to be extracted.  Request for surgical clearance:  1. What type of surgery is being performed? Brachial Axillary Graft Placement   2. When is this surgery scheduled? 12/12/19  3. What type of clearance is required (medical clearance vs. Pharmacy clearance to hold med vs. Both)? Both   4. Are there any medications that need to be held prior to surgery and how long? Not noted   5. Practice name and name of physician performing surgery?  [facility] pre admit Dr. Delana Meyer   6. What is the office phone number?  [phone]   7.   What is the office fax number? [phone]  8.   Anesthesia type (None, local, MAC, general) ? Not noted    Clarisse Gouge 12/10/2019, 11:04 AM  _________________________________________________________________   (provider comments below)

## 2019-12-10 NOTE — Progress Notes (Addendum)
[facility] Perioperative Services  Pre-Admission/Anesthesia Testing Clinical Review  Date: 12/11/19  Patient Demographics:  Name: Misty Berry DOB:   January 16, 1935 MRN:   [id number]  Planned Surgical Procedure(s):    Case: [id number] Date/Time: 12/12/19 1158   Procedure: INSERTION OF ARTERIOVENOUS (AV) GORE-TEX GRAFT ARM ( BRACHIAL AXILLARY ) (Left )   Anesthesia type: General   Pre-op diagnosis: ESRD   Location: [facility] OR ROOM 07 / [place] ORS FOR ANESTHESIA GROUP   Surgeons: Katha Cabal, MD     NOTE: Available PAT nursing documentation and vital signs have been reviewed. Clinical nursing staff has updated patient's PMH/PSHx, current medication list, and drug allergies/intolerances to ensure comprehensive history available to assist in medical decision making as it pertains to the aforementioned surgical procedure and anticipated anesthetic course.   Clinical Discussion:  Misty Berry is a 85 y.o. female who is submitted for pre-surgical anesthesia review and clearance prior to her undergoing the above procedure. Patient has never been a smoker. Pertinent PMH includes: CHD, HLD, HTN, hypertensive heart disease, CKD (baseline Cr 2.9-4.3), GERD, respiratory failure, anemia of chronic disease, lymphedema, venous insufficiency, depression, mild dementia, enucleation of the LEFT eye.   Patient is followed by cardiology. After some investigation, it seems as if her cardiologist is at [facility]. She was last seen on 10/03/2018; notes reviewed. Patient was doing well overall at that time. She was on a dietary sodium restriction.  Recent admissions:  Patient admitted to [facility] in 06/2019 for CHF and pneumonia. EMS noted oxygen saturations in the 80s PTA. SARS-CoV-2 (novel coronavirus) was negative. Patient required IV diuretic therapy and high flow supplemental oxygen during her admission. Hgb 7.7 g/dL for which she received 1 unit PRBCs. Dr. Garen Lah (cardiology) was consulted  due to reduced LVEF (35-40%; see results below). Myoview was ordered that suggested non-ischemic cardiomyopathy (see results below). Due to CKD diagnosis, patient deemed to be poor candidate for heart catheterization. Discussed outpatient hematology consult to discuss IV iron infusions vs. Epogen injections. Patient ultimately discharged home on 07/02/2019 with instructions to follow up with nephrology (1-2 weeks) and cardiology (2 weeks).    Patient admitted to [facility] again on 09/28/2019 for respiratory failure related to her known CHF. Oxygen saturations 88% on RA upon arrival; increased to 98% on 4L/[place]. BNP was 2308, creatinine 3.09, hemoglobin 8.5, hematocrit 27.7. Her arrival blood pressure was 200/91. She was seen by nephrology during her admission and HD was recommended. Patient resistant initially, however due to worsening renal function, patient ultimately agreed to accept recommendations for HD. RIJ permatech catheter placed on 10/02/2019 by Dr. Delana Meyer (vascular surgery). Palliative medicine was consulted and will follow patient as outpatient; DNR signed. HD treatments initiated during admission. Patient discharged to facility on 10/06/2019. Patient followed by [facility] heart failure clinic.   She denies previous intra-operative complications with anesthesia. This patient is on daily ASA therapy.  She has been instructed on recommendations for continuing her ASA and holding on the morning of her procedure only.   Vitals with BMI 12/03/2019 11/02/2019 10/24/2019  Height 5\' 2"  - -  Weight 180 lbs 206 lbs 202 lbs  BMI 70.48 - 88.91  Systolic 694 503 888  Diastolic 61 72 76  Pulse 76 84 86    Providers/Specialists:   PROVIDER ROLE LAST Anne Hahn, Dolores Lory, MD Vascular surgery 12/03/2019  [facility], [facility] Primary Care Provider ?  Trinda Pascal, MD Nephrology - [facility] 09/06/2019  Ofilia Neas, MD Cardiology - [facility] 10/03/2018  Allergies:  Ivp dye [iodinated diagnostic agents], Lactose,  Methylprednisolone, Peanuts [peanut oil], Vancomycin, and Wellbutrin [bupropion]  Current Home Medications:   No current facility-administered medications for this encounter.   Marland Kitchen acidophilus (RISAQUAD) CAPS capsule  . aspirin 81 MG chewable tablet  . bumetanide (BUMEX) 1 MG tablet  . busPIRone (BUSPAR) 5 MG tablet  . esomeprazole (NEXIUM) 40 MG capsule  . hydrALAZINE (APRESOLINE) 25 MG tablet  . losartan (COZAAR) 50 MG tablet  . metoprolol succinate (TOPROL-XL) 25 MG 24 hr tablet  . Skin Protectants, Misc. (BAZA PROTECT EX)  . traMADol (ULTRAM) 50 MG tablet  . calcium-vitamin D (OSCAL WITH D) 500-200 MG-UNIT tablet  . docusate sodium (COLACE) 100 MG capsule  . Ergocalciferol (VITAMIN D2) 50 MCG (2000 UT) TABS  . ferrous sulfate 325 (65 FE) MG tablet  . Multiple Vitamins-Minerals (CENTRUM SILVER PO)  . Omega-3 Fatty Acids (FISH OIL) 1000 MG CAPS  . sertraline (ZOLOFT) 100 MG tablet   History:   Past Medical History:  Diagnosis Date  . Asthma   . Bruises easily   . Cellulitis   . CHF (congestive heart failure) ([place])   . Cough   . DDD (degenerative disc disease), lumbar   . GERD (gastroesophageal reflux disease)   . HBP (high blood pressure)   . Hypercholesterolemia   . Hyperparathyroidism ([place])   . Osteoarthritis   . Osteoporosis   . Rash   . Renal disorder    kidney disease  . Swelling    Past Surgical History:  Procedure Laterality Date  . APPENDECTOMY    . BREAST REDUCTION SURGERY    . CHOLECYSTECTOMY    . DIALYSIS/PERMA CATHETER INSERTION N/A 10/02/2019   Procedure: DIALYSIS/PERMA CATHETER INSERTION;  Surgeon: Katha Cabal, MD;  Location: [facility];  Service: Cardiovascular;  Laterality: N/A;  . EYE SURGERY Left   . FEMUR SURGERY    . FOOT SURGERY Right   . GALLBLADDER SURGERY    . LIPECTOMY    . PAROTID GLAND TUMOR EXCISION    . THYROID SURGERY    . TONSILLECTOMY    . TUBAL LIGATION    . VITRECTOMY     No family history on  file. Social History   Tobacco Use  . Smoking status: Never Smoker  . Smokeless tobacco: Never Used  Vaping Use  . Vaping Use: Never used  Substance Use Topics  . Alcohol use: No  . Drug use: No    Pertinent Clinical Results:  No orders of the defined types were placed in this encounter.  LABS: Labs reviewed: Acceptable for surgery. Hospital Outpatient Visit on 12/10/2019  Component Date Value Ref Range Status  . SARS Coronavirus 2 12/10/2019 NEGATIVE  NEGATIVE Final   Comment: (NOTE) SARS-CoV-2 target nucleic acids are NOT DETECTED.  The SARS-CoV-2 RNA is generally detectable in upper and lower respiratory specimens during the acute phase of infection. Negative results do not preclude SARS-CoV-2 infection, do not rule out co-infections with other pathogens, and should not be used as the sole basis for treatment or other patient management decisions. Negative results must be combined with clinical observations, patient history, and epidemiological information. The expected result is Negative.  Fact Sheet for Patients: SugarRoll.be  Fact Sheet for Healthcare Providers: https://www.woods-mathews.com/  This test is not yet approved or cleared by the [place] FDA and  has been authorized for detection and/or diagnosis of SARS-CoV-2 by FDA under an Emergency Use Authorization (EUA). This EUA will remain  in  effect (meaning this test can be used) for the duration of the COVID-19 declaration under Se                          ction 564(b)(1) of the Act, 21 U.S.C. section 360bbb-3(b)(1), unless the authorization is terminated or revoked sooner.  Performed at [facility], [place] [address] [name], [place] [address]   . aPTT 12/10/2019 30  24 - 36 seconds Final   Performed at [facility], [address] [place], [place] [address]  . Sodium 12/10/2019 141  135 - 145 mmol/L Final  . Potassium 12/10/2019 4.6  3.5 -  5.1 mmol/L Final  . Chloride 12/10/2019 98  98 - 111 mmol/L Final  . CO2 12/10/2019 27  22 - 32 mmol/L Final  . Glucose, Bld 12/10/2019 82  70 - 99 mg/dL Final   Glucose reference range applies only to samples taken after fasting for at least 8 hours.  . BUN 12/10/2019 38* 8 - 23 mg/dL Final  . Creatinine, Ser 12/10/2019 4.30* 0.44 - 1.00 mg/dL Final  . Calcium 12/10/2019 9.0  8.9 - 10.3 mg/dL Final  . GFR calc non Af Amer 12/10/2019 9* >60 mL/min Final  . GFR calc Af Amer 12/10/2019 10* >60 mL/min Final  . Anion gap 12/10/2019 16* 5 - 15 Final   Performed at [facility], [address] [place], [place] [address]  . WBC 12/10/2019 7.5  4.0 - 10.5 K/uL Final  . RBC 12/10/2019 4.03  3.87 - 5.11 MIL/uL Final  . Hemoglobin 12/10/2019 10.8* 12.0 - 15.0 g/dL Final  . HCT 12/10/2019 34.9* 36 - 46 % Final  . MCV 12/10/2019 86.6  80.0 - 100.0 fL Final  . MCH 12/10/2019 26.8  26.0 - 34.0 pg Final  . MCHC 12/10/2019 30.9  30.0 - 36.0 g/dL Final  . RDW 12/10/2019 18.4* 11.5 - 15.5 % Final  . Platelets 12/10/2019 236  150 - 400 K/uL Final  . nRBC 12/10/2019 0.0  0.0 - 0.2 % Final  . Neutrophils Relative % 12/10/2019 73  % Final  . Neutro Abs 12/10/2019 5.5  1.7 - 7.7 K/uL Final  . Lymphocytes Relative 12/10/2019 16  % Final  . Lymphs Abs 12/10/2019 1.2  0.7 - 4.0 K/uL Final  . Monocytes Relative 12/10/2019 8  % Final  . Monocytes Absolute 12/10/2019 0.6  0 - 1 K/uL Final  . Eosinophils Relative 12/10/2019 2  % Final  . Eosinophils Absolute 12/10/2019 0.2  0 - 0 K/uL Final  . Basophils Relative 12/10/2019 1  % Final  . Basophils Absolute 12/10/2019 0.1  0 - 0 K/uL Final  . Immature Granulocytes 12/10/2019 0  % Final  . Abs Immature Granulocytes 12/10/2019 0.03  0.00 - 0.07 K/uL Final   Performed at [facility], [address], [place], [place] [address]  . Prothrombin Time 12/10/2019 12.5  11.4 - 15.2 seconds Final  . INR 12/10/2019 1.0  0.8 - 1.2 Final   Comment:  (NOTE) INR goal varies based on device and disease states. Performed at [facility], [address] [place],  [address]   . ABO/RH(D) 12/10/2019 O POS   Final  . Antibody Screen 12/10/2019 NEG   Final  . Sample Expiration 12/10/2019 12/24/2019,2359   Final  . Extend sample reason 12/10/2019    Final  Value:NO TRANSFUSIONS OR PREGNANCY IN THE PAST 3 MONTHS Performed at [facility], [address] [place], [place] [address]      EKG: Date: 09/28/2019 Time ECG obtained: 1544 PM Rate: 65 bpm Rhythm: SR Axis (leads I and aVF): normal Intervals: QTc 467 ms. ST segment and T wave changes: No evidence of ST segment elevation or depression  IMAGING / PROCEDURES: CHEST RADIOGRAPHS done on 09/28/2019 Cardiac shadow is enlarged but stable. Aortic calcifications are again seen. Small bilateral pleural effusions are noted. Mild bibasilar atelectasis is seen stable from the prior exam. Old rib fractures are seen bilaterally.  LEXISCAN STRESS TEST done on 07/02/2019  Defect 1: There is a small defect of moderate severity present in the apex location. This is likely due to breast attenuation artifact  This is an intermediate risk study due to reduced EF  The left ventricular ejection fraction is moderately decreased (30-44%).  No evidence of reversibility to suggest ischemia and no large perfusion defects to suggest previous infarct.  Suspect nonischemic cardiomyopathy.  ECHOCARDIOGRAM done on 06/27/2019 1. Left ventricular ejection fraction, by visual estimation, is 35 to 40%. The left ventricle has moderately decreased function. There is no left ventricular hypertrophy.  2. Left ventricular diastolic function could not be evaluated.  3. The left ventricle demonstrates regional wall motion abnormalities.  4. LV inferior wall hypokinesis.  5. Global right ventricle was not well visualized.The right ventricular size is not assessed. Right  ventricular wall thickness was not assessed.  6. Left atrial size was normal.  7. Right atrial size was not well visualized.  8. The mitral valve is normal in structure. No evidence of mitral valve regurgitation. 9. The tricuspid valve is grossly normal.  10. The aortic valve is grossly normal. Aortic valve regurgitation is not visualized.  11. Pulmonic regurgitation not assessed.  12. The pulmonic valve was not well visualized. Pulmonic valve regurgitation not assessed. 13. The interatrial septum was not well visualized.   Impression and Plan:  Peighton Mehra Barnett has been referred for pre-anesthesia review and clearance prior her undergoing the planned anesthetic and procedural courses. Available labs, pertinent testing, and imaging results were personally reviewed by me. Patient has an extensive medical history. I have reached out to her primary cardiologist Madlyn Frankel, MD) at [facility] and learned that the patient has not been seen in over 2 years. Patient has been followed with the [facility] heart failure clinic; last visit was in April with a planned 2 month follow up. Given her history, multiple hospital admissions, and the fact that patient has not been seen by cardiology since her last admission, I feel as if the patient need to be seen in person prior to her undergoing the planned procedure. I have discussed the case with Dr. Amie Critchley who agrees. Patient was last seen by Dr. Garen Lah in 06/2019. I have reached out to their pre-operative clearance team (Dayna Dunn, PA-C) to determine the feasibility of having this patient seen in our office locally. Awaiting response. In the interim, I am notifying the surgeon's office and patient that the procedure is being cancelled being cardiac clearance. I have also let the [place] charge nurse know as well.   Honor Loh, MSN, APRN, FNP-C, CEN [facility]  Peri-operative Services Nurse Practitioner Phone: [phone] 12/11/19 1:41 PM  NOTE: This  note has been prepared using Dragon dictation software. Despite my best ability to proofread, there is always the potential that unintentional transcriptional errors may still occur from this  process.

## 2019-12-11 ENCOUNTER — Telehealth (INDEPENDENT_AMBULATORY_CARE_PROVIDER_SITE_OTHER): Payer: Self-pay

## 2019-12-11 NOTE — Telephone Encounter (Signed)
Patient has been canceled due to needing cardiac clearance. Patient does have an appt on 12/12/19 at 9:30 am with [facility]. We will know after the appt if she is cleared.

## 2019-12-11 NOTE — Progress Notes (Addendum)
  [facility] Perioperative Services   Date: 12/11/19  Patient Demographics:  Name: Misty Berry DOB:   [birth year] MRN:   [id number]  Re: Cardiology clearance and plans for procedure.  Patient being worked in with cardiology tomorrow morning (12/12/19) at 0930; plans are for her to be seen by Gilford Rile, NP. Unsure if patient will be cleared from a cardiac standpoint for procedure following this visit. Procedure initially cancelled pending cardiology consult and clearance. Received a call from [employer], [place] (OR) advising that patient was being left on the OR schedule at this point. RN advises that Dr. Delana Meyer wants patient left on schedule rather than her being rescheduled. I advised that we may not have cardiac clearance soon enough to proceed at the planned time. RN states, "we are leaving her on the schedule. She has to be dialyzed".   Pre-operative lab testing has been completed and is acceptable for surgery. Barring in acute changes in her condition, it should be acceptable for patient to proceed with scheduled procedure tomorrow. Again, given her PMH, anesthesia is requesting cardiac clearance prior to proceeding.   ADDENDUM: 12/11/2019 @ 1855 PM - message received from [facility] cardiology provider Luci Bank, PA-C to discuss patient. I called provider back and she confirmed that patient had not been seen by them since October 2019. Provider noted that they were willing to provide a 1615 PM appointment tomorrow at [facility] until it was discovered that patient was in a memory care setting, thus making transportation an issue. I updated provider on POC as it stands now; patient seeing cardiology at 0930 AM tomorrow (12/12/19) with the possibility of proceeding with the graft placement as originally planned if she receives the appropriate clearance.   Honor Loh, MSN, APRN, FNP-C, CEN [facility]  Peri-operative Services Nurse Practitioner Phone: [phone] 12/11/19 3:46 PM

## 2019-12-11 NOTE — Telephone Encounter (Signed)
[employer], spoke with [place].  Pt has been scheduled to see Loni Beckwith, NP, tomorrow, 12/12/2019 at 9:30 with the understanding to have pt there 9:15 for registration.  I will route back to the requesting surgeon's office to make them aware of pt's appointment.

## 2019-12-11 NOTE — Telephone Encounter (Signed)
Call placed to pt to find out if [facility] was her primary cards. No answer / no voicemail

## 2019-12-11 NOTE — Telephone Encounter (Signed)
   Primary Cardiologist: No primary care provider on file.  Chart revisited as part of pre-op evaluation. Pre-admissions NP Honor Loh reached out to me today. He had reached out to [facility] who indicated patient had not been seen by them in 2 years. She was last seen by our group in consultation in 06/2019 for acute heart failure with decreased LVEF in context of CKD. She was instructed to f/u with primary cardiologist at [place] [facility] at that time but had not done so. She saw [facility] APP Darylene Price who recommended she f/u in 2 months but patient did not show for that appointment. Per Gaspar Bidding, anesthesia does want cardiac clearance and procedure is listed for under general anesthesia. Furthermore patient is said to reside in memory unit so not really appropriate for phone-call clearance. Recommend in-person appointment - please see if we can expedite this with our [place] team. Other option would be to see if she can get in early follow-up to see Darylene Price, FNP, since she was following the patient afterwards, and see if she would be able to evaluate the patient for clearance requested.  Charlie Pitter, PA-C 12/11/2019, 1:40 PM

## 2019-12-12 ENCOUNTER — Other Ambulatory Visit: Payer: Self-pay

## 2019-12-12 ENCOUNTER — Encounter: Payer: Self-pay | Admitting: Family

## 2019-12-12 ENCOUNTER — Ambulatory Visit (INDEPENDENT_AMBULATORY_CARE_PROVIDER_SITE_OTHER): Payer: Medicare Other | Admitting: Family

## 2019-12-12 ENCOUNTER — Ambulatory Visit
Admission: RE | Admit: 2019-12-12 | Discharge: 2019-12-12 | Disposition: A | Discharge status: Home / Self Care | Payer: Medicare Other | Attending: Vascular Surgery | Admitting: Vascular Surgery

## 2019-12-12 ENCOUNTER — Ambulatory Visit: Payer: Medicare Other | Admitting: Urgent Care

## 2019-12-12 ENCOUNTER — Encounter: Payer: Self-pay | Admitting: Vascular Surgery

## 2019-12-12 ENCOUNTER — Encounter
Admission: RE | Disposition: A | Discharge status: Home / Self Care | Payer: Self-pay | Source: Home / Self Care | Attending: Vascular Surgery

## 2019-12-12 ENCOUNTER — Ambulatory Visit: Payer: Medicare Other

## 2019-12-12 VITALS — BP 124/68 | HR 65 | Ht 62.0 in | Wt 194.1 lb

## 2019-12-12 DIAGNOSIS — E7800 Pure hypercholesterolemia, unspecified: Secondary | ICD-10-CM | POA: Insufficient documentation

## 2019-12-12 DIAGNOSIS — E213 Hyperparathyroidism, unspecified: Secondary | ICD-10-CM | POA: Insufficient documentation

## 2019-12-12 DIAGNOSIS — I5022 Chronic systolic (congestive) heart failure: Secondary | ICD-10-CM

## 2019-12-12 DIAGNOSIS — F329 Major depressive disorder, single episode, unspecified: Secondary | ICD-10-CM | POA: Diagnosis not present

## 2019-12-12 DIAGNOSIS — I428 Other cardiomyopathies: Secondary | ICD-10-CM | POA: Diagnosis not present

## 2019-12-12 DIAGNOSIS — J45909 Unspecified asthma, uncomplicated: Secondary | ICD-10-CM | POA: Diagnosis not present

## 2019-12-12 DIAGNOSIS — N186 End stage renal disease: Secondary | ICD-10-CM

## 2019-12-12 DIAGNOSIS — I739 Peripheral vascular disease, unspecified: Secondary | ICD-10-CM | POA: Insufficient documentation

## 2019-12-12 DIAGNOSIS — I890 Lymphedema, not elsewhere classified: Secondary | ICD-10-CM | POA: Insufficient documentation

## 2019-12-12 DIAGNOSIS — Z7982 Long term (current) use of aspirin: Secondary | ICD-10-CM | POA: Diagnosis not present

## 2019-12-12 DIAGNOSIS — D631 Anemia in chronic kidney disease: Secondary | ICD-10-CM | POA: Diagnosis not present

## 2019-12-12 DIAGNOSIS — E785 Hyperlipidemia, unspecified: Secondary | ICD-10-CM | POA: Insufficient documentation

## 2019-12-12 DIAGNOSIS — Z419 Encounter for procedure for purposes other than remedying health state, unspecified: Secondary | ICD-10-CM

## 2019-12-12 DIAGNOSIS — Z992 Dependence on renal dialysis: Secondary | ICD-10-CM

## 2019-12-12 DIAGNOSIS — F0390 Unspecified dementia without behavioral disturbance: Secondary | ICD-10-CM | POA: Diagnosis not present

## 2019-12-12 DIAGNOSIS — Z79899 Other long term (current) drug therapy: Secondary | ICD-10-CM | POA: Diagnosis not present

## 2019-12-12 DIAGNOSIS — Z881 Allergy status to other antibiotic agents status: Secondary | ICD-10-CM | POA: Insufficient documentation

## 2019-12-12 DIAGNOSIS — I132 Hypertensive heart and chronic kidney disease with heart failure and with stage 5 chronic kidney disease, or end stage renal disease: Secondary | ICD-10-CM | POA: Insufficient documentation

## 2019-12-12 DIAGNOSIS — N185 Chronic kidney disease, stage 5: Secondary | ICD-10-CM | POA: Diagnosis not present

## 2019-12-12 DIAGNOSIS — K219 Gastro-esophageal reflux disease without esophagitis: Secondary | ICD-10-CM | POA: Diagnosis not present

## 2019-12-12 DIAGNOSIS — I509 Heart failure, unspecified: Secondary | ICD-10-CM | POA: Diagnosis not present

## 2019-12-12 DIAGNOSIS — Z01810 Encounter for preprocedural cardiovascular examination: Secondary | ICD-10-CM

## 2019-12-12 HISTORY — PX: AV FISTULA PLACEMENT: SHX1204

## 2019-12-12 LAB — POCT I-STAT, CHEM 8
BUN: 24 mg/dL — ABNORMAL HIGH (ref 8–23)
Calcium, Ion: 1.14 mmol/L — ABNORMAL LOW (ref 1.15–1.40)
Chloride: 101 mmol/L (ref 98–111)
Creatinine, Ser: 3.4 mg/dL — ABNORMAL HIGH (ref 0.44–1.00)
Glucose, Bld: 75 mg/dL (ref 70–99)
HCT: 40 % (ref 36.0–46.0)
Hemoglobin: 13.6 g/dL (ref 12.0–15.0)
Potassium: 4.2 mmol/L (ref 3.5–5.1)
Sodium: 142 mmol/L (ref 135–145)
TCO2: 26 mmol/L (ref 22–32)

## 2019-12-12 SURGERY — INSERTION OF ARTERIOVENOUS (AV) GORE-TEX GRAFT ARM
Anesthesia: General | Laterality: Left

## 2019-12-12 MED ORDER — SODIUM CHLORIDE 0.9 % IV SOLN: INTRAVENOUS | Status: DC

## 2019-12-12 MED ORDER — LIDOCAINE HCL (PF) 2 % IJ SOLN
INTRAMUSCULAR | Status: AC
  Filled 2019-12-12: qty 5

## 2019-12-12 MED ORDER — ONDANSETRON HCL 4 MG/2ML IJ SOLN
INTRAMUSCULAR | Status: DC | PRN
  Administered 2019-12-12: 4 mg via INTRAVENOUS

## 2019-12-12 MED ORDER — DEXAMETHASONE SODIUM PHOSPHATE 10 MG/ML IJ SOLN
INTRAMUSCULAR | Status: DC | PRN
  Administered 2019-12-12: 10 mg via INTRAVENOUS

## 2019-12-12 MED ORDER — PROPOFOL 500 MG/50ML IV EMUL
INTRAVENOUS | Status: DC | PRN
Start: 2019-12-12 — End: 2019-12-12
  Administered 2019-12-12: 100 mg via INTRAVENOUS
  Administered 2019-12-12: 90 ug/kg/min via INTRAVENOUS

## 2019-12-12 MED ORDER — DEXAMETHASONE SODIUM PHOSPHATE 10 MG/ML IJ SOLN
INTRAMUSCULAR | Status: AC
  Filled 2019-12-12: qty 1

## 2019-12-12 MED ORDER — ONDANSETRON HCL 4 MG/2ML IJ SOLN: 4.0000 mg | Freq: Once | INTRAMUSCULAR | Status: DC | PRN

## 2019-12-12 MED ORDER — CEFAZOLIN SODIUM-DEXTROSE 1-4 GM/50ML-% IV SOLN
INTRAVENOUS | Status: AC
  Filled 2019-12-12: qty 50

## 2019-12-12 MED ORDER — BUPIVACAINE HCL (PF) 0.5 % IJ SOLN
INTRAMUSCULAR | Status: AC
  Filled 2019-12-12: qty 30

## 2019-12-12 MED ORDER — EPHEDRINE SULFATE 50 MG/ML IJ SOLN
INTRAMUSCULAR | Status: DC | PRN
  Administered 2019-12-12 (×2): 5 mg via INTRAVENOUS
  Administered 2019-12-12: 25 mg via INTRAVENOUS

## 2019-12-12 MED ORDER — ONDANSETRON HCL 4 MG/2ML IJ SOLN
INTRAMUSCULAR | Status: AC
  Filled 2019-12-12: qty 2

## 2019-12-12 MED ORDER — FENTANYL CITRATE (PF) 100 MCG/2ML IJ SOLN
INTRAMUSCULAR | Status: DC | PRN
  Administered 2019-12-12: 50 ug via INTRAVENOUS
  Administered 2019-12-12 (×4): 25 ug via INTRAVENOUS

## 2019-12-12 MED ORDER — PROPOFOL 500 MG/50ML IV EMUL
INTRAVENOUS | Status: AC
  Filled 2019-12-12: qty 50

## 2019-12-12 MED ORDER — LIDOCAINE HCL (PF) 1 % IJ SOLN
INTRAMUSCULAR | Status: DC | PRN
Start: 2019-12-12 — End: 2019-12-12
  Administered 2019-12-12: 5 mL via SUBCUTANEOUS

## 2019-12-12 MED ORDER — MIDAZOLAM HCL 2 MG/2ML IJ SOLN: 1.0000 mg | Freq: Once | INTRAMUSCULAR | Status: DC

## 2019-12-12 MED ORDER — FIBRIN SEALANT 2 ML SINGLE DOSE KIT
PACK | CUTANEOUS | Status: DC | PRN
  Administered 2019-12-12: 2 mL via TOPICAL

## 2019-12-12 MED ORDER — FENTANYL CITRATE (PF) 100 MCG/2ML IJ SOLN
INTRAMUSCULAR | Status: AC
  Administered 2019-12-12: 50 ug
  Filled 2019-12-12: qty 2

## 2019-12-12 MED ORDER — BUPIVACAINE LIPOSOME 1.3 % IJ SUSP
INTRAMUSCULAR | Status: AC
  Filled 2019-12-12: qty 20

## 2019-12-12 MED ORDER — DEXMEDETOMIDINE HCL 200 MCG/2ML IV SOLN
INTRAVENOUS | Status: DC | PRN
  Administered 2019-12-12 (×2): 8 ug via INTRAVENOUS
  Administered 2019-12-12: 4 ug via INTRAVENOUS

## 2019-12-12 MED ORDER — FENTANYL CITRATE (PF) 100 MCG/2ML IJ SOLN
INTRAMUSCULAR | Status: AC
  Filled 2019-12-12: qty 2

## 2019-12-12 MED ORDER — HYDROCODONE-ACETAMINOPHEN 5-325 MG PO TABS: 1.0000 | ORAL_TABLET | Freq: Four times a day (QID) | ORAL | 0 refills | Status: DC | PRN

## 2019-12-12 MED ORDER — ROPIVACAINE HCL 5 MG/ML IJ SOLN
INTRAMUSCULAR | Status: DC | PRN
Start: 2019-12-12 — End: 2019-12-12
  Administered 2019-12-12: 25 mL via PERINEURAL

## 2019-12-12 MED ORDER — HEMOSTATIC AGENTS (NO CHARGE) OPTIME
TOPICAL | Status: DC | PRN
  Administered 2019-12-12: 1 via TOPICAL

## 2019-12-12 MED ORDER — FENTANYL CITRATE (PF) 100 MCG/2ML IJ SOLN: 25.0000 ug | INTRAMUSCULAR | Status: DC | PRN

## 2019-12-12 MED ORDER — ACETAMINOPHEN 10 MG/ML IV SOLN
INTRAVENOUS | Status: AC
  Filled 2019-12-12: qty 100

## 2019-12-12 MED ORDER — LIDOCAINE HCL (CARDIAC) PF 100 MG/5ML IV SOSY
PREFILLED_SYRINGE | INTRAVENOUS | Status: DC | PRN
  Administered 2019-12-12: 70 mg via INTRAVENOUS

## 2019-12-12 MED ORDER — MIDAZOLAM HCL 2 MG/2ML IJ SOLN
INTRAMUSCULAR | Status: AC
  Administered 2019-12-12: 1 mg via INTRAVENOUS
  Filled 2019-12-12: qty 2

## 2019-12-12 MED ORDER — ORAL CARE MOUTH RINSE: 15.0000 mL | Freq: Once | OROMUCOSAL | Status: DC

## 2019-12-12 MED ORDER — HYDROMORPHONE HCL 1 MG/ML IJ SOLN: 1.0000 mg | Freq: Once | INTRAMUSCULAR | Status: DC | PRN

## 2019-12-12 MED ORDER — LIDOCAINE HCL (PF) 1 % IJ SOLN
INTRAMUSCULAR | Status: AC
  Filled 2019-12-12: qty 5

## 2019-12-12 MED ORDER — CHLORHEXIDINE GLUCONATE CLOTH 2 % EX PADS: 6.0000 | MEDICATED_PAD | Freq: Once | CUTANEOUS | Status: DC

## 2019-12-12 MED ORDER — PAPAVERINE HCL 30 MG/ML IJ SOLN
INTRAMUSCULAR | Status: AC
  Filled 2019-12-12: qty 2

## 2019-12-12 MED ORDER — CEFAZOLIN SODIUM-DEXTROSE 1-4 GM/50ML-% IV SOLN
1.0000 g | INTRAVENOUS | Status: AC
  Administered 2019-12-12: 1 g via INTRAVENOUS

## 2019-12-12 MED ORDER — HEPARIN SODIUM (PORCINE) 5000 UNIT/ML IJ SOLN
INTRAMUSCULAR | Status: AC
  Filled 2019-12-12: qty 1

## 2019-12-12 MED ORDER — CHLORHEXIDINE GLUCONATE 0.12 % MT SOLN: 15.0000 mL | Freq: Once | OROMUCOSAL | Status: DC

## 2019-12-12 MED ORDER — ACETAMINOPHEN 10 MG/ML IV SOLN
INTRAVENOUS | Status: DC | PRN
  Administered 2019-12-12: 1000 mg via INTRAVENOUS

## 2019-12-12 MED ORDER — MIDAZOLAM HCL 2 MG/2ML IJ SOLN: 1.0000 mg | Freq: Once | INTRAMUSCULAR | Status: AC

## 2019-12-12 MED ORDER — SODIUM CHLORIDE 0.9 % IV SOLN: INTRAVENOUS | Status: DC | PRN

## 2019-12-12 MED ORDER — BUPIVACAINE LIPOSOME 1.3 % IJ SUSP
INTRAMUSCULAR | Status: DC | PRN
  Administered 2019-12-12: 22 mL

## 2019-12-12 MED ORDER — PHENYLEPHRINE HCL (PRESSORS) 10 MG/ML IV SOLN
INTRAVENOUS | Status: DC | PRN
  Administered 2019-12-12 (×2): 100 ug via INTRAVENOUS
  Administered 2019-12-12 (×3): 200 ug via INTRAVENOUS

## 2019-12-12 MED ORDER — ROPIVACAINE HCL 5 MG/ML IJ SOLN
INTRAMUSCULAR | Status: AC
  Filled 2019-12-12: qty 30

## 2019-12-12 MED ORDER — ONDANSETRON HCL 4 MG/2ML IJ SOLN: 4.0000 mg | Freq: Four times a day (QID) | INTRAMUSCULAR | Status: DC | PRN

## 2019-12-12 MED ORDER — PROPOFOL 10 MG/ML IV BOLUS
INTRAVENOUS | Status: AC
  Filled 2019-12-12: qty 20

## 2019-12-12 SURGICAL SUPPLY — 61 items
APPLIER CLIP 11 MED OPEN (CLIP)
APPLIER CLIP 9.375 SM OPEN (CLIP)
BAG COUNTER SPONGE EZ (MISCELLANEOUS) ×2 IMPLANT
BAG DECANTER FOR FLEXI CONT (MISCELLANEOUS) ×3 IMPLANT
BLADE SURG SZ11 CARB STEEL (BLADE) ×3 IMPLANT
BOOT SUTURE AID YELLOW STND (SUTURE) ×3 IMPLANT
BRUSH SCRUB EZ  4% CHG (MISCELLANEOUS) ×2
BRUSH SCRUB EZ 4% CHG (MISCELLANEOUS) ×1 IMPLANT
CANISTER SUCT 1200ML W/VALVE (MISCELLANEOUS) ×3 IMPLANT
CHLORAPREP W/TINT 26 (MISCELLANEOUS) ×3 IMPLANT
CLIP APPLIE 11 MED OPEN (CLIP) IMPLANT
CLIP APPLIE 9.375 SM OPEN (CLIP) IMPLANT
COUNTER SPONGE BAG EZ (MISCELLANEOUS) ×1
COVER WAND RF STERILE (DRAPES) ×3 IMPLANT
DERMABOND ADVANCED (GAUZE/BANDAGES/DRESSINGS) ×2
DERMABOND ADVANCED .7 DNX12 (GAUZE/BANDAGES/DRESSINGS) ×1 IMPLANT
DRESSING SURGICEL FIBRLLR 1X2 (HEMOSTASIS) ×1 IMPLANT
DRSG SURGICEL FIBRILLAR 1X2 (HEMOSTASIS) ×3
ELECT CAUTERY BLADE 6.4 (BLADE) ×3 IMPLANT
ELECT REM PT RETURN 9FT ADLT (ELECTROSURGICAL) ×3
ELECTRODE REM PT RTRN 9FT ADLT (ELECTROSURGICAL) ×1 IMPLANT
GLOVE BIO SURGEON STRL SZ7 (GLOVE) ×3 IMPLANT
GLOVE INDICATOR 7.5 STRL GRN (GLOVE) ×3 IMPLANT
GLOVE SURG SYN 8.0 (GLOVE) ×3 IMPLANT
GLOVE SURG SYN 8.0 PF PI (GLOVE) ×1 IMPLANT
GOWN STRL REUS W/ TWL LRG LVL3 (GOWN DISPOSABLE) ×2 IMPLANT
GOWN STRL REUS W/ TWL XL LVL3 (GOWN DISPOSABLE) ×1 IMPLANT
GOWN STRL REUS W/TWL LRG LVL3 (GOWN DISPOSABLE) ×4
GOWN STRL REUS W/TWL XL LVL3 (GOWN DISPOSABLE) ×2
GRAFT PROPATEN STD WALL 4 7X45 (Vascular Products) ×3 IMPLANT
IV NS 500ML (IV SOLUTION) ×2
IV NS 500ML BAXH (IV SOLUTION) ×1 IMPLANT
KIT TURNOVER KIT A (KITS) ×3 IMPLANT
LABEL OR SOLS (LABEL) ×3 IMPLANT
LOOP RED MAXI  1X406MM (MISCELLANEOUS) ×2
LOOP VESSEL MAXI 1X406 RED (MISCELLANEOUS) ×1 IMPLANT
LOOP VESSEL MINI 0.8X406 BLUE (MISCELLANEOUS) ×2 IMPLANT
LOOPS BLUE MINI 0.8X406MM (MISCELLANEOUS) ×4
NDL FILTER BLUNT 18X1 1/2 (NEEDLE) ×1 IMPLANT
NEEDLE FILTER BLUNT 18X 1/2SAF (NEEDLE) ×2
NEEDLE FILTER BLUNT 18X1 1/2 (NEEDLE) ×1 IMPLANT
NS IRRIG 500ML POUR BTL (IV SOLUTION) ×3 IMPLANT
PACK EXTREMITY (MISCELLANEOUS) ×3 IMPLANT
PAD PREP 24X41 OB/GYN DISP (PERSONAL CARE ITEMS) ×3 IMPLANT
STOCKINETTE 48X4 2 PLY STRL (GAUZE/BANDAGES/DRESSINGS) ×1 IMPLANT
STOCKINETTE STRL 4IN 9604848 (GAUZE/BANDAGES/DRESSINGS) ×3 IMPLANT
SUT GTX CV-6 30 (SUTURE) ×8 IMPLANT
SUT MNCRL+ 5-0 UNDYED PC-3 (SUTURE) ×1 IMPLANT
SUT MONOCRYL 5-0 (SUTURE) ×2
SUT PROLENE 6 0 BV (SUTURE) ×6 IMPLANT
SUT SILK 2 0 (SUTURE) ×2
SUT SILK 2 0 SH (SUTURE) ×3 IMPLANT
SUT SILK 2-0 18XBRD TIE 12 (SUTURE) ×1 IMPLANT
SUT SILK 3 0 (SUTURE) ×2
SUT SILK 3-0 18XBRD TIE 12 (SUTURE) ×1 IMPLANT
SUT SILK 4 0 (SUTURE) ×2
SUT SILK 4-0 18XBRD TIE 12 (SUTURE) ×1 IMPLANT
SUT VIC AB 3-0 SH 27 (SUTURE) ×4
SUT VIC AB 3-0 SH 27X BRD (SUTURE) ×2 IMPLANT
SYR 20ML LL LF (SYRINGE) ×3 IMPLANT
SYR 3ML LL SCALE MARK (SYRINGE) ×3 IMPLANT

## 2019-12-12 NOTE — Op Note (Signed)
OPERATIVE NOTE   PROCEDURE: left brachial axillary arteriovenous graft placement  PRE-OPERATIVE DIAGNOSIS: End Stage Renal Disease  POST-OPERATIVE DIAGNOSIS: End Stage Renal Disease  SURGEON: Hortencia Pilar  ASSISTANT(S): Ms. Hezzie Bump  ANESTHESIA: general  ESTIMATED BLOOD LOSS: <50 cc  FINDING(S): Small artery and a fairly small vein axillary vein was deeper than typically found and tucked more posterior to the muscle  SPECIMEN(S):  none  INDICATIONS:   Misty Berry is a 85 y.o. female who presents with end stage renal disease.  The patient is scheduled for left brachial axillary AV graft placement.  The patient is aware the risks include but are not limited to: bleeding, infection, steal syndrome, nerve damage, ischemic monomelic neuropathy, failure to mature, and need for additional procedures.  The patient is aware of the risks of the procedure and elects to proceed forward.  DESCRIPTION: After full informed written consent was obtained from the patient, the patient was brought back to the operating room and placed supine upon the operating table.  Prior to induction, the patient received IV antibiotics.   After obtaining adequate anesthesia, the patient was then prepped and draped in the standard fashion for a left arm access procedure.   A first assistant was required to provide a safe and appropriate environment for executing the surgery.  The assistant was integral in providing retraction, exposure, running suture providing suction and in the closing process.   A linear incision was then created along the medial border of the biceps muscle just proximal to the antecubital crease and the brachial artery which was exposed through. The brachial artery was then looped proximally and distally with Silastic Vesseloops. Side branches were controlled with 4-0 silk ties.  Attention was then turned to the exposure of the axillary vein. Linear incision was then created  medial to the proximal portion of the biceps at the level of the anterior axillary crease. The axillary vein was exposed and again looped proximally and distally with Silastic vessel loops. Associated tributaries were also controlled with Silastic Vesseloops.  The Gore tunneler was then delivered onto the field and a subcutaneous path was made from the arterial incision to the venous incision. A 4-7 tapered PTFE propatent graft by Simeon Craft was then pulled through the subcutaneous tunnel. The arterial 4 mm portion was then approximated to the brachial artery. Brachial artery was controlled proximally and distally with the Silastic Vesseloops. Arteriotomy was made with an 11 blade scalpel and extended with Potts scissors and a 6-0 Prolene stay suture was placed. End graft to side brachial artery anastomosis was then fashioned with running CV 6 suture. Flushing maneuvers were performed suture line was hemostatic and the graft was then assessed for proper position and ease of future cannulation. Heparinized saline was infused into the vein and the graft was clamped with a Berry clamp. With the graft pressurized it was approximated to the axillary vein in its native bed and then marked with a surgical marker. The vein was then delivered into the surgical field and controlled with the Silastic vessel loops. Venotomy was then made with an 11 blade scalpel and extended with Potts scissors and a 6-0 Prolene suture was used as stay suture. The the graft was then sewn to the vein in an end graft to side vein fashion using running CV 6 suture.  Flushing maneuvers were performed and the artery was allowed to forward and back bleed.  Flow was then established through the AV graft.  Upon release  there was a good thrill and a good bruit however given the location of the vein measurement was a bit more difficult and there was a significant redundancy in the axillary portion of the graft.  I therefore clamped both proximally and  distally flushed with heparinized saline resected approximately 2 cm of the graft and then did a primary end-to-end anastomosis with a CV 6 suture.  Flushing maneuvers were performed and flow was once again reestablished through the AV fistula.  At this point there was a much smoother course to the graft with redundancy now eliminated.  There was good  thrill in the venous outflow, and there was 1+ palpable radial pulse.  At this point, I irrigated out the surgical wounds.  There was no further active bleeding.  The subcutaneous tissue was reapproximated with a running stitch of 3-0 Vicryl.  The skin was then reapproximated with a running subcuticular stitch of 4-0 Vicryl.  The skin was then cleaned, dried, and reinforced with Dermabond.    The patient tolerated this procedure well.   COMPLICATIONS: None  CONDITION: Misty Berry  Office: [phone]   12/12/2019, 4:55 PM

## 2019-12-12 NOTE — Transfer of Care (Signed)
Immediate Anesthesia Transfer of Care Note  Patient: Misty Berry  Procedure(s) Performed: INSERTION OF ARTERIOVENOUS (AV) GORE-TEX GRAFT ARM ( BRACHIAL AXILLARY ) (Left )  Patient Location: PACU  Anesthesia Type:General  Level of Consciousness: drowsy  Airway & Oxygen Therapy: Patient Spontanous Breathing and Patient connected to face mask oxygen  Post-op Assessment: Report given to RN and Post -op Vital signs reviewed and stable  Post vital signs: Reviewed and stable  Last Vitals:  Vitals Value Taken Time  BP 99/46 12/12/19 1648  Temp    Pulse 75 12/12/19 1651  Resp 17 12/12/19 1651  SpO2 99 % 12/12/19 1651  Vitals shown include unvalidated device data.  Last Pain:  Vitals:   12/12/19 1302  TempSrc:   PainSc: 0-No pain         Complications: No complications documented.

## 2019-12-12 NOTE — Anesthesia Postprocedure Evaluation (Signed)
Anesthesia Post Note  Patient: Misty Berry  Procedure(s) Performed: INSERTION OF ARTERIOVENOUS (AV) GORE-TEX GRAFT ARM ( BRACHIAL AXILLARY ) (Left )  Patient location during evaluation: PACU Anesthesia Type: General Level of consciousness: awake and alert and oriented Pain management: pain level controlled Vital Signs Assessment: post-procedure vital signs reviewed and stable Respiratory status: spontaneous breathing Cardiovascular status: blood pressure returned to baseline Anesthetic complications: no   No complications documented.   Last Vitals:  Vitals:   12/12/19 1748 12/12/19 1816  BP: (!) 99/43 95/60  Pulse: 71 77  Resp: 16 18  Temp:  36.7 C  SpO2: (!) 87% 93%    Last Pain:  Vitals:   12/12/19 1816  TempSrc:   PainSc: 0-No pain                 Bethel Gaglio

## 2019-12-12 NOTE — Discharge Instructions (Signed)
Interscalene Nerve Block with Exparel  1.  For your surgery you have received an Interscalene Nerve Block with Exparel. 2. Nerve Blocks affect many types of nerves, including nerves that control movement, pain and normal sensation.  You may experience feelings such as numbness, tingling, heaviness, weakness or the inability to move your arm or the feeling or sensation that your arm has "fallen asleep". 3. A nerve block with Exparel can last up to 5 days.  Usually the weakness wears off first.  The tingling and heaviness usually wear off next.  Finally you may start to notice pain.  Keep in mind that this may occur in any order.  Once a nerve block starts to wear off it is usually completely gone within 60 minutes. 4. ISNB may cause mild shortness of breath, a hoarse voice, blurry vision, unequal pupils, or drooping of the face on the same side as the nerve block.  These symptoms will usually resolve with the numbness.  Very rarely the procedure itself can cause mild seizures. 5. If needed, your surgeon will give you a prescription for pain medication.  It will take about 60 minutes for the oral pain medication to become fully effective.  So, it is recommended that you start taking this medication before the nerve block first begins to wear off, or when you first begin to feel discomfort. 6. Take your pain medication only as prescribed.  Pain medication can cause sedation and decrease your breathing if you take more than you need for the level of pain that you have. 7. Nausea is a common side effect of many pain medications.  You may want to eat something before taking your pain medicine to prevent nausea. 8. After an Interscalene nerve block, you cannot feel pain, pressure or extremes in temperature in the effected arm.  Because your arm is numb it is at an increased risk for injury.  To decrease the possibility of injury, please practice the following:  a. While you are awake change the position of  your arm frequently to prevent too much pressure on any one area for prolonged periods of time. b.  If you have a cast or tight dressing, check the color or your fingers every couple of hours.  Call your surgeon with the appearance of any discoloration (white or blue). c. If you are given a sling to wear before you go home, please wear it  at all times until the block has completely worn off.  Do not get up at night without your sling. d. Please contact [facility] or your surgeon if you do not begin to regain sensation after 7 days from the surgery.  Anesthesia may be contacted by calling the Same Day Surgery Department, Mon. through Fri., 6 am to 4 pm at [phone].   e. If you experience any other problems or concerns, please contact your surgeon's office. f. If you experience severe or prolonged shortness of breath go to the nearest emergency department.   AMBULATORY SURGERY  DISCHARGE INSTRUCTIONS   1) The drugs that you were given will stay in your system until tomorrow so for the next 24 hours you should not:  A) Drive an automobile B) Make any legal decisions C) Drink any alcoholic beverage   2) You may resume regular meals tomorrow.  Today it is better to start with liquids and gradually work up to solid foods.  You may eat anything you prefer, but it is better to start with liquids, then soup  and crackers, and gradually work up to solid foods.   3) Please notify your doctor immediately if you have any unusual bleeding, trouble breathing, redness and pain at the surgery site, drainage, fever, or pain not relieved by medication.    4) Additional Instructions:        Please contact your physician with any problems or Same Day Surgery at [phone], Monday through Friday 6 am to 4 pm, or [place] at [facility] number at [phone].

## 2019-12-12 NOTE — Anesthesia Preprocedure Evaluation (Addendum)
Anesthesia Evaluation  Patient identified by MRN, date of birth, ID band Patient awake    Reviewed: Allergy & Precautions, H&P , NPO status , Patient's Chart, lab work & pertinent test results, reviewed documented beta blocker date and time   History of Anesthesia Complications (+) PONV and history of anesthetic complications  Airway Mallampati: II  TM Distance: >3 FB Neck ROM: full    Dental  (+) Dental Advidsory Given, Caps, Teeth Intact   Pulmonary shortness of breath and with exertion, asthma , neg sleep apnea, neg recent URI,    Pulmonary exam normal breath sounds clear to auscultation       Cardiovascular Exercise Tolerance: Good hypertension, (-) angina+CHF  (-) Past MI and (-) Cardiac Stents Normal cardiovascular exam(-) dysrhythmias (-) Valvular Problems/Murmurs Rhythm:regular Rate:Normal     Neuro/Psych neg Seizures PSYCHIATRIC DISORDERS Depression  Neuromuscular disease    GI/Hepatic Neg liver ROS, GERD  ,  Endo/Other  negative endocrine ROS  Renal/GU CRF, ESRF and DialysisRenal disease  negative genitourinary   Musculoskeletal   Abdominal   Peds  Hematology negative hematology ROS (+)   Anesthesia Other Findings Past Medical History: No date: Asthma No date: Bruises easily No date: Cellulitis No date: CHF (congestive heart failure) (HCC) No date: Cough No date: DDD (degenerative disc disease), lumbar No date: GERD (gastroesophageal reflux disease) No date: HBP (high blood pressure) No date: Hypercholesterolemia No date: Hyperparathyroidism ([place]) No date: Osteoarthritis No date: Osteoporosis No date: Rash No date: Renal disorder     Comment:  kidney disease No date: Swelling   Reproductive/Obstetrics negative OB ROS                             Anesthesia Physical Anesthesia Plan  ASA: IV  Anesthesia Plan: General   Post-op Pain Management:  Regional for Post-op  pain   Induction: Intravenous  PONV Risk Score and Plan: 4 or greater and Ondansetron, Dexamethasone, Treatment may vary due to age or medical condition, Propofol infusion and TIVA  Airway Management Planned: LMA  Additional Equipment:   Intra-op Plan:   Post-operative Plan: Extubation in OR  Informed Consent: I have reviewed the patients History and Physical, chart, labs and discussed the procedure including the risks, benefits and alternatives for the proposed anesthesia with the patient or authorized representative who has indicated his/her understanding and acceptance.     Dental Advisory Given  Plan Discussed with: Anesthesiologist, CRNA and Surgeon  Anesthesia Plan Comments:       Anesthesia Quick Evaluation

## 2019-12-12 NOTE — Patient Instructions (Signed)
Medication Instructions:  No medication changes today.  *If you need a refill on your cardiac medications before your next appointment, please call your pharmacy*   Lab Work: No lab work.  If you have labs (blood work) drawn today and your tests are completely normal, you will receive your results only by: Marland Kitchen MyChart Message (if you have MyChart) OR . A paper copy in the mail If you have any lab test that is abnormal or we need to change your treatment, we will call you to review the results.   Testing/Procedures: EKG today shows sinus rhythm and is stable.   Echo during admission in January showed reduced heart pumping function and normal heart valves.  Stress test in January showed no blockages in your heart arteries.    Follow-Up: At [facility], you and your health needs are our priority.  As part of our continuing mission to provide you with exceptional heart care, we have created designated Provider Care Teams.  These Care Teams include your primary Cardiologist (physician) and Advanced Practice Providers (APPs -  Physician Assistants and Nurse Practitioners) who all work together to provide you with the care you need, when you need it.  We recommend signing up for the patient portal called "MyChart".  Sign up information is provided on this After Visit Summary.  MyChart is used to connect with patients for Virtual Visits (Telemedicine).  Patients are able to view lab/test results, encounter notes, upcoming appointments, etc.  Non-urgent messages can be sent to your provider as well.   To learn more about what you can do with MyChart, go to [email or web address].    Your next appointment:   2 month(s)  The format for your next appointment:   In Person  Provider:    You may see No primary care provider on file. or one of the following Advanced Practice Providers on your designated Care Team:    Murray Hodgkins, NP  Christell Faith, PA-C  Marrianne Mood,  PA-C    Other Instructions   Heart Failure, Diagnosis  Heart failure is a condition in which the heart has trouble pumping blood because it has become weak or stiff. This means that the heart does not pump blood well enough for the body to stay healthy. For some people with heart failure, fluid may back up into the lungs. There may also be swelling (edema) in the lower legs. Heart failure is usually a long-term (chronic) condition. It is important for you to take good care of yourself and follow the treatment plan from your health care provider. What are the causes? This condition may be caused by:  High blood pressure (hypertension). Hypertension causes the heart muscle to work harder than normal. This makes the heart stiff or weak.  Coronary artery disease, or CAD. CAD is the buildup of cholesterol and fat (plaque) in the arteries of the heart.  Heart attack, also called myocardial infarction. This injures the heart muscle, making it hard for the heart to pump blood.  Abnormal heart valves. The valves do not open and close properly, forcing the heart to pump harder to keep the blood flowing.  Heart muscle disease (cardiomyopathy or myocarditis). This is damage to the heart muscle. It can increase the risk of heart failure.  Lung disease. The heart works harder when the lungs are not healthy.  Abnormal heart rhythms. These can lead to heart failure. What increases the risk? The risk of heart failure increases as a person ages. This condition is  also more likely to develop in people who:  Are overweight.  Are female.  Smoke or chew tobacco.  Abuse alcohol or illegal drugs.  Have taken medicines that can damage the heart, such as chemotherapy drugs.  Have diabetes.  Have abnormal heart rhythms.  Have thyroid problems.  Have low blood counts (anemia). What are the signs or symptoms? Symptoms of this condition include:  Shortness of breath with activity, such as when  climbing stairs.  A cough that does not go away.  Swelling of the feet, ankles, legs, or abdomen.  Losing weight for no reason.  Trouble breathing when lying flat (orthopnea).  Waking from sleep because of the need to sit up and get more air.  Rapid heartbeat.  Tiredness (fatigue) and loss of energy.  Feeling light-headed, dizzy, or close to fainting.  Loss of appetite.  Nausea.  Waking up more often during the night to urinate (nocturia).  Confusion. How is this diagnosed? This condition is diagnosed based on:  Your medical history, symptoms, and a physical exam.  Diagnostic tests, which may include: ? Echocardiogram. ? Electrocardiogram (ECG). ? Chest X-ray. ? Blood tests. ? Exercise stress test. ? Radionuclide scans. ? Cardiac catheterization and angiogram. How is this treated? Treatment for this condition is aimed at managing the symptoms of heart failure. Medicines Treatment may include medicines that:  Help lower blood pressure by relaxing (dilating) the blood vessels. These medicines are called ACE inhibitors (angiotensin-converting enzyme) and ARBs (angiotensin receptor blockers).  Cause the kidneys to remove salt and water from the blood through urination (diuretics).  Improve heart muscle strength and prevent the heart from beating too fast (beta blockers).  Increase the force of the heartbeat (digoxin). Healthy behavior changes     Treatment may also include making healthy lifestyle changes, such as:  Reaching and staying at a healthy weight.  Quitting smoking or chewing tobacco.  Eating heart-healthy foods.  Limiting or avoiding alcohol.  Stopping the use of illegal drugs.  Being physically active.  Other treatments Other treatments may include:  Procedures to open blocked arteries or repair damaged valves.  Placing a pacemaker to improve heart function (cardiac resynchronization therapy).  Placing a device to treat serious  abnormal heart rhythms (implantable cardioverter defibrillator, or ICD).  Placing a device to improve the pumping ability of the heart (left ventricular assist device, or LVAD).  Receiving a healthy heart from a donor (heart transplant). This is done when other treatments have not helped. Follow these instructions at home:  Manage other health conditions as told by your health care provider. These may include hypertension, diabetes, thyroid disease, or abnormal heart rhythms.  Get ongoing education and support as needed. Learn as much as you can about heart failure.  Keep all follow-up visits as told by your health care provider. This is important. Summary  Heart failure is a condition in which the heart has trouble pumping blood because it has become weak or stiff.  This condition is caused by high blood pressure and other diseases of the heart and lungs.  Symptoms of this condition include shortness of breath, tiredness (fatigue), nausea, and swelling of the feet, ankles, legs, or abdomen.  Treatments for this condition may include medicines, lifestyle changes, and surgery.  Manage other health conditions as told by your health care provider. This information is not intended to replace advice given to you by your health care provider. Make sure you discuss any questions you have with your health care provider. Document  Revised: 08/18/2018 Document Reviewed: 08/18/2018 Elsevier Patient Education  [place].

## 2019-12-12 NOTE — Anesthesia Procedure Notes (Signed)
Procedure Name: LMA Insertion Performed by: Caryl Asp, CRNA Pre-anesthesia Checklist: Patient identified, Patient being monitored, Timeout performed, Emergency Drugs available and Suction available Patient Re-evaluated:Patient Re-evaluated prior to induction Oxygen Delivery Method: Circle system utilized Preoxygenation: Pre-oxygenation with 100% oxygen Induction Type: IV induction Ventilation: Mask ventilation without difficulty LMA: LMA inserted LMA Size: 4.0 Tube type: Oral Number of attempts: 1 Placement Confirmation: positive ETCO2 and breath sounds checked- equal and bilateral Tube secured with: Tape Dental Injury: Teeth and Oropharynx as per pre-operative assessment

## 2019-12-12 NOTE — Anesthesia Procedure Notes (Signed)
Anesthesia Regional Block: Supraclavicular block   Pre-Anesthetic Checklist: ,, timeout performed, Correct Patient, Correct Site, Correct Laterality, Correct Procedure, Correct Position, site marked, Risks and benefits discussed,  Surgical consent,  Pre-op evaluation,  At surgeon's request and post-op pain management  Laterality: Left and Upper  Prep: Dura Prep       Needles:  Injection technique: Single-shot  Needle Type: Stimiplex     Needle Length: 5cm  Needle Gauge: 22     Additional Needles:   Procedures:,,,, ultrasound used (permanent image in chart),,,,  Narrative:  Start time: 12/12/2019 1:31 PM End time: 12/12/2019 1:34 PM Injection made incrementally with aspirations every 5 mL.  Performed by: Personally  Anesthesiologist: Martha Clan, MD  Additional Notes: Functioning IV was confirmed and monitors were applied.  A 38mm 22ga Stimuplex needle was used. Sterile prep and drape,hand hygiene and sterile gloves were used.  Negative aspiration and negative test dose prior to incremental administration of local anesthetic. The patient tolerated the procedure well.

## 2019-12-12 NOTE — H&P (Signed)
[facility] History & Physical Update  The patient was interviewed and re-examined.  The patient's previous History and Physical has been reviewed and is unchanged.  There is no change in the plan of care. We plan to proceed with the scheduled procedure.  Hortencia Pilar, MD  12/12/2019, 11:48 AM

## 2019-12-12 NOTE — Progress Notes (Signed)
Office Visit    Patient Name: Misty Berry Date of Encounter: 12/12/2019  Primary Care Provider:  [facility] Primary Cardiologist:  No primary care provider on file. Electrophysiologist:  None   Chief Complaint    Misty Berry is a 85 y.o. female with a hx of HTN, HLD HFrEF/NICM, HTN, ESRD  presents today for cardiovascular clearance for surgery.  Past Medical History    Past Medical History:  Diagnosis Date  . Asthma   . Bruises easily   . Cellulitis   . CHF (congestive heart failure) ([place])   . Cough   . DDD (degenerative disc disease), lumbar   . GERD (gastroesophageal reflux disease)   . HBP (high blood pressure)   . Hypercholesterolemia   . Hyperparathyroidism ([place])   . Osteoarthritis   . Osteoporosis   . Rash   . Renal disorder    kidney disease  . Swelling    Past Surgical History:  Procedure Laterality Date  . APPENDECTOMY    . BREAST REDUCTION SURGERY    . CHOLECYSTECTOMY    . DIALYSIS/PERMA CATHETER INSERTION N/A 10/02/2019   Procedure: DIALYSIS/PERMA CATHETER INSERTION;  Surgeon: Katha Cabal, MD;  Location: [facility];  Service: Cardiovascular;  Laterality: N/A;  . EYE SURGERY Left   . FEMUR SURGERY    . FOOT SURGERY Right   . GALLBLADDER SURGERY    . LIPECTOMY    . PAROTID GLAND TUMOR EXCISION    . THYROID SURGERY    . TONSILLECTOMY    . TUBAL LIGATION    . VITRECTOMY      Allergies  Allergies  Allergen Reactions  . Ivp Dye [Iodinated Diagnostic Agents] Other (See Comments)  . Lactose   . Methylprednisolone Other (See Comments)    Reaction: Hallucinations and Psychosis  . Peanuts [Peanut Oil] Other (See Comments)    Swelling and vomitting  . Vancomycin Other (See Comments)    Reaction: unknown  . Wellbutrin [Bupropion] Other (See Comments)    Hallucinations    History of Present Illness    Misty Berry is a 85 y.o. female with a hx of HTN, HLD HFrEF/NICM, HTN, ESRD last seen 10/10/19 by Darylene Price, NP.  No history of CAD or heart attack. She has previously been followed with cardiology [facility].  Admitted 06/2019.  She Lexiscan Myoview during mission showed small defect of moderate severity in apex location likely breast attenuation artifact, intermediate risk study due to reduced EF, no evidence of reversibility cystograms ischemia no perforation deficits to suggest previous infarct.  Echocardiogram 06/27/2019 LVEF 35-40%, LV inferior wall hypokinesis, no significant valvular abnormalities.  Admitted 09/28/2027 due to chronic heart failure with worsening renal function..  Vascular, wound, palliative care consults were obtained.  She was started on dialysis due to worsening renal function. She was discharged to SNF at [facility] and has since returned to [place].  She enjoyed her time participating in physical therapy at [facility] and is hopeful to be able to continue physical therapy therapy at [facility].  She is scheduled for insertion of AV Gore-Tex graft arm today with Dr. Franchot Gallo of vascular.  She was told per [facility] staff member with her that if clearance was granted for procedure today during office visit she could continue to have it done today.  She has been n.p.o. since midnight.  She reports no chest pain, pressure, tightness.  She reports no shortness of breath at rest.  She endorses  dyspnea on exertion only with more than usual activity.  She tells me this is stable at her baseline.   Her lower extremity edema is stable at her baseline.  She has a LLE [place] wrap in place.  Reports no orthopnea, PND.  She does sleep in a recliner and this is her baseline.  We discussed continue to follow with cardiology with [facility] versus [facility] and she prefers to follow with as it is more convenient.  EKGs/Labs/Other Studies Reviewed:   The following studies were reviewed today:  2D Echocardiogram    TTE 06/27/2019 1. Left ventricular ejection fraction, by  visual estimation, is 35 to 40%. The left ventricle has moderately decreased function. There is no left ventricular hypertrophy.  2. Left ventricular diastolic function could not be evaluated.  3. The left ventricle demonstrates regional wall motion abnormalities.  4. LV inferior wall hypokinesis.  5. Global right ventricle was not well visualized.The right ventricular size is not assessed. Right vetricular wall thickness was not assessed.  6. Left atrial size was normal.  7. Right atrial size was not well visualized.  8. The mitral valve is normal in structure. No evidence of mitral valve regurgitation.  9. The tricuspid valve is grossly normal. 10. The aortic valve is grossly normal. Aortic valve regurgitation is not visualized. 11. Pulmonic regurgitation not assessed. 12. The pulmonic valve was not well visualized. Pulmonic valve regurgitation not assessed. 13. The interatrial septum was not well visualized.  Lexiscan Myoview 06/2019 Defect 1: There is a small defect of moderate severity present in the apex location. This is likely due to breast attenuation artifact This is an intermediate risk study due to reduced EF The left ventricular ejection fraction is moderately decreased (30-44%). No evidence of reversibility to suggest ischemia and no large perfusion defects to suggest previous infarct. Suspect nonischemic cardiomyopathy.  EKG:  EKG is  ordered today.  The ekg ordered today demonstrates normal sinus rhythm 65 bpm with LVH.  No acute ST/T wave changes.  Recent Labs: 06/27/2019: ALT 12; TSH 1.138 10/01/2019: B Natriuretic Peptide 690.0 12/10/2019: BUN 38; Creatinine, Ser 4.30; Hemoglobin 10.8; Platelets 236; Potassium 4.6; Sodium 141  Recent Lipid Panel    Component Value Date/Time   TRIG 224 (H) 02/07/2013 1140    Home Medications   Current Meds  Medication Sig  . aspirin 81 MG chewable tablet Chew 81 mg by mouth daily. (0800)  . bumetanide (BUMEX) 1 MG tablet Take 1  tablet (1 mg total) by mouth daily. (Patient taking differently: Take 1 mg by mouth daily. (0800))  . busPIRone (BUSPAR) 5 MG tablet Take 5 mg by mouth daily. (0800)  . calcium-vitamin D (OSCAL WITH D) 500-200 MG-UNIT tablet Take 1 tablet by mouth daily.  Marland Kitchen docusate sodium (COLACE) 100 MG capsule Take 1 capsule (100 mg total) by mouth 2 (two) times daily.  . Ergocalciferol (VITAMIN D2) 50 MCG (2000 UT) TABS Take 1 tablet by mouth daily.  Marland Kitchen esomeprazole (NEXIUM) 40 MG capsule Take 40 mg by mouth daily. (0800)  . ferrous sulfate 325 (65 FE) MG tablet Take 325 mg by mouth daily with breakfast.  . hydrALAZINE (APRESOLINE) 25 MG tablet Take 1 tablet (25 mg total) by mouth every 8 (eight) hours. (Patient taking differently: Take 25 mg by mouth 3 (three) times daily. 0800, 1400, 2000)  . losartan (COZAAR) 50 MG tablet Take 1 tablet (50 mg total) by mouth daily. (Patient taking differently: Take 50 mg by mouth daily. (0800))  .  metoprolol tartrate (LOPRESSOR) 25 MG tablet Take 25 mg by mouth daily.  . Multiple Vitamins-Minerals (CENTRUM SILVER PO) Take 1 tablet by mouth daily.  . Omega-3 Fatty Acids (FISH OIL) 1000 MG CAPS Take 4 capsules by mouth daily.  . sertraline (ZOLOFT) 100 MG tablet Take 100 mg by mouth every morning. 0800  . Skin Protectants, Misc. (BAZA PROTECT EX) Apply 1 application topically every 4 (four) hours. (2000)   . sulfamethoxazole-trimethoprim (BACTRIM DS) 800-160 MG tablet Take 1 tablet by mouth daily.  Marland Kitchen sulfamethoxazole-trimethoprim (BACTRIM) 400-80 MG tablet Take 1 tablet by mouth daily.  . traMADol (ULTRAM) 50 MG tablet Take 1 tablet (50 mg total) by mouth every 6 (six) hours as needed for moderate pain.    Review of Systems      Review of Systems  Constitutional: Negative for chills, fever and malaise/fatigue.  Cardiovascular: Positive for dyspnea on exertion. Negative for chest pain, leg swelling, near-syncope, orthopnea, palpitations and syncope.  Respiratory: Negative  for cough, shortness of breath and wheezing.   Gastrointestinal: Negative for nausea and vomiting.  Neurological: Negative for dizziness, light-headedness and weakness.   All other systems reviewed and are otherwise negative except as noted above.  Physical Exam    VS:  BP 124/68 (BP Location: Left Arm, Patient Position: Sitting, Cuff Size: Normal)   Pulse 65   Ht 5\' 2"  (1.575 m)   Wt 194 lb 2 oz (88.1 kg) Comment: 11/20/2019 checked at [facility] care  SpO2 96%   BMI 35.51 kg/m  , BMI Body mass index is 35.51 kg/m. GEN: Well nourished, overweight, well developed, in no acute distress. HEENT: normal. Neck: Supple, no JVD, carotid bruits, or masses. Cardiac: RRR, no murmurs, rubs, or gallops. No clubbing, cyanosis.  Radials/DP/PT 2+ and equal bilaterally. RLE with edema and mild erythema. Respiratory:  Respirations regular and unlabored, clear to auscultation bilaterally. GI: Soft, nontender, nondistended, BS + x 4. MS: No deformity or atrophy. Skin: Warm and dry, no rash.  LLE in [employer]. Neuro:  Strength and sensation are intact. Psych: Normal affect.  Assessment & Plan    1. Preoperative cardiovascular examination -Presents today for cardiovascular clearance for AV graft insertion.  She reports no symptoms concerning for angina has no history of coronary disease.  She does have history of HFrEF/ICM with volume status being managed by HD.  EKG today without acute ST/T wave changes.  She had echocardiogram 06/2019 showing LVEF 35 to 40%, no significant valvular maladies.  Lexiscan Myoview 06/2019 intermediate risk study due to reduced EF with no evidence of responsibilities to suggest ischemia.  Her activity level is difficult to assess as she is primarily wheelchair-bound at baseline. According to the Revised Cardiac Risk Index (RCRI), her Perioperative Risk of Major Cardiac Event is (%): 0.9.  She has no anginal symptoms and had reassuring Lexiscan Myoview within the last year, she  is deemed acceptable risk for the planned procedure without additional cardiovascular testing.  Recommend careful utilization of IVF during procedure due to ESRD and HFrEF.  Discussed with Overton Mam, NP and she will be sent to short stay to have her procedure done today.  2. ESRD - Continue to follow with nephrology and hemodialysis.  Heart failure therapy somewhat limited by ESRD.  Avoid nephrotoxic agents.  3. HFrEF/NICM -NYHA II with dyspnea on exertion.  LVEF 35-40%, low risk stress test in January indicate nonischemic cardiomyopathy.  Heart failure therapies limited by ESRD.  Continue hydralazine, losartan, metoprolol.  Consider additional heart  failure therapies at follow-up as able.  Encourage low-sodium, heart diet.  Encourage fluid restriction less than 2 L..  Disposition: Follow up in 2 month(s) with Dr. Justice Deeds, NP 12/12/2019, 10:13 AM

## 2019-12-13 ENCOUNTER — Encounter (INDEPENDENT_AMBULATORY_CARE_PROVIDER_SITE_OTHER): Payer: Self-pay | Admitting: Vascular Surgery

## 2019-12-14 ENCOUNTER — Other Ambulatory Visit: Payer: Self-pay

## 2019-12-14 ENCOUNTER — Non-Acute Institutional Stay: Payer: Medicare Other | Admitting: Adult Health Nurse Practitioner

## 2019-12-14 DIAGNOSIS — Z515 Encounter for palliative care: Secondary | ICD-10-CM

## 2019-12-14 DIAGNOSIS — I5040 Unspecified combined systolic (congestive) and diastolic (congestive) heart failure: Secondary | ICD-10-CM

## 2019-12-14 NOTE — Progress Notes (Signed)
[facility] Consult Note Telephone: [phone]  Fax: [phone]  PATIENT NAME: Misty Berry DOB: [birth year] MRN: [id number]  PRIMARY CARE PROVIDER:   Orvis Brill, [facility]  REFERRING PROVIDER:  [facility] [address] [place],  [place] [id number]  RESPONSIBLE PARTY:   Beckie Salts brother [phone]       RECOMMENDATIONS and PLAN:  1.  Advanced care planning. Patient is a DNR. Will call brother to update on visit.   2.  ESRD.  Patient has port to right upper chest in which she is receiving dialysis. On 12/12/19 she had AV fistula placed to left upper arm with bruit heard.  She has dialysis on T,Th,Sat. She states dialysis is going well.  Staff does report that she has missed one dialysis session and asked to be unhooked early another time because she does not like sitting there for that long. Continue dialysis and monitoring of new AV fistula site for healing.   3.  Functional/nutritional status.  She can walk short distances with a walker otherwise uses wheelchair.  Requires assistance with ADLs but is able to feed herself.  No reported falls.  Appetite good with no reported weight loss.  Current weight 194 pounds with BMI of 35.51.  Continue supportive care at facility  4.  Dementia.  Patient is able to hold a conversation and make her needs known.  She is alert and oriented to person and place.  Able to feed herself with no reported weight loss, see above.  Continue supportive care care at facility  5. CHF. Patient has significant edema to bilateral lower extremities. Staff reports that she was having weeping to left leg and wraps were started.  She denies increased SOB or cough, orthopnea, chest pain.  Is on HD for ESRD. Is being followed by cardiology. Continue follow up and recommendations by cardiology  Palliative care will continue to monitor for symptom management/decline and make recommendations  as needed.  Will follow up in 4-6 weeks.  I spent 50 minutes providing this consultation,  from 10:30 to 11:20 . More than 50% of the time in this consultation was spent coordinating communication.   HISTORY OF PRESENT ILLNESS:  Misty Berry is a 85 y.o. year old female with multiple medical problems including ESRD on HD, CHF, dementia, asthma asthma, DDD of lumbar spine, HTN, GERD, OA, hyperparathyroidism.  Patient was in hospital in April of this year due to inability to walk due to it due to increased shortness of breath. She was at [facility] for short term rehab and is now back at [facility]. She is currently being treated for a UTI with bactrim.  She denies dysuria, frequency, urgency, fever, hematuria.  Palliative Care was asked to help address goals of care.   CODE STATUS: DNR  PPS: 40% HOSPICE ELIGIBILITY/DIAGNOSIS: TBD  PHYSICAL EXAM:  BP 122/68 HR 81 O2 92% on room air General: NAD, frail appearing Cardiovascular: regular rate and rhythm Pulmonary: clear ant fields Abdomen: soft, nontender, + bowel sounds GU: no suprapubic tenderness Extremities: Significant edema noted to bilateral lower extremities, patient has wraps to left leg due to weeping, no joint deformities Skin: no rashes on exposed skin Neurological: Weakness; alert and oriented to person and place   PAST MEDICAL HISTORY:  Past Medical History:  Diagnosis Date  . Asthma   . Bruises easily   . Cellulitis   . CHF (congestive heart failure) ([place])   .  Cough   . DDD (degenerative disc disease), lumbar   . GERD (gastroesophageal reflux disease)   . HBP (high blood pressure)   . Hypercholesterolemia   . Hyperparathyroidism ([place])   . Osteoarthritis   . Osteoporosis   . Rash   . Renal disorder    kidney disease  . Swelling     SOCIAL HX:  Social History   Tobacco Use  . Smoking status: Never Smoker  . Smokeless tobacco: Never Used  Substance Use Topics  . Alcohol use: No     ALLERGIES:  Allergies  Allergen Reactions  . Ivp Dye [Iodinated Diagnostic Agents] Other (See Comments)  . Lactose   . Methylprednisolone Other (See Comments)    Reaction: Hallucinations and Psychosis  . Peanuts [Peanut Oil] Other (See Comments)    Swelling and vomitting  . Vancomycin Other (See Comments)    Reaction: unknown  . Wellbutrin [Bupropion] Other (See Comments)    Hallucinations     PERTINENT MEDICATIONS:  Outpatient Encounter Medications as of 12/14/2019  Medication Sig  . acidophilus (RISAQUAD) CAPS capsule Take 2 capsules by mouth 3 (three) times daily. (Patient taking differently: Take 2 capsules by mouth 3 (three) times daily. (0800, 1400, & 2000))  . aspirin 81 MG chewable tablet Chew 81 mg by mouth daily. (0800)  . bumetanide (BUMEX) 1 MG tablet Take 1 tablet (1 mg total) by mouth daily. (Patient taking differently: Take 1 mg by mouth daily. (0800))  . busPIRone (BUSPAR) 5 MG tablet Take 5 mg by mouth daily. (0800)  . calcium-vitamin D (OSCAL WITH D) 500-200 MG-UNIT tablet Take 1 tablet by mouth daily.  Marland Kitchen docusate sodium (COLACE) 100 MG capsule Take 1 capsule (100 mg total) by mouth 2 (two) times daily.  . Ergocalciferol (VITAMIN D2) 50 MCG (2000 UT) TABS Take 1 tablet by mouth daily.  Marland Kitchen esomeprazole (NEXIUM) 40 MG capsule Take 40 mg by mouth daily. (0800)  . ferrous sulfate 325 (65 FE) MG tablet Take 325 mg by mouth daily with breakfast.  . hydrALAZINE (APRESOLINE) 25 MG tablet Take 1 tablet (25 mg total) by mouth every 8 (eight) hours. (Patient not taking: Reported on 12/12/2019)  . HYDROcodone-acetaminophen (NORCO) 5-325 MG tablet Take 1-2 tablets by mouth every 6 (six) hours as needed for moderate pain or severe pain.  Marland Kitchen losartan (COZAAR) 50 MG tablet Take 1 tablet (50 mg total) by mouth daily. (Patient taking differently: Take 50 mg by mouth daily. (0800))  . metoprolol tartrate (LOPRESSOR) 25 MG tablet Take 25 mg by mouth daily.  . Multiple Vitamins-Minerals  (CENTRUM SILVER PO) Take 1 tablet by mouth daily.  . Omega-3 Fatty Acids (FISH OIL) 1000 MG CAPS Take 4 capsules by mouth daily.  . sertraline (ZOLOFT) 100 MG tablet Take 100 mg by mouth every morning. 0800  . Skin Protectants, Misc. (BAZA PROTECT EX) Apply 1 application topically every 4 (four) hours. (2000)  (Patient not taking: Reported on 12/12/2019)  . sulfamethoxazole-trimethoprim (BACTRIM DS) 800-160 MG tablet Take 1 tablet by mouth daily.  Marland Kitchen sulfamethoxazole-trimethoprim (BACTRIM) 400-80 MG tablet Take 1 tablet by mouth daily. (Patient not taking: Reported on 12/12/2019)  . traMADol (ULTRAM) 50 MG tablet Take 1 tablet (50 mg total) by mouth every 6 (six) hours as needed for moderate pain.   No facility-administered encounter medications on file as of 12/14/2019.     Mickael Mcnutt Jenetta Downer, NP

## 2019-12-18 ENCOUNTER — Telehealth: Payer: Self-pay | Admitting: Adult Health Nurse Practitioner

## 2019-12-18 NOTE — Telephone Encounter (Signed)
Called to update brother on visit with his sister.  Stated he was on another call and asked to call me back.  Told him that if he called back on main line to ask for [employer]. Pinkey Mcjunkin K. Olena Heckle NP

## 2019-12-28 ENCOUNTER — Telehealth (INDEPENDENT_AMBULATORY_CARE_PROVIDER_SITE_OTHER): Payer: Self-pay

## 2019-12-28 NOTE — Telephone Encounter (Signed)
Received an order yesterday regarding the patient to have a cath exchange. Misty Berry was called and it was explained that the Vascular lab was down and will be down until Monday. It was explained that other arrangements will need to be made.

## 2019-12-29 DIAGNOSIS — Z87448 Personal history of other diseases of urinary system: Secondary | ICD-10-CM | POA: Insufficient documentation

## 2020-01-10 ENCOUNTER — Non-Acute Institutional Stay: Payer: Medicare Other | Admitting: Adult Health Nurse Practitioner

## 2020-01-10 ENCOUNTER — Other Ambulatory Visit: Payer: Self-pay

## 2020-01-10 DIAGNOSIS — I5040 Unspecified combined systolic (congestive) and diastolic (congestive) heart failure: Secondary | ICD-10-CM

## 2020-01-10 DIAGNOSIS — Z515 Encounter for palliative care: Secondary | ICD-10-CM

## 2020-01-10 NOTE — Progress Notes (Signed)
[facility] Consult Note Telephone: [phone]  Fax: [phone]  PATIENT NAME: Misty Berry DOB: 1934-12-28 MRN: [id number]  PRIMARY CARE PROVIDER:   Orvis Br[facility], [facility]  REFERRING PROVIDER:  [facility] [address] [place],  [place] [address]  RESPONSIBLE PARTY:   Beckie Salts brother [phone]          RECOMMENDATIONS and PLAN:  1.  Advanced care planning. Patient is a DNR. Spoke with brother to update on visit.    2.  ESRD.  Patient has dialysis T, TH, SAT.  Patient was in the ED on 12/05/7626 for complication with Port-A-Cath for dialysis access.  Port-A-Cath was replaced.  Patient has AV fistula to left upper arm which is still maturing.  Stitches are still in place with no redness, drainage, warmth to touch noted.  Patient states that dialysis has been going well.  Continue dialysis as ordered and follow-up with nephrology.  3.  Dementia.  Patient is able to walk short distances with a walker but otherwise uses a wheelchair.  Requires assistance with ADLs but is able to feed herself.  No reported falls.  Appetite is good with no reported weight loss.  Current weight is 200 pounds with BMI of 36.65.  Patient is able to hold a conversation and make her needs known.  She is alert and oriented to person and place.  Continue supportive care at facility.  4.  Pain.  Patient does complain of pain in her knees when she walks today.  States that the pain is relieved when she sits down.  Spoke with brother who states that Mardene Celeste from [facility] had just called him this morning and stated that patient was complaining of unrelieved pain in her knees and was asking if it was okay to send her to the hospital if she continued to complain of the pain in her knees.  She does have as needed Tylenol and tramadol for the pain.  We will continue to assess pain at each visit.  Palliative care will continue to monitor  for symptom management/decline and make recommendations as needed.  We will follow up in 4 to 6 weeks.  Brother is encouraged to call with any questions or concerns  I spent 50 minutes providing this consultation,  from 9:00 to 9:50 including time spent with patient, chart review, provider coordination, documentation. More than 50% of the time in this consultation was spent coordinating communication.   HISTORY OF PRESENT ILLNESS:  Misty Berry is a 85 y.o. year old female with multiple medical problems including ESRD on HD, CHF, dementia, asthma asthma, DDD of lumbar spine, HTN, GERD, OA, hyperparathyroidism. Palliative Care was asked to help address goals of care. Denies SOB, cough, headaches, dizziness, N/V/D, constipation, fever, dysuria, hematuria, chest pain, palpitations.  CODE STATUS: DNR  PPS: 40% HOSPICE ELIGIBILITY/DIAGNOSIS: TBD  PHYSICAL EXAM:  HR 70 O2 98% on room air General: NAD, frail appearing Cardiovascular: regular rate and rhythm Pulmonary: clear ant fields Abdomen: soft, nontender, + bowel sounds GU: no suprapubic tenderness Extremities: 1-2+ edema noted to bilateral lower extremities, no weeping noted, no joint deformities Skin: no rashes on exposed skin Neurological: Weakness; alert and oriented to person and place  PAST MEDICAL HISTORY:  Past Medical History:  Diagnosis Date   Asthma    Bruises easily    Cellulitis    CHF (congestive heart failure) (HCC)    Cough    DDD (degenerative disc disease),  lumbar    GERD (gastroesophageal reflux disease)    HBP (high blood pressure)    Hypercholesterolemia    Hyperparathyroidism (HCC)    Osteoarthritis    Osteoporosis    Rash    Renal disorder    kidney disease   Swelling     SOCIAL HX:  Social History   Tobacco Use   Smoking status: Never Smoker   Smokeless tobacco: Never Used  Substance Use Topics   Alcohol use: No    ALLERGIES:  Allergies  Allergen Reactions   Ivp Dye  [Iodinated Diagnostic Agents] Other (See Comments)   Lactose    Methylprednisolone Other (See Comments)    Reaction: Hallucinations and Psychosis   Peanuts [Peanut Oil] Other (See Comments)    Swelling and vomitting   Vancomycin Other (See Comments)    Reaction: unknown   Wellbutrin [Bupropion] Other (See Comments)    Hallucinations     PERTINENT MEDICATIONS:  Outpatient Encounter Medications as of 01/10/2020  Medication Sig   acidophilus (RISAQUAD) CAPS capsule Take 2 capsules by mouth 3 (three) times daily. (Patient taking differently: Take 2 capsules by mouth 3 (three) times daily. (0800, 1400, & 2000))   aspirin 81 MG chewable tablet Chew 81 mg by mouth daily. (0800)   bumetanide (BUMEX) 1 MG tablet Take 1 tablet (1 mg total) by mouth daily. (Patient taking differently: Take 1 mg by mouth daily. (0800))   busPIRone (BUSPAR) 5 MG tablet Take 5 mg by mouth daily. (0800)   calcium-vitamin D (OSCAL WITH D) 500-200 MG-UNIT tablet Take 1 tablet by mouth daily.   docusate sodium (COLACE) 100 MG capsule Take 1 capsule (100 mg total) by mouth 2 (two) times daily.   Ergocalciferol (VITAMIN D2) 50 MCG (2000 UT) TABS Take 1 tablet by mouth daily.   esomeprazole (NEXIUM) 40 MG capsule Take 40 mg by mouth daily. (0800)   ferrous sulfate 325 (65 FE) MG tablet Take 325 mg by mouth daily with breakfast.   hydrALAZINE (APRESOLINE) 25 MG tablet Take 1 tablet (25 mg total) by mouth every 8 (eight) hours. (Patient not taking: Reported on 12/12/2019)   HYDROcodone-acetaminophen (NORCO) 5-325 MG tablet Take 1-2 tablets by mouth every 6 (six) hours as needed for moderate pain or severe pain.   losartan (COZAAR) 50 MG tablet Take 1 tablet (50 mg total) by mouth daily. (Patient taking differently: Take 50 mg by mouth daily. (0800))   metoprolol tartrate (LOPRESSOR) 25 MG tablet Take 25 mg by mouth daily.   Multiple Vitamins-Minerals (CENTRUM SILVER PO) Take 1 tablet by mouth daily.   Omega-3  Fatty Acids (FISH OIL) 1000 MG CAPS Take 4 capsules by mouth daily.   sertraline (ZOLOFT) 100 MG tablet Take 100 mg by mouth every morning. 0800   Skin Protectants, Misc. (BAZA PROTECT EX) Apply 1 application topically every 4 (four) hours. (2000)  (Patient not taking: Reported on 12/12/2019)   sulfamethoxazole-trimethoprim (BACTRIM DS) 800-160 MG tablet Take 1 tablet by mouth daily.   sulfamethoxazole-trimethoprim (BACTRIM) 400-80 MG tablet Take 1 tablet by mouth daily. (Patient not taking: Reported on 12/12/2019)   traMADol (ULTRAM) 50 MG tablet Take 1 tablet (50 mg total) by mouth every 6 (six) hours as needed for moderate pain.   No facility-administered encounter medications on file as of 01/10/2020.     Manjit Bufano Jenetta Downer, NP

## 2020-01-15 ENCOUNTER — Telehealth (INDEPENDENT_AMBULATORY_CARE_PROVIDER_SITE_OTHER): Payer: Self-pay

## 2020-01-15 NOTE — Telephone Encounter (Signed)
A fax was received from [facility] wanting the patient to have a left arm graft declot. Patient is scheduled with Dr. Delana Meyer on 01/16/20 with a 11:15 am arrival time to the [facility]. Covid testing is on 01/16/20 at 8:00 am at the [facility] before the procedure. Pre-procedure instructions will be faxed to [facility] at [facility] per the request. I did speak with Benjamine Mola in Memory care regarding this appt, she stated no one told them or transportation and I informed her that this was just given to me to have her scheduled.

## 2020-01-16 ENCOUNTER — Ambulatory Visit
Admission: RE | Admit: 2020-01-16 | Discharge: 2020-01-16 | Disposition: A | Discharge status: Home / Self Care | Payer: Medicare Other | Attending: Vascular Surgery | Admitting: Vascular Surgery

## 2020-01-16 ENCOUNTER — Other Ambulatory Visit: Payer: Self-pay

## 2020-01-16 ENCOUNTER — Other Ambulatory Visit (INDEPENDENT_AMBULATORY_CARE_PROVIDER_SITE_OTHER): Payer: Self-pay | Admitting: Nurse Practitioner

## 2020-01-16 ENCOUNTER — Encounter
Admission: RE | Disposition: A | Discharge status: Home / Self Care | Payer: Self-pay | Source: Home / Self Care | Attending: Vascular Surgery

## 2020-01-16 ENCOUNTER — Other Ambulatory Visit
Admission: RE | Admit: 2020-01-16 | Discharge: 2020-01-16 | Disposition: A | Discharge status: Home / Self Care | Payer: Medicare Other | Source: Ambulatory Visit | Attending: Vascular Surgery | Admitting: Vascular Surgery

## 2020-01-16 ENCOUNTER — Encounter (INDEPENDENT_AMBULATORY_CARE_PROVIDER_SITE_OTHER): Payer: Medicare Other

## 2020-01-16 ENCOUNTER — Ambulatory Visit (INDEPENDENT_AMBULATORY_CARE_PROVIDER_SITE_OTHER): Payer: Medicare Other | Admitting: Nurse Practitioner

## 2020-01-16 ENCOUNTER — Encounter: Payer: Self-pay | Admitting: Vascular Surgery

## 2020-01-16 DIAGNOSIS — Z881 Allergy status to other antibiotic agents status: Secondary | ICD-10-CM | POA: Insufficient documentation

## 2020-01-16 DIAGNOSIS — Z992 Dependence on renal dialysis: Secondary | ICD-10-CM | POA: Insufficient documentation

## 2020-01-16 DIAGNOSIS — Z91010 Allergy to peanuts: Secondary | ICD-10-CM | POA: Insufficient documentation

## 2020-01-16 DIAGNOSIS — T82868A Thrombosis of vascular prosthetic devices, implants and grafts, initial encounter: Secondary | ICD-10-CM | POA: Insufficient documentation

## 2020-01-16 DIAGNOSIS — Z888 Allergy status to other drugs, medicaments and biological substances status: Secondary | ICD-10-CM | POA: Diagnosis not present

## 2020-01-16 DIAGNOSIS — E7800 Pure hypercholesterolemia, unspecified: Secondary | ICD-10-CM | POA: Insufficient documentation

## 2020-01-16 DIAGNOSIS — Z20822 Contact with and (suspected) exposure to covid-19: Secondary | ICD-10-CM | POA: Diagnosis not present

## 2020-01-16 DIAGNOSIS — K219 Gastro-esophageal reflux disease without esophagitis: Secondary | ICD-10-CM | POA: Insufficient documentation

## 2020-01-16 DIAGNOSIS — N186 End stage renal disease: Secondary | ICD-10-CM | POA: Insufficient documentation

## 2020-01-16 DIAGNOSIS — J45909 Unspecified asthma, uncomplicated: Secondary | ICD-10-CM | POA: Insufficient documentation

## 2020-01-16 DIAGNOSIS — Z91041 Radiographic dye allergy status: Secondary | ICD-10-CM | POA: Insufficient documentation

## 2020-01-16 DIAGNOSIS — E213 Hyperparathyroidism, unspecified: Secondary | ICD-10-CM | POA: Diagnosis not present

## 2020-01-16 DIAGNOSIS — Y841 Kidney dialysis as the cause of abnormal reaction of the patient, or of later complication, without mention of misadventure at the time of the procedure: Secondary | ICD-10-CM | POA: Diagnosis not present

## 2020-01-16 DIAGNOSIS — Y832 Surgical operation with anastomosis, bypass or graft as the cause of abnormal reaction of the patient, or of later complication, without mention of misadventure at the time of the procedure: Secondary | ICD-10-CM

## 2020-01-16 DIAGNOSIS — I509 Heart failure, unspecified: Secondary | ICD-10-CM | POA: Diagnosis not present

## 2020-01-16 DIAGNOSIS — I132 Hypertensive heart and chronic kidney disease with heart failure and with stage 5 chronic kidney disease, or end stage renal disease: Secondary | ICD-10-CM | POA: Insufficient documentation

## 2020-01-16 DIAGNOSIS — T82858A Stenosis of vascular prosthetic devices, implants and grafts, initial encounter: Secondary | ICD-10-CM

## 2020-01-16 HISTORY — PX: PERIPHERAL VASCULAR THROMBECTOMY: CATH118306

## 2020-01-16 LAB — POTASSIUM (ARMC VASCULAR LAB ONLY): Potassium (ARMC vascular lab): 4.8 (ref 3.5–5.1)

## 2020-01-16 LAB — SARS CORONAVIRUS 2 BY RT PCR (HOSPITAL ORDER, PERFORMED IN ~~LOC~~ HOSPITAL LAB): SARS Coronavirus 2: NEGATIVE

## 2020-01-16 SURGERY — PERIPHERAL VASCULAR THROMBECTOMY
Anesthesia: Moderate Sedation | Laterality: Left

## 2020-01-16 MED ORDER — DIPHENHYDRAMINE HCL 50 MG/ML IJ SOLN
INTRAMUSCULAR | Status: AC
  Filled 2020-01-16: qty 1

## 2020-01-16 MED ORDER — DIPHENHYDRAMINE HCL 50 MG/ML IJ SOLN
50.0000 mg | Freq: Once | INTRAMUSCULAR | Status: AC | PRN
  Administered 2020-01-16: 50 mg via INTRAVENOUS

## 2020-01-16 MED ORDER — METHYLPREDNISOLONE SODIUM SUCC 125 MG IJ SOLR
INTRAMUSCULAR | Status: AC
  Filled 2020-01-16: qty 2

## 2020-01-16 MED ORDER — MIDAZOLAM HCL 2 MG/ML PO SYRP: 8.0000 mg | ORAL_SOLUTION | Freq: Once | ORAL | Status: DC | PRN

## 2020-01-16 MED ORDER — HYDROMORPHONE HCL 1 MG/ML IJ SOLN: 1.0000 mg | Freq: Once | INTRAMUSCULAR | Status: DC | PRN

## 2020-01-16 MED ORDER — METHYLPREDNISOLONE SODIUM SUCC 125 MG IJ SOLR
125.0000 mg | Freq: Once | INTRAMUSCULAR | Status: AC | PRN
  Administered 2020-01-16: 125 mg via INTRAVENOUS

## 2020-01-16 MED ORDER — CEFAZOLIN SODIUM-DEXTROSE 1-4 GM/50ML-% IV SOLN
INTRAVENOUS | Status: AC
  Administered 2020-01-16: 1 g via INTRAVENOUS
  Filled 2020-01-16: qty 50

## 2020-01-16 MED ORDER — FAMOTIDINE 20 MG PO TABS
40.0000 mg | ORAL_TABLET | Freq: Once | ORAL | Status: AC | PRN
  Administered 2020-01-16: 40 mg via ORAL

## 2020-01-16 MED ORDER — FENTANYL CITRATE (PF) 100 MCG/2ML IJ SOLN
INTRAMUSCULAR | Status: AC
  Filled 2020-01-16: qty 2

## 2020-01-16 MED ORDER — CEFAZOLIN SODIUM-DEXTROSE 1-4 GM/50ML-% IV SOLN: 1.0000 g | Freq: Once | INTRAVENOUS | Status: AC

## 2020-01-16 MED ORDER — ONDANSETRON HCL 4 MG/2ML IJ SOLN: 4.0000 mg | Freq: Four times a day (QID) | INTRAMUSCULAR | Status: DC | PRN

## 2020-01-16 MED ORDER — MIDAZOLAM HCL 5 MG/5ML IJ SOLN
INTRAMUSCULAR | Status: AC
  Filled 2020-01-16: qty 5

## 2020-01-16 MED ORDER — FENTANYL CITRATE (PF) 100 MCG/2ML IJ SOLN
INTRAMUSCULAR | Status: DC | PRN
  Administered 2020-01-16 (×2): 50 ug via INTRAVENOUS

## 2020-01-16 MED ORDER — IODIXANOL 320 MG/ML IV SOLN
INTRAVENOUS | Status: DC | PRN
  Administered 2020-01-16: 35 mL

## 2020-01-16 MED ORDER — SODIUM CHLORIDE 0.9 % IV SOLN: INTRAVENOUS | Status: DC

## 2020-01-16 MED ORDER — FAMOTIDINE 20 MG PO TABS
ORAL_TABLET | ORAL | Status: AC
  Filled 2020-01-16: qty 2

## 2020-01-16 MED ORDER — HEPARIN SODIUM (PORCINE) 1000 UNIT/ML IJ SOLN
INTRAMUSCULAR | Status: DC | PRN
  Administered 2020-01-16: 4000 [IU] via INTRAVENOUS

## 2020-01-16 MED ORDER — HEPARIN SODIUM (PORCINE) 1000 UNIT/ML IJ SOLN
INTRAMUSCULAR | Status: AC
  Filled 2020-01-16: qty 1

## 2020-01-16 MED ORDER — MIDAZOLAM HCL 2 MG/2ML IJ SOLN
INTRAMUSCULAR | Status: DC | PRN
  Administered 2020-01-16: 1 mg via INTRAVENOUS
  Administered 2020-01-16: 2 mg via INTRAVENOUS

## 2020-01-16 SURGICAL SUPPLY — 20 items
BALLN ULTRVRSE  6X40X75C (BALLOONS) ×2
BALLN ULTRVRSE 6X40X75C (BALLOONS) ×1
BALLN ULTRVRSE 8X60X75C (BALLOONS) ×3
BALLOON ULTRVRSE 6X40X75C (BALLOONS) ×1 IMPLANT
BALLOON ULTRVRSE 8X60X75C (BALLOONS) ×1 IMPLANT
CATH BEACON 5 .035 65 KMP TIP (CATHETERS) ×3 IMPLANT
DEVICE PRESTO INFLATION (MISCELLANEOUS) ×3 IMPLANT
DRAPE BRACHIAL (DRAPES) ×3 IMPLANT
GUIDEWIRE ANGLED .035 180CM (WIRE) ×6 IMPLANT
KIT THROMB PERC PTD (MISCELLANEOUS) ×3 IMPLANT
NEEDLE ENTRY 21GA 7CM ECHOTIP (NEEDLE) ×3 IMPLANT
PACK ANGIOGRAPHY (CUSTOM PROCEDURE TRAY) ×3 IMPLANT
SET INTRO CAPELLA COAXIAL (SET/KITS/TRAYS/PACK) ×3 IMPLANT
SHEATH BRITE TIP 6FRX5.5 (SHEATH) ×6 IMPLANT
SHEATH BRITE TIP 7FRX5.5 (SHEATH) ×3 IMPLANT
STENT COVERA FLARED 8X60X80 (Permanent Stent) ×3 IMPLANT
SUT MNCRL AB 4-0 PS2 18 (SUTURE) ×3 IMPLANT
TOWEL OR 17X26 4PK STRL BLUE (TOWEL DISPOSABLE) ×3 IMPLANT
WIRE J 3MM .035X145CM (WIRE) ×3 IMPLANT
WIRE MAGIC TORQUE 260C (WIRE) ×3 IMPLANT

## 2020-01-16 NOTE — Progress Notes (Signed)
Removed four sutures from distal incision Removed four sutures from proximal incision Bandage placed  Patient tolerated without complication  Hezzie Bump PA-C Vascular Surgery

## 2020-01-16 NOTE — Progress Notes (Signed)
Pt. Speaking with brother Beckie Salts in [place] for approx. 10 min. Pt. A&Ox3 at present, pleasant, conversive. No acute distress noted. Pt. Ate entire sandwich, baked Lays chips and drank 240 ml H2O.

## 2020-01-16 NOTE — Progress Notes (Addendum)
K.. Stegmayer, PA-C in at bedside to remove sutures from Pt. Forearm and left upper "underarm" area. Pt. Tolerated well. Harlene Salts, transport from [facility] phoned to pick up pt. In approx. An hour. Pt. Without any c/o upon return from procedure.

## 2020-01-16 NOTE — Op Note (Signed)
OPERATIVE NOTE   PROCEDURE: 1. Contrast Injection Left Brachial Axillary Graft 2. Mechanical Thrombectomy Left Arm AV graft 3. Percutaneous Angioplasty and stent Placement Venous Outflow  PRE-OPERATIVE DIAGNOSIS: Complication of dialysis access                                                       End Stage Renal Disease  POST-OPERATIVE DIAGNOSIS: same as above   SURGEON: Katha Cabal, M.D.  ANESTHESIA: Conscious sedation was administered by the radiology RN under my direct supervision. IV Versed plus fentanyl were utilized. Continuous ECG, pulse oximetry and blood pressure was monitored throughout the entire procedure. Conscious sedation was for a total of 45 minutes.    ESTIMATED BLOOD LOSS: minimal  FINDING(S): 1. >90% stenosis at the venous anastomosis  SPECIMEN(S):  None  CONTRAST: 35 cc  FLUOROSCOPY TIME: 4.5 minutes  INDICATIONS: Misty Berry is a 85 y.o. female who  presents with malfunctioning left arm AV access.  The patient is scheduled for angiography with possible intervention of the AV access.  The patient is aware the risks include but are not limited to: bleeding, infection, thrombosis of the cannulated access, and possible anaphylactic reaction to the contrast.  The patient acknowledges if the access can not be salvaged a tunneled catheter will be needed and will be placed during this procedure.  The patient is aware of the risks of the procedure and elects to proceed with the angiogram and intervention.  DESCRIPTION: After full informed written consent was obtained, the patient was brought back to the Special Procedure suite and placed supine position.  Appropriate cardiopulmonary monitors were placed.  The left arm was prepped and draped in the standard fashion.  Appropriate timeout is called. The left brachial axillary AV graft was cannulated with a micropuncture needle using ultrasound guidance.  With the ultrasound the AV access appeared to be filled  with heterogeneous material and was poorly compressible indicating thrombosis of the AV access. The puncture was made under direct ultrasound visualization and an image was recorded for the permanent record.  The microwire was advanced and the needle was exchanged for  a microsheath.  The J-wire was then advanced and a 6 Fr sheath inserted.  Hand was then performed which demonstrated thrombus within the AV access.  The central venous structures were also imaged by hand injections.  3000 units of heparin was given and allowed to circulate as well.  A Trerotola device was then advanced beginning centrally and pulling back performing.  Several passes were made through the venous portion of the graft. Follow-up imaging now demonstrates the vast majority of the clot had been treated. Therefore a retrograde sheath was inserted. This was a 6 [place] sheath and was positioned more proximally on the arm and angled in the retrograde direction. The left arm brachial axillary AV graft was cannulated with a micropuncture needle using ultrasound guidance in the retrograde direction.  With the ultrasound the AV access appeared to be filled with heterogeneous material and was poorly compressible indicating thrombosis of the AV access. The puncture was made under direct ultrasound visualization and an image was recorded for the permanent record.  Subsequently a floppy Glidewire and a KMP catheter were negotiated into the arterial system hand injection contrast was then utilized to demonstrate patency of the artery as well as  the location for the anastomosis. The Trerotola device was now advanced through the retrograde sheath was extended out into the artery the basket was opened and it was slowly pulled back into the graft and then the basket was engaged. Several passes were made on the arterial portion and pulsatility of the access was reestablished. Follow-up imaging demonstrates there was now thrombus in the venous portion  surrounding the sheath and this was treated with the Trerotola device from the antegrade direction. After several passes imaging demonstrated resolution of thrombus within the graft and forward flow however stricture of the graft was noted area  A 8 mm x 60 mm flared Covera was then advanced across the venous anastomosis and deployed without difficulty.  A 8 mm x 60 mm Ultraverse balloon was then used to fully expand the stent inflation was to 10 atm for 30 seconds.  Follow-up imaging now demonstrated wide patency of the venous anastomosis with less than 10% residual stenosis.  The Kumpe catheter and floppy Glidewire were then negotiated through the retrograde sheath into the brachial artery where final imaging was obtained demonstrating complete resolution of all thrombus and excellent treatment of the venous outflow.  Central venous anatomy is widely patent.  At this point I elected to terminate the case having successfully reestablish flow through the AV graft.  A 4-0 Monocryl purse-string suture was sewn around both of the sheaths.  The sheaths were removed and light pressure was applied.  A sterile bandage was applied to the puncture site.    COMPLICATIONS: None  CONDITION: Improved  Katha Cabal, M.D [facility] Office: [phone]  01/16/2020 3:53 PM

## 2020-01-16 NOTE — H&P (Signed)
[facility] Admission History & Physical  MRN : [id number]  Misty Berry is a 85 y.o. (15-May-1935) female who presents with chief complaint of scheduled dialysis access declot.  History of Present Illness:  I am asked to evaluate the patient by the dialysis center. The patient was sent here because they were unable to cannulate the her left upper extremity dialysis access. Furthermore the Center states there is no thrill or bruit. The patient states this is the first dialysis run to be missed. This problem is acute in onset and has been present for approximately two days. The patient is unaware of any other change. Patient denies pain or tenderness overlying the access.  There is no pain with dialysis.  The patient denies hand pain or finger pain consistent with steal syndrome.   Current Facility-Administered Medications  Medication Dose Route Frequency Provider Last Rate Last Admin  . 0.9 %  sodium chloride infusion   Intravenous Continuous Eulogio Ditch E, NP      . ceFAZolin (ANCEF) 1-4 GM/50ML-% IVPB           . ceFAZolin (ANCEF) IVPB 1 g/50 mL premix  1 g Intravenous Once Kris Hartmann, NP      . diphenhydrAMINE (BENADRYL) injection 50 mg  50 mg Intravenous Once PRN Kris Hartmann, NP      . famotidine (PEPCID) tablet 40 mg  40 mg Oral Once PRN Kris Hartmann, NP      . HYDROmorphone (DILAUDID) injection 1 mg  1 mg Intravenous Once PRN Eulogio Ditch E, NP      . methylPREDNISolone sodium succinate (SOLU-MEDROL) 125 mg/2 mL injection 125 mg  125 mg Intravenous Once PRN Eulogio Ditch E, NP      . midazolam (VERSED) 2 MG/ML syrup 8 mg  8 mg Oral Once PRN Kris Hartmann, NP      . ondansetron (ZOFRAN) injection 4 mg  4 mg Intravenous Q6H PRN Kris Hartmann, NP       Past Medical History:  Diagnosis Date  . Asthma   . Bruises easily   . Cellulitis   . CHF (congestive heart failure) ([place])   . Cough   . DDD (degenerative disc disease), lumbar   . GERD  (gastroesophageal reflux disease)   . HBP (high blood pressure)   . Hypercholesterolemia   . Hyperparathyroidism ([place])   . Osteoarthritis   . Osteoporosis   . Rash   . Renal disorder    kidney disease  . Swelling    Past Surgical History:  Procedure Laterality Date  . APPENDECTOMY    . AV FISTULA PLACEMENT Left 12/12/2019   Procedure: INSERTION OF ARTERIOVENOUS (AV) GORE-TEX GRAFT ARM ( BRACHIAL AXILLARY );  Surgeon: Katha Cabal, MD;  Location: [facility] ORS;  Service: Vascular;  Laterality: Left;  . BREAST REDUCTION SURGERY    . CHOLECYSTECTOMY    . DIALYSIS/PERMA CATHETER INSERTION N/A 10/02/2019   Procedure: DIALYSIS/PERMA CATHETER INSERTION;  Surgeon: Katha Cabal, MD;  Location: [facility];  Service: Cardiovascular;  Laterality: N/A;  . EYE SURGERY Left   . FEMUR SURGERY    . FOOT SURGERY Right   . GALLBLADDER SURGERY    . LIPECTOMY    . PAROTID GLAND TUMOR EXCISION    . THYROID SURGERY    . TONSILLECTOMY    . TUBAL LIGATION    . VITRECTOMY     Social History Social History   Tobacco Use  .  Smoking status: Never Smoker  . Smokeless tobacco: Never Used  Vaping Use  . Vaping Use: Never used  Substance Use Topics  . Alcohol use: No  . Drug use: No   Family History History reviewed. No pertinent family history.   No family history of bleeding or clotting disorders, autoimmune disease or porphyria  Allergies  Allergen Reactions  . Ivp Dye [Iodinated Diagnostic Agents] Other (See Comments)  . Lactose   . Methylprednisolone Other (See Comments)    Reaction: Hallucinations and Psychosis  . Peanuts [Peanut Oil] Other (See Comments)    Swelling and vomitting  . Vancomycin Other (See Comments)    Reaction: unknown  . Wellbutrin [Bupropion] Other (See Comments)    Hallucinations   REVIEW OF SYSTEMS (Negative unless checked)  Constitutional: [] Weight loss  [] Fever  [] Chills Cardiac: [] Chest pain   [] Chest pressure   [] Palpitations   [] Shortness  of breath when laying flat   [] Shortness of breath at rest   [x] Shortness of breath with exertion. Vascular:  [] Pain in legs with walking   [] Pain in legs at rest   [] Pain in legs when laying flat   [] Claudication   [] Pain in feet when walking  [] Pain in feet at rest  [] Pain in feet when laying flat   [] History of DVT   [] Phlebitis   [] Swelling in legs   [] Varicose veins   [] Non-healing ulcers Pulmonary:   [] Uses home oxygen   [] Productive cough   [] Hemoptysis   [] Wheeze  [] COPD   [] Asthma Neurologic:  [] Dizziness  [] Blackouts   [] Seizures   [] History of stroke   [] History of TIA  [] Aphasia   [] Temporary blindness   [] Dysphagia   [] Weakness or numbness in arms   [] Weakness or numbness in legs Musculoskeletal:  [x] Arthritis   [] Joint swelling   [] Joint pain   [] Low back pain Hematologic:  [] Easy bruising  [] Easy bleeding   [] Hypercoagulable state   [] Anemic  [] Hepatitis Gastrointestinal:  [] Blood in stool   [] Vomiting blood  [] Gastroesophageal reflux/heartburn   [] Difficulty swallowing. Genitourinary:  [x] Chronic kidney disease   [] Difficult urination  [] Frequent urination  [] Burning with urination   [] Blood in urine Skin:  [] Rashes   [] Ulcers   [] Wounds Psychological:  [] History of anxiety   []  History of major depression.  Physical Examination  Vitals:   01/16/20 1143  Pulse: 70  Resp: 20  Temp: 97.7 F (36.5 C)  TempSrc: Oral  SpO2: 96%  Weight: 86.2 kg  Height: 5\' 2"  (1.575 m)   Body mass index is 34.75 kg/m. Gen: WD/WN, NAD Head: [place]/AT, No temporalis wasting. Prominent temp pulse not noted. Ear/Nose/Throat: Hearing grossly intact, nares w/o erythema or drainage, oropharynx w/o Erythema/Exudate,  Eyes: Conjunctiva clear, sclera non-icteric Neck: Trachea midline.  No JVD.  Pulmonary:  Good air movement, respirations not labored, no use of accessory muscles.  Cardiac: RRR, normal S1, S2. Vascular:  Vessel Right Left  Radial Palpable Palpable  Ulnar Not Palpable Not Palpable   Brachial Palpable Palpable  Carotid Palpable, without bruit Palpable, without bruit   Left Lower Extremity: No bruit or thrill noted extremity dialysis.  There is no acute vascular extremity at this time.  Good capillary refill.  Gastrointestinal: soft, non-tender/non-distended. No guarding/reflex.  Musculoskeletal: M/S 5/5 throughout.  Extremities without ischemic changes.  No deformity or atrophy.  Neurologic: Sensation grossly intact in extremities.  Symmetrical.  Speech is fluent. Motor exam as listed above. Psychiatric: Judgment intact, Mood & affect appropriate for pt's clinical situation. Dermatologic: No  rashes or ulcers noted.  No cellulitis or open wounds. Lymph : No Cervical, Axillary, or Inguinal lymphadenopathy.  CBC Lab Results  Component Value Date   WBC 7.5 12/10/2019   HGB 13.6 12/12/2019   HCT 40.0 12/12/2019   MCV 86.6 12/10/2019   PLT 236 12/10/2019   BMET    Component Value Date/Time   NA 142 12/12/2019 1135   NA 141 04/23/2013 0534   K 4.2 12/12/2019 1135   K 3.9 04/23/2013 0534   CL 101 12/12/2019 1135   CL 112 (H) 04/23/2013 0534   CO2 27 12/10/2019 1218   CO2 23 04/23/2013 0534   GLUCOSE 75 12/12/2019 1135   GLUCOSE 92 04/23/2013 0534   BUN 24 (H) 12/12/2019 1135   BUN 31 (H) 04/23/2013 0534   CREATININE 3.40 (H) 12/12/2019 1135   CREATININE 1.78 (H) 04/23/2013 0534   CALCIUM 9.0 12/10/2019 1218   CALCIUM 8.6 04/23/2013 0534   GFRNONAA 9 (L) 12/10/2019 1218   GFRNONAA 27 (L) 04/23/2013 0534   GFRAA 10 (L) 12/10/2019 1218   GFRAA 32 (L) 04/23/2013 0534   CrCl cannot be calculated (Patient's most recent lab result is older than the maximum 21 days allowed.).  COAG Lab Results  Component Value Date   INR 1.0 12/10/2019   INR 1.0 02/04/2013   Radiology No results found.  Assessment/Plan 1.  Complication dialysis device with thrombosis AV access:  Patient's left dialysis access is thrombosed. The patient will undergo thrombectomy using  interventional techniques.  The risks and benefits were described to the patient.  All questions were answered.  The patient agrees to proceed with angiography and intervention. Potassium will be drawn to ensure that it is an appropriate level prior to performing thrombectomy. 2.  End-stage renal disease requiring hemodialysis:  Patient will continue dialysis therapy without further interruption if a successful thrombectomy is not achieved then catheter will be placed. Dialysis has already been arranged since the patient missed their previous session 3.  Hypertension:  Patient will continue medical management; nephrology is following no changes in oral medications.  Discussed with Dr. Francene Castle, PA-C  01/16/2020 12:14 PM

## 2020-01-18 ENCOUNTER — Encounter: Payer: Self-pay | Admitting: Emergency Medicine

## 2020-01-18 ENCOUNTER — Other Ambulatory Visit: Payer: Self-pay

## 2020-01-18 ENCOUNTER — Emergency Department: Payer: Medicare Other

## 2020-01-18 ENCOUNTER — Emergency Department
Admission: EM | Admit: 2020-01-18 | Discharge: 2020-01-19 | Disposition: A | Discharge status: Home / Self Care | Payer: Medicare Other | Attending: Emergency Medicine | Admitting: Emergency Medicine

## 2020-01-18 DIAGNOSIS — Z79899 Other long term (current) drug therapy: Secondary | ICD-10-CM | POA: Insufficient documentation

## 2020-01-18 DIAGNOSIS — J45909 Unspecified asthma, uncomplicated: Secondary | ICD-10-CM | POA: Diagnosis not present

## 2020-01-18 DIAGNOSIS — N390 Urinary tract infection, site not specified: Secondary | ICD-10-CM

## 2020-01-18 DIAGNOSIS — N185 Chronic kidney disease, stage 5: Secondary | ICD-10-CM | POA: Insufficient documentation

## 2020-01-18 DIAGNOSIS — I132 Hypertensive heart and chronic kidney disease with heart failure and with stage 5 chronic kidney disease, or end stage renal disease: Secondary | ICD-10-CM | POA: Diagnosis not present

## 2020-01-18 DIAGNOSIS — I959 Hypotension, unspecified: Secondary | ICD-10-CM | POA: Diagnosis present

## 2020-01-18 DIAGNOSIS — Z20822 Contact with and (suspected) exposure to covid-19: Secondary | ICD-10-CM | POA: Insufficient documentation

## 2020-01-18 DIAGNOSIS — Z992 Dependence on renal dialysis: Secondary | ICD-10-CM | POA: Diagnosis not present

## 2020-01-18 DIAGNOSIS — Z91010 Allergy to peanuts: Secondary | ICD-10-CM | POA: Insufficient documentation

## 2020-01-18 DIAGNOSIS — Z7982 Long term (current) use of aspirin: Secondary | ICD-10-CM | POA: Diagnosis not present

## 2020-01-18 DIAGNOSIS — I509 Heart failure, unspecified: Secondary | ICD-10-CM | POA: Insufficient documentation

## 2020-01-18 LAB — CBC WITH DIFFERENTIAL/PLATELET
Abs Immature Granulocytes: 0.02 10*3/uL (ref 0.00–0.07)
Basophils Absolute: 0 10*3/uL (ref 0.0–0.1)
Basophils Relative: 1 %
Eosinophils Absolute: 0.2 10*3/uL (ref 0.0–0.5)
Eosinophils Relative: 3 %
HCT: 34.6 % — ABNORMAL LOW (ref 36.0–46.0)
Hemoglobin: 10.6 g/dL — ABNORMAL LOW (ref 12.0–15.0)
Immature Granulocytes: 0 %
Lymphocytes Relative: 20 %
Lymphs Abs: 1.5 10*3/uL (ref 0.7–4.0)
MCH: 27.8 pg (ref 26.0–34.0)
MCHC: 30.6 g/dL (ref 30.0–36.0)
MCV: 90.8 fL (ref 80.0–100.0)
Monocytes Absolute: 0.4 10*3/uL (ref 0.1–1.0)
Monocytes Relative: 6 %
Neutro Abs: 5.4 10*3/uL (ref 1.7–7.7)
Neutrophils Relative %: 70 %
Platelets: 200 10*3/uL (ref 150–400)
RBC: 3.81 MIL/uL — ABNORMAL LOW (ref 3.87–5.11)
RDW: 23.5 % — ABNORMAL HIGH (ref 11.5–15.5)
Smear Review: NORMAL
WBC: 7.6 10*3/uL (ref 4.0–10.5)
nRBC: 0 % (ref 0.0–0.2)

## 2020-01-18 LAB — COMPREHENSIVE METABOLIC PANEL
ALT: 12 U/L (ref 0–44)
AST: 24 U/L (ref 15–41)
Albumin: 3.3 g/dL — ABNORMAL LOW (ref 3.5–5.0)
Alkaline Phosphatase: 91 U/L (ref 38–126)
Anion gap: 12 (ref 5–15)
BUN: 11 mg/dL (ref 8–23)
CO2: 27 mmol/L (ref 22–32)
Calcium: 8.4 mg/dL — ABNORMAL LOW (ref 8.9–10.3)
Chloride: 98 mmol/L (ref 98–111)
Creatinine, Ser: 2.08 mg/dL — ABNORMAL HIGH (ref 0.44–1.00)
GFR calc Af Amer: 25 mL/min — ABNORMAL LOW (ref >60)
GFR calc non Af Amer: 22 mL/min — ABNORMAL LOW (ref >60)
Glucose, Bld: 93 mg/dL (ref 70–99)
Potassium: 4.2 mmol/L (ref 3.5–5.1)
Sodium: 137 mmol/L (ref 135–145)
Total Bilirubin: 0.7 mg/dL (ref 0.3–1.2)
Total Protein: 7.7 g/dL (ref 6.5–8.1)

## 2020-01-18 LAB — PROTIME-INR
INR: 0.9 (ref 0.8–1.2)
Prothrombin Time: 12.2 seconds (ref 11.4–15.2)

## 2020-01-18 LAB — LACTIC ACID, PLASMA: Lactic Acid, Venous: 2.3 mmol/L (ref 0.5–1.9)

## 2020-01-18 LAB — TROPONIN I (HIGH SENSITIVITY)
Troponin I (High Sensitivity): 11 ng/L (ref <18)
Troponin I (High Sensitivity): 9 ng/L (ref <18)

## 2020-01-18 MED ORDER — SODIUM CHLORIDE 0.9 % IV BOLUS
500.0000 mL | Freq: Once | INTRAVENOUS | Status: AC
  Administered 2020-01-18: 500 mL via INTRAVENOUS

## 2020-01-18 NOTE — ED Provider Notes (Addendum)
[facility] Emergency Department Provider Note   ____________________________________________   First MD Initiated Contact with Patient 01/18/20 2240     (approximate)  I have reviewed the triage vital signs and the nursing notes.   HISTORY  Chief Complaint Hypotension    HPI Misty Berry is a 85 y.o. female patient sent from care facility for low blood pressure.  Blood pressure was in the 70s there 80s on arrival here 80/39.  Patient reports she feels fine she denies headache earache sore throat chest pain or tightness coughing shortness of breath belly pain nausea vomiting diarrhea or any other complaints.  She gets dialysis had to have her shunt redone.  She does make urine.         Past Medical History:  Diagnosis Date  . Asthma   . Bruises easily   . Cellulitis   . CHF (congestive heart failure) ([place])   . Cough   . DDD (degenerative disc disease), lumbar   . GERD (gastroesophageal reflux disease)   . HBP (high blood pressure)   . Hypercholesterolemia   . Hyperparathyroidism ([place])   . Osteoarthritis   . Osteoporosis   . Rash   . Renal disorder    kidney disease  . Swelling     Patient Active Problem List   Diagnosis Date Noted  . Swelling   . Hypertensive heart and renal disease   . Palliative care by specialist   . Advanced care planning/counseling discussion   . Acute on chronic diastolic heart failure ([place])   . CHF (congestive heart failure) ([place]) 09/28/2019  . Lymphedema 09/10/2019  . Iron deficiency anemia 08/07/2019  . Anemia in stage 4 chronic kidney disease ([place]) 08/07/2019  . Acute respiratory failure with hypoxia ([place]) 07/02/2019  . Anemia of chronic kidney failure, stage 5 ([place]) 07/02/2019  . Dyspnea   . Hypoxemia 06/27/2019  . Acute on chronic systolic (congestive) heart failure ([place]) 06/27/2019  . Acid reflux 08/27/2015  . Cellulitis 08/27/2015  . Abnormal bruising 08/27/2015  . Arthritis, degenerative  08/27/2015  . OP (osteoporosis) 08/27/2015  . Presence of artificial eye 08/27/2015  . History of surgical procedure 08/27/2015  . Hypertensive heart disease with stage 4 chronic kidney disease ([place]) 04/30/2015  . Laceration of forearm without foreign body 04/30/2015  . Venous insufficiency of leg 02/28/2015  . Gluteal tendinitis 01/22/2015  . Degenerative arthritis of lumbar spine 12/12/2014  . Back muscle spasm 11/14/2014  . Degeneration of intervertebral disc of lumbar region 11/14/2014  . Neuritis or radiculitis due to rupture of lumbar intervertebral disc 11/14/2014  . Allergic rhinitis 08/31/2014  . Clinical depression 02/19/2014  . Gait instability 02/19/2014  . Benign hypertensive heart and kidney disease 10/31/2013  . Left heart failure ([place]) 10/31/2013  . Branch retinal vein occlusion 09/11/2013  . Asthma 06/20/2013  . Essential hypertension 06/20/2013  . Follow-up examination 04/30/2013  . Acute renal failure ([place]) 02/16/2013  . Fall 02/16/2013  . Closed fracture of part of neck of femur ([place]) 02/16/2013  . Cellulitis and abscess of leg 10/30/2012  . Cellulitis of lower extremity 10/30/2012  . CKD (chronic kidney disease), stage V ([place]) 10/30/2012  . Central retinal edema, cystoid 01/31/2012  . Impaired vision 12/17/2011    Past Surgical History:  Procedure Laterality Date  . APPENDECTOMY    . AV FISTULA PLACEMENT Left 12/12/2019   Procedure: INSERTION OF ARTERIOVENOUS (AV) GORE-TEX GRAFT ARM ( BRACHIAL AXILLARY );  Surgeon: Katha Cabal, MD;  Location: [facility] ORS;  Service: Vascular;  Laterality: Left;  . BREAST REDUCTION SURGERY    . CHOLECYSTECTOMY    . DIALYSIS/PERMA CATHETER INSERTION N/A 10/02/2019   Procedure: DIALYSIS/PERMA CATHETER INSERTION;  Surgeon: Katha Cabal, MD;  Location: [place] [facility];  Service: Cardiovascular;  Laterality: N/A;  . EYE SURGERY Left   . FEMUR SURGERY    . FOOT SURGERY Right   . GALLBLADDER SURGERY    . LIPECTOMY     . PAROTID GLAND TUMOR EXCISION    . THYROID SURGERY    . TONSILLECTOMY    . TUBAL LIGATION    . VITRECTOMY      Prior to Admission medications   Medication Sig Start Date End Date Taking? Authorizing Provider  acidophilus (RISAQUAD) CAPS capsule Take 2 capsules by mouth 3 (three) times daily. Patient taking differently: Take 2 capsules by mouth 3 (three) times daily. (0800, 1400, & 2000) 10/06/19   Lorella Nimrod, MD  aspirin 81 MG chewable tablet Chew 81 mg by mouth daily. (0800)    [provider]  bumetanide (BUMEX) 1 MG tablet Take 1 tablet (1 mg total) by mouth daily. Patient taking differently: Take 1 mg by mouth daily. (0800) 10/07/19   Lorella Nimrod, MD  busPIRone (BUSPAR) 5 MG tablet Take 5 mg by mouth daily. (0800) 08/16/19   [provider]  calcium-vitamin D (OSCAL WITH D) 500-200 MG-UNIT tablet Take 1 tablet by mouth daily.    [provider]  docusate sodium (COLACE) 100 MG capsule Take 1 capsule (100 mg total) by mouth 2 (two) times daily. 07/02/19   Danford, Suann Larry, MD  Ergocalciferol (VITAMIN D2) 50 MCG (2000 UT) TABS Take 1 tablet by mouth daily.    [provider]  esomeprazole (NEXIUM) 40 MG capsule Take 40 mg by mouth daily. (0800)    [provider]  ferrous sulfate 325 (65 FE) MG tablet Take 325 mg by mouth daily with breakfast.    [provider]  hydrALAZINE (APRESOLINE) 25 MG tablet Take 1 tablet (25 mg total) by mouth every 8 (eight) hours. 10/06/19   Lorella Nimrod, MD  HYDROcodone-acetaminophen (NORCO) 5-325 MG tablet Take 1-2 tablets by mouth every 6 (six) hours as needed for moderate pain or severe pain. 12/12/19   Schnier, Dolores Lory, MD  losartan (COZAAR) 50 MG tablet Take 1 tablet (50 mg total) by mouth daily. Patient taking differently: Take 50 mg by mouth daily. (0800) on Mon, Wed, Fri, and Sun 10/07/19   Lorella Nimrod, MD  metoprolol tartrate (LOPRESSOR) 25 MG tablet Take 25 mg by mouth daily. On Mon,  Wed, Fri, and Sun    [provider]  Multiple Vitamins-Minerals (CENTRUM SILVER PO) Take 1 tablet by mouth daily.    [provider]  Omega-3 Fatty Acids (FISH OIL) 1000 MG CAPS Take 4 capsules by mouth daily.    [provider]  sertraline (ZOLOFT) 100 MG tablet Take 100 mg by mouth every morning. 0800    [provider]  sevelamer carbonate (RENVELA) 800 MG tablet Take 1,600 mg by mouth 3 (three) times daily with meals.    [provider]  Skin Protectants, Misc. (BAZA PROTECT EX) Apply 1 application topically every 4 (four) hours. (2000)     [provider]  sulfamethoxazole-trimethoprim (BACTRIM DS) 800-160 MG tablet Take 1 tablet by mouth daily. 12/10/19   [provider]  sulfamethoxazole-trimethoprim (BACTRIM) 400-80 MG tablet Take 1 tablet by mouth daily. Patient not  taking: Reported on 12/12/2019 12/11/19   [provider]  traMADol (ULTRAM) 50 MG tablet Take 1 tablet (50 mg total) by mouth every 6 (six) hours as needed for moderate pain. 07/02/19   Danford, Suann Larry, MD    Allergies Ivp dye [iodinated diagnostic agents], Lactose, Methylprednisolone, Peanuts [peanut oil], Vancomycin, and Wellbutrin [bupropion]  No family history on file.  Social History Social History   Tobacco Use  . Smoking status: Never Smoker  . Smokeless tobacco: Never Used  Vaping Use  . Vaping Use: Never used  Substance Use Topics  . Alcohol use: No  . Drug use: No    Review of Systems  Constitutional: No fever/chills Eyes: No visual changes. ENT: No sore throat. Cardiovascular: Denies chest pain. Respiratory: Denies shortness of breath. Gastrointestinal: No abdominal pain.  No nausea, no vomiting.  No diarrhea.  No constipation. Genitourinary: Negative for dysuria. Musculoskeletal: Negative for back pain. Skin: Negative for rash. Neurological: Negative for headaches, focal weakness    ____________________________________________   PHYSICAL EXAM:  VITAL SIGNS: ED Triage Vitals  Enc Vitals Group     BP 01/18/20 2154 (!) 80/39     Pulse Rate 01/18/20 2154 81     Resp 01/18/20 2154 20     Temp 01/18/20 2154 98 F (36.7 C)     Temp Source 01/18/20 2154 Oral     SpO2 01/18/20 2154 95 %     Weight 01/18/20 2155 190 lb (86.2 kg)     Height 01/18/20 2155 5\' 2"  (1.575 m)     Head Circumference --      Peak Flow --      Pain Score 01/18/20 2155 0     Pain Loc --      Pain Edu? --      Excl. in [place]? --     Constitutional: Alert and oriented. Well appearing and in no acute distress. Eyes: Conjunctivae are normal. PER.  Head: Atraumatic. Nose: No congestion/rhinnorhea. Mouth/Throat: Mucous membranes are moist.  Oropharynx non-erythematous. Neck: No stridor.  Cardiovascular: Normal rate, regular rhythm. Grossly normal heart sounds.  Good peripheral circulation. Respiratory: Normal respiratory effort.  No retractions. Lungs CTAB. Gastrointestinal: Soft and nontender. No distention. No abdominal bruits. No CVA tenderness. Musculoskeletal: No lower extremity tenderness bilateral 1+ edema.  Neurologic:  Normal speech and language. No gross focal neurologic deficits are appreciated.  Skin:  Skin is warm, dry and intact except for small amounts of skin breakdown without a lot of redness on the legs bilaterally. No rash noted. Psychiatric: Mood and affect are normal. Speech and behavior are normal.  ____________________________________________   LABS (all labs ordered are listed, but only abnormal results are displayed)  Labs Reviewed  COMPREHENSIVE METABOLIC PANEL - Abnormal; Notable for the following components:      Result Value   Creatinine, Ser 2.08 (*)    Calcium 8.4 (*)    Albumin 3.3 (*)    GFR calc non Af Amer 22 (*)    GFR calc Af Amer 25 (*)    All other components within normal limits  LACTIC ACID, PLASMA - Abnormal; Notable for the following  components:   Lactic Acid, Venous 2.3 (*)    All other components within normal limits  CBC WITH DIFFERENTIAL/PLATELET - Abnormal; Notable for the following components:   RBC 3.81 (*)    Hemoglobin 10.6 (*)    HCT 34.6 (*)    RDW 23.5 (*)    All other components within normal limits  CULTURE, BLOOD (ROUTINE X 2)  CULTURE, BLOOD (ROUTINE X 2)  PROTIME-INR  URINALYSIS, COMPLETE (UACMP) WITH MICROSCOPIC  TROPONIN I (HIGH SENSITIVITY)  TROPONIN I (HIGH SENSITIVITY)   ____________________________________________  EKG  EKG read interpreted by me shows normal sinus rhythm rate of 70 normal axis no acute ST-T wave changes ____________________________________________  RADIOLOGY  ED MD interpretation: Rest x-ray read by radiology reviewed by me does not show any acute changes  Official radiology report(s): DG Chest 2 View  Result Date: 01/18/2020 CLINICAL DATA:  Low blood pressure. EXAM: CHEST - 2 VIEW COMPARISON:  September 28, 2019 FINDINGS: A right-sided venous catheter is seen with its distal tip noted just beyond the junction of the superior vena cava and right atrium. Mild diffuse chronic appearing increased lung markings are present. There is no evidence of acute infiltrate, pleural effusion or pneumothorax. The heart size and mediastinal contours are within normal limits. There is mild calcification of the abdominal aorta. A large hiatal hernia is seen. A chronic sixth right rib deformity is noted. Multilevel degenerative changes seen throughout the thoracic spine. IMPRESSION: 1. No acute or active cardiopulmonary disease. 2. Large hiatal hernia. Electronically Signed   By: Virgina Norfolk M.D.   On: 01/18/2020 22:37    ____________________________________________   PROCEDURES  Procedure(s) performed (including Critical Care):  Procedures   ____________________________________________   INITIAL IMPRESSION / ASSESSMENT AND PLAN / ED COURSE   Pt signed out to Dr Beather Arbour              ____________________________________________   FINAL CLINICAL IMPRESSION(S) / ED DIAGNOSES  Final diagnoses:  Hypotension, unspecified hypotension type     ED Discharge Orders    None       Note:  This document was prepared using Dragon voice recognition software and may include unintentional dictation errors.    Nena Polio, MD 01/18/20 2303    Nena Polio, MD 01/18/20 [phone]

## 2020-01-18 NOTE — ED Notes (Signed)
First nurse note- from [facility].  Called EMS because low bp. Unsure of baseline.  94/48.  Pt oriented per EMS, alert and oriented.

## 2020-01-18 NOTE — ED Notes (Signed)
Pt states coming in because she was told she was having low blood pressure. Pt states she missed dialysis and has a new dialysis port to the left arm, placed about a week ago that aches at times.

## 2020-01-18 NOTE — ED Notes (Signed)
Critical lactic of 2.3 called from lab. Dr. Cinda Quest notified, no new verbal orders received.

## 2020-01-18 NOTE — ED Triage Notes (Signed)
Pt presents to ER from [facility], per staff pt's BP running low as low as 79/44 at facility, pt has CHF, ESKD. Pt reports she feels fine, denies any pain. Pt's BP is low in triage 80/39. Pt denies any other symptom reports she feels fine. Pt talks in complete sentences. No respiratory distress noted

## 2020-01-19 DIAGNOSIS — I959 Hypotension, unspecified: Secondary | ICD-10-CM | POA: Diagnosis not present

## 2020-01-19 LAB — URINALYSIS, COMPLETE (UACMP) WITH MICROSCOPIC
Bacteria, UA: NONE SEEN
Bilirubin Urine: NEGATIVE
Glucose, UA: NEGATIVE mg/dL
Hgb urine dipstick: NEGATIVE
Ketones, ur: NEGATIVE mg/dL
Nitrite: NEGATIVE
Protein, ur: 100 mg/dL — AB
Specific Gravity, Urine: 1.008 (ref 1.005–1.030)
pH: 7 (ref 5.0–8.0)

## 2020-01-19 LAB — SARS CORONAVIRUS 2 BY RT PCR (HOSPITAL ORDER, PERFORMED IN ~~LOC~~ HOSPITAL LAB): SARS Coronavirus 2: NEGATIVE

## 2020-01-19 LAB — TROPONIN I (HIGH SENSITIVITY): Troponin I (High Sensitivity): 11 ng/L (ref <18)

## 2020-01-19 LAB — LACTIC ACID, PLASMA: Lactic Acid, Venous: 1.5 mmol/L (ref 0.5–1.9)

## 2020-01-19 MED ORDER — SODIUM CHLORIDE 0.9 % IV SOLN
1.0000 g | Freq: Once | INTRAVENOUS | Status: AC
  Administered 2020-01-19: 1 g via INTRAVENOUS
  Filled 2020-01-19: qty 10

## 2020-01-19 MED ORDER — CEPHALEXIN 250 MG PO CAPS
250.0000 mg | ORAL_CAPSULE | Freq: Three times a day (TID) | ORAL | 0 refills | Status: DC
Start: 2020-01-19 — End: 2020-02-05

## 2020-01-19 NOTE — Discharge Instructions (Addendum)
1.  Take antibiotic as prescribed (Keflex 250 mg 3 times daily x7 days). 2.  Return to the ER for worsening symptoms, persistent vomiting, difficulty breathing or other concerns. 

## 2020-01-19 NOTE — ED Notes (Signed)
Provider states that the 0041 troponin is not needed

## 2020-01-19 NOTE — ED Notes (Signed)
Provider aware of most recent BP

## 2020-01-19 NOTE — ED Notes (Signed)
Report given to [facility] Nurse, Colletta [place]

## 2020-01-19 NOTE — ED Provider Notes (Signed)
-----------------------------------------   12:20 AM on 01/19/2020 -----------------------------------------  Care assumed from Dr. Rip Harbour.  BP 114/52.  Patient states "I feel just fine". States she has felt just fine all evening, including when her blood pressure was low.  She does make urine but she agrees to INO cath.  Plan to check UA, repeat lactic acid after administration of IV fluids.   ----------------------------------------- 2:03 AM on 01/19/2020 -----------------------------------------  Repeat lactate normalized. BP holding stable, 124/51. Patient voiced no complaints. Will discharge home on Keflex. Blood and urine cultures pending. Strict return precautions given. Patient verbalizes understanding and agrees with plan of care.   Paulette Blanch, MD 01/19/20 [phone]

## 2020-01-19 NOTE — ED Notes (Signed)
In and out catheter completed by Jinny Blossom, RN

## 2020-01-19 NOTE — ED Notes (Signed)
Report given to EMS

## 2020-01-20 LAB — URINE CULTURE: Culture: NO GROWTH

## 2020-01-21 ENCOUNTER — Encounter: Payer: Self-pay | Admitting: Vascular Surgery

## 2020-01-23 LAB — CULTURE, BLOOD (ROUTINE X 2)
Culture: NO GROWTH
Culture: NO GROWTH

## 2020-01-24 ENCOUNTER — Encounter (INDEPENDENT_AMBULATORY_CARE_PROVIDER_SITE_OTHER): Payer: Medicare Other

## 2020-01-24 ENCOUNTER — Ambulatory Visit (INDEPENDENT_AMBULATORY_CARE_PROVIDER_SITE_OTHER): Payer: Medicare Other | Admitting: Nurse Practitioner

## 2020-02-04 ENCOUNTER — Ambulatory Visit: Payer: Medicare Other | Admitting: Family

## 2020-02-05 ENCOUNTER — Other Ambulatory Visit: Payer: Self-pay

## 2020-02-05 ENCOUNTER — Encounter: Payer: Self-pay | Admitting: Family

## 2020-02-05 ENCOUNTER — Ambulatory Visit: Payer: Medicare Other | Attending: Family | Admitting: Family

## 2020-02-05 VITALS — BP 98/67 | HR 84 | Resp 18 | Ht 62.0 in | Wt 194.1 lb

## 2020-02-05 DIAGNOSIS — N186 End stage renal disease: Secondary | ICD-10-CM | POA: Insufficient documentation

## 2020-02-05 DIAGNOSIS — Z7982 Long term (current) use of aspirin: Secondary | ICD-10-CM | POA: Diagnosis not present

## 2020-02-05 DIAGNOSIS — I132 Hypertensive heart and chronic kidney disease with heart failure and with stage 5 chronic kidney disease, or end stage renal disease: Secondary | ICD-10-CM | POA: Insufficient documentation

## 2020-02-05 DIAGNOSIS — I5022 Chronic systolic (congestive) heart failure: Secondary | ICD-10-CM | POA: Diagnosis present

## 2020-02-05 DIAGNOSIS — J45909 Unspecified asthma, uncomplicated: Secondary | ICD-10-CM | POA: Insufficient documentation

## 2020-02-05 DIAGNOSIS — Z9049 Acquired absence of other specified parts of digestive tract: Secondary | ICD-10-CM | POA: Diagnosis not present

## 2020-02-05 DIAGNOSIS — Z881 Allergy status to other antibiotic agents status: Secondary | ICD-10-CM | POA: Diagnosis not present

## 2020-02-05 DIAGNOSIS — Z992 Dependence on renal dialysis: Secondary | ICD-10-CM | POA: Insufficient documentation

## 2020-02-05 DIAGNOSIS — E213 Hyperparathyroidism, unspecified: Secondary | ICD-10-CM | POA: Diagnosis not present

## 2020-02-05 DIAGNOSIS — E785 Hyperlipidemia, unspecified: Secondary | ICD-10-CM | POA: Insufficient documentation

## 2020-02-05 DIAGNOSIS — E7800 Pure hypercholesterolemia, unspecified: Secondary | ICD-10-CM | POA: Insufficient documentation

## 2020-02-05 DIAGNOSIS — K219 Gastro-esophageal reflux disease without esophagitis: Secondary | ICD-10-CM | POA: Diagnosis not present

## 2020-02-05 DIAGNOSIS — M1990 Unspecified osteoarthritis, unspecified site: Secondary | ICD-10-CM | POA: Insufficient documentation

## 2020-02-05 DIAGNOSIS — Z79899 Other long term (current) drug therapy: Secondary | ICD-10-CM | POA: Diagnosis not present

## 2020-02-05 NOTE — Patient Instructions (Addendum)
Continue weighing daily and call for an overnight weight gain of > 2 pounds or a weekly weight gain of >5 pounds.   Call us in the future if you'd like to schedule another appointment 

## 2020-02-05 NOTE — Progress Notes (Signed)
Patient ID: Misty Berry, female    DOB: 1934/12/03, 85 y.o.   MRN: [id number]  HPI  Misty Berry is a 85 y/o female with a history of asthma, hyperlipidemia, HTN, CKD, GERD, hyperparathyroidism, DDD and chronic heart failure.   Echo report from 06/27/19 reviewed and showed an EF of 35-40%.  In the ED 01/18/20 due to hypotension. IVF fluids given. Given antibiotic with discharge home. Was in the ED 12/29/19 due to vascular access issue. Evaluated and released.   She presents today for a follow-up visit with a chief complaint of pedal edema. She describes this as being present for several years but isn't entirely clear on timeline. She has associated bruising along with this. She denies any difficulty sleeping, dizziness, abdominal distention, palpitations, chest pain, shortness of breath, cough or fatigue.   Past Medical History:  Diagnosis Date   Asthma    Bruises easily    Cellulitis    CHF (congestive heart failure) (HCC)    Cough    DDD (degenerative disc disease), lumbar    GERD (gastroesophageal reflux disease)    HBP (high blood pressure)    Hypercholesterolemia    Hyperparathyroidism (H[facility])    Osteoarthritis    Osteoporosis    Rash    Renal disorder    kidney disease   Swelling    Past Surgical History:  Procedure Laterality Date   APPENDECTOMY     AV FISTULA PLACEMENT Left 12/12/2019   Procedure: INSERTION OF ARTERIOVENOUS (AV) GORE-TEX GRAFT ARM ( BRACHIAL AXILLARY );  Surgeon: Katha Cabal, MD;  Location: [facility] ORS;  Service: Vascular;  Laterality: Left;   BREAST REDUCTION SURGERY     CHOLECYSTECTOMY     DIALYSIS/PERMA CATHETER INSERTION N/A 10/02/2019   Procedure: DIALYSIS/PERMA CATHETER INSERTION;  Surgeon: Katha Cabal, MD;  Location: [facility] [facility];  Service: Cardiovascular;  Laterality: N/A;   EYE SURGERY Left    FEMUR SURGERY     FOOT SURGERY Right    GALLBLADDER SURGERY     LIPECTOMY     PAROTID GLAND TUMOR EXCISION      PERIPHERAL VASCULAR THROMBECTOMY Left 01/16/2020   Procedure: PERIPHERAL VASCULAR THROMBECTOMY;  Surgeon: Katha Cabal, MD;  Location: [place] [facility];  Service: Cardiovascular;  Laterality: Left;   THYROID SURGERY     TONSILLECTOMY     TUBAL LIGATION     VITRECTOMY     No family history on file. Social History   Tobacco Use   Smoking status: Never Smoker   Smokeless tobacco: Never Used  Substance Use Topics   Alcohol use: No   Allergies  Allergen Reactions   Ivp Dye [Iodinated Diagnostic Agents] Other (See Comments)   Lactose    Methylprednisolone Other (See Comments)    Reaction: Hallucinations and Psychosis   Peanuts [Peanut Oil] Other (See Comments)    Swelling and vomitting   Vancomycin Other (See Comments)    Reaction: unknown   Wellbutrin [Bupropion] Other (See Comments)    Hallucinations   Prior to Admission medications   Medication Sig Start Date End Date Taking? Authorizing Provider  acidophilus (RISAQUAD) CAPS capsule Take 2 capsules by mouth 3 (three) times daily. Patient taking differently: Take 2 capsules by mouth 3 (three) times daily. (0800, 1400, & 2000) 10/06/19  Yes Lorella Nimrod, MD  aspirin 81 MG chewable tablet Chew 81 mg by mouth daily. (0800)   Yes [provider]  bumetanide (BUMEX) 1 MG tablet Take 1 tablet (1 mg  total) by mouth daily. Patient taking differently: Take 1 mg by mouth daily. (0800) 10/07/19  Yes Lorella Nimrod, MD  busPIRone (BUSPAR) 5 MG tablet Take 5 mg by mouth daily. (0800) 08/16/19  Yes [provider]  calcium-vitamin D (OSCAL WITH D) 500-200 MG-UNIT tablet Take 1 tablet by mouth daily.   Yes [provider]  docusate sodium (COLACE) 100 MG capsule Take 1 capsule (100 mg total) by mouth 2 (two) times daily. 07/02/19  Yes Danford, Suann Larry, MD  Ergocalciferol (VITAMIN D2) 50 MCG (2000 UT) TABS Take 1 tablet by mouth daily.   Yes [provider]  esomeprazole (NEXIUM) 40 MG  capsule Take 40 mg by mouth daily. (0800)   Yes [provider]  ferrous sulfate 325 (65 FE) MG tablet Take 325 mg by mouth daily with breakfast.   Yes [provider]  hydrALAZINE (APRESOLINE) 25 MG tablet Take 1 tablet (25 mg total) by mouth every 8 (eight) hours. 10/06/19  Yes Lorella Nimrod, MD  losartan (COZAAR) 50 MG tablet Take 1 tablet (50 mg total) by mouth daily. Patient taking differently: Take 50 mg by mouth daily. (0800) on Mon, Wed, Fri, and Sun 10/07/19  Yes Lorella Nimrod, MD  metoprolol tartrate (LOPRESSOR) 25 MG tablet Take 25 mg by mouth daily. On Mon, Wed, Fri, and Sun   Yes [provider]  Multiple Vitamins-Minerals (CENTRUM SILVER PO) Take 1 tablet by mouth daily.   Yes [provider]  Omega-3 Fatty Acids (FISH OIL) 1000 MG CAPS Take 4 capsules by mouth daily.   Yes [provider]  sertraline (ZOLOFT) 100 MG tablet Take 100 mg by mouth every morning. 0800   Yes [provider]  sevelamer carbonate (RENVELA) 800 MG tablet Take 1,600 mg by mouth 3 (three) times daily with meals.   Yes [provider]  Skin Protectants, Misc. (BAZA PROTECT EX) Apply 1 application topically every 4 (four) hours. (2000)    Yes [provider]  traMADol (ULTRAM) 50 MG tablet Take 1 tablet (50 mg total) by mouth every 6 (six) hours as needed for moderate pain. 07/02/19  Yes Danford, Suann Larry, MD  HYDROcodone-acetaminophen (NORCO) 5-325 MG tablet Take 1-2 tablets by mouth every 6 (six) hours as needed for moderate pain or severe pain. Patient not taking: Reported on 02/05/2020 12/12/19   Schnier, Dolores Lory, MD  sulfamethoxazole-trimethoprim (BACTRIM DS) 800-160 MG tablet Take 1 tablet by mouth daily. Patient not taking: Reported on 02/05/2020 12/10/19   [provider]    Review of Systems  Constitutional: Negative for appetite change and fatigue.  HENT: Negative for congestion, postnasal drip and sneezing.   Eyes:  Negative.   Respiratory: Negative for cough and shortness of breath.   Cardiovascular: Positive for leg swelling. Negative for chest pain and palpitations.  Gastrointestinal: Negative for abdominal distention and abdominal pain.  Endocrine: Negative.   Genitourinary: Negative.   Musculoskeletal: Negative for back pain and neck pain.  Allergic/Immunologic: Negative.   Neurological: Negative for dizziness and light-headedness.  Hematological: Negative for adenopathy. Bruises/bleeds easily.  Psychiatric/Behavioral: Negative for dysphoric mood and sleep disturbance (sleeping on 1 pillow). The patient is not nervous/anxious.     Vitals:   02/05/20 1231  BP: 98/67  Pulse: 84  Resp: 18  SpO2: 96%  Weight: 194 lb 2 oz (88.1 kg)  Height: 5\' 2"  (1.575 m)   Wt Readings from Last 3 Encounters:  02/05/20 194 lb 2 oz (88.1 kg)  01/18/20 190 lb (  86.2 kg)  01/16/20 190 lb (86.2 kg)   Lab Results  Component Value Date   CREATININE 2.08 (H) 01/18/2020   CREATININE 3.40 (H) 12/12/2019   CREATININE 4.30 (H) 12/10/2019    Physical Exam Vitals and nursing note reviewed.  Constitutional:      Appearance: Normal appearance.  HENT:     Head: Normocephalic and atraumatic.  Cardiovascular:     Rate and Rhythm: Normal rate and regular rhythm.  Pulmonary:     Effort: Pulmonary effort is normal. No respiratory distress.     Breath sounds: No wheezing or rales.  Abdominal:     General: There is no distension.     Palpations: Abdomen is soft.  Musculoskeletal:        General: No tenderness.     Cervical back: Normal range of motion and neck supple.     Right lower leg: Edema (2+ pitting) present.     Left lower leg: Edema (2+ pitting ) present.  Skin:    General: Skin is warm and dry.  Neurological:     General: No focal deficit present.     Mental Status: She is alert and oriented to person, place, and time.  Psychiatric:        Mood and Affect: Mood normal.        Behavior: Behavior  normal.        Thought Content: Thought content normal.     Assessment & Plan:  1: Chronic heart failure with reduced ejection fraction- - NYHA class II - euvolemic today - reminded to call for an overnight weight gain of >2 pounds or a weekly weight gain of >5 pounds - weight up 19 pounds since her last visit here 4 months ago - not adding salt to her food and she says that she hasn't added salt "in years"; does admit to eating alot - elevate legs when sitting for long periods of time - saw cardiology Gilford Rile) 12/12/19 - has palliative care involved - BNP 10/01/19 was 690.0 - reports receiving both covid vaccines  2: HTN- - BP on the low side although she denies any dizziness - saw PCP ([facility]ell-Tillman) 06/20/19 - BMP 01/18/20 reviewed and showed sodium 137, potassium 4.2, creatinine 2.08 and GFR 22  3: ESRD- - saw nephrology Smith Mince) 09/06/19 - has dialysis on M, W, F - saw vascular (Schnier) 12/03/19   Facility medication list was reviewed.   Due to HF stability, will not make a return appointment for patient at this time. Advised patient and caregiver that they could call back at anytime to schedule another appointment and they were agreeable to this plan.

## 2020-02-06 ENCOUNTER — Other Ambulatory Visit (INDEPENDENT_AMBULATORY_CARE_PROVIDER_SITE_OTHER): Payer: Self-pay | Admitting: Vascular Surgery

## 2020-02-06 ENCOUNTER — Encounter: Payer: Self-pay | Admitting: Family

## 2020-02-06 DIAGNOSIS — N186 End stage renal disease: Secondary | ICD-10-CM

## 2020-02-06 DIAGNOSIS — T829XXS Unspecified complication of cardiac and vascular prosthetic device, implant and graft, sequela: Secondary | ICD-10-CM

## 2020-02-06 DIAGNOSIS — Z9862 Peripheral vascular angioplasty status: Secondary | ICD-10-CM

## 2020-02-07 ENCOUNTER — Ambulatory Visit (INDEPENDENT_AMBULATORY_CARE_PROVIDER_SITE_OTHER): Payer: Medicare Other | Admitting: Nurse Practitioner

## 2020-02-07 ENCOUNTER — Encounter (INDEPENDENT_AMBULATORY_CARE_PROVIDER_SITE_OTHER): Payer: Self-pay

## 2020-02-07 ENCOUNTER — Encounter (INDEPENDENT_AMBULATORY_CARE_PROVIDER_SITE_OTHER): Payer: Medicare Other

## 2020-02-11 ENCOUNTER — Ambulatory Visit: Payer: Medicare Other | Admitting: Family

## 2020-02-11 ENCOUNTER — Telehealth (INDEPENDENT_AMBULATORY_CARE_PROVIDER_SITE_OTHER): Payer: Self-pay | Admitting: Vascular Surgery

## 2020-02-11 ENCOUNTER — Other Ambulatory Visit (INDEPENDENT_AMBULATORY_CARE_PROVIDER_SITE_OTHER): Payer: Self-pay | Admitting: Nurse Practitioner

## 2020-02-11 NOTE — Telephone Encounter (Signed)
Bring patient in with previous missed ultrasound follow up and patient will need to elevate her left hand to help with swelling per Eulogio Ditch NP.

## 2020-02-11 NOTE — Telephone Encounter (Addendum)
Called stating that late Friday night she started having swelling and pain in her left hand. She also has noticed some discoloration in her hand. She has not taken anything for the pain but would like to come in to be seen. Patient had a vascular thrombectomy 01-16-20 and no showed her 2 week follow up. She would like to be seen soon. I talked to the patient to try and r/s the 2 week follow up but she only wants to see GS and she aslo can only come in on Thursdays because of dialysis. Please advise.

## 2020-02-11 NOTE — Telephone Encounter (Signed)
Spoke to [place] at [facility] to schedule appt.

## 2020-02-14 ENCOUNTER — Ambulatory Visit (INDEPENDENT_AMBULATORY_CARE_PROVIDER_SITE_OTHER): Payer: Medicare Other | Admitting: Nurse Practitioner

## 2020-02-14 ENCOUNTER — Other Ambulatory Visit: Payer: Self-pay

## 2020-02-14 ENCOUNTER — Encounter (INDEPENDENT_AMBULATORY_CARE_PROVIDER_SITE_OTHER): Payer: Self-pay | Admitting: Nurse Practitioner

## 2020-02-14 ENCOUNTER — Ambulatory Visit (INDEPENDENT_AMBULATORY_CARE_PROVIDER_SITE_OTHER): Payer: Medicare Other

## 2020-02-14 VITALS — BP 124/60 | HR 72 | Ht 62.0 in | Wt 190.0 lb

## 2020-02-14 DIAGNOSIS — N185 Chronic kidney disease, stage 5: Secondary | ICD-10-CM

## 2020-02-14 DIAGNOSIS — Z9862 Peripheral vascular angioplasty status: Secondary | ICD-10-CM

## 2020-02-14 DIAGNOSIS — N186 End stage renal disease: Secondary | ICD-10-CM | POA: Diagnosis not present

## 2020-02-14 DIAGNOSIS — T829XXS Unspecified complication of cardiac and vascular prosthetic device, implant and graft, sequela: Secondary | ICD-10-CM | POA: Diagnosis not present

## 2020-02-14 DIAGNOSIS — E785 Hyperlipidemia, unspecified: Secondary | ICD-10-CM

## 2020-02-14 DIAGNOSIS — I10 Essential (primary) hypertension: Secondary | ICD-10-CM

## 2020-02-14 DIAGNOSIS — J45909 Unspecified asthma, uncomplicated: Secondary | ICD-10-CM | POA: Insufficient documentation

## 2020-02-18 ENCOUNTER — Encounter (INDEPENDENT_AMBULATORY_CARE_PROVIDER_SITE_OTHER): Payer: Self-pay | Admitting: Nurse Practitioner

## 2020-02-18 NOTE — Progress Notes (Signed)
Subjective:    Patient ID: Misty Berry, female    DOB: September 17, 1934, 85 y.o.   MRN: [id number] Chief Complaint  Patient presents with  . Follow-up    U/S follow up    Patient follows up today for her left brachial axillary AV graft.  The AV graft was initially placed on 12/12/2019.  Dialysis access has not been used yet.  Prior to its being used the patient required a thrombectomy on 01/16/2020.  The patient is currently maintained via PermCath.  The patient noted that on approximately Saturday she began to have discoloration of her left hand.  The hand is notably cooler than her right.  The patient notes that when her arm is dependent it becomes a deep blue color.  It is noted that when her arm is elevated it becomes pale in color.  This is very noticeable today on physical exam.  It is also noted that during dialysis this becomes worse.  There is some numbness and tingling at times.  The patient is here with her nursing facility a and it was noted that a previous study noted diminished flow and/or small vessels in her arm.  Today noninvasive studies show a flow volume of 3205.  There is no evidence of significant stenosis as well as no evidence of steal based on ultrasound.   Review of Systems  Skin: Positive for color change and pallor.  Neurological: Positive for weakness.  All other systems reviewed and are negative.      Objective:   Physical Exam Vitals reviewed.  HENT:     Head: Normocephalic.  Cardiovascular:     Pulses:          Radial pulses are 2+ on the right side and 1+ on the left side.     Arteriovenous access: left arteriovenous access is present.    Comments: Good thrill and bruit Pulmonary:     Effort: Pulmonary effort is normal.  Musculoskeletal:     Left hand: Decreased capillary refill.  Skin:    General: Skin is cool.     Coloration: Skin is cyanotic (left hand variable).  Neurological:     Mental Status: She is alert and oriented to person, place, and  time.     Motor: Weakness present.     Gait: Gait abnormal.  Psychiatric:        Mood and Affect: Mood normal.        Behavior: Behavior normal.        Thought Content: Thought content normal.        Judgment: Judgment normal.     BP 124/60   Pulse 72   Ht 5\' 2"  (1.575 m)   Wt 190 lb (86.2 kg)   BMI 34.75 kg/m   Past Medical History:  Diagnosis Date  . Asthma   . Bruises easily   . Cellulitis   . CHF (congestive heart failure) ([place])   . Cough   . DDD (degenerative disc disease), lumbar   . GERD (gastroesophageal reflux disease)   . HBP (high blood pressure)   . Hypercholesterolemia   . Hyperparathyroidism ([place])   . Osteoarthritis   . Osteoporosis   . Rash   . Renal disorder    kidney disease  . Swelling     Social History   Socioeconomic History  . Marital status: Single    Spouse name: Not on file  . Number of children: 0  . Years of education: Not on file  .  Highest education level: Not on file  Occupational History  . Not on file  Tobacco Use  . Smoking status: Never Smoker  . Smokeless tobacco: Never Used  Vaping Use  . Vaping Use: Never used  Substance and Sexual Activity  . Alcohol use: No  . Drug use: No  . Sexual activity: Not Currently  Other Topics Concern  . Not on file  Social History Narrative   Lives with room mate at home; Krist[facility] Royal own room    Social Determinants of Health   Financial Resource Strain:   . Difficulty of Paying Living Expenses: Not on file  Food Insecurity:   . Worried About Charity fundraiser in the Last Year: Not on file  . Ran Out of Food in the Last Year: Not on file  Transportation Needs:   . Lack of Transportation (Medical): Not on file  . Lack of Transportation (Non-Medical): Not on file  Physical Activity:   . Days of Exercise per Week: Not on file  . Minutes of Exercise per Session: Not on file  Stress:   . Feeling of Stress : Not on file  Social Connections:   . Frequency of Communication with  Friends and Family: Not on file  . Frequency of Social Gatherings with Friends and Family: Not on file  . Attends Religious Services: Not on file  . Active Member of Clubs or Organizations: Not on file  . Attends Archivist Meetings: Not on file  . Marital Status: Not on file  Intimate Partner Violence:   . Fear of Current or Ex-Partner: Not on file  . Emotionally Abused: Not on file  . Physically Abused: Not on file  . Sexually Abused: Not on file    Past Surgical History:  Procedure Laterality Date  . APPENDECTOMY    . AV FISTULA PLACEMENT Left 12/12/2019   Procedure: INSERTION OF ARTERIOVENOUS (AV) GORE-TEX GRAFT ARM ( BRACHIAL AXILLARY );  Surgeon: Katha Cabal, MD;  Location: [facility];  Service: Vascular;  Laterality: Left;  . BREAST REDUCTION SURGERY    . CHOLECYSTECTOMY    . DIALYSIS/PERMA CATHETER INSERTION N/A 10/02/2019   Procedure: DIALYSIS/PERMA CATHETER INSERTION;  Surgeon: Katha Cabal, MD;  Location: [facility];  Service: Cardiovascular;  Laterality: N/A;  . EYE SURGERY Left   . FEMUR SURGERY    . FOOT SURGERY Right   . GALLBLADDER SURGERY    . LIPECTOMY    . PAROTID GLAND TUMOR EXCISION    . PERIPHERAL VASCULAR THROMBECTOMY Left 01/16/2020   Procedure: PERIPHERAL VASCULAR THROMBECTOMY;  Surgeon: Katha Cabal, MD;  Location: [facility];  Service: Cardiovascular;  Laterality: Left;  . THYROID SURGERY    . TONSILLECTOMY    . TUBAL LIGATION    . VITRECTOMY      History reviewed. No pertinent family history.  Allergies  Allergen Reactions  . Ivp Dye [Iodinated Diagnostic Agents] Other (See Comments)  . Lactose   . Methylprednisolone Other (See Comments)    Reaction: Hallucinations and Psychosis  . Peanuts [Peanut Oil] Other (See Comments)    Swelling and vomitting  . Vancomycin Other (See Comments)    Reaction: unknown  . Wellbutrin [Bupropion] Other (See Comments)    Hallucinations       Assessment & Plan:    1. CKD (chronic kidney disease), stage V (HCC) Recommend:  The patient is experiencing increasing problems with their dialysis access.  While there is no evidence of  steal on ultrasound, physical exam is strongly indicative of possible steal syndrome.  Patient should have an angiogram with the intention for intervention.  The intention for intervention is to restore appropriate flow and prevent thrombosis and possible loss of the access.  As well as improve the quality of dialysis therapy.  The risks, benefits and alternative therapies were reviewed in detail with the patient.  All questions were answered.  The patient agrees to proceed with angio/intervention.      2. Hyperlipidemia, unspecified hyperlipidemia type Continue statin as ordered and reviewed, no changes at this time   3. Essential hypertension Continue antihypertensive medications as already ordered, these medications have been reviewed and there are no changes at this time.   Current Outpatient Medications on File Prior to Visit  Medication Sig Dispense Refill  . acidophilus (RISAQUAD) CAPS capsule Take 2 capsules by mouth 3 (three) times daily. (Patient taking differently: Take 2 capsules by mouth 3 (three) times daily. (0800, 1400, & 2000))    . aspirin 81 MG chewable tablet Chew 81 mg by mouth daily. (0800)    . bumetanide (BUMEX) 1 MG tablet Take 1 tablet (1 mg total) by mouth daily. (Patient taking differently: Take 1 mg by mouth daily. (0800))    . busPIRone (BUSPAR) 5 MG tablet Take 5 mg by mouth daily. (0800)    . calcium carbonate (OSCAL) 1500 (600 Ca) MG TABS tablet Take by mouth 2 (two) times daily with a meal.    . calcium-vitamin D (OSCAL WITH D) 500-200 MG-UNIT tablet Take 1 tablet by mouth daily.    Marland Kitchen docusate sodium (COLACE) 100 MG capsule Take 1 capsule (100 mg total) by mouth 2 (two) times daily. 10 capsule 0  . esomeprazole (NEXIUM) 40 MG capsule Take 40 mg by mouth daily. (0800)    . losartan  (COZAAR) 50 MG tablet Take 1 tablet (50 mg total) by mouth daily. (Patient taking differently: Take 50 mg by mouth daily. (0800) on Mon, Wed, Fri, and Sun)    . metoprolol succinate (TOPROL-XL) 25 MG 24 hr tablet Take 25 mg by mouth daily.    . metoprolol tartrate (LOPRESSOR) 25 MG tablet Take 25 mg by mouth daily. On Mon, Wed, Fri, and Sun    . Multiple Vitamins-Minerals (CENTRUM SILVER PO) Take 1 tablet by mouth daily.    . Omega-3 Fatty Acids (FISH OIL) 1000 MG CAPS Take 4 capsules by mouth daily.    . sertraline (ZOLOFT) 100 MG tablet Take 100 mg by mouth every morning. 0800    . sevelamer carbonate (RENVELA) 800 MG tablet Take 1,600 mg by mouth 3 (three) times daily with meals.    . Skin Protectants, Misc. (BAZA PROTECT EX) Apply 1 application topically every 4 (four) hours. (2000)     . traMADol (ULTRAM) 50 MG tablet Take 1 tablet (50 mg total) by mouth every 6 (six) hours as needed for moderate pain. 8 tablet 0  . Ergocalciferol (VITAMIN D2) 50 MCG (2000 UT) TABS Take 1 tablet by mouth daily. (Patient not taking: Reported on 02/14/2020)    . ferrous sulfate 325 (65 FE) MG tablet Take 325 mg by mouth daily with breakfast. (Patient not taking: Reported on 02/14/2020)    . hydrALAZINE (APRESOLINE) 25 MG tablet Take 1 tablet (25 mg total) by mouth every 8 (eight) hours. (Patient not taking: Reported on 02/14/2020)    . HYDROcodone-acetaminophen (NORCO) 5-325 MG tablet Take 1-2 tablets by mouth every 6 (six) hours as needed for  moderate pain or severe pain. (Patient not taking: Reported on 02/05/2020) 40 tablet 0  . sulfamethoxazole-trimethoprim (BACTRIM DS) 800-160 MG tablet Take 1 tablet by mouth daily. (Patient not taking: Reported on 02/05/2020)     No current facility-administered medications on file prior to visit.    There are no Patient Instructions on file for this visit. No follow-ups on file.   Kris Hartmann, NP

## 2020-02-18 NOTE — H&P (View-Only) (Signed)
Subjective:    Patient ID: Misty Berry, female    DOB: January 13, 1935, 85 y.o.   MRN: [id number] Chief Complaint  Patient presents with   Follow-up    U/S follow up    Patient follows up today for her left brachial axillary AV graft.  The AV graft was initially placed on 12/12/2019.  Dialysis access has not been used yet.  Prior to its being used the patient required a thrombectomy on 01/16/2020.  The patient is currently maintained via PermCath.  The patient noted that on approximately Saturday she began to have discoloration of her left hand.  The hand is notably cooler than her right.  The patient notes that when her arm is dependent it becomes a deep blue color.  It is noted that when her arm is elevated it becomes pale in color.  This is very noticeable today on physical exam.  It is also noted that during dialysis this becomes worse.  There is some numbness and tingling at times.  The patient is here with her nursing facility a and it was noted that a previous study noted diminished flow and/or small vessels in her arm.  Today noninvasive studies show a flow volume of 3205.  There is no evidence of significant stenosis as well as no evidence of steal based on ultrasound.   Review of Systems  Skin: Positive for color change and pallor.  Neurological: Positive for weakness.  All other systems reviewed and are negative.      Objective:   Physical Exam Vitals reviewed.  HENT:     Head: Normocephalic.  Cardiovascular:     Pulses:          Radial pulses are 2+ on the right side and 1+ on the left side.     Arteriovenous access: left arteriovenous access is present.    Comments: Good thrill and bruit Pulmonary:     Effort: Pulmonary effort is normal.  Musculoskeletal:     Left hand: Decreased capillary refill.  Skin:    General: Skin is cool.     Coloration: Skin is cyanotic (left hand variable).  Neurological:     Mental Status: She is alert and oriented to person, place, and  time.     Motor: Weakness present.     Gait: Gait abnormal.  Psychiatric:        Mood and Affect: Mood normal.        Behavior: Behavior normal.        Thought Content: Thought content normal.        Judgment: Judgment normal.     BP 124/60    Pulse 72    Ht 5\' 2"  (1.575 m)    Wt 190 lb (86.2 kg)    BMI 34.75 kg/m   Past Medical History:  Diagnosis Date   Asthma    Bruises easily    Cellulitis    CHF (congestive heart failure) (HCC)    Cough    DDD (degenerative disc disease), lumbar    GERD (gastroesophageal reflux disease)    HBP (high blood pressure)    Hypercholesterolemia    Hyperparathyroidism (HCC)    Osteoarthritis    Osteoporosis    Rash    Renal disorder    kidney disease   Swelling     Social History   Socioeconomic History   Marital status: Single    Spouse name: Not on file   Number of children: 0   Years of education:  Not on file   Highest education level: Not on file  Occupational History   Not on file  Tobacco Use   Smoking status: Never Smoker   Smokeless tobacco: Never Used  Vaping Use   Vaping Use: Never used  Substance and Sexual Activity   Alcohol use: No   Drug use: No   Sexual activity: Not Currently  Other Topics Concern   Not on file  Social History Narrative   Lives with room mate at home; [facility] own room    Social Determinants of Health   Financial Resource Strain:    Difficulty of Paying Living Expenses: Not on file  Food Insecurity:    Worried About Charity fundraiser in the Last Year: Not on file   [employer] of Food in the Last Year: Not on file  Transportation Needs:    Lack of Transportation (Medical): Not on file   Lack of Transportation (Non-Medical): Not on file  Physical Activity:    Days of Exercise per Week: Not on file   Minutes of Exercise per Session: Not on file  Stress:    Feeling of Stress : Not on file  Social Connections:    Frequency of Communication with  Friends and Family: Not on file   Frequency of Social Gatherings with Friends and Family: Not on file   Attends Religious Services: Not on file   Active Member of Clubs or Organizations: Not on file   Attends Archivist Meetings: Not on file   Marital Status: Not on file  Intimate Partner Violence:    Fear of Current or Ex-Partner: Not on file   Emotionally Abused: Not on file   Physically Abused: Not on file   Sexually Abused: Not on file    Past Surgical History:  Procedure Laterality Date   APPENDECTOMY     AV FISTULA PLACEMENT Left 12/12/2019   Procedure: INSERTION OF ARTERIOVENOUS (AV) GORE-TEX GRAFT ARM ( BRACHIAL AXILLARY );  Surgeon: Katha Cabal, MD;  Location: [facility];  Service: Vascular;  Laterality: Left;   BREAST REDUCTION SURGERY     CHOLECYSTECTOMY     DIALYSIS/PERMA CATHETER INSERTION N/A 10/02/2019   Procedure: DIALYSIS/PERMA CATHETER INSERTION;  Surgeon: Katha Cabal, MD;  Location: [facility];  Service: Cardiovascular;  Laterality: N/A;   EYE SURGERY Left    FEMUR SURGERY     FOOT SURGERY Right    GALLBLADDER SURGERY     LIPECTOMY     PAROTID GLAND TUMOR EXCISION     PERIPHERAL VASCULAR THROMBECTOMY Left 01/16/2020   Procedure: PERIPHERAL VASCULAR THROMBECTOMY;  Surgeon: Katha Cabal, MD;  Location: [facility];  Service: Cardiovascular;  Laterality: Left;   THYROID SURGERY     TONSILLECTOMY     TUBAL LIGATION     VITRECTOMY      History reviewed. No pertinent family history.  Allergies  Allergen Reactions   Ivp Dye [Iodinated Diagnostic Agents] Other (See Comments)   Lactose    Methylprednisolone Other (See Comments)    Reaction: Hallucinations and Psychosis   Peanuts [Peanut Oil] Other (See Comments)    Swelling and vomitting   Vancomycin Other (See Comments)    Reaction: unknown   Wellbutrin [Bupropion] Other (See Comments)    Hallucinations       Assessment & Plan:    1. CKD (chronic kidney disease), stage V (HCC) Recommend:  The patient is experiencing increasing problems with their dialysis access.  While  there is no evidence of steal on ultrasound, physical exam is strongly indicative of possible steal syndrome.  Patient should have an angiogram with the intention for intervention.  The intention for intervention is to restore appropriate flow and prevent thrombosis and possible loss of the access.  As well as improve the quality of dialysis therapy.  The risks, benefits and alternative therapies were reviewed in detail with the patient.  All questions were answered.  The patient agrees to proceed with angio/intervention.      2. Hyperlipidemia, unspecified hyperlipidemia type Continue statin as ordered and reviewed, no changes at this time   3. Essential hypertension Continue antihypertensive medications as already ordered, these medications have been reviewed and there are no changes at this time.   Current Outpatient Medications on File Prior to Visit  Medication Sig Dispense Refill   acidophilus (RISAQUAD) CAPS capsule Take 2 capsules by mouth 3 (three) times daily. (Patient taking differently: Take 2 capsules by mouth 3 (three) times daily. (0800, 1400, & 2000))     aspirin 81 MG chewable tablet Chew 81 mg by mouth daily. (0800)     bumetanide (BUMEX) 1 MG tablet Take 1 tablet (1 mg total) by mouth daily. (Patient taking differently: Take 1 mg by mouth daily. (0800))     busPIRone (BUSPAR) 5 MG tablet Take 5 mg by mouth daily. (0800)     calcium carbonate (OSCAL) 1500 (600 Ca) MG TABS tablet Take by mouth 2 (two) times daily with a meal.     calcium-vitamin D (OSCAL WITH D) 500-200 MG-UNIT tablet Take 1 tablet by mouth daily.     docusate sodium (COLACE) 100 MG capsule Take 1 capsule (100 mg total) by mouth 2 (two) times daily. 10 capsule 0   esomeprazole (NEXIUM) 40 MG capsule Take 40 mg by mouth daily. (0800)     losartan  (COZAAR) 50 MG tablet Take 1 tablet (50 mg total) by mouth daily. (Patient taking differently: Take 50 mg by mouth daily. (0800) on Mon, Wed, Fri, and Sun)     metoprolol succinate (TOPROL-XL) 25 MG 24 hr tablet Take 25 mg by mouth daily.     metoprolol tartrate (LOPRESSOR) 25 MG tablet Take 25 mg by mouth daily. On Mon, Wed, Fri, and Sun     Multiple Vitamins-Minerals (CENTRUM SILVER PO) Take 1 tablet by mouth daily.     Omega-3 Fatty Acids (FISH OIL) 1000 MG CAPS Take 4 capsules by mouth daily.     sertraline (ZOLOFT) 100 MG tablet Take 100 mg by mouth every morning. 0800     sevelamer carbonate (RENVELA) 800 MG tablet Take 1,600 mg by mouth 3 (three) times daily with meals.     Skin Protectants, Misc. (BAZA PROTECT EX) Apply 1 application topically every 4 (four) hours. (2000)      traMADol (ULTRAM) 50 MG tablet Take 1 tablet (50 mg total) by mouth every 6 (six) hours as needed for moderate pain. 8 tablet 0   Ergocalciferol (VITAMIN D2) 50 MCG (2000 UT) TABS Take 1 tablet by mouth daily. (Patient not taking: Reported on 02/14/2020)     ferrous sulfate 325 (65 FE) MG tablet Take 325 mg by mouth daily with breakfast. (Patient not taking: Reported on 02/14/2020)     hydrALAZINE (APRESOLINE) 25 MG tablet Take 1 tablet (25 mg total) by mouth every 8 (eight) hours. (Patient not taking: Reported on 02/14/2020)     HYDROcodone-acetaminophen (NORCO) 5-325 MG tablet Take 1-2 tablets by mouth every 6 (  six) hours as needed for moderate pain or severe pain. (Patient not taking: Reported on 02/05/2020) 40 tablet 0   sulfamethoxazole-trimethoprim (BACTRIM DS) 800-160 MG tablet Take 1 tablet by mouth daily. (Patient not taking: Reported on 02/05/2020)     No current facility-administered medications on file prior to visit.    There are no Patient Instructions on file for this visit. No follow-ups on file.   Kris Hartmann, NP

## 2020-02-19 ENCOUNTER — Telehealth (INDEPENDENT_AMBULATORY_CARE_PROVIDER_SITE_OTHER): Payer: Self-pay

## 2020-02-19 NOTE — Telephone Encounter (Signed)
Spoke with Lyman Speller regarding getting the patient scheduled with Dr. Delana Meyer for a LUE angio on 02/26/20 with a 1:00 pm arrival time to the [facility]. Covid testing on 02/22/20 between 8-1 pm at the [facility]. Pre-procedure instructions were discussed and will be faxed to Lyman Speller.

## 2020-02-20 ENCOUNTER — Other Ambulatory Visit: Admission: RE | Admit: 2020-02-20 | Payer: Medicare Other | Source: Ambulatory Visit

## 2020-02-21 ENCOUNTER — Ambulatory Visit (INDEPENDENT_AMBULATORY_CARE_PROVIDER_SITE_OTHER): Payer: Medicare Other | Admitting: Family

## 2020-02-21 ENCOUNTER — Encounter: Payer: Self-pay | Admitting: Family

## 2020-02-21 ENCOUNTER — Other Ambulatory Visit: Payer: Self-pay

## 2020-02-21 VITALS — BP 100/60 | HR 76 | Ht 61.5 in | Wt 186.0 lb

## 2020-02-21 DIAGNOSIS — I428 Other cardiomyopathies: Secondary | ICD-10-CM

## 2020-02-21 DIAGNOSIS — N186 End stage renal disease: Secondary | ICD-10-CM | POA: Diagnosis not present

## 2020-02-21 DIAGNOSIS — I5022 Chronic systolic (congestive) heart failure: Secondary | ICD-10-CM | POA: Diagnosis not present

## 2020-02-21 NOTE — Patient Instructions (Signed)
Medication Instructions:  No medication changes today.   *If you need a refill on your cardiac medications before your next appointment, please call your pharmacy*  Lab Work: No lab work today.   Testing/Procedures: Your EKG today was stable compared to previous.  Follow-Up: At [facility], you and your health needs are our priority.  As part of our continuing mission to provide you with exceptional heart care, we have created designated Provider Care Teams.  These Care Teams include your primary Cardiologist (physician) and Advanced Practice Providers (APPs -  Physician Assistants and Nurse Practitioners) who all work together to provide you with the care you need, when you need it.  We recommend signing up for the patient portal called "MyChart".  Sign up information is provided on this After Visit Summary.  MyChart is used to connect with patients for Virtual Visits (Telemedicine).  Patients are able to view lab/test results, encounter notes, upcoming appointments, etc.  Non-urgent messages can be sent to your provider as well.   To learn more about what you can do with MyChart, go to [email or web address].    Your next appointment:   4 month(s)  The format for your next appointment:   In Person  Provider:   You may see Kate Sable, MD or one of the following Advanced Practice Providers on your designated Care Team:    Murray Hodgkins, NP  Christell Faith, PA-C  Laurann Montana, NP  Marrianne Mood, PA-C  Other Instructions  Continue your low sodium, heart healthy diet.   Continue to elevate your legs when sitting.   If you notice worsening shortness of breath, chest pain, or swelling please call our office.

## 2020-02-21 NOTE — Progress Notes (Signed)
Office Visit    Patient Name: Misty Berry Date of Encounter: 02/21/2020  Primary Care Provider:  Franciso Bend, MD Primary Cardiologist:  Kate Sable, MD Electrophysiologist:  None   Chief Complaint    Misty Berry is a 85 y.o. female with a hx of HTN, HLD, HFrEF/NICM, HTN, ESRD  presents today for follow-up of heart failure  Past Medical History    Past Medical History:  Diagnosis Date  . Asthma   . Bruises easily   . Cellulitis   . CHF (congestive heart failure) ([place])   . Cough   . DDD (degenerative disc disease), lumbar   . GERD (gastroesophageal reflux disease)   . HBP (high blood pressure)   . Hypercholesterolemia   . Hyperparathyroidism ([place])   . Osteoarthritis   . Osteoporosis   . Rash   . Renal disorder    kidney disease  . Swelling    Past Surgical History:  Procedure Laterality Date  . APPENDECTOMY    . AV FISTULA PLACEMENT Left 12/12/2019   Procedure: INSERTION OF ARTERIOVENOUS (AV) GORE-TEX GRAFT ARM ( BRACHIAL AXILLARY );  Surgeon: Katha Cabal, MD;  Location: [facility];  Service: Vascular;  Laterality: Left;  . BREAST REDUCTION SURGERY    . CHOLECYSTECTOMY    . DIALYSIS/PERMA CATHETER INSERTION N/A 10/02/2019   Procedure: DIALYSIS/PERMA CATHETER INSERTION;  Surgeon: Katha Cabal, MD;  Location: [facility];  Service: Cardiovascular;  Laterality: N/A;  . EYE SURGERY Left   . FEMUR SURGERY    . FOOT SURGERY Right   . GALLBLADDER SURGERY    . LIPECTOMY    . PAROTID GLAND TUMOR EXCISION    . PERIPHERAL VASCULAR THROMBECTOMY Left 01/16/2020   Procedure: PERIPHERAL VASCULAR THROMBECTOMY;  Surgeon: Katha Cabal, MD;  Location: [facility];  Service: Cardiovascular;  Laterality: Left;  . THYROID SURGERY    . TONSILLECTOMY    . TUBAL LIGATION    . VITRECTOMY      Allergies  Allergies  Allergen Reactions  . Ivp Dye [Iodinated Diagnostic Agents] Other (See Comments)  . Lactose     unknown  .  Methylprednisolone Other (See Comments)    Reaction: Hallucinations and Psychosis  . Peanuts [Peanut Oil] Other (See Comments)    Swelling and vomitting  . Vancomycin Other (See Comments)    Reaction: unknown  . Wellbutrin [Bupropion] Other (See Comments)    Hallucinations    History of Present Illness    Misty Berry is a 85 y.o. female with a hx of HTN, HLD HFrEF/NICM, HTN, ESRD last seen 02/05/20 by Darylene Price, NP.  No history of CAD or heart attack. She has previously been followed with cardiology [facility].  Admitted 06/2019.  She Lexiscan Myoview during mission showed small defect of moderate severity in apex location likely breast attenuation artifact, intermediate risk study due to reduced EF, no evidence of reversibility cystograms ischemia no perforation deficits to suggest previous infarct.  Echocardiogram 06/27/2019 LVEF 35-40%, LV inferior wall hypokinesis, no significant valvular abnormalities.  Admitted 09/28/2027 due to chronic heart failure with worsening renal function..  Vascular, wound, palliative care consults were obtained.  She was started on dialysis due to worsening renal function. She was discharged to SNF at [name] and has since returned to [place].  She enjoyed her time participating in physical therapy at [place] and is hopeful to be able to continue physical therapy therapy at [facility].  She was last  seen 12/12/19 for preoperative clearance for insertion of AV Gore-Tex graft arm today with Dr. Franchot Gallo of vascular. She was doing well with no anginal symptoms. No changes were made.   ED visit 01/18/20 for hypotension treated with IVF and abx.   She was seen by [facility] HF clinic 02/05/20. She had pedal edema which was stable. No changes were made.   She has had multiple issues with her dialysis access. Physical exam suggestive of steal syndrome. She is scheduled for LUE angiography 02/26/20 with Dr. Delana Meyer.   Presents today for follow up.  Reports she is feeling overall well. 12/12/19 her weight in clinic was 194lbs. Today it is 186 lbs. Reports being very careful about her diet. Does "cheat" on occasion and have some sugar when she goes out to eat. Drinking less than 2L of fluid. Reports no shortness of breath. Reports stable dyspnea on exertion. Reports no chest pain, pressure, or tightness. No edema, orthopnea, PND. Reports no palpitations.   EKGs/Labs/Other Studies Reviewed:   The following studies were reviewed today:  2D Echocardiogram  TTE 06/27/2019 1. Left ventricular ejection fraction, by visual estimation, is 35 to 40%. The left ventricle has moderately decreased function. There is no left ventricular hypertrophy.  2. Left ventricular diastolic function could not be evaluated.  3. The left ventricle demonstrates regional wall motion abnormalities.  4. LV inferior wall hypokinesis.  5. Global right ventricle was not well visualized.The right ventricular size is not assessed. Right vetricular wall thickness was not assessed.  6. Left atrial size was normal.  7. Right atrial size was not well visualized.  8. The mitral valve is normal in structure. No evidence of mitral valve regurgitation.  9. The tricuspid valve is grossly normal. 10. The aortic valve is grossly normal. Aortic valve regurgitation is not visualized. 11. Pulmonic regurgitation not assessed. 12. The pulmonic valve was not well visualized. Pulmonic valve regurgitation not assessed. 13. The interatrial septum was not well visualized.  Lexiscan Myoview 06/2019 Defect 1: There is a small defect of moderate severity present in the apex location. This is likely due to breast attenuation artifact This is an intermediate risk study due to reduced EF The left ventricular ejection fraction is moderately decreased (30-44%). No evidence of reversibility to suggest ischemia and no large perfusion defects to suggest previous infarct. Suspect nonischemic  cardiomyopathy.  EKG:  EKG is  ordered today.  The ekg ordered today demonstrates normal sinus rhythm 76 bpm with LVH.  No acute ST/T wave changes.  Recent Labs: 06/27/2019: TSH 1.138 10/01/2019: B Natriuretic Peptide 690.0 01/18/2020: ALT 12; BUN 11; Creatinine, Ser 2.08; Hemoglobin 10.6; Platelets 200; Potassium 4.2; Sodium 137  Recent Lipid Panel    Component Value Date/Time   TRIG 224 (H) 02/07/2013 1140    Home Medications   Current Meds  Medication Sig  . acidophilus (RISAQUAD) CAPS capsule Take 2 capsules by mouth 3 (three) times daily.  Marland Kitchen aspirin 81 MG chewable tablet Chew 81 mg by mouth daily. (0800)  . bumetanide (BUMEX) 1 MG tablet Take 1 tablet (1 mg total) by mouth daily. (Patient taking differently: Take 1 mg by mouth daily. Hold for SBP less than 110)  . busPIRone (BUSPAR) 5 MG tablet Take 5 mg by mouth daily. (0800)  . calcium carbonate (OSCAL) 1500 (600 Ca) MG TABS tablet Take by mouth 2 (two) times daily with a meal.  . calcium-vitamin D (OSCAL WITH D) 500-200 MG-UNIT tablet Take 1 tablet by mouth daily.  Marland Kitchen docusate  sodium (COLACE) 100 MG capsule Take 1 capsule (100 mg total) by mouth 2 (two) times daily.  . Ergocalciferol (VITAMIN D2) 50 MCG (2000 UT) TABS Take 1 tablet by mouth daily.   Marland Kitchen esomeprazole (NEXIUM) 40 MG capsule Take 40 mg by mouth daily. (0800)  . ferrous sulfate 325 (65 FE) MG tablet Take 325 mg by mouth daily with breakfast.   . losartan (COZAAR) 50 MG tablet Take 1 tablet (50 mg total) by mouth daily. (Patient taking differently: Take 50 mg by mouth daily. (0800) on Mon, Wed, Fri, and Sun)  . metoprolol succinate (TOPROL-XL) 25 MG 24 hr tablet Take 25 mg by mouth daily.  . metoprolol tartrate (LOPRESSOR) 25 MG tablet Take 25 mg by mouth See admin instructions. Take 25 mg once daily on Mon, Wed, Fri, and Sun. Hold for SBP less than 110  . Multiple Vitamins-Minerals (CENTRUM SILVER PO) Take 1 tablet by mouth daily.  . Omega-3 Fatty Acids (FISH OIL) 1000  MG CAPS Take 4 capsules by mouth daily.  . sertraline (ZOLOFT) 100 MG tablet Take 100 mg by mouth every morning. 0800  . sevelamer carbonate (RENVELA) 800 MG tablet Take 1,600 mg by mouth 3 (three) times daily with meals.  . Skin Protectants, Misc. (BAZA PROTECT EX) Apply 1 application topically every 4 (four) hours. (2000)   . sulfamethoxazole-trimethoprim (BACTRIM DS) 800-160 MG tablet Take 1 tablet by mouth daily.   . traMADol (ULTRAM) 50 MG tablet Take 1 tablet (50 mg total) by mouth every 6 (six) hours as needed for moderate pain.    Review of Systems      Review of Systems  Constitutional: Negative for chills, fever and malaise/fatigue.  Cardiovascular: Positive for dyspnea on exertion. Negative for chest pain, leg swelling, near-syncope, orthopnea, palpitations and syncope.  Respiratory: Negative for cough, shortness of breath and wheezing.   Gastrointestinal: Negative for nausea and vomiting.  Neurological: Negative for dizziness, light-headedness and weakness.   All other systems reviewed and are otherwise negative except as noted above.  Physical Exam    VS:  BP 100/60 (BP Location: Right Arm, Patient Position: Sitting, Cuff Size: Normal)   Pulse 76   Ht 5' 1.5" (1.562 m)   Wt 186 lb (84.4 kg)   BMI 34.58 kg/m  , BMI Body mass index is 34.58 kg/m. GEN: Well nourished, overweight, well developed, in no acute distress. HEENT: normal. Neck: Supple, no JVD, carotid bruits, or masses. Cardiac: RRR, no murmurs, rubs, or gallops. No clubbing, cyanosis.  Radials/DP/PT 2+ and equal bilaterally. RLE with edema and mild erythema. Respiratory:  Respirations regular and unlabored, clear to auscultation bilaterally. GI: Soft, nontender, nondistended, BS + x 4. MS: No deformity or atrophy. Skin: Warm and dry, no rash.  LLE in [employer]. Neuro:  Strength and sensation are intact. Psych: Normal affect.  Assessment & Plan    1. HFrEF/NICM - NYHA II with dyspnea on exertion. Euvolemic  on exam and well compensate. LVEF 35-40% by echo 06/2019. Low risk stress test in January indicate nonischemic cardiomyopathy.  Heart failure therapies limited by ESRD. GDMT includes losartan, metoprolol. Hydralazine has been discontinued since last seen, likely due to hypotension thought she is unclear. Volume management per HD managed by nephrology. Consider additional heart failure therapies at follow-up as able.  Encourage low-sodium, heart diet.  Encourage fluid restriction less than 2L.  2. ESRD - Continue to follow with nephrology and hemodialysis.  Heart failure therapy limited by ESRD. Volume management per HD.  Avoid nephrotoxic agents.   Disposition: Follow up in 4 month(s) with Dr. Justice Deeds, NP 02/21/2020, 4:47 PM

## 2020-02-22 ENCOUNTER — Other Ambulatory Visit
Admission: RE | Admit: 2020-02-22 | Discharge: 2020-02-22 | Disposition: A | Discharge status: Home / Self Care | Payer: Medicare Other | Source: Ambulatory Visit | Attending: Vascular Surgery | Admitting: Vascular Surgery

## 2020-02-22 DIAGNOSIS — Z20822 Contact with and (suspected) exposure to covid-19: Secondary | ICD-10-CM | POA: Diagnosis not present

## 2020-02-22 DIAGNOSIS — Z01812 Encounter for preprocedural laboratory examination: Secondary | ICD-10-CM | POA: Diagnosis present

## 2020-02-22 LAB — SARS CORONAVIRUS 2 (TAT 6-24 HRS): SARS Coronavirus 2: NEGATIVE

## 2020-02-26 ENCOUNTER — Encounter
Admission: RE | Disposition: A | Discharge status: Home / Self Care | Payer: Self-pay | Source: Home / Self Care | Attending: Vascular Surgery

## 2020-02-26 ENCOUNTER — Ambulatory Visit
Admission: RE | Admit: 2020-02-26 | Discharge: 2020-02-26 | Disposition: A | Discharge status: Home / Self Care | Payer: Medicare Other | Attending: Vascular Surgery | Admitting: Vascular Surgery

## 2020-02-26 ENCOUNTER — Other Ambulatory Visit (INDEPENDENT_AMBULATORY_CARE_PROVIDER_SITE_OTHER): Payer: Self-pay | Admitting: Nurse Practitioner

## 2020-02-26 ENCOUNTER — Other Ambulatory Visit: Payer: Self-pay

## 2020-02-26 ENCOUNTER — Encounter: Payer: Self-pay | Admitting: Vascular Surgery

## 2020-02-26 DIAGNOSIS — T82898A Other specified complication of vascular prosthetic devices, implants and grafts, initial encounter: Secondary | ICD-10-CM | POA: Insufficient documentation

## 2020-02-26 DIAGNOSIS — M810 Age-related osteoporosis without current pathological fracture: Secondary | ICD-10-CM | POA: Diagnosis not present

## 2020-02-26 DIAGNOSIS — Z881 Allergy status to other antibiotic agents status: Secondary | ICD-10-CM | POA: Insufficient documentation

## 2020-02-26 DIAGNOSIS — N185 Chronic kidney disease, stage 5: Secondary | ICD-10-CM | POA: Diagnosis not present

## 2020-02-26 DIAGNOSIS — E785 Hyperlipidemia, unspecified: Secondary | ICD-10-CM | POA: Insufficient documentation

## 2020-02-26 DIAGNOSIS — I132 Hypertensive heart and chronic kidney disease with heart failure and with stage 5 chronic kidney disease, or end stage renal disease: Secondary | ICD-10-CM | POA: Insufficient documentation

## 2020-02-26 DIAGNOSIS — Z95828 Presence of other vascular implants and grafts: Secondary | ICD-10-CM | POA: Insufficient documentation

## 2020-02-26 DIAGNOSIS — Y832 Surgical operation with anastomosis, bypass or graft as the cause of abnormal reaction of the patient, or of later complication, without mention of misadventure at the time of the procedure: Secondary | ICD-10-CM | POA: Diagnosis not present

## 2020-02-26 DIAGNOSIS — Z79899 Other long term (current) drug therapy: Secondary | ICD-10-CM | POA: Insufficient documentation

## 2020-02-26 DIAGNOSIS — M1990 Unspecified osteoarthritis, unspecified site: Secondary | ICD-10-CM | POA: Insufficient documentation

## 2020-02-26 DIAGNOSIS — Z91041 Radiographic dye allergy status: Secondary | ICD-10-CM | POA: Diagnosis not present

## 2020-02-26 DIAGNOSIS — Z888 Allergy status to other drugs, medicaments and biological substances status: Secondary | ICD-10-CM | POA: Insufficient documentation

## 2020-02-26 DIAGNOSIS — Z7982 Long term (current) use of aspirin: Secondary | ICD-10-CM | POA: Insufficient documentation

## 2020-02-26 DIAGNOSIS — I509 Heart failure, unspecified: Secondary | ICD-10-CM | POA: Diagnosis not present

## 2020-02-26 DIAGNOSIS — K219 Gastro-esophageal reflux disease without esophagitis: Secondary | ICD-10-CM | POA: Insufficient documentation

## 2020-02-26 DIAGNOSIS — E213 Hyperparathyroidism, unspecified: Secondary | ICD-10-CM | POA: Insufficient documentation

## 2020-02-26 DIAGNOSIS — I7789 Other specified disorders of arteries and arterioles: Secondary | ICD-10-CM

## 2020-02-26 HISTORY — PX: UPPER EXTREMITY ANGIOGRAPHY: CATH118270

## 2020-02-26 LAB — POTASSIUM (ARMC VASCULAR LAB ONLY): Potassium (ARMC vascular lab): 4.7 (ref 3.5–5.1)

## 2020-02-26 SURGERY — UPPER EXTREMITY ANGIOGRAPHY
Anesthesia: Moderate Sedation | Laterality: Left

## 2020-02-26 MED ORDER — CLOPIDOGREL BISULFATE 75 MG PO TABS: 75.0000 mg | ORAL_TABLET | Freq: Every day | ORAL | 5 refills | Status: AC

## 2020-02-26 MED ORDER — DIPHENHYDRAMINE HCL 50 MG/ML IJ SOLN
INTRAMUSCULAR | Status: AC
  Filled 2020-02-26: qty 1

## 2020-02-26 MED ORDER — HEPARIN SODIUM (PORCINE) 1000 UNIT/ML IJ SOLN
INTRAMUSCULAR | Status: AC
  Filled 2020-02-26: qty 1

## 2020-02-26 MED ORDER — CLOPIDOGREL BISULFATE 75 MG PO TABS
ORAL_TABLET | ORAL | Status: AC
  Filled 2020-02-26: qty 1

## 2020-02-26 MED ORDER — MIDAZOLAM HCL 2 MG/2ML IJ SOLN
INTRAMUSCULAR | Status: DC | PRN
  Administered 2020-02-26 (×4): 1 mg via INTRAVENOUS

## 2020-02-26 MED ORDER — HYDROCORTISONE NA SUCCINATE PF 100 MG IJ SOLR
50.0000 mg | INTRAMUSCULAR | Status: AC
  Administered 2020-02-26: 50 mg via INTRAVENOUS
  Filled 2020-02-26 (×2): qty 1

## 2020-02-26 MED ORDER — MORPHINE SULFATE (PF) 4 MG/ML IV SOLN: 2.0000 mg | INTRAVENOUS | Status: DC | PRN

## 2020-02-26 MED ORDER — HEPARIN SODIUM (PORCINE) 1000 UNIT/ML IJ SOLN
INTRAMUSCULAR | Status: DC | PRN
  Administered 2020-02-26: 4000 [IU] via INTRAVENOUS

## 2020-02-26 MED ORDER — CEFAZOLIN SODIUM-DEXTROSE 1-4 GM/50ML-% IV SOLN: 1.0000 g | Freq: Once | INTRAVENOUS | Status: AC

## 2020-02-26 MED ORDER — HYDROMORPHONE HCL 1 MG/ML IJ SOLN: 1.0000 mg | Freq: Once | INTRAMUSCULAR | Status: DC | PRN

## 2020-02-26 MED ORDER — METHYLPREDNISOLONE SODIUM SUCC 125 MG IJ SOLR: 125.0000 mg | Freq: Once | INTRAMUSCULAR | Status: DC | PRN

## 2020-02-26 MED ORDER — IODIXANOL 320 MG/ML IV SOLN
INTRAVENOUS | Status: DC | PRN
  Administered 2020-02-26: 50 mL

## 2020-02-26 MED ORDER — FENTANYL CITRATE (PF) 100 MCG/2ML IJ SOLN
INTRAMUSCULAR | Status: DC | PRN
Start: 2020-02-26 — End: 2020-02-26
  Administered 2020-02-26: 25 ug via INTRAVENOUS
  Administered 2020-02-26 (×2): 12.5 ug via INTRAVENOUS
  Administered 2020-02-26: 50 ug via INTRAVENOUS

## 2020-02-26 MED ORDER — DIPHENHYDRAMINE HCL 50 MG/ML IJ SOLN
50.0000 mg | Freq: Once | INTRAMUSCULAR | Status: AC | PRN
  Administered 2020-02-26: 50 mg via INTRAVENOUS

## 2020-02-26 MED ORDER — MIDAZOLAM HCL 5 MG/5ML IJ SOLN
INTRAMUSCULAR | Status: AC
  Filled 2020-02-26: qty 5

## 2020-02-26 MED ORDER — FAMOTIDINE 20 MG PO TABS
ORAL_TABLET | ORAL | Status: AC
  Filled 2020-02-26: qty 1

## 2020-02-26 MED ORDER — CLOPIDOGREL BISULFATE 75 MG PO TABS
75.0000 mg | ORAL_TABLET | Freq: Once | ORAL | Status: AC
  Administered 2020-02-26: 75 mg via ORAL

## 2020-02-26 MED ORDER — SODIUM CHLORIDE 0.9 % IV SOLN: INTRAVENOUS | Status: DC

## 2020-02-26 MED ORDER — FAMOTIDINE 20 MG PO TABS
40.0000 mg | ORAL_TABLET | Freq: Once | ORAL | Status: AC | PRN
  Administered 2020-02-26: 40 mg via ORAL

## 2020-02-26 MED ORDER — MIDAZOLAM HCL 2 MG/ML PO SYRP: 8.0000 mg | ORAL_SOLUTION | Freq: Once | ORAL | Status: DC | PRN

## 2020-02-26 MED ORDER — NITROGLYCERIN 1 MG/10 ML FOR IR/CATH LAB
INTRA_ARTERIAL | Status: DC | PRN
  Administered 2020-02-26: 200 ug via INTRA_ARTERIAL
  Administered 2020-02-26: 100 ug via INTRA_ARTERIAL
  Administered 2020-02-26: 300 ug via INTRA_ARTERIAL

## 2020-02-26 MED ORDER — FENTANYL CITRATE (PF) 100 MCG/2ML IJ SOLN
INTRAMUSCULAR | Status: AC
  Filled 2020-02-26: qty 2

## 2020-02-26 MED ORDER — CEFAZOLIN SODIUM-DEXTROSE 1-4 GM/50ML-% IV SOLN
INTRAVENOUS | Status: AC
  Administered 2020-02-26: 1 g via INTRAVENOUS
  Filled 2020-02-26: qty 50

## 2020-02-26 MED ORDER — ONDANSETRON HCL 4 MG/2ML IJ SOLN: 4.0000 mg | Freq: Four times a day (QID) | INTRAMUSCULAR | Status: DC | PRN

## 2020-02-26 SURGICAL SUPPLY — 18 items
BALLN STERLING OTW 2X40X150 (BALLOONS) ×3
BALLOON STERLING OTW 2X40X150 (BALLOONS) ×1 IMPLANT
CATH ANGIO 5F 100CM .035 PIG (CATHETERS) ×3 IMPLANT
CATH BEACON 5 .035 100 H1 TIP (CATHETERS) ×3 IMPLANT
CATH VERT 5FR 125CM (CATHETERS) ×3 IMPLANT
DEVICE STARCLOSE SE CLOSURE (Vascular Products) ×3 IMPLANT
DEVICE TORQUE .025-.038 (MISCELLANEOUS) ×3 IMPLANT
GLIDEWIRE ANGLED SS 035X260CM (WIRE) ×3 IMPLANT
GUIDEWIRE PFTE-COATED .018X300 (WIRE) ×3 IMPLANT
KIT ENCORE 26 ADVANTAGE (KITS) ×3 IMPLANT
NEEDLE ENTRY 21GA 7CM ECHOTIP (NEEDLE) ×3 IMPLANT
SET INTRO CAPELLA COAXIAL (SET/KITS/TRAYS/PACK) ×3 IMPLANT
SHEATH BRITE TIP 5FRX11 (SHEATH) ×3 IMPLANT
SHEATH SHUTTLE SELECT 6F (SHEATH) ×3 IMPLANT
SYR MEDRAD MARK 7 150ML (SYRINGE) ×3 IMPLANT
TUBING CONTRAST HIGH PRESS 72 (TUBING) ×6 IMPLANT
VALVE CHECKFLO PERFORMER (SHEATH) ×6 IMPLANT
WIRE J 3MM .035X145CM (WIRE) ×3 IMPLANT

## 2020-02-26 NOTE — Op Note (Signed)
[facility]  Percutaneous Study/Intervention Procedural Note   Date of Surgery: 02/26/2020,3:35 PM  Surgeon: Hortencia Pilar  Pre-operative Diagnosis: Steal syndrome secondary to left arm brachial axillary AV access; atherosclerotic occlusive disease of the left upper extremity  Post-operative diagnosis:  Same  Procedure(s) Performed:  1.  Arch aortogram  2.  Left upper extremity angiography third order catheter placement with additional third order catheter placement  3.  Percutaneous transluminal angioplasty of the left radial artery  4.  Star close device right common femoral   Anesthesia: Conscious sedation was administered by the interventional radiology RN under my direct supervision. IV Versed plus fentanyl were utilized. Continuous ECG, pulse oximetry and blood pressure was monitored throughout the entire procedure.  Conscious sedation was administered for a total of 64 minutes.  Sheath: 6 French 90 cm shuttle sheath right common femoral retrograde  Contrast: 50 cc   Fluoroscopy Time: 8.8 minutes  Indications: Patient is noted to have worsening ischemic changes of the left fingers.  Noninvasive studies as well as physical examination support the diagnosis of steal syndrome secondary to her AV access.  Risks and benefits for angiography of been reviewed all questions been answered all are in agreement with proceeding including the patient.  Procedure:  Misty Berry is a 85 y.o. female who was identified and appropriate procedural time out was performed.  The patient was then placed supine on the table and prepped and draped in the usual sterile fashion.    Ultrasound was used to evaluate the right common femoral artery.  It is echolucent and pulsatile indicating it is patent .   A micropuncture needle was used to access the right common femoral artery under direct ultrasound guidance and an image was recorded for the permanent record.  A microwire was  then advanced under fluoroscopic guidance followed by the micro-sheath.  A 0.035 J wire was advanced without resistance and a 5Fr sheath was placed.    Pigtail catheter was then advanced to the level of ascending aorta and and LAO projection of the aortic arch was obtained. The pigtail catheter was exchanged for a H1 catheter. The left subclavian artery was then selected and the catheter and wire advanced. Hand injection of contrast was then used to create images of the subclavian axillary and brachial arteries was obtained.  Diagnostic interpretation: Arch aortogram demonstrates normal arch anatomy no evidence of ostial stenosis of the great vessels magnified imaging of the left subclavian is also obtained demonstrating widely patent origin.  The subclavian axillary and brachial arteries are widely patent.  On injection in the proximal brachial artery virtually all of the contrast flows into the brachial axillary AV graft which is widely patent.  This is consistent with steal syndrome.  Upon manipulation of the KMP catheter distal to the AV graft imaging shows the trifurcation of the forearm is widely patent.  There is a string sign in the radial artery proximally it is the dominant artery to the hand.  Beyond this lesion the artery is small but widely patent filling the palmar arch.  The common ulnar is widely patent as is the interosseous.  The ulnar artery proper demonstrates diffuse narrowing throughout its distal course.  4000 units of heparin was then given and the 5 French sheath was exchanged for a 90 cm 6 [place] shuttle sheath.  Glidewire and catheter were then negotiated distally. And the Glidewire was exchanged for a versa core wire.  The wire and catheter were negotiated into  the radial artery and magnified imaging of the lesion was obtained.  The lesion identified in the radial artery was then treated with a 2 mm x 40 mm Ultraverse balloon. Follow-up imaging demonstrated improvement in this  lesion with less than 15% residual stenosis.   The catheter was then reintroduced over the wire and negotiated into the ulnar artery. Selective injection of the ulnar artery was then performed. This showed diffuse narrowing.  At this point I elected to use nitroglycerin.  300 mcg was injected into the distalmost aspect of the brachial artery the catheter was then advanced into the radial and 800 mcg was injected catheter was then renegotiated into the ulnar artery and 200 mcg was injected.  Follow-up imaging demonstrated significant improvement in both the proximal radial artery as well as the proximal ulnar artery.  After review of the images the catheter was removed over wire and an RAO view of the groin was obtained. StarClose device was deployed without difficulty.  Findings: As noted above the aortic arch subclavian axillary and brachial arteries are widely patent.  The AV graft is widely patent.  There are signs consistent with steal on proximal injection.  Distal to the AV access there is a lesion identified in the radial artery greater than 90% there is diffuse narrowing of the ulnar artery.  Following angioplasty there is now 15% residual stenosis within the radial artery with improved flow to the hand.   Disposition: Patient was taken to the recovery room in stable condition having tolerated the procedure well.  [name] 02/26/2020,3:35 PM

## 2020-02-26 NOTE — Interval H&P Note (Signed)
History and Physical Interval Note:  02/26/2020 2:07 PM  Misty Berry  has presented today for surgery, with the diagnosis of LT Upper Extremity Angiography   Steal Syndrome   IVP Dye Allergy BARD Rep   cc: M Godley, S Willey   Pt to have Covid test on 02-22-20.  The various methods of treatment have been discussed with the patient and family. After consideration of risks, benefits and other options for treatment, the patient has consented to  Procedure(s): UPPER EXTREMITY ANGIOGRAPHY (Left) as a surgical intervention.  The patient's history has been reviewed, patient examined, no change in status, stable for surgery.  I have reviewed the patient's chart and labs.  Questions were answered to the patient's satisfaction.     Hortencia Pilar

## 2020-02-26 NOTE — Discharge Instructions (Signed)
Femoral Site Care This sheet gives you information about how to care for yourself after your procedure. Your health care provider may also give you more specific instructions. If you have problems or questions, contact your health care provider. What can I expect after the procedure? After the procedure, it is common to have:  Bruising that usually fades within 1-2 weeks.  Tenderness at the site. Follow these instructions at home: Wound care  Follow instructions from your health care provider about how to take care of your insertion site. Make sure you: ? Wash your hands with soap and water before you change your bandage (dressing). If soap and water are not available, use hand sanitizer. ? Change your dressing as told by your health care provider. ? Leave stitches (sutures), skin glue, or adhesive strips in place. These skin closures may need to stay in place for 2 weeks or longer. If adhesive strip edges start to loosen and curl up, you may trim the loose edges. Do not remove adhesive strips completely unless your health care provider tells you to do that.  Do not take baths, swim, or use a hot tub until your health care provider approves.  You may shower 24-48 hours after the procedure or as told by your health care provider. ? Gently wash the site with plain soap and water. ? Pat the area dry with a clean towel. ? Do not rub the site. This may cause bleeding.  Do not apply powder or lotion to the site. Keep the site clean and dry.  Check your femoral site every day for signs of infection. Check for: ? Redness, swelling, or pain. ? Fluid or blood. ? Warmth. ? Pus or a bad smell. Activity  For the first 2-3 days after your procedure, or as long as directed: ? Avoid climbing stairs as much as possible. ? Do not squat.  Do not lift anything that is heavier than 10 lb (4.5 kg), or the limit that you are told, until your health care provider says that it is safe.  Rest as  directed. ? Avoid sitting for a long time without moving. Get up to take short walks every 1-2 hours.  Do not drive for 24 hours if you were given a medicine to help you relax (sedative). General instructions  Take over-the-counter and prescription medicines only as told by your health care provider.  Keep all follow-up visits as told by your health care provider. This is important. Contact a health care provider if you have:  A fever or chills.  You have redness, swelling, or pain around your insertion site. Get help right away if:  The catheter insertion area swells very fast.  You pass out.  You suddenly start to sweat or your skin gets clammy.  The catheter insertion area is bleeding, and the bleeding does not stop when you hold steady pressure on the area.  The area near or just beyond the catheter insertion site becomes pale, cool, tingly, or numb. These symptoms may represent a serious problem that is an emergency. Do not wait to see if the symptoms will go away. Get medical help right away. Call your local emergency services (911 in the [place] Do not drive yourself to the hospital. Summary  After the procedure, it is common to have bruising that usually fades within 1-2 weeks.  Check your femoral site every day for signs of infection.  Do not lift anything that is heavier than 10 lb (4.5 kg), or the   limit that you are told, until your health care provider says that it is safe. This information is not intended to replace advice given to you by your health care provider. Make sure you discuss any questions you have with your health care provider. Document Revised: 06/13/2017 Document Reviewed: 06/13/2017 Elsevier Patient Education  2020 Elsevier Inc. Moderate Conscious Sedation, Adult, Care After These instructions provide you with information about caring for yourself after your procedure. Your health care provider may also give you more specific instructions. Your  treatment has been planned according to current medical practices, but problems sometimes occur. Call your health care provider if you have any problems or questions after your procedure. What can I expect after the procedure? After your procedure, it is common:  To feel sleepy for several hours.  To feel clumsy and have poor balance for several hours.  To have poor judgment for several hours.  To vomit if you eat too soon. Follow these instructions at home: For at least 24 hours after the procedure:   Do not: ? Participate in activities where you could fall or become injured. ? Drive. ? Use heavy machinery. ? Drink alcohol. ? Take sleeping pills or medicines that cause drowsiness. ? Make important decisions or sign legal documents. ? Take care of children on your own.  Rest. Eating and drinking  Follow the diet recommended by your health care provider.  If you vomit: ? Drink water, juice, or soup when you can drink without vomiting. ? Make sure you have little or no nausea before eating solid foods. General instructions  Have a responsible adult stay with you until you are awake and alert.  Take over-the-counter and prescription medicines only as told by your health care provider.  If you smoke, do not smoke without supervision.  Keep all follow-up visits as told by your health care provider. This is important. Contact a health care provider if:  You keep feeling nauseous or you keep vomiting.  You feel light-headed.  You develop a rash.  You have a fever. Get help right away if:  You have trouble breathing. This information is not intended to replace advice given to you by your health care provider. Make sure you discuss any questions you have with your health care provider. Document Revised: 05/13/2017 Document Reviewed: 09/20/2015 Elsevier Patient Education  2020 [employer] Inc. Angiogram, Care After This sheet gives you information about how to care for  yourself after your procedure. Your doctor may also give you more specific instructions. If you have problems or questions, contact your doctor. Follow these instructions at home: Insertion site care  Follow instructions from your doctor about how to take care of your long, thin tube (catheter) insertion area. Make sure you: ? Wash your hands with soap and water before you change your bandage (dressing). If you cannot use soap and water, use hand sanitizer. ? Change your bandage as told by your doctor. ? Leave stitches (sutures), skin glue, or skin tape (adhesive) strips in place. They may need to stay in place for 2 weeks or longer. If tape strips get loose and curl up, you may trim the loose edges. Do not remove tape strips completely unless your doctor says it is okay.  Do not take baths, swim, or use a hot tub until your doctor says it is okay.  You may shower 24-48 hours after the procedure or as told by your doctor. ? Gently wash the area with plain soap and water. ?   Pat the area dry with a clean towel. ? Do not rub the area. This may cause bleeding.  Do not apply powder or lotion to the area. Keep the area clean and dry.  Check your insertion area every day for signs of infection. Check for: ? More redness, swelling, or pain. ? Fluid or blood. ? Warmth. ? Pus or a bad smell. Activity  Rest as told by your doctor, usually for 1-2 days.  Do not lift anything that is heavier than 10 lbs. (4.5 kg) or as told by your doctor.  Do not drive for 24 hours if you were given a medicine to help you relax (sedative).  Do not drive or use heavy machinery while taking prescription pain medicine. General instructions   Go back to your normal activities as told by your doctor, usually in about a week. Ask your doctor what activities are safe for you.  If the insertion area starts to bleed, lie flat and put pressure on the area. If the bleeding does not stop, get help right away. This is an  emergency.  Drink enough fluid to keep your pee (urine) clear or pale yellow.  Take over-the-counter and prescription medicines only as told by your doctor.  Keep all follow-up visits as told by your doctor. This is important. Contact a doctor if:  You have a fever.  You have chills.  You have more redness, swelling, or pain around your insertion area.  You have fluid or blood coming from your insertion area.  The insertion area feels warm to the touch.  You have pus or a bad smell coming from your insertion area.  You have more bruising around the insertion area.  Blood collects in the tissue around the insertion area (hematoma) that may be painful to the touch. Get help right away if:  You have a lot of pain in the insertion area.  The insertion area swells very fast.  The insertion area is bleeding, and the bleeding does not stop after holding steady pressure on the area.  The area near or just beyond the insertion area becomes pale, cool, tingly, or numb. These symptoms may be an emergency. Do not wait to see if the symptoms will go away. Get medical help right away. Call your local emergency services (911 in the [place] Do not drive yourself to the hospital. Summary  After the procedure, it is common to have bruising and tenderness at the long, thin tube insertion area.  After the procedure, it is important to rest and drink plenty of fluids.  Do not take baths, swim, or use a hot tub until your doctor says it is okay to do so. You may shower 24-48 hours after the procedure or as told by your doctor.  If the insertion area starts to bleed, lie flat and put pressure on the area. If the bleeding does not stop, get help right away. This is an emergency. This information is not intended to replace advice given to you by your health care provider. Make sure you discuss any questions you have with your health care provider. Document Revised: 05/13/2017 Document Reviewed:  05/25/2016 Elsevier Patient Education  2020 Elsevier Inc.  

## 2020-02-27 ENCOUNTER — Encounter: Payer: Self-pay | Admitting: Vascular Surgery

## 2020-02-28 ENCOUNTER — Encounter (INDEPENDENT_AMBULATORY_CARE_PROVIDER_SITE_OTHER): Payer: Medicare Other

## 2020-02-28 ENCOUNTER — Ambulatory Visit (INDEPENDENT_AMBULATORY_CARE_PROVIDER_SITE_OTHER): Payer: Medicare Other | Admitting: Nurse Practitioner

## 2020-03-06 ENCOUNTER — Ambulatory Visit (INDEPENDENT_AMBULATORY_CARE_PROVIDER_SITE_OTHER): Payer: Medicare Other | Admitting: Vascular Surgery

## 2020-03-14 ENCOUNTER — Telehealth (INDEPENDENT_AMBULATORY_CARE_PROVIDER_SITE_OTHER): Payer: Self-pay

## 2020-03-14 NOTE — Telephone Encounter (Signed)
Patient's brother called wanting to know the next steps for the patient. I explained the patient has an appt with Dr. Delana Meyer on 03/20/20 @ 10:15 am and during that appt it will be discussed what is next for the patient. He stated she has dementia and needs someone with her (patient resides at [facility] assisted living) and he would like to be a part of the conversation. I advised he have his number sent with the patient and we can try to get him on the phone during the visit.

## 2020-03-20 ENCOUNTER — Other Ambulatory Visit: Payer: Self-pay

## 2020-03-20 ENCOUNTER — Encounter (INDEPENDENT_AMBULATORY_CARE_PROVIDER_SITE_OTHER): Payer: Self-pay | Admitting: Vascular Surgery

## 2020-03-20 ENCOUNTER — Ambulatory Visit (INDEPENDENT_AMBULATORY_CARE_PROVIDER_SITE_OTHER): Payer: Medicare Other | Admitting: Vascular Surgery

## 2020-03-20 VITALS — BP 110/57 | HR 68 | Ht 62.0 in | Wt 186.0 lb

## 2020-03-20 DIAGNOSIS — I10 Essential (primary) hypertension: Secondary | ICD-10-CM

## 2020-03-20 DIAGNOSIS — N186 End stage renal disease: Secondary | ICD-10-CM | POA: Diagnosis not present

## 2020-03-20 DIAGNOSIS — T829XXS Unspecified complication of cardiac and vascular prosthetic device, implant and graft, sequela: Secondary | ICD-10-CM | POA: Diagnosis not present

## 2020-03-20 DIAGNOSIS — I5040 Unspecified combined systolic (congestive) and diastolic (congestive) heart failure: Secondary | ICD-10-CM

## 2020-03-20 DIAGNOSIS — E782 Mixed hyperlipidemia: Secondary | ICD-10-CM

## 2020-03-20 DIAGNOSIS — I70229 Atherosclerosis of native arteries of extremities with rest pain, unspecified extremity: Secondary | ICD-10-CM | POA: Insufficient documentation

## 2020-03-20 DIAGNOSIS — I890 Lymphedema, not elsewhere classified: Secondary | ICD-10-CM

## 2020-03-20 DIAGNOSIS — T829XXA Unspecified complication of cardiac and vascular prosthetic device, implant and graft, initial encounter: Secondary | ICD-10-CM | POA: Insufficient documentation

## 2020-03-20 NOTE — Progress Notes (Signed)
MRN : [id number]  Misty Berry is a 85 y.o. (1935/05/26) female who presents with chief complaint of  Chief Complaint  Patient presents with  . Follow-up    2 week ue angio. no studies   .  History of Present Illness:   The patient returns to the office for followup and review status post angiogram with intervention 02/26/2020.   As noted above the aortic arch subclavian axillary and brachial arteries are widely patent.  The AV graft is widely patent.  There are signs consistent with steal on proximal injection.  Distal to the AV access there is a lesion identified in the radial artery greater than 90% there is diffuse narrowing of the ulnar artery.  Following angioplasty there is now 15% residual stenosis within the radial artery with improved flow to the hand.  The patient notes improvement in the left uppper extremity symptoms. No interval shortening of the patient's hand claudication distance or rest pain symptoms.  Today she notes her hand feels fine.  The patient is also seen regarding leg swelling.  The swelling has persisted and the pain associated with swelling continues. There have been interval development of a ulcerations or wounds mostly from the patient scratching.  Since the previous visit the patient has not been wearing graduated compression stockings.  The patient also states elevation during the day and exercise is being done too.  There have been no significant changes to the patient's overall health care.  The patient denies amaurosis fugax or recent TIA symptoms. There are no recent neurological changes noted. The patient denies history of DVT, PE or superficial thrombophlebitis. The patient denies recent episodes of angina or shortness of breath.     No outpatient medications have been marked as taking for the 03/20/20 encounter (Office Visit) with Delana Meyer, Dolores Lory, MD.    Past Medical History:  Diagnosis Date  . Asthma   . Bruises easily   .  Cellulitis   . CHF (congestive heart failure) ([place])   . Cough   . DDD (degenerative disc disease), lumbar   . GERD (gastroesophageal reflux disease)   . HBP (high blood pressure)   . Hypercholesterolemia   . Hyperparathyroidism ([place])   . Osteoarthritis   . Osteoporosis   . Rash   . Renal disorder    kidney disease  . Swelling     Past Surgical History:  Procedure Laterality Date  . APPENDECTOMY    . AV FISTULA PLACEMENT Left 12/12/2019   Procedure: INSERTION OF ARTERIOVENOUS (AV) GORE-TEX GRAFT ARM ( BRACHIAL AXILLARY );  Surgeon: Katha Cabal, MD;  Location: [facility] ORS;  Service: Vascular;  Laterality: Left;  . BREAST REDUCTION SURGERY    . CHOLECYSTECTOMY    . DIALYSIS/PERMA CATHETER INSERTION N/A 10/02/2019   Procedure: DIALYSIS/PERMA CATHETER INSERTION;  Surgeon: Katha Cabal, MD;  Location: [place] [facility];  Service: Cardiovascular;  Laterality: N/A;  . EYE SURGERY Left   . FEMUR SURGERY    . FOOT SURGERY Right   . GALLBLADDER SURGERY    . LIPECTOMY    . PAROTID GLAND TUMOR EXCISION    . PERIPHERAL VASCULAR THROMBECTOMY Left 01/16/2020   Procedure: PERIPHERAL VASCULAR THROMBECTOMY;  Surgeon: Katha Cabal, MD;  Location: [place] [facility];  Service: Cardiovascular;  Laterality: Left;  . THYROID SURGERY    . TONSILLECTOMY    . TUBAL LIGATION    . UPPER EXTREMITY ANGIOGRAPHY Left 02/26/2020   Procedure: UPPER EXTREMITY ANGIOGRAPHY;  Surgeon: Katha Cabal, MD;  Location: [place] [facility];  Service: Cardiovascular;  Laterality: Left;  Marland Kitchen VITRECTOMY      Social History Social History   Tobacco Use  . Smoking status: Never Smoker  . Smokeless tobacco: Never Used  Vaping Use  . Vaping Use: Never used  Substance Use Topics  . Alcohol use: No  . Drug use: No    Family History No family history on file.  Allergies  Allergen Reactions  . Ivp Dye [Iodinated Diagnostic Agents] Other (See Comments)  . Lactose     unknown  .  Methylprednisolone Other (See Comments)    Reaction: Hallucinations and Psychosis  . Peanuts [Peanut Oil] Other (See Comments)    Swelling and vomitting  . Vancomycin Other (See Comments)    Reaction: unknown  . Wellbutrin [Bupropion] Other (See Comments)    Hallucinations     REVIEW OF SYSTEMS (Negative unless checked)  Constitutional: [] Weight loss  [] Fever  [] Chills Cardiac: [] Chest pain   [] Chest pressure   [] Palpitations   [] Shortness of breath when laying flat   [] Shortness of breath with exertion. Vascular:  [] Pain in legs with walking   [] Pain in legs at rest  [] History of DVT   [] Phlebitis   [x] Swelling in legs   [] Varicose veins   [x] Non-healing ulcers Pulmonary:   [] Uses home oxygen   [] Productive cough   [] Hemoptysis   [] Wheeze  [] COPD   [] Asthma Neurologic:  [] Dizziness   [] Seizures   [] History of stroke   [] History of TIA  [] Aphasia   [] Vissual changes   [] Weakness or numbness in arm   [] Weakness or numbness in leg Musculoskeletal:   [] Joint swelling   [x] Joint pain   [x] Low back pain Hematologic:  [] Easy bruising  [] Easy bleeding   [] Hypercoagulable state   [] Anemic Gastrointestinal:  [] Diarrhea   [] Vomiting  [] Gastroesophageal reflux/heartburn   [] Difficulty swallowing. Genitourinary:  [x] Chronic kidney disease   [] Difficult urination  [] Frequent urination   [] Blood in urine Skin:  [x] Rashes   [x] Ulcers  Psychological:  [] History of anxiety   []  History of major depression.  Physical Examination  Vitals:   03/20/20 1016  BP: (!) 110/57  Pulse: 68  Weight: 186 lb (84.4 kg)  Height: 5\' 2"  (1.575 m)   Body mass index is 34.02 kg/m. Gen: WD/WN, NAD Head: [place]/AT, No temporalis wasting.  Ear/Nose/Throat: Hearing grossly intact, nares w/o erythema or drainage Eyes: PER, EOMI, sclera nonicteric.  Neck: Supple, no large masses.   Pulmonary:  Good air movement, no audible wheezing bilaterally, no use of accessory muscles.  Cardiac: RRR, no JVD Vascular: left brachial  axillary graft good thrill good bruit.  2-3+ edema bilaterally with severe venous changes bilaterally.  Venous ulcers noted in the ankle area bilaterally, noninfected Vessel Right Left  Radial Palpable Palpable  Brachial Palpable Palpable  Gastrointestinal: Non-distended. No guarding/no peritoneal signs.  Musculoskeletal: M/S 5/5 throughout.  No deformity or atrophy.  Neurologic: CN 2-12 intact. Symmetrical.  Speech is fluent. Motor exam as listed above. Psychiatric: Judgment intact, Mood & affect appropriate for pt's clinical situation. Dermatologic: Venous stasis dermatitis with ulcers present.  No changes consistent with cellulitis.   CBC Lab Results  Component Value Date   WBC 7.6 01/18/2020   HGB 10.6 (L) 01/18/2020   HCT 34.6 (L) 01/18/2020   MCV 90.8 01/18/2020   PLT 200 01/18/2020    BMET    Component Value Date/Time   NA 137 01/18/2020 2208   NA 141  04/23/2013 0534   K 4.2 01/18/2020 2208   K 3.9 04/23/2013 0534   CL 98 01/18/2020 2208   CL 112 (H) 04/23/2013 0534   CO2 27 01/18/2020 2208   CO2 23 04/23/2013 0534   GLUCOSE 93 01/18/2020 2208   GLUCOSE 92 04/23/2013 0534   BUN 11 01/18/2020 2208   BUN 31 (H) 04/23/2013 0534   CREATININE 2.08 (H) 01/18/2020 2208   CREATININE 1.78 (H) 04/23/2013 0534   CALCIUM 8.4 (L) 01/18/2020 2208   CALCIUM 8.6 04/23/2013 0534   GFRNONAA 22 (L) 01/18/2020 2208   GFRNONAA 27 (L) 04/23/2013 0534   GFRAA 25 (L) 01/18/2020 2208   GFRAA 32 (L) 04/23/2013 0534   CrCl cannot be calculated (Patient's most recent lab result is older than the maximum 21 days allowed.).  COAG Lab Results  Component Value Date   INR 0.9 01/18/2020   INR 1.0 12/10/2019   INR 1.0 02/04/2013    Radiology PERIPHERAL VASCULAR CATHETERIZATION  Result Date: 02/26/2020 See Op Note    Assessment/Plan 1. Complication of vascular access for dialysis, sequela Recommend:  The patient is doing well and currently has adequate dialysis access. The  patient's dialysis center is not reporting any access issues.  Okay to begin cannulation of the left brachial axillary AV graft.  Graft has a good thrill good bruit and there is now a palpable radial pulse she is no longer complaining of hand pain or steal syndrome.  The patient should have a duplex ultrasound of the dialysis access in 6 months. The patient will follow-up with me in the office after each ultrasound    - VAS [place] (AVF, AVG); Future  2. Lymphedema No surgery or intervention at this point in time.    I have reviewed my discussion with the patient regarding venous insufficiency and secondary lymph edema and why it  causes symptoms. I have discussed with the patient the chronic skin changes that accompany these problems and the long term sequela such as ulceration and infection.  Patient is encouraged to obtain compression wraps.  At this time the patient has been reluctant to do so.  I did also offer Jobst sensory foot as an interim solution since these are very mild they would not be adequate as the primary treatment but if this is all we can encourage the patient to utilize that the best we can do. The patient will  put the stockings on first thing in the morning and removing them in the evening. The patient is instructed specifically not to sleep in the stockings.  In addition, behavioral modification including elevation during the day will be continued.  Diet and salt restriction was also discussed.  Previous duplex ultrasound of the lower extremities shows normal deep venous system, superficial reflux was not present.   Following the review of the ultrasound the patient will follow up in 12 months to reassess the degree of swelling and the control that graduated compression is offering.   The patient can be assessed for a Lymph Pump at that time.  However, at this time the patient states they are satisfied with the control compression and elevation is yielding.     3. End stage renal disease ([place]) Patient is encouraged to attend her scheduled dialysis runs.  At the present time she is only agreeing to undergo dialysis once a week  4. Mixed hyperlipidemia Continue statin as ordered and reviewed, no changes at this time   5. Essential hypertension Continue  antihypertensive medications as already ordered, these medications have been reviewed and there are no changes at this time.   6. Combined systolic and diastolic congestive heart failure, unspecified HF chronicity (HCC) Continue cardiac and antihypertensive medications as already ordered and reviewed, no changes at this time.  Continue statin as ordered and reviewed, no changes at this time  Nitrates PRN for chest pain     Hortencia Pilar, MD  03/20/2020 10:18 AM

## 2020-03-25 ENCOUNTER — Other Ambulatory Visit: Payer: Self-pay

## 2020-03-25 ENCOUNTER — Non-Acute Institutional Stay: Payer: Medicare Other | Admitting: Adult Health Nurse Practitioner

## 2020-03-25 DIAGNOSIS — Z515 Encounter for palliative care: Secondary | ICD-10-CM

## 2020-03-25 DIAGNOSIS — I5040 Unspecified combined systolic (congestive) and diastolic (congestive) heart failure: Secondary | ICD-10-CM

## 2020-03-25 NOTE — Progress Notes (Signed)
[facility] Consult Note Telephone: [phone]  Fax: [phone]  PATIENT NAME: Misty Berry DOB: June 21, 1934 MRN: [id number]  PRIMARY CARE PROVIDER:   Franciso Bend, MD  REFERRING PROVIDER:  Franciso Bend, MD [address] [place],  [place] [address]  RESPONSIBLE PARTY:   Beckie Salts brother [phone]       RECOMMENDATIONS and PLAN:  1.  Advanced care planning. Patient is a DNR.  Will call brother to update on visit  2.  Dementia.  Patient is able to walk short distances with a walker but otherwise uses a wheelchair.  Requires assistance with ADLs but is able to feed herself.  No reported falls.  Appetite is good with no reported weight loss. Patient is able to hold a conversation and make her needs known.  She is alert and oriented to person and place.  Continue supportive care at facility.  Patient has no new concerns today.  Palliative will continue to monitor for symptom management/decline and make recommendations as needed.  We will follow up in 4 to 6 weeks  I spent 30 minutes providing this consultation,  From 11:20 to 11:50 including time spent with patient/family, chart review, provider coordination, documentation. More than 50% of the time in this consultation was spent coordinating communication.   HISTORY OF PRESENT ILLNESS:  Misty Berry is a 85 y.o. year old female with multiple medical problems including ESRD on HD, CHF, dementia, asthma asthma, DDD of lumbar spine, HTN, GERD, OA, hyperparathyroidism. Palliative Care was asked to help address goals of care. Palliative Care was asked to help address goals of care.  Patient had to have AV shunt redone on 01/16/2020.  On 01/18/2020 was seen in the ED for hypotension and was found to have UTI and started on Keflex.  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  Patient denies any pain,  numbness, tingling in hands or feet.  Denies S OB, cough, headaches, dizziness, N/V/D, constipation, fever, dysuria, hematuria, chest pain, palpitations.  CODE STATUS: DNR  PPS: 40% HOSPICE ELIGIBILITY/DIAGNOSIS: TBD  PHYSICAL EXAM: HR 80 O2 92% on room air General: NAD, frail appearing Cardiovascular: regular rate and rhythm Pulmonary: Lung sounds clear; normal respiratory effort Abdomen: soft, nontender, + bowel sounds GU: no suprapubic tenderness Extremities:1-2+ edema noted to bilateral lower extremities, no weeping noted, no joint deformities Skin: No rashes on exposed skin Neurological: Weakness;alert and oriented to person and place  PAST MEDICAL HISTORY:  Past Medical History:  Diagnosis Date   Asthma    Bruises easily    Cellulitis    CHF (congestive heart failure) (HCC)    Cough    DDD (degenerative disc disease), lumbar    GERD (gastroesophageal reflux disease)    HBP (high blood pressure)    Hypercholesterolemia    Hyperparathyroidism (HCC)    Osteoarthritis    Osteoporosis    Rash    Renal disorder    kidney disease   Swelling     SOCIAL HX:  Social History   Tobacco Use   Smoking status: Never Smoker   Smokeless tobacco: Never Used  Substance Use Topics   Alcohol use: No    ALLERGIES:  Allergies  Allergen Reactions   Ivp Dye [Iodinated Diagnostic Agents] Other (See Comments)   Lactose     unknown   Methylprednisolone Other (See Comments)    Reaction: Hallucinations and  Psychosis   Peanuts [Peanut Oil] Other (See Comments)    Swelling and vomitting   Vancomycin Other (See Comments)    Reaction: unknown   Wellbutrin [Bupropion] Other (See Comments)    Hallucinations     PERTINENT MEDICATIONS:  Outpatient Encounter Medications as of 03/25/2020  Medication Sig   acidophilus (RISAQUAD) CAPS capsule Take 2 capsules by mouth 3 (three) times daily.   aspirin 81 MG chewable tablet Chew 81 mg by mouth daily. (0800)     bumetanide (BUMEX) 1 MG tablet Take 1 tablet (1 mg total) by mouth daily. (Patient taking differently: Take 1 mg by mouth daily. Hold for SBP less than 110)   busPIRone (BUSPAR) 5 MG tablet Take 5 mg by mouth daily. (0800)   calcium-vitamin D (OSCAL WITH D) 500-200 MG-UNIT tablet Take 1 tablet by mouth daily.   clopidogrel (PLAVIX) 75 MG tablet Take 1 tablet (75 mg total) by mouth daily.   docusate sodium (COLACE) 100 MG capsule Take 1 capsule (100 mg total) by mouth 2 (two) times daily.   Ergocalciferol (VITAMIN D2) 50 MCG (2000 UT) TABS Take 1 tablet by mouth daily.    esomeprazole (NEXIUM) 40 MG capsule Take 40 mg by mouth daily. (0800)   ferrous gluconate (FERGON) 324 MG tablet Take 324 mg by mouth daily.   losartan (COZAAR) 50 MG tablet Take 1 tablet (50 mg total) by mouth daily. (Patient taking differently: Take 50 mg by mouth 4 (four) times a week. (0800) on Mon, Wed, Fri, and Sun)   metoprolol tartrate (LOPRESSOR) 25 MG tablet Take 25 mg by mouth 4 (four) times a week. on Mon, Wed, Fri, and Sun. Hold for SBP less than 110   Multiple Vitamins-Minerals (CENTRUM SILVER PO) Take 1 tablet by mouth daily.   Omega-3 Fatty Acids (FISH OIL) 1000 MG CAPS Take 4,000 mg by mouth daily.    sertraline (ZOLOFT) 100 MG tablet Take 100 mg by mouth every morning. 0800   sevelamer carbonate (RENVELA) 800 MG tablet Take 1,600 mg by mouth 3 (three) times daily with meals.   Skin Protectants, Misc. (BAZA PROTECT EX) Apply 1 application topically every 4 (four) hours.    traMADol (ULTRAM) 50 MG tablet Take 1 tablet (50 mg total) by mouth every 6 (six) hours as needed for moderate pain.   No facility-administered encounter medications on file as of 03/25/2020.     Chasitie Passey Jenetta Downer, NP

## 2020-04-25 ENCOUNTER — Telehealth (INDEPENDENT_AMBULATORY_CARE_PROVIDER_SITE_OTHER): Payer: Self-pay

## 2020-04-25 NOTE — Telephone Encounter (Signed)
A fax was received from [facility] for the patient to have a permcath removal scheduled and fax the appt back. Patient is scheduled with Dr. Lucky Cowboy on 05/01/20 with a 11:15 am arrival time to the [facility]. Covid testing on 04/29/20 between 8-1 pm at the [facility]. Pre-procedure instructions will be faxed to [facility].

## 2020-04-29 ENCOUNTER — Other Ambulatory Visit: Payer: Medicare Other | Attending: Vascular Surgery

## 2020-04-30 ENCOUNTER — Other Ambulatory Visit: Payer: Self-pay

## 2020-04-30 ENCOUNTER — Non-Acute Institutional Stay: Payer: Medicare Other | Admitting: Adult Health Nurse Practitioner

## 2020-04-30 DIAGNOSIS — I5040 Unspecified combined systolic (congestive) and diastolic (congestive) heart failure: Secondary | ICD-10-CM

## 2020-04-30 DIAGNOSIS — Z515 Encounter for palliative care: Secondary | ICD-10-CM

## 2020-04-30 DIAGNOSIS — N186 End stage renal disease: Secondary | ICD-10-CM

## 2020-04-30 NOTE — Progress Notes (Signed)
[facility] Consult Note Telephone: [phone]  Fax: [phone]  PATIENT NAME: Misty Berry DOB: 26-Jul-1934 MRN: [id number]  PRIMARY CARE PROVIDER:   Franciso Bend, MD  REFERRING PROVIDER:  Franciso Bend, MD [address] [place],  [place] [address]  RESPONSIBLE PARTY:   Beckie Salts brother [phone]    RECOMMENDATIONS and PLAN:  1.  Advanced care planning.  Patient is DNR.  2.  Pain.  Patient does complain today of pain in her knees.  This pain comes and goes and she describes it as sharp.  She does not have this pain very often and gets some relief with current pain regimen.  Continue with current pain regimen  3.  Dementia. Patient is able to walk short distances with a walker but otherwise uses a wheelchair. Requires assistance with ADLs but is able to feed herself. No reported falls. Appetite is good with no reported weight loss. Patient is able to hold a conversation and make her needs known. She is alert and oriented to person and place. Continue supportive care at facility.  4.  Goals of care.  Spoke with brother via phone today.  Also spoke with staff at the facility.  Dr. Smith Mince, nephrologist, would like to have a joint meeting on goals of care as patient has been missing several dialysis treatments.  Brother states that even though she does not remember it she has been averaging about 1 dialysis treatment a week for about a month now.  Staff at facility states that they will forward me information of date and time of this meeting.  I am happy to attend this meeting to help facilitate goals of care and discussion about continuing versus stopping dialysis.  Palliative will continue to monitor for symptom management/decline and make recommendations as needed.  We will follow up in 10 to 12 weeks.  I spent 30 minutes providing this consultation,  from 10:45 to 11:15 including time spent with patient/family, chart  review, provider coordination, documentation. More than 50% of the time in this consultation was spent coordinating communication.   HISTORY OF PRESENT ILLNESS:  Misty Berry is a 85 y.o. year old female with multiple medical problems including ESRD on HD, CHF, dementia, asthma asthma, DDD of lumbar spine, HTN, GERD, OA, hyperparathyroidism. Palliative Care was asked to help address goals of care.  Patient does have increased edema in bilateral legs.  She has had 1 missed dialysis treatment.  She is preparing to go to dialysis this afternoon.  Patient does complain of pain today, see above.  Denies headache, dizziness, increased shortness of breath or cough, N/V/D, constipation, dysuria, hematuria.   CODE STATUS: DNR  PPS: 40% HOSPICE ELIGIBILITY/DIAGNOSIS: TBD  PHYSICAL EXAM:  HR 78 O2 97% on room air General: NAD, frail appearing Cardiovascular: regular rate and rhythm Pulmonary: Lung sounds clear; normal respiratory effort Abdomen: soft, nontender, + bowel sounds GU: no suprapubic tenderness Extremities:2-3+edema noted to bilateral lower extremities, no weeping noted, no joint deformities Skin:  Has several scratches noted to bilateral lower extremities where she scratches Neurological: Weakness;alert and oriented to person and place  PAST MEDICAL HISTORY:  Past Medical History:  Diagnosis Date  . Asthma   . Bruises easily   . Cellulitis   . CHF (congestive heart failure) ([facility])   . Cough   . DDD (degenerative disc disease), lumbar   . GERD (gastroesophageal reflux disease)   . HBP (high blood pressure)   .  Hypercholesterolemia   . Hyperparathyroidism ([facility])   . Osteoarthritis   . Osteoporosis   . Rash   . Renal disorder    kidney disease  . Swelling     SOCIAL HX:  Social History   Tobacco Use  . Smoking status: Never Smoker  . Smokeless tobacco: Never Used  Substance Use Topics  . Alcohol use: No    ALLERGIES:  Allergies  Allergen Reactions  . Ivp Dye  [Iodinated Diagnostic Agents] Other (See Comments)  . Lactose     unknown  . Methylprednisolone Other (See Comments)    Reaction: Hallucinations and Psychosis  . Peanuts [Peanut Oil] Other (See Comments)    Swelling and vomitting  . Vancomycin Other (See Comments)    Reaction: unknown  . Wellbutrin [Bupropion] Other (See Comments)    Hallucinations     PERTINENT MEDICATIONS:  Outpatient Encounter Medications as of 04/30/2020  Medication Sig  . acidophilus (RISAQUAD) CAPS capsule Take 2 capsules by mouth 3 (three) times daily.  Marland Kitchen aspirin 81 MG chewable tablet Chew 81 mg by mouth daily. (0800)  . bumetanide (BUMEX) 1 MG tablet Take 1 tablet (1 mg total) by mouth daily. (Patient taking differently: Take 1 mg by mouth daily. Hold for SBP less than 110)  . busPIRone (BUSPAR) 5 MG tablet Take 5 mg by mouth daily. (0800)  . calcium-vitamin D (OSCAL WITH D) 500-200 MG-UNIT tablet Take 1 tablet by mouth daily.  . clopidogrel (PLAVIX) 75 MG tablet Take 1 tablet (75 mg total) by mouth daily.  Marland Kitchen docusate sodium (COLACE) 100 MG capsule Take 1 capsule (100 mg total) by mouth 2 (two) times daily.  . Ergocalciferol (VITAMIN D2) 50 MCG (2000 UT) TABS Take 1 tablet by mouth daily.   Marland Kitchen esomeprazole (NEXIUM) 40 MG capsule Take 40 mg by mouth daily. (0800)  . ferrous gluconate (FERGON) 324 MG tablet Take 324 mg by mouth daily.  Marland Kitchen losartan (COZAAR) 50 MG tablet Take 1 tablet (50 mg total) by mouth daily. (Patient taking differently: Take 50 mg by mouth 4 (four) times a week. (0800) on Mon, Wed, Fri, and Sun)  . metoprolol tartrate (LOPRESSOR) 25 MG tablet Take 25 mg by mouth 4 (four) times a week. on Mon, Wed, Fri, and Sun. Hold for SBP less than 110  . Multiple Vitamins-Minerals (CENTRUM SILVER PO) Take 1 tablet by mouth daily.  . Omega-3 Fatty Acids (FISH OIL) 1000 MG CAPS Take 4,000 mg by mouth daily.   . sertraline (ZOLOFT) 100 MG tablet Take 100 mg by mouth every morning. 0800  . sevelamer carbonate  (RENVELA) 800 MG tablet Take 1,600 mg by mouth 3 (three) times daily with meals.  . Skin Protectants, Misc. (BAZA PROTECT EX) Apply 1 application topically every 4 (four) hours.   . traMADol (ULTRAM) 50 MG tablet Take 1 tablet (50 mg total) by mouth every 6 (six) hours as needed for moderate pain.   No facility-administered encounter medications on file as of 04/30/2020.      Jadene Stemmer Jenetta Downer, NP

## 2020-05-01 ENCOUNTER — Other Ambulatory Visit (INDEPENDENT_AMBULATORY_CARE_PROVIDER_SITE_OTHER): Payer: Self-pay | Admitting: Nurse Practitioner

## 2020-05-01 ENCOUNTER — Telehealth: Payer: Self-pay | Admitting: Adult Health Nurse Practitioner

## 2020-05-01 NOTE — Telephone Encounter (Signed)
Patient has been rescheduled with Dr. Delana Meyer for 05/06/20 with a 11:15 am arrival time to the [facility]. Covid testing on 05/02/20 between 8-1 pm at the [facility]. Pre-procedure instructions were faxed to [facility].

## 2020-05-01 NOTE — Telephone Encounter (Signed)
Spoke with patient's brother to update on yesterday's visit.  Encouraged to call with any further questions or concerns Briarrose Shor K. Olena Heckle NP

## 2020-05-02 ENCOUNTER — Other Ambulatory Visit: Payer: Medicare Other

## 2020-05-05 ENCOUNTER — Other Ambulatory Visit: Payer: Self-pay

## 2020-05-05 ENCOUNTER — Other Ambulatory Visit
Admission: RE | Admit: 2020-05-05 | Discharge: 2020-05-05 | Disposition: A | Discharge status: Home / Self Care | Payer: Medicare Other | Source: Ambulatory Visit | Attending: Vascular Surgery | Admitting: Vascular Surgery

## 2020-05-05 DIAGNOSIS — Z01812 Encounter for preprocedural laboratory examination: Secondary | ICD-10-CM | POA: Diagnosis present

## 2020-05-05 DIAGNOSIS — Z20822 Contact with and (suspected) exposure to covid-19: Secondary | ICD-10-CM | POA: Diagnosis not present

## 2020-05-05 LAB — SARS CORONAVIRUS 2 (TAT 6-24 HRS): SARS Coronavirus 2: NEGATIVE

## 2020-05-06 ENCOUNTER — Ambulatory Visit
Admission: RE | Admit: 2020-05-06 | Discharge: 2020-05-06 | Disposition: A | Discharge status: Home / Self Care | Payer: Medicare Other | Attending: Vascular Surgery | Admitting: Vascular Surgery

## 2020-05-06 ENCOUNTER — Encounter: Payer: Self-pay | Admitting: Vascular Surgery

## 2020-05-06 ENCOUNTER — Other Ambulatory Visit: Payer: Self-pay

## 2020-05-06 ENCOUNTER — Encounter
Admission: RE | Disposition: A | Discharge status: Home / Self Care | Payer: Self-pay | Source: Home / Self Care | Attending: Vascular Surgery

## 2020-05-06 DIAGNOSIS — Z888 Allergy status to other drugs, medicaments and biological substances status: Secondary | ICD-10-CM | POA: Diagnosis not present

## 2020-05-06 DIAGNOSIS — Z91041 Radiographic dye allergy status: Secondary | ICD-10-CM | POA: Insufficient documentation

## 2020-05-06 DIAGNOSIS — I2510 Atherosclerotic heart disease of native coronary artery without angina pectoris: Secondary | ICD-10-CM | POA: Insufficient documentation

## 2020-05-06 DIAGNOSIS — Z992 Dependence on renal dialysis: Secondary | ICD-10-CM | POA: Diagnosis not present

## 2020-05-06 DIAGNOSIS — Z91010 Allergy to peanuts: Secondary | ICD-10-CM | POA: Insufficient documentation

## 2020-05-06 DIAGNOSIS — I120 Hypertensive chronic kidney disease with stage 5 chronic kidney disease or end stage renal disease: Secondary | ICD-10-CM | POA: Insufficient documentation

## 2020-05-06 DIAGNOSIS — T82898A Other specified complication of vascular prosthetic devices, implants and grafts, initial encounter: Secondary | ICD-10-CM | POA: Diagnosis not present

## 2020-05-06 DIAGNOSIS — Z881 Allergy status to other antibiotic agents status: Secondary | ICD-10-CM | POA: Insufficient documentation

## 2020-05-06 DIAGNOSIS — N186 End stage renal disease: Secondary | ICD-10-CM | POA: Insufficient documentation

## 2020-05-06 DIAGNOSIS — E1122 Type 2 diabetes mellitus with diabetic chronic kidney disease: Secondary | ICD-10-CM | POA: Insufficient documentation

## 2020-05-06 DIAGNOSIS — Z4901 Encounter for fitting and adjustment of extracorporeal dialysis catheter: Secondary | ICD-10-CM | POA: Diagnosis not present

## 2020-05-06 HISTORY — PX: DIALYSIS/PERMA CATHETER REMOVAL: CATH118289

## 2020-05-06 SURGERY — DIALYSIS/PERMA CATHETER REMOVAL
Anesthesia: LOCAL

## 2020-05-06 MED ORDER — LIDOCAINE-EPINEPHRINE (PF) 1 %-1:200000 IJ SOLN
INTRAMUSCULAR | Status: DC | PRN
  Administered 2020-05-06: 20 mL via INTRADERMAL

## 2020-05-06 SURGICAL SUPPLY — 4 items
CHLORAPREP W/TINT 26 (MISCELLANEOUS) ×4 IMPLANT
FORCEPS HALSTEAD CVD 5IN STRL (INSTRUMENTS) ×2 IMPLANT
TOWEL OR 17X26 4PK STRL BLUE (TOWEL DISPOSABLE) ×2 IMPLANT
TRAY LACERAT/PLASTIC (MISCELLANEOUS) ×2 IMPLANT

## 2020-05-06 NOTE — Interval H&P Note (Signed)
History and Physical Interval Note:  05/06/2020 12:35 PM  Misty Berry  has presented today for surgery, with the diagnosis of Perm Cath Removal   ESRD   Pt to have Covid test on 04-29-20.  The various methods of treatment have been discussed with the patient and family. After consideration of risks, benefits and other options for treatment, the patient has consented to  Procedure(s): DIALYSIS/PERMA CATHETER REMOVAL (N/A) as a surgical intervention.  The patient's history has been reviewed, patient examined, no change in status, stable for surgery.  I have reviewed the patient's chart and labs.  Questions were answered to the patient's satisfaction.     Hortencia Pilar

## 2020-05-06 NOTE — Discharge Instructions (Signed)
Tunneled Catheter Removal, Care After Refer to this sheet in the next few weeks. These instructions provide you with information about caring for yourself after your procedure. Your health care provider may also give you more specific instructions. Your treatment has been planned according to current medical practices, but problems sometimes occur. Call your health care provider if you have any problems or questions after your procedure. What can I expect after the procedure? After the procedure, it is common to have: Some mild redness, swelling, and pain around your catheter site.   Follow these instructions at home: Incision care  Check your removal site  every day for signs of infection. Check for: More redness, swelling, or pain. More fluid or blood. Warmth. Pus or a bad smell. Remove your dressing in 48hrs leave open to air  Activity  Return to your normal activities as told by your health care provider. Ask your health care provider what activities are safe for you. Do not lift anything that is heavier than 10 lb (4.5 kg) for 3 days  You may shower tomorrow  Contact a health care provider if: You have more fluid or blood coming from your removal site You have more redness, swelling, or pain at your incisions or around the area where your catheter was removed Your removal site feel warm to the touch. You feel unusually weak. You feel nauseous.. Get help right away if You have swelling in your arm, shoulder, neck, or face. You develop chest pain. You have difficulty breathing. You feel dizzy or light-headed. You have pus or a bad smell coming from your removal site You have a fever. You develop bleeding from your removal site, and your bleeding does not stop. This information is not intended to replace advice given to you by your health care provider. Make sure you discuss any questions you have with your health care provider. Document Released: 05/17/2012 Document Revised:  02/01/2016 Document Reviewed: 02/24/2015 Elsevier Interactive Patient Education  2017 [employer] Inc[employer] 

## 2020-05-06 NOTE — Op Note (Signed)
  OPERATIVE NOTE   PROCEDURE: 1. Removal of a right IJ tunneled dialysis catheter  PRE-OPERATIVE DIAGNOSIS: Complication of dialysis catheter, End stage renal disease  POST-OPERATIVE DIAGNOSIS: Same  SURGEON: Misty Berry, M.D.  ANESTHESIA: Local anesthetic with 1% lidocaine with epinephrine   ESTIMATED BLOOD LOSS: Minimal   FINDING(S): 1. Catheter intact   SPECIMEN(S):  Catheter  INDICATIONS:   Misty Berry is a 85 y.o. female who presents with functioning left arm AV access.  The patient has undergone placement of an extremity access which is working and this has been successfully cannulated without difficulty.  therefore is undergoing removal of his tunneled catheter which is no longer needed to avoid septic complications.   DESCRIPTION: After obtaining full informed written consent, the patient was positioned supine. The right IJ tunneled catheter and surrounding area is prepped and draped in a sterile fashion. The cuff was localized by palpation and noted to be less than 3 cm from the exit site. After appropriate timeout is called, 1% lidocaine with epinephrine is infiltrated into the surrounding tissues around the cuff. Small transverse incision is created at the exit site with an 11 blade scalpel and the dissection was carried up along the catheter to expose the cuff of the tunneled catheter.  The catheter cuff is then freed from the surrounding attachments and adhesions. Once the catheter has been freed circumferentially it is removed in 1 piece. Light pressure was held at the base of the neck.   Antibiotic ointment and a sterile dressing is applied to the exit site. Patient tolerated procedure well and there were no complications.  COMPLICATIONS: None  CONDITION: Unchanged  Misty Berry, M.D. [facility] Office: [phone]  05/06/2020,12:35 PM

## 2020-05-06 NOTE — H&P (Signed)
[facility] Admission History & Physical  MRN : [id number]  Misty Berry is a 85 y.o. (1934-07-01) female who presents with chief complaint of here to have the catheter removed.  History of Present Illness: I am asked to evaluate the patient by the dialysis center. The patient was sent here because they have a nonfunctioning tunneled catheter and a functioning left brachial axillary AV graft.  The patient reports they're not been any problems with any of their dialysis runs. They are reporting good flows with good parameters at dialysis.  Patient denies pain or tenderness overlying the access.  There is no pain with dialysis.  The patient denies hand pain or finger pain consistent with steal syndrome.  No fevers or chills while on dialysis.   Current Facility-Administered Medications  Medication Dose Route Frequency Provider Last Rate Last Admin  . lidocaine-EPINEPHrine (XYLOCAINE-EPINEPHrine) 1 %-1:200000 (PF) injection    PRN Stesha Neyens, Dolores Lory, MD   20 mL at 05/06/20 1220    Past Medical History:  Diagnosis Date  . Asthma   . Bruises easily   . Cellulitis   . CHF (congestive heart failure) ([place])   . Cough   . DDD (degenerative disc disease), lumbar   . GERD (gastroesophageal reflux disease)   . HBP (high blood pressure)   . Hypercholesterolemia   . Hyperparathyroidism ([place])   . Osteoarthritis   . Osteoporosis   . Rash   . Renal disorder    kidney disease  . Swelling     Past Surgical History:  Procedure Laterality Date  . APPENDECTOMY    . AV FISTULA PLACEMENT Left 12/12/2019   Procedure: INSERTION OF ARTERIOVENOUS (AV) GORE-TEX GRAFT ARM ( BRACHIAL AXILLARY );  Surgeon: Katha Cabal, MD;  Location: [facility];  Service: Vascular;  Laterality: Left;  . BREAST REDUCTION SURGERY    . CHOLECYSTECTOMY    . DIALYSIS/PERMA CATHETER INSERTION N/A 10/02/2019   Procedure: DIALYSIS/PERMA CATHETER INSERTION;  Surgeon: Katha Cabal, MD;   Location: [facility];  Service: Cardiovascular;  Laterality: N/A;  . EYE SURGERY Left   . FEMUR SURGERY    . FOOT SURGERY Right   . GALLBLADDER SURGERY    . LIPECTOMY    . PAROTID GLAND TUMOR EXCISION    . PERIPHERAL VASCULAR THROMBECTOMY Left 01/16/2020   Procedure: PERIPHERAL VASCULAR THROMBECTOMY;  Surgeon: Katha Cabal, MD;  Location: [facility] [facility];  Service: Cardiovascular;  Laterality: Left;  . THYROID SURGERY    . TONSILLECTOMY    . TUBAL LIGATION    . UPPER EXTREMITY ANGIOGRAPHY Left 02/26/2020   Procedure: UPPER EXTREMITY ANGIOGRAPHY;  Surgeon: Katha Cabal, MD;  Location: [facility] [facility];  Service: Cardiovascular;  Laterality: Left;  Marland Kitchen VITRECTOMY      Social History Social History   Tobacco Use  . Smoking status: Never Smoker  . Smokeless tobacco: Never Used  Vaping Use  . Vaping Use: Never used  Substance Use Topics  . Alcohol use: No  . Drug use: No    Family History No family history on file.  No family history of bleeding or clotting disorders, autoimmune disease or porphyria  Allergies  Allergen Reactions  . Ivp Dye [Iodinated Diagnostic Agents] Other (See Comments)  . Lactose     unknown  . Methylprednisolone Other (See Comments)    Reaction: Hallucinations and Psychosis  . Peanuts [Peanut Oil] Other (See Comments)    Swelling and vomitting  . Vancomycin  Other (See Comments)    Reaction: unknown  . Wellbutrin [Bupropion] Other (See Comments)    Hallucinations     REVIEW OF SYSTEMS (Negative unless checked)  Constitutional: [] Weight loss  [] Fever  [] Chills Cardiac: [] Chest pain   [] Chest pressure   [] Palpitations   [] Shortness of breath when laying flat   [] Shortness of breath at rest   [x] Shortness of breath with exertion. Vascular:  [] Pain in legs with walking   [] Pain in legs at rest   [] Pain in legs when laying flat   [] Claudication   [] Pain in feet when walking  [] Pain in feet at rest  [] Pain in feet when laying  flat   [] History of DVT   [] Phlebitis   [] Swelling in legs   [] Varicose veins   [] Non-healing ulcers Pulmonary:   [] Uses home oxygen   [] Productive cough   [] Hemoptysis   [] Wheeze  [] COPD   [] Asthma Neurologic:  [] Dizziness  [] Blackouts   [] Seizures   [] History of stroke   [] History of TIA  [] Aphasia   [] Temporary blindness   [] Dysphagia   [] Weakness or numbness in arms   [] Weakness or numbness in legs Musculoskeletal:  [] Arthritis   [] Joint swelling   [] Joint pain   [] Low back pain Hematologic:  [] Easy bruising  [] Easy bleeding   [] Hypercoagulable state   [] Anemic  [] Hepatitis Gastrointestinal:  [] Blood in stool   [] Vomiting blood  [] Gastroesophageal reflux/heartburn   [] Difficulty swallowing. Genitourinary:  [x] Chronic kidney disease   [] Difficult urination  [] Frequent urination  [] Burning with urination   [] Blood in urine Skin:  [] Rashes   [] Ulcers   [] Wounds Psychological:  [] History of anxiety   []  History of major depression.  Physical Examination  Vitals:   05/06/20 1103  BP: (!) 131/54  Pulse: 66  Resp: 14  Temp: 97.7 F (36.5 C)  TempSrc: Oral  SpO2: 98%  Weight: 81.6 kg  Height: 5\' 2"  (1.575 m)   Body mass index is 32.92 kg/m. Gen: WD/WN, NAD Head: [place]/AT, No temporalis wasting. Prominent temp pulse not noted. Ear/Nose/Throat: Hearing grossly intact, nares w/o erythema or drainage, oropharynx w/o Erythema/Exudate,  Eyes: Conjunctiva clear, sclera non-icteric Neck: Trachea midline.  No JVD.  Pulmonary:  Good air movement, respirations not labored, no use of accessory muscles.  Cardiac: RRR, normal S1, S2. Vascular: Left arm AV graft good thrill good bruit Vessel Right Left  Radial Palpable Palpable  Ulnar Not Palpable Not Palpable  Brachial Palpable Palpable  Carotid Palpable, without bruit Palpable, without bruit  Gastrointestinal: soft, non-tender/non-distended. No guarding/reflex.  Musculoskeletal: M/S 5/5 throughout.  Extremities without ischemic changes.  No  deformity or atrophy.  Neurologic: Sensation grossly intact in extremities.  Symmetrical.  Speech is fluent. Motor exam as listed above. Psychiatric: Judgment intact, Mood & affect appropriate for pt's clinical situation. Dermatologic: No rashes or ulcers noted.  No cellulitis or open wounds.    CBC Lab Results  Component Value Date   WBC 7.6 01/18/2020   HGB 10.6 (L) 01/18/2020   HCT 34.6 (L) 01/18/2020   MCV 90.8 01/18/2020   PLT 200 01/18/2020    BMET    Component Value Date/Time   NA 137 01/18/2020 2208   NA 141 04/23/2013 0534   K 4.2 01/18/2020 2208   K 3.9 04/23/2013 0534   CL 98 01/18/2020 2208   CL 112 (H) 04/23/2013 0534   CO2 27 01/18/2020 2208   CO2 23 04/23/2013 0534   GLUCOSE 93 01/18/2020 2208   GLUCOSE 92 04/23/2013 0534  BUN 11 01/18/2020 2208   BUN 31 (H) 04/23/2013 0534   CREATININE 2.08 (H) 01/18/2020 2208   CREATININE 1.78 (H) 04/23/2013 0534   CALCIUM 8.4 (L) 01/18/2020 2208   CALCIUM 8.6 04/23/2013 0534   GFRNONAA 22 (L) 01/18/2020 2208   GFRNONAA 27 (L) 04/23/2013 0534   GFRAA 25 (L) 01/18/2020 2208   GFRAA 32 (L) 04/23/2013 0534   CrCl cannot be calculated (Patient's most recent lab result is older than the maximum 21 days allowed.).  COAG Lab Results  Component Value Date   INR 0.9 01/18/2020   INR 1.0 12/10/2019   INR 1.0 02/04/2013    Radiology No results found.  Assessment/Plan 1.  Complication dialysis device with thrombosis AV access:  Patient's right IJ tunneled catheter is malfunctioning. The patient has an extremity access that is functioning well. Therefore, the patient will undergo removal of the tunneled catheter under local anesthesia.  The risks and benefits were described to the patient.  All questions were answered.  The patient agrees to proceed with angiography and intervention. Potassium will be drawn to ensure that it is an appropriate level prior to performing intervention. 2.  End-stage renal disease requiring  hemodialysis:  Patient will continue dialysis therapy without further interruption if a successful intervention is not achieved then a tunneled catheter will be placed. Dialysis has already been arranged. 3.  Hypertension:  Patient will continue medical management; nephrology is following no changes in oral medications. 4. Diabetes mellitus:  Glucose will be monitored and oral medications been held this morning once the patient has undergone the patient's procedure po intake will be reinitiated and again Accu-Cheks will be used to assess the blood glucose level and treat as needed. The patient will be restarted on the patient's usual hypoglycemic regime 5.  Coronary artery disease:  EKG will be monitored. Nitrates will be used if needed. The patient's oral cardiac medications will be continued.    Hortencia Pilar, MD  05/06/2020 12:32 PM

## 2020-06-25 ENCOUNTER — Telehealth (INDEPENDENT_AMBULATORY_CARE_PROVIDER_SITE_OTHER): Payer: Self-pay

## 2020-06-25 ENCOUNTER — Other Ambulatory Visit (INDEPENDENT_AMBULATORY_CARE_PROVIDER_SITE_OTHER): Payer: Self-pay | Admitting: Nurse Practitioner

## 2020-06-25 ENCOUNTER — Encounter (INDEPENDENT_AMBULATORY_CARE_PROVIDER_SITE_OTHER): Payer: Self-pay | Admitting: Nurse Practitioner

## 2020-06-25 NOTE — Telephone Encounter (Signed)
Spoke with Misty at [facility] regarding the fax that was sent over regarding the patient. Per [facility] the patient has clotted and needed a declot. The patient has been scheduled with Dr. Lucky Cowboy for a left arm graft declot on 06/26/20 with a 12:30 pm arrival time to the MM. Covid testing is to be done as soon as early morning. Pre-procedure instructions will be faxed to [facility] at [facility].

## 2020-06-26 ENCOUNTER — Encounter: Payer: Self-pay | Admitting: Vascular Surgery

## 2020-06-26 ENCOUNTER — Ambulatory Visit
Admission: RE | Admit: 2020-06-26 | Discharge: 2020-06-26 | Disposition: A | Discharge status: Skilled Nursing Facility | Payer: Medicare Other | Attending: Vascular Surgery | Admitting: Vascular Surgery

## 2020-06-26 ENCOUNTER — Other Ambulatory Visit: Payer: Self-pay

## 2020-06-26 ENCOUNTER — Encounter
Admission: RE | Disposition: A | Discharge status: Skilled Nursing Facility | Payer: Self-pay | Source: Home / Self Care | Attending: Vascular Surgery

## 2020-06-26 ENCOUNTER — Other Ambulatory Visit
Admission: RE | Admit: 2020-06-26 | Discharge: 2020-06-26 | Disposition: A | Discharge status: Home / Self Care | Payer: Medicare Other | Source: Ambulatory Visit | Attending: Vascular Surgery | Admitting: Vascular Surgery

## 2020-06-26 DIAGNOSIS — I132 Hypertensive heart and chronic kidney disease with heart failure and with stage 5 chronic kidney disease, or end stage renal disease: Secondary | ICD-10-CM | POA: Insufficient documentation

## 2020-06-26 DIAGNOSIS — Y841 Kidney dialysis as the cause of abnormal reaction of the patient, or of later complication, without mention of misadventure at the time of the procedure: Secondary | ICD-10-CM | POA: Insufficient documentation

## 2020-06-26 DIAGNOSIS — Z881 Allergy status to other antibiotic agents status: Secondary | ICD-10-CM | POA: Diagnosis not present

## 2020-06-26 DIAGNOSIS — I509 Heart failure, unspecified: Secondary | ICD-10-CM | POA: Insufficient documentation

## 2020-06-26 DIAGNOSIS — Z91041 Radiographic dye allergy status: Secondary | ICD-10-CM | POA: Diagnosis not present

## 2020-06-26 DIAGNOSIS — Z91010 Allergy to peanuts: Secondary | ICD-10-CM | POA: Diagnosis not present

## 2020-06-26 DIAGNOSIS — I2510 Atherosclerotic heart disease of native coronary artery without angina pectoris: Secondary | ICD-10-CM | POA: Insufficient documentation

## 2020-06-26 DIAGNOSIS — T82868A Thrombosis of vascular prosthetic devices, implants and grafts, initial encounter: Secondary | ICD-10-CM | POA: Diagnosis present

## 2020-06-26 DIAGNOSIS — Z992 Dependence on renal dialysis: Secondary | ICD-10-CM | POA: Diagnosis not present

## 2020-06-26 DIAGNOSIS — Z20822 Contact with and (suspected) exposure to covid-19: Secondary | ICD-10-CM | POA: Insufficient documentation

## 2020-06-26 DIAGNOSIS — N186 End stage renal disease: Secondary | ICD-10-CM | POA: Diagnosis not present

## 2020-06-26 HISTORY — PX: PERIPHERAL VASCULAR THROMBECTOMY: CATH118306

## 2020-06-26 LAB — SARS CORONAVIRUS 2 BY RT PCR (HOSPITAL ORDER, PERFORMED IN ~~LOC~~ HOSPITAL LAB): SARS Coronavirus 2: NEGATIVE

## 2020-06-26 LAB — POTASSIUM (ARMC VASCULAR LAB ONLY): Potassium (ARMC vascular lab): 5 (ref 3.5–5.1)

## 2020-06-26 SURGERY — PERIPHERAL VASCULAR THROMBECTOMY
Anesthesia: Moderate Sedation | Laterality: Left

## 2020-06-26 MED ORDER — ONDANSETRON HCL 4 MG/2ML IJ SOLN: 4.0000 mg | Freq: Four times a day (QID) | INTRAMUSCULAR | Status: DC | PRN

## 2020-06-26 MED ORDER — MIDAZOLAM HCL 2 MG/ML PO SYRP: 8.0000 mg | ORAL_SOLUTION | Freq: Once | ORAL | Status: DC | PRN

## 2020-06-26 MED ORDER — ASPIRIN 81 MG PO CHEW: 81.0000 mg | CHEWABLE_TABLET | Freq: Every day | ORAL | 5 refills | Status: AC

## 2020-06-26 MED ORDER — HEPARIN SODIUM (PORCINE) 1000 UNIT/ML IJ SOLN
INTRAMUSCULAR | Status: AC
  Filled 2020-06-26: qty 1

## 2020-06-26 MED ORDER — METHYLPREDNISOLONE SODIUM SUCC 125 MG IJ SOLR
INTRAMUSCULAR | Status: AC
  Filled 2020-06-26: qty 2

## 2020-06-26 MED ORDER — HYDROCORTISONE NA SUCCINATE PF 250 MG IJ SOLR
125.0000 mg | Freq: Two times a day (BID) | INTRAMUSCULAR | Status: AC
  Administered 2020-06-26 (×2): 125 mg via INTRAVENOUS
  Filled 2020-06-26 (×3): qty 125

## 2020-06-26 MED ORDER — ALTEPLASE 2 MG IJ SOLR
INTRAMUSCULAR | Status: DC | PRN
  Administered 2020-06-26: 4 mg

## 2020-06-26 MED ORDER — FENTANYL CITRATE (PF) 100 MCG/2ML IJ SOLN
INTRAMUSCULAR | Status: DC | PRN
  Administered 2020-06-26: 50 ug via INTRAVENOUS

## 2020-06-26 MED ORDER — FAMOTIDINE 20 MG PO TABS
ORAL_TABLET | ORAL | Status: AC
  Filled 2020-06-26: qty 1

## 2020-06-26 MED ORDER — SODIUM CHLORIDE 0.9 % IV SOLN: INTRAVENOUS | Status: DC

## 2020-06-26 MED ORDER — FAMOTIDINE 20 MG PO TABS
ORAL_TABLET | ORAL | Status: AC
  Administered 2020-06-26: 40 mg via ORAL
  Filled 2020-06-26: qty 1

## 2020-06-26 MED ORDER — MIDAZOLAM HCL 5 MG/5ML IJ SOLN
INTRAMUSCULAR | Status: AC
  Filled 2020-06-26: qty 5

## 2020-06-26 MED ORDER — MIDAZOLAM HCL 2 MG/2ML IJ SOLN
INTRAMUSCULAR | Status: DC | PRN
  Administered 2020-06-26: 2 mg via INTRAVENOUS

## 2020-06-26 MED ORDER — CEFAZOLIN SODIUM-DEXTROSE 1-4 GM/50ML-% IV SOLN
1.0000 g | Freq: Once | INTRAVENOUS | Status: AC
  Administered 2020-06-26: 1 g via INTRAVENOUS

## 2020-06-26 MED ORDER — ALTEPLASE 2 MG IJ SOLR
INTRAMUSCULAR | Status: AC
  Filled 2020-06-26: qty 4

## 2020-06-26 MED ORDER — FENTANYL CITRATE (PF) 100 MCG/2ML IJ SOLN
INTRAMUSCULAR | Status: AC
  Filled 2020-06-26: qty 2

## 2020-06-26 MED ORDER — CEFAZOLIN SODIUM-DEXTROSE 2-4 GM/100ML-% IV SOLN: 2.0000 g | Freq: Once | INTRAVENOUS | Status: DC

## 2020-06-26 MED ORDER — HEPARIN SODIUM (PORCINE) 1000 UNIT/ML IJ SOLN
INTRAMUSCULAR | Status: DC | PRN
  Administered 2020-06-26: 4000 [IU] via INTRAVENOUS

## 2020-06-26 MED ORDER — HYDROMORPHONE HCL 1 MG/ML IJ SOLN: 1.0000 mg | Freq: Once | INTRAMUSCULAR | Status: DC | PRN

## 2020-06-26 MED ORDER — FAMOTIDINE 20 MG PO TABS: 40.0000 mg | ORAL_TABLET | Freq: Once | ORAL | Status: AC | PRN

## 2020-06-26 MED ORDER — ONDANSETRON HCL 4 MG/2ML IJ SOLN
INTRAMUSCULAR | Status: AC
  Filled 2020-06-26: qty 2

## 2020-06-26 MED ORDER — DIPHENHYDRAMINE HCL 50 MG/ML IJ SOLN: 50.0000 mg | Freq: Once | INTRAMUSCULAR | Status: AC | PRN

## 2020-06-26 MED ORDER — ONDANSETRON HCL 4 MG/2ML IJ SOLN
4.0000 mg | Freq: Once | INTRAMUSCULAR | Status: AC
  Administered 2020-06-26: 4 mg via INTRAVENOUS

## 2020-06-26 MED ORDER — CEFAZOLIN SODIUM-DEXTROSE 2-4 GM/100ML-% IV SOLN
INTRAVENOUS | Status: AC
  Filled 2020-06-26: qty 100

## 2020-06-26 MED ORDER — IODIXANOL 320 MG/ML IV SOLN
INTRAVENOUS | Status: DC | PRN
  Administered 2020-06-26: 25 mL via INTRAVENOUS

## 2020-06-26 MED ORDER — DIPHENHYDRAMINE HCL 50 MG/ML IJ SOLN
INTRAMUSCULAR | Status: AC
  Administered 2020-06-26: 50 mg via INTRAVENOUS
  Filled 2020-06-26: qty 1

## 2020-06-26 SURGICAL SUPPLY — 21 items
BALLN DORADO 8X100X80 (BALLOONS) ×2
BALLN LUTONIX DCB 7X60X130 (BALLOONS) ×2
BALLOON DORADO 8X100X80 (BALLOONS) ×1 IMPLANT
BALLOON LUTONIX DCB 7X60X130 (BALLOONS) ×1 IMPLANT
CANISTER PENUMBRA ENGINE (MISCELLANEOUS) ×2 IMPLANT
CANNULA 5F STIFF (CANNULA) ×2 IMPLANT
CATH BEACON 5 .035 40 KMP TP (CATHETERS) ×1 IMPLANT
CATH BEACON 5 .038 40 KMP TP (CATHETERS) ×1
CATH EMBOLECTOMY 5FR (BALLOONS) ×2 IMPLANT
CATH INDIGO 7D KIT (CATHETERS) ×2 IMPLANT
COVER PROBE U/S 5X48 (MISCELLANEOUS) ×2 IMPLANT
DRAPE BRACHIAL (DRAPES) ×2 IMPLANT
GUIDEWIRE VERSACORE 175CM (WIRE) ×4 IMPLANT
KIT ENCORE 26 ADVANTAGE (KITS) ×2 IMPLANT
PACK ANGIOGRAPHY (CUSTOM PROCEDURE TRAY) ×2 IMPLANT
SHEATH BRITE TIP 6FRX5.5 (SHEATH) ×2 IMPLANT
SHEATH BRITE TIP 7FRX5.5 (SHEATH) ×2 IMPLANT
STENT VIABAHN 8X100X120 (Permanent Stent) ×1 IMPLANT
STENT VIABAHN 8X10X120 (Permanent Stent) ×1 IMPLANT
TOWEL OR 17X26 4PK STRL BLUE (TOWEL DISPOSABLE) ×2 IMPLANT
WIRE G 018X200 V18 (WIRE) ×2 IMPLANT

## 2020-06-26 NOTE — H&P (Signed)
[facility] Admission History & Physical  MRN : [id number]  Misty Berry is a 86 y.o. (April 27, 1935) female who presents with chief complaint of nonfunctioning dialysis access.  History of Present Illness: I am asked to evaluate the patient by the dialysis center. The patient was sent here because they were unable to cannulate the patient's upper extremity dialysis access. Furthermore, the center states there is no thrill or bruit. The patient states this is the first dialysis run to be missed. This problem is acute in onset and has been present for approximately 2 days. The patient is unaware of any other change.  Patient denies pain or tenderness overlying the access.  There is no pain with dialysis.  The patient denies hand pain or finger pain consistent with steal syndrome.   Patient presents today for a scheduled declot to her upper extremity dialysis access.  Current Facility-Administered Medications  Medication Dose Route Frequency Provider Last Rate Last Admin  . ceFAZolin (ANCEF) 2-4 GM/100ML-% IVPB           . 0.9 %  sodium chloride infusion   Intravenous Continuous Kris Hartmann, NP      . ceFAZolin (ANCEF) IVPB 2g/100 mL premix  2 g Intravenous Once Kris Hartmann, NP      . diphenhydrAMINE (BENADRYL) injection 50 mg  50 mg Intravenous Once PRN Kris Hartmann, NP      . famotidine (PEPCID) tablet 40 mg  40 mg Oral Once PRN Kris Hartmann, NP      . hydrocortisone sodium succinate (SOLU-CORTEF) injection 125 mg  125 mg Intravenous BID Eulogio Ditch E, NP      . midazolam (VERSED) 2 MG/ML syrup 8 mg  8 mg Oral Once PRN Kris Hartmann, NP       Past Medical History:  Diagnosis Date  . Asthma   . Bruises easily   . Cellulitis   . CHF (congestive heart failure) ([place])   . Cough   . DDD (degenerative disc disease), lumbar   . GERD (gastroesophageal reflux disease)   . HBP (high blood pressure)   . Hypercholesterolemia   . Hyperparathyroidism  ([place])   . Osteoarthritis   . Osteoporosis   . Rash   . Renal disorder    kidney disease  . Swelling    Past Surgical History:  Procedure Laterality Date  . APPENDECTOMY    . AV FISTULA PLACEMENT Left 12/12/2019   Procedure: INSERTION OF ARTERIOVENOUS (AV) GORE-TEX GRAFT ARM ( BRACHIAL AXILLARY );  Surgeon: Katha Cabal, MD;  Location: [facility];  Service: Vascular;  Laterality: Left;  . BREAST REDUCTION SURGERY    . CHOLECYSTECTOMY    . DIALYSIS/PERMA CATHETER INSERTION N/A 10/02/2019   Procedure: DIALYSIS/PERMA CATHETER INSERTION;  Surgeon: Katha Cabal, MD;  Location: [place] [facility];  Service: Cardiovascular;  Laterality: N/A;  . DIALYSIS/PERMA CATHETER REMOVAL N/A 05/06/2020   Procedure: DIALYSIS/PERMA CATHETER REMOVAL;  Surgeon: Katha Cabal, MD;  Location: [place] [facility];  Service: Cardiovascular;  Laterality: N/A;  . EYE SURGERY Left   . FEMUR SURGERY    . FOOT SURGERY Right   . GALLBLADDER SURGERY    . LIPECTOMY    . PAROTID GLAND TUMOR EXCISION    . PERIPHERAL VASCULAR THROMBECTOMY Left 01/16/2020   Procedure: PERIPHERAL VASCULAR THROMBECTOMY;  Surgeon: Katha Cabal, MD;  Location: [place] [facility];  Service: Cardiovascular;  Laterality: Left;  . THYROID SURGERY    .  TONSILLECTOMY    . TUBAL LIGATION    . UPPER EXTREMITY ANGIOGRAPHY Left 02/26/2020   Procedure: UPPER EXTREMITY ANGIOGRAPHY;  Surgeon: Katha Cabal, MD;  Location: [place] [facility];  Service: Cardiovascular;  Laterality: Left;  Marland Kitchen VITRECTOMY     Social History Social History   Tobacco Use  . Smoking status: Never Smoker  . Smokeless tobacco: Never Used  Vaping Use  . Vaping Use: Never used  Substance Use Topics  . Alcohol use: No  . Drug use: No   Family History No family history on file.   No family history of bleeding or clotting disorders, autoimmune disease or porphyria.  Allergies  Allergen Reactions  . Ivp Dye [Iodinated Diagnostic Agents] Other  (See Comments)  . Lactose     unknown  . Methylprednisolone Other (See Comments)    Reaction: Hallucinations and Psychosis  . Peanuts [Peanut Oil] Other (See Comments)    Swelling and vomitting  . Vancomycin Other (See Comments)    Reaction: unknown  . Wellbutrin [Bupropion] Other (See Comments)    Hallucinations   REVIEW OF SYSTEMS (Negative unless checked)  Constitutional: [] Weight loss  [] Fever  [] Chills Cardiac: [] Chest pain   [] Chest pressure   [] Palpitations   [] Shortness of breath when laying flat   [] Shortness of breath at rest   [x] Shortness of breath with exertion. Vascular:  [] Pain in legs with walking   [] Pain in legs at rest   [] Pain in legs when laying flat   [] Claudication   [] Pain in feet when walking  [] Pain in feet at rest  [] Pain in feet when laying flat   [] History of DVT   [] Phlebitis   [] Swelling in legs   [] Varicose veins   [] Non-healing ulcers Pulmonary:   [] Uses home oxygen   [] Productive cough   [] Hemoptysis   [] Wheeze  [] COPD   [] Asthma Neurologic:  [] Dizziness  [] Blackouts   [] Seizures   [] History of stroke   [] History of TIA  [] Aphasia   [] Temporary blindness   [] Dysphagia   [] Weakness or numbness in arms   [] Weakness or numbness in legs Musculoskeletal:  [] Arthritis   [] Joint swelling   [] Joint pain   [] Low back pain Hematologic:  [] Easy bruising  [] Easy bleeding   [] Hypercoagulable state   [] Anemic  [] Hepatitis Gastrointestinal:  [] Blood in stool   [] Vomiting blood  [] Gastroesophageal reflux/heartburn   [] Difficulty swallowing. Genitourinary:  [x] Chronic kidney disease   [] Difficult urination  [] Frequent urination  [] Burning with urination   [] Blood in urine Skin:  [] Rashes   [] Ulcers   [] Wounds Psychological:  [] History of anxiety   []  History of major depression.  Physical Examination  Vitals:   06/26/20 1142 06/26/20 1144  BP:  (!) 138/56  Pulse:  (!) 57  Resp:  15  Temp: 98.3 F (36.8 C)   TempSrc: Oral   Weight: 81.6 kg   Height: 5\' 2"  (1.575  m)    Body mass index is 32.92 kg/m. Gen: WD/WN, NAD Head: [place]/AT, No temporalis wasting. Prominent temp pulse not noted. Ear/Nose/Throat: Hearing grossly intact, nares w/o erythema or drainage, oropharynx w/o Erythema/Exudate,  Eyes: Conjunctiva clear, sclera non-icteric Neck: Trachea midline.  No JVD.  Pulmonary:  Good air movement, respirations not labored, no use of accessory muscles.  Cardiac: RRR, normal S1, S2. Vascular:  Vessel Right Left  Radial Palpable Palpable  Ulnar Not Palpable Not Palpable  Brachial Palpable Palpable  Carotid Palpable, without bruit Palpable, without bruit   Left upper extremity: No erythema  noted.  There is no acute vascular compromise noted at this time.  The extremity is warm to the fingers.  Gastrointestinal: soft, non-tender/non-distended. No guarding/reflex.  Musculoskeletal: M/S 5/5 throughout.  Extremities without ischemic changes.  No deformity or atrophy.  Neurologic: Sensation grossly intact in extremities.  Symmetrical.  Speech is fluent. Motor exam as listed above. Psychiatric: Judgment intact, Mood & affect appropriate for pt's clinical situation. Dermatologic: No rashes or ulcers noted.  No cellulitis or open wounds. Lymph : No Cervical, Axillary, or Inguinal lymphadenopathy.  CBC Lab Results  Component Value Date   WBC 7.6 01/18/2020   HGB 10.6 (L) 01/18/2020   HCT 34.6 (L) 01/18/2020   MCV 90.8 01/18/2020   PLT 200 01/18/2020   BMET    Component Value Date/Time   NA 137 01/18/2020 2208   NA 141 04/23/2013 0534   K 4.2 01/18/2020 2208   K 3.9 04/23/2013 0534   CL 98 01/18/2020 2208   CL 112 (H) 04/23/2013 0534   CO2 27 01/18/2020 2208   CO2 23 04/23/2013 0534   GLUCOSE 93 01/18/2020 2208   GLUCOSE 92 04/23/2013 0534   BUN 11 01/18/2020 2208   BUN 31 (H) 04/23/2013 0534   CREATININE 2.08 (H) 01/18/2020 2208   CREATININE 1.78 (H) 04/23/2013 0534   CALCIUM 8.4 (L) 01/18/2020 2208   CALCIUM 8.6 04/23/2013 0534    GFRNONAA 22 (L) 01/18/2020 2208   GFRNONAA 27 (L) 04/23/2013 0534   GFRAA 25 (L) 01/18/2020 2208   GFRAA 32 (L) 04/23/2013 0534   CrCl cannot be calculated (Patient's most recent lab result is older than the maximum 21 days allowed.).  COAG Lab Results  Component Value Date   INR 0.9 01/18/2020   INR 1.0 12/10/2019   INR 1.0 02/04/2013   Radiology No results found.  Assessment/Plan 1.  Complication dialysis device with thrombosis AV access:  Patient's dialysis access is thrombosed. The patient will undergo thrombectomy using interventional techniques.  The risks and benefits were described to the patient.  All questions were answered.  The patient agrees to proceed with angiography and intervention. Potassium will be drawn to ensure that it is an appropriate level prior to performing thrombectomy. 2.  End-stage renal disease requiring hemodialysis:  Patient will continue dialysis therapy without further interruption if a successful thrombectomy is not achieved then catheter will be placed. Dialysis has already been arranged since the patient missed their previous session 3.  Hypertension:  Patient will continue medical management; nephrology is following no changes in oral medications. 4.  Coronary artery disease:  EKG will be monitored. Nitrates will be used if needed. The patient's oral cardiac medications will be continued.  Discussed with Dr. Mayme Genta, PA-C  06/26/2020 11:51 AM

## 2020-06-26 NOTE — Op Note (Signed)
[facility]    OPERATIVE NOTE   PROCEDURE: 1.  Left brachial artery to axillary vein arteriovenous graft cannulation under ultrasound guidance in both a retrograde and then antegrade fashion crossing 2.  Left arm shuntogram and central venogram 3.  Catheter directed thrombolysis with 4 mg of TPA  4.  Mechanical thrombectomy to the left brachial artery to axillary vein AV graft with the penumbra CAT 7D device 5.  Fogarty embolectomy for residual arterial plug 6.  Percutaneous transluminal angioplasty of the venous anastomosis and axillary vein with 7 mm diameter Lutonix drug-coated angioplasty balloon 7.  Covered stent placement to the venous anastomosis and axillary vein for residual stenosis after above procedures with 8 mm diameter by 10 cm length Viabahn stent  PRE-OPERATIVE DIAGNOSIS: 1. ESRD 2.  Thrombosed brachial artery to axillary vein arteriovenous graft  POST-OPERATIVE DIAGNOSIS: same as above   SURGEON: Leotis Pain, MD  ANESTHESIA: local with Moderate Conscious Sedation for approximately 40 minutes using 2 mg of Versed and 50 mcg of Fentanyl  ESTIMATED BLOOD LOSS: 200 cc  FINDING(S): 1. Thrombosed graft with high-grade stenosis/occlusion of the venous anastomosis at the proximal edge of the previously placed stent  SPECIMEN(S):  None  CONTRAST: 25 cc  FLUORO TIME: 4.6 minutes  INDICATIONS: Patient is a 86 y.o.female who presents with a thrombosed left brachial artery to axillary vein arteriovenous graft.  The patient is scheduled for an attempted declot and shuntogram.  The patient is aware the risks include but are not limited to: bleeding, infection, thrombosis of the cannulated access, and possible anaphylactic reaction to the contrast.  The patient is aware of the risks of the procedure and elects to proceed forward.  DESCRIPTION: After full informed written consent was obtained, the patient was brought back to the angiography suite and  placed supine upon the angiography table.  The patient was connected to monitoring equipment. Moderate conscious sedation was administered with a face to face encounter with the patient throughout the procedure with my supervision of the RN administering medicines and monitoring the patient's vital signs, pulse oximetry, telemetry and mental status throughout from the start of the procedure until the patient was taken to the recovery room. The left arm was prepped and draped in the standard fashion for a percutaneous access intervention.  Under ultrasound guidance, the left brachial artery to axillary vein arteriovenous graft was cannulated with a micropuncture needle under direct ultrasound guidance due to the pulseless nature of the graft in both an antegrade and a retrograde fashion crossing, and permanent images were performed.  The microwire was advanced and the needle was exchanged for the a microsheath.  I then upsized to a 6 Fr Sheath and imaging was performed.  A 7 French sheath was placed in the antegrade [place].  Hand injections were completed to image the access including the central venous system. This demonstrated no flow within the AV graft.  Based on the images, this patient will need extensive treatment to salvage the graft. I then gave the patient 4000 units of intravenous heparin.  I then placed a versa core wire into the brachial artery from the retrograde sheath and into the subclavian vein from the antegrade sheath. 4 mg of TPA were deployed. This was allowed to dwell. Mechanical thrombectomy was then performed throughout the graft and into the axillary vein with the penumbra CAT 7D device. This uncovered a high-grade stenosis or occlusion of the venous anastomosis at the proximal edge of the previously placed  stent.  A residual arterial plug was also seen at the arterial anastomosis. An attempt to clear the arterial plug was done with 3 passes of the Fogarty embolectomy balloon.This  resulted in resolution of the arterial plug, and clearance of the arterial side of the graft. The arterial outflow was seen to be intact as well on these images. The retrograde sheath was removed. I then turned my attention to the thrombus in the distal graft and the axillary vein.  Another pass was made with the penumbra CAT 7D device in the mid and distal graft and at the venous anastomosis.  There remained high-grade stenosis and I used a 7 mm diameter by 6 cm length Lutonix drug-coated angioplasty balloon inflated to 12 atm for 1 minute at the venous anastomosis and into the axillary vein.  There remains stenosis and thrombus after angioplasty creating flow limitation and so we exchanged for a 0.018 wire and used an 8 mm diameter by 10 cm length Viabahn stent a few centimeters into the axillary vein and back into the graft to encompass the lesion.  This was postdilated with an 8 mm balloon with excellent angiographic completion result and less than 10% residual stenosis.   Based on the completion imaging, no further intervention is necessary.  The wire and balloon were removed from the sheath.  A 4-0 Monocryl purse-string suture was sewn around the sheath.  The sheath was removed while tying down the suture.  A sterile bandage was applied to the puncture site.  COMPLICATIONS: None  CONDITION: Stable   Leotis Pain 06/26/2020 2:44 PM   This note was created with Dragon Medical transcription system. Any errors in dictation are purely unintentional.

## 2020-07-01 ENCOUNTER — Telehealth: Payer: Self-pay | Admitting: Cardiology

## 2020-07-01 ENCOUNTER — Ambulatory Visit: Payer: Medicare Other | Admitting: Cardiology

## 2020-07-01 ENCOUNTER — Other Ambulatory Visit: Payer: Self-pay

## 2020-07-01 NOTE — Telephone Encounter (Signed)
  Patient Consent for Virtual Visit         Misty Berry has provided verbal consent on 07/01/2020 for a virtual visit (video or telephone).   CONSENT FOR VIRTUAL VISIT FOR:  Misty Berry  By participating in this virtual visit I agree to the following:  I hereby voluntarily request, consent and authorize [facility] and its employed or contracted physicians, physician assistants, nurse practitioners or other licensed health care professionals (the Practitioner), to provide me with telemedicine health care services (the "Services") as deemed necessary by the treating Practitioner. I acknowledge and consent to receive the Services by the Practitioner via telemedicine. I understand that the telemedicine visit will involve communicating with the Practitioner through live audiovisual communication technology and the disclosure of certain medical information by electronic transmission. I acknowledge that I have been given the opportunity to request an in-person assessment or other available alternative prior to the telemedicine visit and am voluntarily participating in the telemedicine visit.  I understand that I have the right to withhold or withdraw my consent to the use of telemedicine in the course of my care at any time, without affecting my right to future care or treatment, and that the Practitioner or I may terminate the telemedicine visit at any time. I understand that I have the right to inspect all information obtained and/or recorded in the course of the telemedicine visit and may receive copies of available information for a reasonable fee.  I understand that some of the potential risks of receiving the Services via telemedicine include:  Marland Kitchen Delay or interruption in medical evaluation due to technological equipment failure or disruption; . Information transmitted may not be sufficient (e.g. poor resolution of images) to allow for appropriate medical decision making by the Practitioner;  and/or  . In rare instances, security protocols could fail, causing a breach of personal health information.  Furthermore, I acknowledge that it is my responsibility to provide information about my medical history, conditions and care that is complete and accurate to the best of my ability. I acknowledge that Practitioner's advice, recommendations, and/or decision may be based on factors not within their control, such as incomplete or inaccurate data provided by me or distortions of diagnostic images or specimens that may result from electronic transmissions. I understand that the practice of medicine is not an exact science and that Practitioner makes no warranties or guarantees regarding treatment outcomes. I acknowledge that a copy of this consent can be made available to me via my patient portal ([facility]), or I can request a printed copy by calling the office of [facility].    I understand that my insurance will be billed for this visit.   I have read or had this consent read to me. . I understand the contents of this consent, which adequately explains the benefits and risks of the Services being provided via telemedicine.  . I have been provided ample opportunity to ask questions regarding this consent and the Services and have had my questions answered to my satisfaction. . I give my informed consent for the services to be provided through the use of telemedicine in my medical care

## 2020-07-05 ENCOUNTER — Emergency Department
Admission: EM | Admit: 2020-07-05 | Discharge: 2020-07-05 | Disposition: A | Discharge status: Home / Self Care | Payer: Medicare Other | Attending: Emergency Medicine | Admitting: Emergency Medicine

## 2020-07-05 ENCOUNTER — Other Ambulatory Visit: Payer: Self-pay

## 2020-07-05 DIAGNOSIS — I951 Orthostatic hypotension: Secondary | ICD-10-CM | POA: Insufficient documentation

## 2020-07-05 DIAGNOSIS — I5032 Chronic diastolic (congestive) heart failure: Secondary | ICD-10-CM | POA: Insufficient documentation

## 2020-07-05 DIAGNOSIS — N186 End stage renal disease: Secondary | ICD-10-CM | POA: Insufficient documentation

## 2020-07-05 DIAGNOSIS — Z91010 Allergy to peanuts: Secondary | ICD-10-CM | POA: Diagnosis not present

## 2020-07-05 DIAGNOSIS — Z7901 Long term (current) use of anticoagulants: Secondary | ICD-10-CM | POA: Diagnosis not present

## 2020-07-05 DIAGNOSIS — I132 Hypertensive heart and chronic kidney disease with heart failure and with stage 5 chronic kidney disease, or end stage renal disease: Secondary | ICD-10-CM | POA: Insufficient documentation

## 2020-07-05 DIAGNOSIS — Z79899 Other long term (current) drug therapy: Secondary | ICD-10-CM | POA: Insufficient documentation

## 2020-07-05 DIAGNOSIS — J45909 Unspecified asthma, uncomplicated: Secondary | ICD-10-CM | POA: Insufficient documentation

## 2020-07-05 DIAGNOSIS — Z7982 Long term (current) use of aspirin: Secondary | ICD-10-CM | POA: Diagnosis not present

## 2020-07-05 DIAGNOSIS — R42 Dizziness and giddiness: Secondary | ICD-10-CM

## 2020-07-05 DIAGNOSIS — Z992 Dependence on renal dialysis: Secondary | ICD-10-CM | POA: Diagnosis not present

## 2020-07-05 LAB — CBC WITH DIFFERENTIAL/PLATELET
Abs Immature Granulocytes: 0.02 10*3/uL (ref 0.00–0.07)
Basophils Absolute: 0 10*3/uL (ref 0.0–0.1)
Basophils Relative: 1 %
Eosinophils Absolute: 0.3 10*3/uL (ref 0.0–0.5)
Eosinophils Relative: 5 %
HCT: 34 % — ABNORMAL LOW (ref 36.0–46.0)
Hemoglobin: 11.2 g/dL — ABNORMAL LOW (ref 12.0–15.0)
Immature Granulocytes: 0 %
Lymphocytes Relative: 26 %
Lymphs Abs: 1.6 10*3/uL (ref 0.7–4.0)
MCH: 33.6 pg (ref 26.0–34.0)
MCHC: 32.9 g/dL (ref 30.0–36.0)
MCV: 102.1 fL — ABNORMAL HIGH (ref 80.0–100.0)
Monocytes Absolute: 0.5 10*3/uL (ref 0.1–1.0)
Monocytes Relative: 9 %
Neutro Abs: 3.7 10*3/uL (ref 1.7–7.7)
Neutrophils Relative %: 59 %
Platelets: 151 10*3/uL (ref 150–400)
RBC: 3.33 MIL/uL — ABNORMAL LOW (ref 3.87–5.11)
RDW: 15.6 % — ABNORMAL HIGH (ref 11.5–15.5)
WBC: 6.1 10*3/uL (ref 4.0–10.5)
nRBC: 0 % (ref 0.0–0.2)

## 2020-07-05 LAB — BASIC METABOLIC PANEL
Anion gap: 12 (ref 5–15)
BUN: 37 mg/dL — ABNORMAL HIGH (ref 8–23)
CO2: 31 mmol/L (ref 22–32)
Calcium: 9.4 mg/dL (ref 8.9–10.3)
Chloride: 96 mmol/L — ABNORMAL LOW (ref 98–111)
Creatinine, Ser: 3.69 mg/dL — ABNORMAL HIGH (ref 0.44–1.00)
GFR, Estimated: 12 mL/min — ABNORMAL LOW (ref >60)
Glucose, Bld: 89 mg/dL (ref 70–99)
Potassium: 4.1 mmol/L (ref 3.5–5.1)
Sodium: 139 mmol/L (ref 135–145)

## 2020-07-05 LAB — MAGNESIUM: Magnesium: 2.1 mg/dL (ref 1.7–2.4)

## 2020-07-05 MED ORDER — SODIUM CHLORIDE 0.9 % IV BOLUS
500.0000 mL | Freq: Once | INTRAVENOUS | Status: AC
  Administered 2020-07-05: 500 mL via INTRAVENOUS

## 2020-07-05 MED ORDER — PANTOPRAZOLE SODIUM 40 MG PO TBEC
40.0000 mg | DELAYED_RELEASE_TABLET | Freq: Every day | ORAL | Status: DC
  Administered 2020-07-05: 40 mg via ORAL
  Filled 2020-07-05: qty 1

## 2020-07-05 MED ORDER — ASPIRIN 81 MG PO CHEW
81.0000 mg | CHEWABLE_TABLET | Freq: Every day | ORAL | Status: DC
  Filled 2020-07-05: qty 1

## 2020-07-05 MED ORDER — SERTRALINE HCL 50 MG PO TABS
100.0000 mg | ORAL_TABLET | ORAL | Status: DC
  Administered 2020-07-05: 100 mg via ORAL
  Filled 2020-07-05: qty 2

## 2020-07-05 MED ORDER — BUMETANIDE 1 MG PO TABS
1.0000 mg | ORAL_TABLET | Freq: Every day | ORAL | Status: DC
  Filled 2020-07-05: qty 1

## 2020-07-05 MED ORDER — DOCUSATE SODIUM 100 MG PO CAPS
100.0000 mg | ORAL_CAPSULE | Freq: Two times a day (BID) | ORAL | Status: DC
  Filled 2020-07-05: qty 1

## 2020-07-05 MED ORDER — METOPROLOL TARTRATE 25 MG PO TABS
25.0000 mg | ORAL_TABLET | ORAL | Status: DC
Start: 2020-07-05 — End: 2020-07-05
  Filled 2020-07-05: qty 1

## 2020-07-05 MED ORDER — SEVELAMER CARBONATE 800 MG PO TABS
1600.0000 mg | ORAL_TABLET | Freq: Three times a day (TID) | ORAL | Status: DC
  Administered 2020-07-05: 1600 mg via ORAL
  Filled 2020-07-05 (×3): qty 2

## 2020-07-05 MED ORDER — CLOPIDOGREL BISULFATE 75 MG PO TABS
75.0000 mg | ORAL_TABLET | Freq: Every day | ORAL | Status: DC
  Filled 2020-07-05: qty 1

## 2020-07-05 MED ORDER — BUSPIRONE HCL 5 MG PO TABS
5.0000 mg | ORAL_TABLET | Freq: Every day | ORAL | Status: DC
  Administered 2020-07-05: 5 mg via ORAL
  Filled 2020-07-05: qty 1

## 2020-07-05 NOTE — TOC Progression Note (Signed)
Transition of Care [facility]) - Progression Note    Patient Details  Name: Misty Berry MRN: [id number] Date of Birth: 10-28-1934  [facility] of [facility]) CM/SW Ruth, LCSW Phone Number: 07/05/2020, 8:46 AM  Clinical Narrative:   Patient is from [place] made contact with  Nanine Means to alert staff that the patient is ready for discharge -  [phone]. CSW made contact with Beckie Salts, health care [facility], [phone].   Plan/outcome: [facility] driver will pick the patient up from [facility] and transport the patient to [facility].

## 2020-07-05 NOTE — ED Notes (Signed)
This RN called Nanine Means and updated them on pts condition and discharge. Awaiting transport back to facility until inclement weather clears.

## 2020-07-05 NOTE — ED Triage Notes (Signed)
Pt arrives from [facility] center with c/o dizziness since after dialysis.

## 2020-07-05 NOTE — ED Provider Notes (Signed)
[facility] Emergency Department Provider Note  ____________________________________________   Event Date/Time   First MD Initiated Contact with Patient 07/05/20 [phone]     (approximate)  I have reviewed the triage vital signs and the nursing notes.   HISTORY  Chief Complaint Dizziness    HPI Misty Berry is a 86 y.o. female with medical history as listed below which notably includes relatively recently starting on hemodialysis for her end-stage renal disease (within the last several months).  She presents tonight for evaluation of lightheadedness that has been going on since this afternoon when she had dialysis.  She says she did not feel quite right after starting dialysis this afternoon and started to feel uncomfortable and lightheaded.  She did not last for the full session and then went home.  She thought maybe she was started feel better but continued to feel lightheaded particularly when she moves around or tries to stand up to ambulate.  No other specific symptoms.  She denies sensation of vertigo or room spinning, numbness or weakness in any of her extremities, chest pain, shortness of breath, nausea, vomiting, and abdominal pain.  She still produces urine but has had no burning when she urinates.  She describes the symptoms as moderate in severity and concerning to her but has been present now for about 12 hours.  Nothing in particular makes them better and they are worsened with position changes as described above.  She says she does not normally feel poorly after dialysis.  No recent fever, chills, or sore throat, and she says she is vaccinated for COVID-19.         Past Medical History:  Diagnosis Date  . Asthma   . Bruises easily   . Cellulitis   . CHF (congestive heart failure) ([place])   . Cough   . DDD (degenerative disc disease), lumbar   . GERD (gastroesophageal reflux disease)   . HBP (high blood pressure)   . Hypercholesterolemia   .  Hyperparathyroidism ([place])   . Osteoarthritis   . Osteoporosis   . Rash   . Renal disorder    kidney disease  . Swelling     Patient Active Problem List   Diagnosis Date Noted  . Atherosclerosis of native arteries of extremity with rest pain ([place]) 03/20/2020  . Complication of vascular access for dialysis 03/20/2020  . Asthmatic bronchitis 02/14/2020  . History of end stage renal disease 12/29/2019  . Swelling   . Hypertensive heart and renal disease   . Palliative care by specialist   . Advanced care planning/counseling discussion   . Acute on chronic diastolic heart failure ([place])   . CHF (congestive heart failure) ([place]) 09/28/2019  . Lymphedema 09/10/2019  . Iron deficiency anemia 08/07/2019  . Acute respiratory failure with hypoxia ([place]) 07/02/2019  . Anemia of chronic kidney failure, stage 5 ([place]) 07/02/2019  . Dyspnea   . Hypoxemia 06/27/2019  . Acute on chronic systolic (congestive) heart failure ([place]) 06/27/2019  . Memory loss 02/06/2019  . Osteoporosis 03/22/2018  . Atrial tachycardia ([place]) 10/03/2017  . HLD (hyperlipidemia) 10/03/2017  . Class 2 severe obesity due to excess calories with serious comorbidity in adult ([place]) 09/10/2016  . Primary osteoarthritis involving multiple joints 06/09/2016  . Chronic pain of left knee 11/28/2015  . Urinary incontinence in female 11/28/2015  . Acid reflux 08/27/2015  . Cellulitis 08/27/2015  . Abnormal bruising 08/27/2015  . Arthritis, degenerative 08/27/2015  . OP (osteoporosis) 08/27/2015  . Presence  of artificial eye 08/27/2015  . History of surgical procedure 08/27/2015  . Risk for falls 07/31/2015  . Eye globe prosthesis 07/31/2015  . Hypertensive heart disease with stage 4 chronic kidney disease ([place]) 04/30/2015  . Laceration of forearm without foreign body 04/30/2015  . Hypertensive heart and kidney disease with systolic chf, NYHA class 2 and ckd, stage 4 ([place]) 04/30/2015  . Venous insufficiency of leg 02/28/2015  .  Venous insufficiency of both lower extremities 02/28/2015  . Gluteal tendinitis 01/22/2015  . Degenerative arthritis of lumbar spine 12/12/2014  . Back muscle spasm 11/14/2014  . Degeneration of intervertebral disc of lumbar region 11/14/2014  . Neuritis or radiculitis due to rupture of lumbar intervertebral disc 11/14/2014  . Allergic rhinitis 08/31/2014  . Clinical depression 02/19/2014  . Gait instability 02/19/2014  . Depressive disorder, not elsewhere classified 02/19/2014  . Benign hypertensive heart and kidney disease 10/31/2013  . Left heart failure ([place]) 10/31/2013  . Branch retinal vein occlusion 09/11/2013  . BRVO (branch retinal vein occlusion) 09/11/2013  . Asthma 06/20/2013  . Essential hypertension 06/20/2013  . Follow-up examination 04/30/2013  . Acute renal failure ([place]) 02/16/2013  . Fall 02/16/2013  . Closed fracture of part of neck of femur ([place]) 02/16/2013  . Cellulitis and abscess of leg 10/30/2012  . Cellulitis of lower extremity 10/30/2012  . End stage renal disease ([place]) 10/30/2012  . Cellulitis of leg, left 10/30/2012  . Central retinal edema, cystoid 01/31/2012  . CME (cystoid macular edema) 01/31/2012  . Impaired vision 12/17/2011  . Complete loss of vision 12/17/2011    Past Surgical History:  Procedure Laterality Date  . APPENDECTOMY    . AV FISTULA PLACEMENT Left 12/12/2019   Procedure: INSERTION OF ARTERIOVENOUS (AV) GORE-TEX GRAFT ARM ( BRACHIAL AXILLARY );  Surgeon: Katha Cabal, MD;  Location: [facility] ORS;  Service: Vascular;  Laterality: Left;  . BREAST REDUCTION SURGERY    . CHOLECYSTECTOMY    . DIALYSIS/PERMA CATHETER INSERTION N/A 10/02/2019   Procedure: DIALYSIS/PERMA CATHETER INSERTION;  Surgeon: Katha Cabal, MD;  Location: [place] [facility];  Service: Cardiovascular;  Laterality: N/A;  . DIALYSIS/PERMA CATHETER REMOVAL N/A 05/06/2020   Procedure: DIALYSIS/PERMA CATHETER REMOVAL;  Surgeon: Katha Cabal, MD;  Location:  [place] [facility];  Service: Cardiovascular;  Laterality: N/A;  . EYE SURGERY Left   . FEMUR SURGERY    . FOOT SURGERY Right   . GALLBLADDER SURGERY    . LIPECTOMY    . PAROTID GLAND TUMOR EXCISION    . PERIPHERAL VASCULAR THROMBECTOMY Left 01/16/2020   Procedure: PERIPHERAL VASCULAR THROMBECTOMY;  Surgeon: Katha Cabal, MD;  Location: [place] [facility] LAB;  Service: Cardiovascular;  Laterality: Left;  . PERIPHERAL VASCULAR THROMBECTOMY Left 06/26/2020   Procedure: PERIPHERAL VASCULAR THROMBECTOMY;  Surgeon: Algernon Huxley, MD;  Location: [place] [facility];  Service: Cardiovascular;  Laterality: Left;  . THYROID SURGERY    . TONSILLECTOMY    . TUBAL LIGATION    . UPPER EXTREMITY ANGIOGRAPHY Left 02/26/2020   Procedure: UPPER EXTREMITY ANGIOGRAPHY;  Surgeon: Katha Cabal, MD;  Location: [place] [facility];  Service: Cardiovascular;  Laterality: Left;  Marland Kitchen VITRECTOMY      Prior to Admission medications   Medication Sig Start Date End Date Taking? Authorizing Provider  acidophilus (RISAQUAD) CAPS capsule Take 2 capsules by mouth 3 (three) times daily. 10/06/19   Lorella Nimrod, MD  aspirin 81 MG chewable tablet Chew 1 tablet (81 mg total) by  mouth daily. (0800) 06/26/20   Dew, Erskine Squibb, MD  bumetanide (BUMEX) 1 MG tablet Take 1 tablet (1 mg total) by mouth daily. Patient taking differently: Take 1 mg by mouth daily. Hold for SBP less than 110 10/07/19   Lorella Nimrod, MD  busPIRone (BUSPAR) 5 MG tablet Take 5 mg by mouth daily. (0800) 08/16/19   [provider]  calcium-vitamin D (OSCAL WITH D) 500-200 MG-UNIT tablet Take 1 tablet by mouth daily.    [provider]  clopidogrel (PLAVIX) 75 MG tablet Take 1 tablet (75 mg total) by mouth daily. 02/26/20   Schnier, Dolores Lory, MD  docusate sodium (COLACE) 100 MG capsule Take 1 capsule (100 mg total) by mouth 2 (two) times daily. Patient not taking: Reported on 06/26/2020 07/02/19   Edwin Dada, MD  Ergocalciferol  (VITAMIN D2) 50 MCG (2000 UT) TABS Take 1 tablet by mouth daily.     [provider]  esomeprazole (NEXIUM) 40 MG capsule Take 40 mg by mouth daily. (0800)    [provider]  ferrous gluconate (FERGON) 324 MG tablet Take 324 mg by mouth daily.    [provider]  losartan (COZAAR) 50 MG tablet Take 1 tablet (50 mg total) by mouth daily. Patient not taking: Reported on 06/26/2020 10/07/19   Lorella Nimrod, MD  metoprolol tartrate (LOPRESSOR) 25 MG tablet Take 25 mg by mouth 4 (four) times a week. on Mon, Wed, Fri, and Sun. Hold for SBP less than 110    [provider]  Multiple Vitamins-Minerals (CENTRUM SILVER PO) Take 1 tablet by mouth daily.    [provider]  Omega-3 Fatty Acids (FISH OIL) 1000 MG CAPS Take 4,000 mg by mouth daily.     [provider]  sertraline (ZOLOFT) 100 MG tablet Take 100 mg by mouth every morning. 0800    [provider]  sevelamer carbonate (RENVELA) 800 MG tablet Take 1,600 mg by mouth 3 (three) times daily with meals. Patient not taking: Reported on 06/26/2020    [provider]  Skin Protectants, Misc. (BAZA PROTECT EX) Apply 1 application topically every 4 (four) hours.     [provider]  traMADol (ULTRAM) 50 MG tablet Take 1 tablet (50 mg total) by mouth every 6 (six) hours as needed for moderate pain. Patient not taking: Reported on 06/26/2020 07/02/19   Edwin Dada, MD    Allergies Ivp dye [iodinated diagnostic agents], Lactose, Methylprednisolone, Peanuts [peanut oil], Vancomycin, and Wellbutrin [bupropion]  No family history on file.  Social History Social History   Tobacco Use  . Smoking status: Never Smoker  . Smokeless tobacco: Never Used  Vaping Use  . Vaping Use: Never used  Substance Use Topics  . Alcohol use: No  . Drug use: No    Review of Systems Constitutional: No fever/chills Eyes: No visual changes. ENT: No sore throat. Cardiovascular: Denies  chest pain. Respiratory: Denies shortness of breath. Gastrointestinal: No abdominal pain.  No nausea, no vomiting.  No diarrhea.  No constipation. Genitourinary: Negative for dysuria. Musculoskeletal: Negative for neck pain.  Negative for back pain. Integumentary: Negative for rash. Neurological: Lightheadedness when changing position.  Negative for headaches, focal weakness or numbness.   ____________________________________________   PHYSICAL EXAM:  VITAL SIGNS: ED Triage Vitals  Enc Vitals Group     BP 07/05/20 0321 (!) 97/57     Pulse Rate 07/05/20 0321 69     Resp 07/05/20 0321 16     Temp  07/05/20 0321 98.1 F (36.7 C)     Temp Source 07/05/20 0321 Oral     SpO2 07/05/20 0321 100 %     Weight 07/05/20 0306 81.6 kg (180 lb)     Height 07/05/20 0306 1.575 m ('5\' 2"'$ )     Head Circumference --      Peak Flow --      Pain Score 07/05/20 0306 0     Pain Loc --      Pain Edu? --      Excl. in [place]? --     Constitutional: Alert and oriented.  Eyes: Conjunctivae are normal.  Head: Atraumatic. Nose: No congestion/rhinnorhea. Mouth/Throat: Patient is wearing a mask. Neck: No stridor.  No meningeal signs.   Cardiovascular: Normal rate, regular rhythm. Good peripheral circulation. Respiratory: Normal respiratory effort.  No retractions. Gastrointestinal: Soft and nontender. No distention.  Musculoskeletal: No lower extremity tenderness nor edema. No gross deformities of extremities. Neurologic:  Normal speech and language. No gross focal neurologic deficits are appreciated.  Skin:  Skin is warm, dry and intact. Psychiatric: Mood and affect are normal. Speech and behavior are normal.  ____________________________________________   LABS (all labs ordered are listed, but only abnormal results are displayed)  Labs Reviewed  BASIC METABOLIC PANEL - Abnormal; Notable for the following components:      Result Value   Chloride 96 (*)    BUN 37 (*)    Creatinine, Ser 3.69 (*)     GFR, Estimated 12 (*)    All other components within normal limits  CBC WITH DIFFERENTIAL/PLATELET - Abnormal; Notable for the following components:   RBC 3.33 (*)    Hemoglobin 11.2 (*)    HCT 34.0 (*)    MCV 102.1 (*)    RDW 15.6 (*)    All other components within normal limits  MAGNESIUM   ____________________________________________  EKG  ED ECG REPORT I, Hinda Kehr, the attending physician, personally viewed and interpreted this ECG.  Date: 07/05/2020 EKG Time: 3:20 AM Rate: 72 Rhythm: normal sinus rhythm QRS Axis: normal Intervals: normal ST/T Wave abnormalities: Non-specific ST segment / T-wave changes, but no clear evidence of acute ischemia. Narrative Interpretation: no definitive evidence of acute ischemia; does not meet STEMI criteria.   ____________________________________________  RADIOLOGY I, Hinda Kehr, personally viewed and evaluated these images (plain radiographs) as part of my medical decision making, as well as reviewing the written report by the radiologist.  ED MD interpretation: No indication for emergent imaging  Official radiology report(s): No results found.  ____________________________________________   PROCEDURES   Procedure(s) performed (including Critical Care):  Procedures   ____________________________________________   INITIAL IMPRESSION / MDM / [place] / ED COURSE  As part of my medical decision making, I reviewed the following data within the [facility] notes reviewed and incorporated, Labs reviewed , EKG interpreted , Old chart reviewed and Notes from prior ED visits   Differential diagnosis includes, but is not limited to, orthostatic symptoms due to dialysis, electrolyte or metabolic abnormality, acute infection, less likely CVA.  Patient's vital signs are notable for some mild hypotension which is relatively consistent over multiple measurements.  I suspect she is volume  depleted after dialysis which is causing her to be orthostatic and specifically lightheaded when changing position.  She has no other signs or symptoms that are suggestive of an infectious process and she has no focal neurological deficits to suggest a CVA, nor does she have any  signs of central vertigo but would also suggest a CVA.  Lab work is pending.  EKG is generally reassuring with no signs of ischemia nor arrhythmia.  I am giving her a 500 mL normal saline IV bolus and will reassess.     Clinical Course as of 07/05/20 0645  Sat Jul 05, 2020  [id number] The patient says that she feels completely back to normal after her IV fluids.  One of the ED techs helped her to the bathroom and the patient was able to ambulate using a walker without any difficulty or requiring any assistance.  She did not have any recurrence of her lightheadedness.  I believe that her lightheadedness was due to orthostasis after hemodialysis and I encouraged her to follow-up with her regular doctor.  Her basic metabolic panel is essentially normal other than her chronic kidney disease, and her CBC is reassuring.  Magnesium level is normal.  She is comfortable with the plan for discharge home.  I gave my usual and customary return precautions. [CF]    Clinical Course User Index [CF] Hinda Kehr, MD     ____________________________________________  FINAL CLINICAL IMPRESSION(S) / ED DIAGNOSES  Final diagnoses:  Orthostatic dizziness     MEDICATIONS GIVEN DURING THIS VISIT:  Medications  sodium chloride 0.9 % bolus 500 mL (0 mLs Intravenous Stopped 07/05/20 0510)     ED Discharge Orders    None      *Please note:  Misty Berry was evaluated in Emergency Department on 07/05/2020 for the symptoms described in the history of present illness. She was evaluated in the context of the global COVID-19 pandemic, which necessitated consideration that the patient might be at risk for infection with the SARS-CoV-2 virus  that causes COVID-19. Institutional protocols and algorithms that pertain to the evaluation of patients at risk for COVID-19 are in a state of rapid change based on information released by regulatory bodies including the CDC and federal and state organizations. These policies and algorithms were followed during the patient's care in the ED.  Some ED evaluations and interventions may be delayed as a result of limited staffing during and after the pandemic.*  Note:  This document was prepared using Dragon voice recognition software and may include unintentional dictation errors.   Hinda Kehr, MD 07/05/20 [phone]

## 2020-07-05 NOTE — Discharge Instructions (Signed)
As we discussed, we believe your symptoms were due to having a little bit too much fluid taken out during dialysis on this particular session.  This can happen sometimes and you may not need any adjustment at the next time to go to dialysis.  Follow-up with your regular doctor to discuss the symptoms.  Take your regular medications today and drink plenty of fluids.  Return to the emergency department if you develop new or worsening symptoms that concern you.

## 2020-07-15 ENCOUNTER — Encounter: Payer: Self-pay | Admitting: Family

## 2020-07-15 ENCOUNTER — Other Ambulatory Visit: Payer: Self-pay

## 2020-07-15 ENCOUNTER — Ambulatory Visit (INDEPENDENT_AMBULATORY_CARE_PROVIDER_SITE_OTHER): Payer: Medicare Other | Admitting: Family

## 2020-07-15 ENCOUNTER — Encounter (INDEPENDENT_AMBULATORY_CARE_PROVIDER_SITE_OTHER): Payer: Medicare Other

## 2020-07-15 ENCOUNTER — Ambulatory Visit (INDEPENDENT_AMBULATORY_CARE_PROVIDER_SITE_OTHER): Payer: Medicare Other | Admitting: Nurse Practitioner

## 2020-07-15 VITALS — BP 150/60 | HR 68 | Ht 62.0 in | Wt 195.0 lb

## 2020-07-15 DIAGNOSIS — N186 End stage renal disease: Secondary | ICD-10-CM

## 2020-07-15 DIAGNOSIS — I428 Other cardiomyopathies: Secondary | ICD-10-CM | POA: Diagnosis not present

## 2020-07-15 DIAGNOSIS — I5020 Unspecified systolic (congestive) heart failure: Secondary | ICD-10-CM | POA: Diagnosis not present

## 2020-07-15 NOTE — Progress Notes (Signed)
Office Visit    Patient Name: Demetriss Ko Mortellaro Date of Encounter: 07/15/2020  Primary Care Provider:  Franciso Bend, MD Primary Cardiologist:  Kate Sable, MD Electrophysiologist:  None   Chief Complaint    Misty Berry is a 86 y.o. female with a hx of HTN, HLD, HFrEF/NICM, HTN, ESRD  presents today for follow-up of heart failure  Past Medical History    Past Medical History:  Diagnosis Date  . Asthma   . Bruises easily   . Cellulitis   . CHF (congestive heart failure) ([place])   . Cough   . DDD (degenerative disc disease), lumbar   . GERD (gastroesophageal reflux disease)   . HBP (high blood pressure)   . Hypercholesterolemia   . Hyperparathyroidism ([place])   . Osteoarthritis   . Osteoporosis   . Rash   . Renal disorder    kidney disease  . Swelling    Past Surgical History:  Procedure Laterality Date  . APPENDECTOMY    . AV FISTULA PLACEMENT Left 12/12/2019   Procedure: INSERTION OF ARTERIOVENOUS (AV) GORE-TEX GRAFT ARM ( BRACHIAL AXILLARY );  Surgeon: Katha Cabal, MD;  Location: [facility];  Service: Vascular;  Laterality: Left;  . BREAST REDUCTION SURGERY    . CHOLECYSTECTOMY    . DIALYSIS/PERMA CATHETER INSERTION N/A 10/02/2019   Procedure: DIALYSIS/PERMA CATHETER INSERTION;  Surgeon: Katha Cabal, MD;  Location: [place] [facility];  Service: Cardiovascular;  Laterality: N/A;  . DIALYSIS/PERMA CATHETER REMOVAL N/A 05/06/2020   Procedure: DIALYSIS/PERMA CATHETER REMOVAL;  Surgeon: Katha Cabal, MD;  Location: [place] [facility];  Service: Cardiovascular;  Laterality: N/A;  . EYE SURGERY Left   . FEMUR SURGERY    . FOOT SURGERY Right   . GALLBLADDER SURGERY    . LIPECTOMY    . PAROTID GLAND TUMOR EXCISION    . PERIPHERAL VASCULAR THROMBECTOMY Left 01/16/2020   Procedure: PERIPHERAL VASCULAR THROMBECTOMY;  Surgeon: Katha Cabal, MD;  Location: [place] [facility];  Service: Cardiovascular;  Laterality: Left;  . PERIPHERAL VASCULAR  THROMBECTOMY Left 06/26/2020   Procedure: PERIPHERAL VASCULAR THROMBECTOMY;  Surgeon: Algernon Huxley, MD;  Location: [place] [facility];  Service: Cardiovascular;  Laterality: Left;  . THYROID SURGERY    . TONSILLECTOMY    . TUBAL LIGATION    . UPPER EXTREMITY ANGIOGRAPHY Left 02/26/2020   Procedure: UPPER EXTREMITY ANGIOGRAPHY;  Surgeon: Katha Cabal, MD;  Location: [place] [facility];  Service: Cardiovascular;  Laterality: Left;  Marland Kitchen VITRECTOMY      Allergies  Allergies  Allergen Reactions  . Ivp Dye [Iodinated Diagnostic Agents] Other (See Comments)  . Lactose     unknown  . Methylprednisolone Other (See Comments)    Reaction: Hallucinations and Psychosis  . Peanuts [Peanut Oil] Other (See Comments)    Swelling and vomitting  . Vancomycin Other (See Comments)    Reaction: unknown  . Wellbutrin [Bupropion] Other (See Comments)    Hallucinations    History of Present Illness    Judieth Junior Swab is a 86 y.o. female with a hx of HTN, HLD HFrEF/NICM, HTN, ESRD last seen 02/21/20.   No history of CAD or heart attack. She has previously been followed with cardiology [facility].  Admitted 06/2019.  She Lexiscan Myoview during mission showed small defect of moderate severity in apex location likely breast attenuation artifact, intermediate risk study due to reduced EF, no evidence of reversibility cystograms ischemia no perforation deficits to suggest  previous infarct.  Echocardiogram 06/27/2019 LVEF 35-40%, LV inferior wall hypokinesis, no significant valvular abnormalities.  Admitted 09/28/2027 due to chronic heart failure with worsening renal function..  Vascular, wound, palliative care consults were obtained.  She was started on dialysis due to worsening renal function. She was discharged to SNF at [facility] and has since returned to [place].  She enjoyed her time participating in physical therapy at [place] and is hopeful to be able to continue physical therapy  therapy at [facility].  She was last seen 12/12/19 for preoperative clearance for insertion of AV Gore-Tex graft arm today with Dr. Franchot Gallo of vascular. She was doing well with no anginal symptoms. No changes were made.   ED visit 01/18/20 for hypotension treated with IVF and abx.   She was seen by [facility] HF clinic 02/05/20. She had pedal edema which was stable. No changes were made. Seen in follow up 02/21/20 with stable NYHAII symptoms. Her weight was down 8 pounds over the previous 3 months.   She has had multiple issues with her dialysis access. Procedures 02/26/20  with Dr. Delana Meyer due to steal syndrome secondary to left brachial axillary AV axis.  On 02/26/20 she had angioplasty with 15% residual stenosis in radial artery. Repeat procedure 05/06/20 tunneled catheter was removed as left arm AV access was functioning. On 06/26/20 she had repeat procedure due to high grade stenosis or occlusion at prox edge of previously placed stent. Postdilated with 45m balloon with less than 10% residual stenosis.   She presents today for a [facility]  He reports feeling overall well.  Tells me dialysis has been going well.  Tells me she had an episode of low blood pressure in January associated with lightheadedness that worried her but this has not recurred.  Tells me her blood pressure is still occasionally low during hemodialysis but it is asymptomatic. Reports no shortness of breath at rest and tells me her dyspnea occurs only with more than usual activity stable at her baseline.   Reports no chest pain, pressure, or tightness. No edema, orthopnea, PND. Reports no palpitations.   EKGs/Labs/Other Studies Reviewed:   The following studies were reviewed today:  2D Echocardiogram  TTE 06/27/2019 1. Left ventricular ejection fraction, by visual estimation, is 35 to 40%. The left ventricle has moderately decreased function. There is no left ventricular  hypertrophy.  2. Left ventricular diastolic function could not be evaluated.  3. The left ventricle demonstrates regional wall motion abnormalities.  4. LV inferior wall hypokinesis.  5. Global right ventricle was not well visualized.The right ventricular size is not assessed. Right vetricular wall thickness was not assessed.  6. Left atrial size was normal.  7. Right atrial size was not well visualized.  8. The mitral valve is normal in structure. No evidence of mitral valve regurgitation.  9. The tricuspid valve is grossly normal. 10. The aortic valve is grossly normal. Aortic valve regurgitation is not visualized. 11. Pulmonic regurgitation not assessed. 12. The pulmonic valve was not well visualized. Pulmonic valve regurgitation not assessed. 13. The interatrial septum was not well visualized.  Lexiscan Myoview 06/2019 Defect 1: There is a small defect of moderate severity present in the apex location. This is likely due to breast attenuation artifact This is an intermediate risk study due to reduced EF The left ventricular ejection fraction is moderately decreased (30-44%). No evidence of reversibility to suggest ischemia and no large perfusion defects to suggest  previous infarct. Suspect nonischemic cardiomyopathy.  EKG: No EKG is  ordered today.  The ekg independently reviewed from 02/21/2020 demonstrated normal sinus rhythm 76 bpm with LVH.  No acute ST/T wave changes.  Recent Labs: 10/01/2019: B Natriuretic Peptide 690.0 01/18/2020: ALT 12 07/05/2020: BUN 37; Creatinine, Ser 3.69; Hemoglobin 11.2; Magnesium 2.1; Platelets 151; Potassium 4.1; Sodium 139  Recent Lipid Panel    Component Value Date/Time   TRIG 224 (H) 02/07/2013 1140    Home Medications   Current Meds  Medication Sig  . acidophilus (RISAQUAD) CAPS capsule Take 2 capsules by mouth 3 (three) times daily.  Marland Kitchen aspirin 81 MG chewable tablet Chew 1 tablet (81 mg total) by mouth daily. (0800)  . bumetanide (BUMEX) 1 MG  tablet Take 1 tablet (1 mg total) by mouth daily.  . busPIRone (BUSPAR) 5 MG tablet Take 5 mg by mouth daily. (0800)  . calcium-vitamin D (OSCAL WITH D) 500-200 MG-UNIT tablet Take 1 tablet by mouth daily.  . Cholecalciferol 50 MCG (2000 UT) CAPS Take by mouth daily.  . clopidogrel (PLAVIX) 75 MG tablet Take 1 tablet (75 mg total) by mouth daily.  Marland Kitchen docusate sodium (COLACE) 100 MG capsule Take 1 capsule (100 mg total) by mouth 2 (two) times daily.  Marland Kitchen esomeprazole (NEXIUM) 40 MG capsule Take 40 mg by mouth daily. (0800)  . ferrous gluconate (FERGON) 324 MG tablet Take 324 mg by mouth daily.  . hydrOXYzine (ATARAX/VISTARIL) 25 MG tablet Take 25 mg by mouth 2 (two) times daily as needed.  Marland Kitchen LIDOCAINE-PRILOCAINE EX Apply topically every Monday, Wednesday, and Friday.  . losartan (COZAAR) 50 MG tablet Take 1 tablet (50 mg total) by mouth daily.  . metoprolol tartrate (LOPRESSOR) 25 MG tablet Take 25 mg by mouth daily.  . Multiple Vitamins-Minerals (CENTRUM SILVER PO) Take 1 tablet by mouth daily.  . Omega-3 Fatty Acids (FISH OIL) 1000 MG CAPS Take 4,000 mg by mouth daily.   . sertraline (ZOLOFT) 100 MG tablet Take 100 mg by mouth every morning. 0800  . sevelamer carbonate (RENVELA) 800 MG tablet Take 1,600 mg by mouth 3 (three) times daily with meals.  . Skin Protectants, Misc. (BAZA PROTECT EX) Apply 1 application topically every 4 (four) hours.   . traMADol (ULTRAM) 50 MG tablet Take 1 tablet (50 mg total) by mouth every 6 (six) hours as needed for moderate pain.    Review of Systems   Review of Systems  Constitutional: Negative for chills, fever and malaise/fatigue.  Cardiovascular: Positive for dyspnea on exertion. Negative for chest pain, leg swelling, near-syncope, orthopnea, palpitations and syncope.  Respiratory: Negative for cough, shortness of breath and wheezing.   Gastrointestinal: Negative for nausea and vomiting.  Neurological: Negative for dizziness, light-headedness and weakness.    All other systems reviewed and are otherwise negative except as noted above.  Physical Exam    VS:  BP (!) 150/60 (BP Location: Right Arm, Patient Position: Sitting, Cuff Size: Normal)   Pulse 68   Ht '5\' 2"'$  (1.575 m)   Wt 195 lb (88.5 kg)   BMI 35.67 kg/m  , BMI Body mass index is 35.67 kg/m. GEN: Well nourished, overweight, well developed, in no acute distress. HEENT: normal. Neck: Supple, no JVD, carotid bruits, or masses. Cardiac: RRR, no murmurs, rubs, or gallops. No clubbing, cyanosis.  Radials/PT 2+ and equal bilaterally. Respiratory:  Respirations regular and unlabored, clear to auscultation bilaterally. GI: Soft, nontender, nondistended,  MS: No deformity or atrophy. Skin: Warm and dry,  no rash.  Bilateral lower extremity in [place] boot. Neuro:  Strength and sensation are intact. Psych: Normal affect.  Assessment & Plan    1. HFrEF/NICM - Stable NYHA II with dyspnea on exertion. Euvolemic on exam and well compensated.  Volume management by hemodialysis.  LVEF 35-40% by echo 06/2019. Low risk stress test in January 2021 indicate nonischemic cardiomyopathy.  Heart failure therapies limited by ESRD. GDMT includes losartan, metoprolol.  Previously on hydralazine though with hypotension this was discontinued.  She reports still low blood pressures during dialysis will defer reinitiation.   Encourage low-sodium, heart diet.  Encourage fluid restriction less than 2L.  2. ESRD - Continue to follow with nephrology and hemodialysis.  Heart failure therapy limited by ESRD. Volume management per HD.  Avoid nephrotoxic agents.   Disposition: Follow up in 6 month(s) with Dr. Justice Deeds, NP 07/15/2020, 1:56 PM

## 2020-07-15 NOTE — Patient Instructions (Addendum)
Medication Instructions:  No medication changes today.   *If you need a refill on your cardiac medications before your next appointment, please call your pharmacy*   Lab Work: None ordered today.   Testing/Procedures: None ordered today.   Follow-Up: At [facility], you and your health needs are our priority.  As part of our continuing mission to provide you with exceptional heart care, we have created designated Provider Care Teams.  These Care Teams include your primary Cardiologist (physician) and Advanced Practice Providers (APPs -  Physician Assistants and Nurse Practitioners) who all work together to provide you with the care you need, when you need it.  We recommend signing up for the patient portal called "MyChart".  Sign up information is provided on this After Visit Summary.  MyChart is used to connect with patients for Virtual Visits (Telemedicine).  Patients are able to view lab/test results, encounter notes, upcoming appointments, etc.  Non-urgent messages can be sent to your provider as well.   To learn more about what you can do with MyChart, go to [email or web address].    Your next appointment:   6 month(s)  The format for your next appointment:   In Person  Provider:   You may see Kate Sable, MD or one of the following Advanced Practice Providers on your designated Care Team:    Murray Hodgkins, NP  Christell Faith, PA-C  Marrianne Mood, PA-C  Cadence [place], [place]  Laurann [place], NP

## 2020-07-19 ENCOUNTER — Other Ambulatory Visit: Payer: Self-pay

## 2020-07-19 ENCOUNTER — Emergency Department
Admission: EM | Admit: 2020-07-19 | Discharge: 2020-07-19 | Disposition: A | Discharge status: Home / Self Care | Payer: Medicare Other | Attending: Emergency Medicine | Admitting: Emergency Medicine

## 2020-07-19 DIAGNOSIS — Z91010 Allergy to peanuts: Secondary | ICD-10-CM | POA: Insufficient documentation

## 2020-07-19 DIAGNOSIS — I132 Hypertensive heart and chronic kidney disease with heart failure and with stage 5 chronic kidney disease, or end stage renal disease: Secondary | ICD-10-CM | POA: Diagnosis not present

## 2020-07-19 DIAGNOSIS — U071 COVID-19: Secondary | ICD-10-CM | POA: Diagnosis not present

## 2020-07-19 DIAGNOSIS — I5033 Acute on chronic diastolic (congestive) heart failure: Secondary | ICD-10-CM | POA: Insufficient documentation

## 2020-07-19 DIAGNOSIS — Z79899 Other long term (current) drug therapy: Secondary | ICD-10-CM | POA: Insufficient documentation

## 2020-07-19 DIAGNOSIS — J45909 Unspecified asthma, uncomplicated: Secondary | ICD-10-CM | POA: Insufficient documentation

## 2020-07-19 DIAGNOSIS — N186 End stage renal disease: Secondary | ICD-10-CM | POA: Insufficient documentation

## 2020-07-19 DIAGNOSIS — Z7982 Long term (current) use of aspirin: Secondary | ICD-10-CM | POA: Insufficient documentation

## 2020-07-19 DIAGNOSIS — R519 Headache, unspecified: Secondary | ICD-10-CM | POA: Diagnosis present

## 2020-07-19 MED ORDER — ACETAMINOPHEN 325 MG PO TABS: 650.0000 mg | ORAL_TABLET | Freq: Four times a day (QID) | ORAL | 0 refills | Status: DC | PRN

## 2020-07-19 MED ORDER — ACETAMINOPHEN 325 MG PO TABS: 650.0000 mg | ORAL_TABLET | Freq: Four times a day (QID) | ORAL | 0 refills | Status: AC | PRN

## 2020-07-19 NOTE — ED Provider Notes (Signed)
[facility] Emergency Department Provider Note   ____________________________________________   I have reviewed the triage vital signs and the nursing notes.   HISTORY  Chief Complaint Covid Positive   History limited by: Not Limited   HPI Misty Berry is a 86 y.o. female who presents to the emergency department today from living facility because of concern for weakness, nasal congestion, headache and body ache. The patient states that her symptoms started yesterday. Have continued today. The patient says that her next door neighbor was recently sick with covid and that she tested positive today. She says she has not tried taking any tylenol today.   Records reviewed. Per medical record review patient has a history of memory issues, CHF.   Past Medical History:  Diagnosis Date  . Asthma   . Bruises easily   . Cellulitis   . CHF (congestive heart failure) ([place])   . Cough   . DDD (degenerative disc disease), lumbar   . GERD (gastroesophageal reflux disease)   . HBP (high blood pressure)   . Hypercholesterolemia   . Hyperparathyroidism ([place])   . Osteoarthritis   . Osteoporosis   . Rash   . Renal disorder    kidney disease  . Swelling     Patient Active Problem List   Diagnosis Date Noted  . Atherosclerosis of native arteries of extremity with rest pain ([place]) 03/20/2020  . Complication of vascular access for dialysis 03/20/2020  . Asthmatic bronchitis 02/14/2020  . History of end stage renal disease 12/29/2019  . Swelling   . Hypertensive heart and renal disease   . Palliative care by specialist   . Advanced care planning/counseling discussion   . Acute on chronic diastolic heart failure ([place])   . CHF (congestive heart failure) ([place]) 09/28/2019  . Lymphedema 09/10/2019  . Iron deficiency anemia 08/07/2019  . Acute respiratory failure with hypoxia ([place]) 07/02/2019  . Anemia of chronic kidney failure, stage 5 ([place]) 07/02/2019  . Dyspnea    . Hypoxemia 06/27/2019  . Acute on chronic systolic (congestive) heart failure ([place]) 06/27/2019  . Memory loss 02/06/2019  . Osteoporosis 03/22/2018  . Atrial tachycardia ([place]) 10/03/2017  . HLD (hyperlipidemia) 10/03/2017  . Class 2 severe obesity due to excess calories with serious comorbidity in adult ([place]) 09/10/2016  . Primary osteoarthritis involving multiple joints 06/09/2016  . Chronic pain of left knee 11/28/2015  . Urinary incontinence in female 11/28/2015  . Acid reflux 08/27/2015  . Cellulitis 08/27/2015  . Abnormal bruising 08/27/2015  . Arthritis, degenerative 08/27/2015  . OP (osteoporosis) 08/27/2015  . Presence of artificial eye 08/27/2015  . History of surgical procedure 08/27/2015  . Risk for falls 07/31/2015  . Eye globe prosthesis 07/31/2015  . Hypertensive heart disease with stage 4 chronic kidney disease ([place]) 04/30/2015  . Laceration of forearm without foreign body 04/30/2015  . Hypertensive heart and kidney disease with systolic chf, NYHA class 2 and ckd, stage 4 ([place]) 04/30/2015  . Venous insufficiency of leg 02/28/2015  . Venous insufficiency of both lower extremities 02/28/2015  . Gluteal tendinitis 01/22/2015  . Degenerative arthritis of lumbar spine 12/12/2014  . Back muscle spasm 11/14/2014  . Degeneration of intervertebral disc of lumbar region 11/14/2014  . Neuritis or radiculitis due to rupture of lumbar intervertebral disc 11/14/2014  . Allergic rhinitis 08/31/2014  . Clinical depression 02/19/2014  . Gait instability 02/19/2014  . Depressive disorder, not elsewhere classified 02/19/2014  . Benign hypertensive heart and kidney disease 10/31/2013  .  Left heart failure ([place]) 10/31/2013  . Branch retinal vein occlusion 09/11/2013  . BRVO (branch retinal vein occlusion) 09/11/2013  . Asthma 06/20/2013  . Essential hypertension 06/20/2013  . Follow-up examination 04/30/2013  . Acute renal failure ([place]) 02/16/2013  . Fall 02/16/2013  . Closed  fracture of part of neck of femur ([place]) 02/16/2013  . Cellulitis and abscess of leg 10/30/2012  . Cellulitis of lower extremity 10/30/2012  . End stage renal disease ([place]) 10/30/2012  . Cellulitis of leg, left 10/30/2012  . Central retinal edema, cystoid 01/31/2012  . CME (cystoid macular edema) 01/31/2012  . Impaired vision 12/17/2011  . Complete loss of vision 12/17/2011    Past Surgical History:  Procedure Laterality Date  . APPENDECTOMY    . AV FISTULA PLACEMENT Left 12/12/2019   Procedure: INSERTION OF ARTERIOVENOUS (AV) GORE-TEX GRAFT ARM ( BRACHIAL AXILLARY );  Surgeon: Katha Cabal, MD;  Location: [facility] ORS;  Service: Vascular;  Laterality: Left;  . BREAST REDUCTION SURGERY    . CHOLECYSTECTOMY    . DIALYSIS/PERMA CATHETER INSERTION N/A 10/02/2019   Procedure: DIALYSIS/PERMA CATHETER INSERTION;  Surgeon: Katha Cabal, MD;  Location: [place] [facility];  Service: Cardiovascular;  Laterality: N/A;  . DIALYSIS/PERMA CATHETER REMOVAL N/A 05/06/2020   Procedure: DIALYSIS/PERMA CATHETER REMOVAL;  Surgeon: Katha Cabal, MD;  Location: [place] [facility];  Service: Cardiovascular;  Laterality: N/A;  . EYE SURGERY Left   . FEMUR SURGERY    . FOOT SURGERY Right   . GALLBLADDER SURGERY    . LIPECTOMY    . PAROTID GLAND TUMOR EXCISION    . PERIPHERAL VASCULAR THROMBECTOMY Left 01/16/2020   Procedure: PERIPHERAL VASCULAR THROMBECTOMY;  Surgeon: Katha Cabal, MD;  Location: [place] [facility];  Service: Cardiovascular;  Laterality: Left;  . PERIPHERAL VASCULAR THROMBECTOMY Left 06/26/2020   Procedure: PERIPHERAL VASCULAR THROMBECTOMY;  Surgeon: Algernon Huxley, MD;  Location: [place];  Service: Cardiovascular;  Laterality: Left;  . THYROID SURGERY    . TONSILLECTOMY    . TUBAL LIGATION    . UPPER EXTREMITY ANGIOGRAPHY Left 02/26/2020   Procedure: UPPER EXTREMITY ANGIOGRAPHY;  Surgeon: Katha Cabal, MD;  Location: [place];  Service:  Cardiovascular;  Laterality: Left;  Marland Kitchen VITRECTOMY      Prior to Admission medications   Medication Sig Start Date End Date Taking? Authorizing Provider  acidophilus (RISAQUAD) CAPS capsule Take 2 capsules by mouth 3 (three) times daily. 10/06/19   Lorella Nimrod, MD  aspirin 81 MG chewable tablet Chew 1 tablet (81 mg total) by mouth daily. (0800) 06/26/20   Dew, Erskine Squibb, MD  bumetanide (BUMEX) 1 MG tablet Take 1 tablet (1 mg total) by mouth daily. 10/07/19   Lorella Nimrod, MD  busPIRone (BUSPAR) 5 MG tablet Take 5 mg by mouth daily. (0800) 08/16/19   [provider]  calcium-vitamin D (OSCAL WITH D) 500-200 MG-UNIT tablet Take 1 tablet by mouth daily.    [provider]  Cholecalciferol 50 MCG (2000 UT) CAPS Take by mouth daily.    [provider]  clopidogrel (PLAVIX) 75 MG tablet Take 1 tablet (75 mg total) by mouth daily. 02/26/20   Schnier, Dolores Lory, MD  docusate sodium (COLACE) 100 MG capsule Take 1 capsule (100 mg total) by mouth 2 (two) times daily. 07/02/19   Danford, Suann Larry, MD  esomeprazole (NEXIUM) 40 MG capsule Take 40 mg by mouth daily. (0800)    [provider]  ferrous gluconate (FERGON) 324 MG tablet Take 324 mg by mouth daily.    [provider]  hydrOXYzine (ATARAX/VISTARIL) 25 MG tablet Take 25 mg by mouth 2 (two) times daily as needed.    [provider]  LIDOCAINE-PRILOCAINE EX Apply topically every Monday, Wednesday, and Friday.    [provider]  losartan (COZAAR) 50 MG tablet Take 1 tablet (50 mg total) by mouth daily. 10/07/19   Lorella Nimrod, MD  metoprolol tartrate (LOPRESSOR) 25 MG tablet Take 25 mg by mouth daily.    [provider]  Multiple Vitamins-Minerals (CENTRUM SILVER PO) Take 1 tablet by mouth daily.    [provider]  Omega-3 Fatty Acids (FISH OIL) 1000 MG CAPS Take 4,000 mg by mouth daily.     [provider]  sertraline (ZOLOFT) 100 MG tablet Take 100 mg by mouth  every morning. 0800    [provider]  sevelamer carbonate (RENVELA) 800 MG tablet Take 1,600 mg by mouth 3 (three) times daily with meals.    [provider]  Skin Protectants, Misc. (BAZA PROTECT EX) Apply 1 application topically every 4 (four) hours.     [provider]  traMADol (ULTRAM) 50 MG tablet Take 1 tablet (50 mg total) by mouth every 6 (six) hours as needed for moderate pain. 07/02/19   Danford, Suann Larry, MD    Allergies Ivp dye [iodinated diagnostic agents], Lactose, Methylprednisolone, Peanuts [peanut oil], Vancomycin, and Wellbutrin [bupropion]  No family history on file.  Social History Social History   Tobacco Use  . Smoking status: Never Smoker  . Smokeless tobacco: Never Used  Vaping Use  . Vaping Use: Never used  Substance Use Topics  . Alcohol use: No  . Drug use: No    Review of Systems Constitutional: No fever/chills. Positive for weakness. Eyes: No visual changes. ENT: Positive for nasal congestion. Cardiovascular: Denies chest pain. Respiratory: Denies shortness of breath. Gastrointestinal: No abdominal pain.  No nausea, no vomiting.  No diarrhea.   Genitourinary: Negative for dysuria. Musculoskeletal: Positive for body aches. Skin: Negative for rash. Neurological: Positive for headache. ____________________________________________   PHYSICAL EXAM:  VITAL SIGNS: ED Triage Vitals [07/19/20 1937]  Enc Vitals Group     BP (!) 145/66     Pulse Rate 65     Resp 18     Temp 98.7 F (37.1 C)     Temp Source Oral     SpO2 95 %     Weight 195 lb (88.5 kg)     Height '5\' 2"'$  (1.575 m)     Head Circumference      Peak Flow      Pain Score 0   Constitutional: Alert and oriented.  Eyes: Conjunctivae are normal.  ENT      Head: Normocephalic and atraumatic.      Nose: No congestion/rhinnorhea.      Mouth/Throat: Mucous membranes are moist.      Neck: No stridor. Hematological/Lymphatic/Immunilogical: No cervical  lymphadenopathy. Cardiovascular: Normal rate, regular rhythm.  No murmurs, rubs, or gallops.  Respiratory: Normal respiratory effort without tachypnea nor retractions. Breath sounds are clear and equal bilaterally. No wheezes/rales/rhonchi. Gastrointestinal: Soft and non tender. No rebound. No guarding.  Genitourinary: Deferred Musculoskeletal: Normal range of motion in all extremities. No lower extremity edema. Neurologic:  Normal speech and language. No gross focal neurologic deficits are appreciated.  Skin:  Skin is warm, dry and intact. No rash noted. Psychiatric: Mood and affect are normal. Speech  and behavior are normal. Patient exhibits appropriate insight and judgment.  ____________________________________________    LABS (pertinent positives/negatives)  None  ____________________________________________   EKG  None  ____________________________________________    RADIOLOGY  None  ____________________________________________   PROCEDURES  Procedures  ____________________________________________   INITIAL IMPRESSION / ASSESSMENT AND PLAN / ED COURSE  Pertinent labs & imaging results that were available during my care of the patient were reviewed by me and considered in my medical decision making (see chart for details).   Patient presented to the emergency department today with signs and symptoms consistent with the diagnosis of COVID that she received today.  Vital signs are reassuring.  Patient is not hypoxic.  I did discuss with patient that her symptoms are consistent with her diagnosis of COVID.  At this time do think is reasonable for patient be discharged back to living facility.  ____________________________________________   FINAL CLINICAL IMPRESSION(S) / ED DIAGNOSES  Final diagnoses:  COVID-19     Note: This dictation was prepared with Dragon dictation. Any transcriptional errors that result from this process are unintentional      Nance Pear, MD 07/19/20 2027

## 2020-07-19 NOTE — Discharge Instructions (Signed)
Please seek medical attention for any high fevers, chest pain, shortness of breath, change in behavior, persistent vomiting, bloody stool or any other new or concerning symptoms.  

## 2020-07-19 NOTE — ED Notes (Signed)
[facility] to transport to [facility].

## 2020-07-19 NOTE — ED Triage Notes (Signed)
Pt presents to the [facility] via EMS from [facility] with c/o body aches, fatigue, and nasal congestion after testing positive for COVID earlier today.

## 2020-07-20 ENCOUNTER — Telehealth: Payer: Self-pay | Admitting: Physician Assistant

## 2020-07-20 NOTE — Telephone Encounter (Signed)
Called to discuss with patient about COVID-19 symptoms and the use of one of the available treatments for those with mild to moderate Covid symptoms and at a high risk of hospitalization.  Pt appears to qualify for outpatient treatment due to co-morbid conditions and/or a member of an at-risk group in accordance with the FDA Emergency Use Authorization.    Symptom onset: seems 2/3 Vaccinated: unknown Booster? unknown Immunocompromised? no Qualifiers: BMI, age, HTN, ESRD  Unable to reach pt. Left Voice mail and sent my chart   [facility]

## 2020-07-20 NOTE — Telephone Encounter (Signed)
2nd attempt to contact. I spoke with Ms. Mcgourty who feels better today and declines treatment. Hotline number provided should she change her mind.   Terri Piedra, NP 07/20/2020 1:17 PM

## 2020-07-29 ENCOUNTER — Non-Acute Institutional Stay: Payer: Medicare Other | Admitting: Adult Health Nurse Practitioner

## 2020-07-29 ENCOUNTER — Other Ambulatory Visit: Payer: Self-pay

## 2020-07-29 DIAGNOSIS — Z515 Encounter for palliative care: Secondary | ICD-10-CM

## 2020-07-29 DIAGNOSIS — F0390 Unspecified dementia without behavioral disturbance: Secondary | ICD-10-CM

## 2020-07-29 DIAGNOSIS — N186 End stage renal disease: Secondary | ICD-10-CM

## 2020-07-29 DIAGNOSIS — I5040 Unspecified combined systolic (congestive) and diastolic (congestive) heart failure: Secondary | ICD-10-CM

## 2020-07-29 NOTE — Progress Notes (Signed)
[facility] Pall[facility] Consult Note Telephone: [phone]  Fax: [phone]  PATIENT NAME: Misty Berry DOB: 07-14-1934 MRN: [id number]  PRIMARY CARE PROVIDER:   Franciso Bend, MD  REFERRING PROVIDER:  Franciso Bend, MD [address] [place],  [place] [address]  RESPONSIBLE PARTY:  Beckie Salts brother [phone]  Chief complaint:  Follow up palliative visit/hypotension    RECOMMENDATIONS and PLAN:  1.  Advanced care planning.  Patient is DNR.  Will call brother to update on visit.  2.  ESRD.  Has HD on M,W,F.  She is having hypotension during dialysis at times with no related symptoms, except once as reported below.  Continue dialysis as ordered and follow up and recommendations by nephrology  3.  CHF.  Patient has ongoing problems with edema and requires wraps at times. Is followed by cardiology.  Continue follow up and recommendations by cardiology  4.  Dementia.  Patient is able to ambulate short distances.  Mostly propels self in wheelchair.  Continent of B&B and able to transfer herself to toilet.  Does require assistance with bathing and dressing.  Appetite good and no reported weight changes.  Patient is able to hold a conversation and make needs known.  Appropriately answers questions and follows commands well.  Continue supportive care at facility.   Palliative will continue to monitor for symptom management/decline and make recommendations as needed.  We will follow up in 10 to 12 weeks.  HISTORY OF PRESENT ILLNESS:  Misty Berry is a 86 y.o. year old female with multiple medical problems including ESRD on HD (M,W,F), CHF, dementia, asthma asthma, DDD of lumbar spine, HTN, GERD, OA, hyperparathyroidism. Palliative Care was asked to help address goals of care. Reviewed chart including latest labs.  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  She has no active symptoms. (This provider in full PPE to visit with patient  and kept social distancing as much as possible in room, hand sanitized before and after visit). Patient denies having any cough, SOB, fever, body aches, loss of taste or smell, N/V/D.  Though patient was sent to ER on 07/19/20 after testing positive for COVID the day before with weakness, nasal congestion, headache and generalized body aches. Patient has only received supportive treatment for symptoms.  Did not require supplemental O2 or hospitalization. Patient had port-a-cath removed on 05/06/20 and had to have her fistula evaluated on 06/26/20 as it was nonfunctioning.  This was corrected and patient states dialysis has been going well.  Patient did have an episode on 07/05/20 in which her blood pressure dropped during dialysis and she was light headed.  She was evaluated in the ER with no acute findings.  Rest of 10 ROS asked and negative.    CODE STATUS: DNR  PPS: 40% HOSPICE ELIGIBILITY/DIAGNOSIS: TBD  PHYSICAL EXAM:   General: patient sitting in chair in NAD Eyes: sclera anicteric and noninjected with no discharge noted ENMT: moist mucous membranes Pulmonary: normal respiratory effort Extremities: wraps to bilateral feet and lower legs up to knees, no joint deformities Skin: no rashes on exposed skin Neurological: Weakness; A&O to person and place   PAST MEDICAL HISTORY:  Past Medical History:  Diagnosis Date  . Asthma   . Bruises easily   . Cellulitis   . CHF (congestive heart failure) ([place])   . Cough   . DDD (degenerative disc disease), lumbar   . GERD (gastroesophageal reflux disease)   . HBP (high blood  pressure)   . Hypercholesterolemia   . Hyperparathyroidism ([place])   . Osteoarthritis   . Osteoporosis   . Rash   . Renal disorder    kidney disease  . Swelling     SOCIAL HX:  Social History   Tobacco Use  . Smoking status: Never Smoker  . Smokeless tobacco: Never Used  Substance Use Topics  . Alcohol use: No    ALLERGIES:  Allergies  Allergen Reactions  . Ivp  Dye [Iodinated Diagnostic Agents] Other (See Comments)  . Lactose     unknown  . Methylprednisolone Other (See Comments)    Reaction: Hallucinations and Psychosis  . Peanuts [Peanut Oil] Other (See Comments)    Swelling and vomitting  . Vancomycin Other (See Comments)    Reaction: unknown  . Wellbutrin [Bupropion] Other (See Comments)    Hallucinations     PERTINENT MEDICATIONS:  Outpatient Encounter Medications as of 07/29/2020  Medication Sig  . acetaminophen (TYLENOL) 325 MG tablet Take 2 tablets (650 mg total) by mouth every 6 (six) hours as needed for fever or headache.  Marland Kitchen acidophilus (RISAQUAD) CAPS capsule Take 2 capsules by mouth 3 (three) times daily.  Marland Kitchen aspirin 81 MG chewable tablet Chew 1 tablet (81 mg total) by mouth daily. (0800)  . bumetanide (BUMEX) 1 MG tablet Take 1 tablet (1 mg total) by mouth daily.  . busPIRone (BUSPAR) 5 MG tablet Take 5 mg by mouth daily. (0800)  . calcium-vitamin D (OSCAL WITH D) 500-200 MG-UNIT tablet Take 1 tablet by mouth daily.  . Cholecalciferol 50 MCG (2000 UT) CAPS Take by mouth daily.  . clopidogrel (PLAVIX) 75 MG tablet Take 1 tablet (75 mg total) by mouth daily.  Marland Kitchen docusate sodium (COLACE) 100 MG capsule Take 1 capsule (100 mg total) by mouth 2 (two) times daily.  Marland Kitchen esomeprazole (NEXIUM) 40 MG capsule Take 40 mg by mouth daily. (0800)  . ferrous gluconate (FERGON) 324 MG tablet Take 324 mg by mouth daily.  . hydrOXYzine (ATARAX/VISTARIL) 25 MG tablet Take 25 mg by mouth 2 (two) times daily as needed.  Marland Kitchen LIDOCAINE-PRILOCAINE EX Apply topically every Monday, Wednesday, and Friday.  . losartan (COZAAR) 50 MG tablet Take 1 tablet (50 mg total) by mouth daily.  . metoprolol tartrate (LOPRESSOR) 25 MG tablet Take 25 mg by mouth daily.  . Multiple Vitamins-Minerals (CENTRUM SILVER PO) Take 1 tablet by mouth daily.  . Omega-3 Fatty Acids (FISH OIL) 1000 MG CAPS Take 4,000 mg by mouth daily.   . sertraline (ZOLOFT) 100 MG tablet Take 100 mg by  mouth every morning. 0800  . sevelamer carbonate (RENVELA) 800 MG tablet Take 1,600 mg by mouth 3 (three) times daily with meals.  . Skin Protectants, Misc. (BAZA PROTECT EX) Apply 1 application topically every 4 (four) hours.   . traMADol (ULTRAM) 50 MG tablet Take 1 tablet (50 mg total) by mouth every 6 (six) hours as needed for moderate pain.   No facility-administered encounter medications on file as of 07/29/2020.     Devone Bonilla Jenetta Downer, NP

## 2020-07-31 ENCOUNTER — Ambulatory Visit (INDEPENDENT_AMBULATORY_CARE_PROVIDER_SITE_OTHER): Payer: Medicare Other | Admitting: Nurse Practitioner

## 2020-07-31 ENCOUNTER — Encounter (INDEPENDENT_AMBULATORY_CARE_PROVIDER_SITE_OTHER): Payer: Medicare Other

## 2020-08-14 ENCOUNTER — Other Ambulatory Visit (INDEPENDENT_AMBULATORY_CARE_PROVIDER_SITE_OTHER): Payer: Self-pay | Admitting: Vascular Surgery

## 2020-08-14 DIAGNOSIS — T829XXS Unspecified complication of cardiac and vascular prosthetic device, implant and graft, sequela: Secondary | ICD-10-CM

## 2020-08-14 DIAGNOSIS — Z95820 Peripheral vascular angioplasty status with implants and grafts: Secondary | ICD-10-CM

## 2020-08-21 ENCOUNTER — Other Ambulatory Visit: Payer: Self-pay

## 2020-08-21 ENCOUNTER — Ambulatory Visit (INDEPENDENT_AMBULATORY_CARE_PROVIDER_SITE_OTHER): Payer: Medicare Other | Admitting: Nurse Practitioner

## 2020-08-21 ENCOUNTER — Encounter (INDEPENDENT_AMBULATORY_CARE_PROVIDER_SITE_OTHER): Payer: Self-pay | Admitting: Nurse Practitioner

## 2020-08-21 ENCOUNTER — Ambulatory Visit (INDEPENDENT_AMBULATORY_CARE_PROVIDER_SITE_OTHER): Payer: Medicare Other

## 2020-08-21 VITALS — BP 117/64 | HR 64 | Ht 62.0 in | Wt 180.0 lb

## 2020-08-21 DIAGNOSIS — T829XXS Unspecified complication of cardiac and vascular prosthetic device, implant and graft, sequela: Secondary | ICD-10-CM | POA: Diagnosis not present

## 2020-08-21 DIAGNOSIS — Z95820 Peripheral vascular angioplasty status with implants and grafts: Secondary | ICD-10-CM | POA: Diagnosis not present

## 2020-08-21 DIAGNOSIS — I10 Essential (primary) hypertension: Secondary | ICD-10-CM

## 2020-08-21 DIAGNOSIS — N186 End stage renal disease: Secondary | ICD-10-CM

## 2020-08-21 DIAGNOSIS — E782 Mixed hyperlipidemia: Secondary | ICD-10-CM | POA: Diagnosis not present

## 2020-08-22 ENCOUNTER — Telehealth (INDEPENDENT_AMBULATORY_CARE_PROVIDER_SITE_OTHER): Payer: Self-pay

## 2020-08-22 NOTE — Telephone Encounter (Signed)
I attempted to contact the patient through [facility] for a left arm fistulagram and a message was left for a return call.

## 2020-08-25 ENCOUNTER — Encounter (INDEPENDENT_AMBULATORY_CARE_PROVIDER_SITE_OTHER): Payer: Self-pay | Admitting: Nurse Practitioner

## 2020-08-25 NOTE — Progress Notes (Signed)
Subjective:    Patient ID: Misty Berry, female    DOB: 04-14-1935, 86 y.o.   MRN: [id number] Chief Complaint  Patient presents with  . Follow-up    2 wk post peripheral vascular thrombectomy     The patient returns to the office for followup status post intervention of the dialysis access left brachial axillary AV graft. Following the intervention the graft functions however she continues to have bleeding after dialysis..  The patient continues to be experiencing increased bleeding times following decannulation and increased recirculation with diminished efficiency of their dialysis. The patient denies an increase in arm swelling. At the present time the patient denies hand pain.  The patient denies amaurosis fugax or recent TIA symptoms. There are no recent neurological changes noted. The patient denies claudication symptoms or rest pain symptoms. The patient denies history of DVT, PE or superficial thrombophlebitis. The patient denies recent episodes of angina or shortness of breath.   Today the patient has a flow volume of 998.  There is evidence of stenosis near the proximal graft area.  Previous flow volume was 3205 prior to intervention.   Review of Systems  Neurological: Positive for weakness.  Hematological: Bruises/bleeds easily.  All other systems reviewed and are negative.      Objective:   Physical Exam Vitals reviewed.  HENT:     Head: Normocephalic.  Cardiovascular:     Rate and Rhythm: Normal rate and regular rhythm.     Pulses:          Radial pulses are 1+ on the left side.     Arteriovenous access: left arteriovenous access is present.    Comments: Good thrill and bruit Pulmonary:     Effort: Pulmonary effort is normal.  Skin:    General: Skin is warm and dry.  Neurological:     Mental Status: She is alert and oriented to person, place, and time.     Motor: Weakness present.     Gait: Gait abnormal.  Psychiatric:        Mood and Affect: Mood  normal.        Behavior: Behavior normal.        Thought Content: Thought content normal.        Judgment: Judgment normal.     BP 117/64   Pulse 64   Ht '5\' 2"'$  (1.575 m)   Wt 180 lb (81.6 kg)   BMI 32.92 kg/m   Past Medical History:  Diagnosis Date  . Asthma   . Bruises easily   . Cellulitis   . CHF (congestive heart failure) ([place])   . Cough   . DDD (degenerative disc disease), lumbar   . GERD (gastroesophageal reflux disease)   . HBP (high blood pressure)   . Hypercholesterolemia   . Hyperparathyroidism ([place])   . Osteoarthritis   . Osteoporosis   . Rash   . Renal disorder    kidney disease  . Swelling     Social History   Socioeconomic History  . Marital status: Single    Spouse name: Not on file  . Number of children: 0  . Years of education: Not on file  . Highest education level: Not on file  Occupational History  . Not on file  Tobacco Use  . Smoking status: Never Smoker  . Smokeless tobacco: Never Used  Vaping Use  . Vaping Use: Never used  Substance and Sexual Activity  . Alcohol use: No  . Drug use: No  .  Sexual activity: Not Currently  Other Topics Concern  . Not on file  Social History Narrative   Lives with room mate at home; Kristine Royal own room    Social Determinants of Health   Financial Resource Strain: Not on file  Food Insecurity: Not on file  Transportation Needs: Not on file  Physical Activity: Not on file  Stress: Not on file  Social Connections: Not on file  Intimate Partner Violence: Not on file    Past Surgical History:  Procedure Laterality Date  . APPENDECTOMY    . AV FISTULA PLACEMENT Left 12/12/2019   Procedure: INSERTION OF ARTERIOVENOUS (AV) GORE-TEX GRAFT ARM ( BRACHIAL AXILLARY );  Surgeon: Katha Cabal, MD;  Location: [facility];  Service: Vascular;  Laterality: Left;  . BREAST REDUCTION SURGERY    . CHOLECYSTECTOMY    . DIALYSIS/PERMA CATHETER INSERTION N/A 10/02/2019   Procedure: DIALYSIS/PERMA  CATHETER INSERTION;  Surgeon: Katha Cabal, MD;  Location: [place] [facility];  Service: Cardiovascular;  Laterality: N/A;  . DIALYSIS/PERMA CATHETER REMOVAL N/A 05/06/2020   Procedure: DIALYSIS/PERMA CATHETER REMOVAL;  Surgeon: Katha Cabal, MD;  Location: [place] [facility];  Service: Cardiovascular;  Laterality: N/A;  . EYE SURGERY Left   . FEMUR SURGERY    . FOOT SURGERY Right   . GALLBLADDER SURGERY    . LIPECTOMY    . PAROTID GLAND TUMOR EXCISION    . PERIPHERAL VASCULAR THROMBECTOMY Left 01/16/2020   Procedure: PERIPHERAL VASCULAR THROMBECTOMY;  Surgeon: Katha Cabal, MD;  Location: [place] [facility];  Service: Cardiovascular;  Laterality: Left;  . PERIPHERAL VASCULAR THROMBECTOMY Left 06/26/2020   Procedure: PERIPHERAL VASCULAR THROMBECTOMY;  Surgeon: Algernon Huxley, MD;  Location: [place] [facility];  Service: Cardiovascular;  Laterality: Left;  . THYROID SURGERY    . TONSILLECTOMY    . TUBAL LIGATION    . UPPER EXTREMITY ANGIOGRAPHY Left 02/26/2020   Procedure: UPPER EXTREMITY ANGIOGRAPHY;  Surgeon: Katha Cabal, MD;  Location: [place] [facility];  Service: Cardiovascular;  Laterality: Left;  Marland Kitchen VITRECTOMY      History reviewed. No pertinent family history.  Allergies  Allergen Reactions  . Ivp Dye [Iodinated Diagnostic Agents] Other (See Comments)  . Lactose     unknown  . Methylprednisolone Other (See Comments)    Reaction: Hallucinations and Psychosis  . Peanuts [Peanut Oil] Other (See Comments)    Swelling and vomitting  . Vancomycin Other (See Comments)    Reaction: unknown  . Wellbutrin [Bupropion] Other (See Comments)    Hallucinations    CBC Latest Ref Rng & Units 07/05/2020 01/18/2020 12/12/2019  WBC 4.0 - 10.5 K/uL 6.1 7.6 -  Hemoglobin 12.0 - 15.0 g/dL 11.2(L) 10.6(L) 13.6  Hematocrit 36.0 - 46.0 % 34.0(L) 34.6(L) 40.0  Platelets 150 - 400 K/uL 151 200 -      CMP     Component Value Date/Time   NA 139 07/05/2020 0320   NA  141 04/23/2013 0534   K 4.1 07/05/2020 0320   K 3.9 04/23/2013 0534   CL 96 (L) 07/05/2020 0320   CL 112 (H) 04/23/2013 0534   CO2 31 07/05/2020 0320   CO2 23 04/23/2013 0534   GLUCOSE 89 07/05/2020 0320   GLUCOSE 92 04/23/2013 0534   BUN 37 (H) 07/05/2020 0320   BUN 31 (H) 04/23/2013 0534   CREATININE 3.69 (H) 07/05/2020 0320   CREATININE 1.78 (H) 04/23/2013 0534   CALCIUM 9.4 07/05/2020 0320  CALCIUM 8.6 04/23/2013 0534   PROT 7.7 01/18/2020 2208   PROT 7.1 04/19/2013 1014   ALBUMIN 3.3 (L) 01/18/2020 2208   ALBUMIN 3.1 (L) 04/19/2013 1014   AST 24 01/18/2020 2208   AST 26 04/19/2013 1014   ALT 12 01/18/2020 2208   ALT 18 04/19/2013 1014   ALKPHOS 91 01/18/2020 2208   ALKPHOS 100 04/19/2013 1014   BILITOT 0.7 01/18/2020 2208   BILITOT 0.7 04/19/2013 1014   GFRNONAA 12 (L) 07/05/2020 0320   GFRNONAA 27 (L) 04/23/2013 0534   GFRAA 25 (L) 01/18/2020 2208   GFRAA 32 (L) 04/23/2013 0534     No results found.     Assessment & Plan:   1. End stage renal disease ([place]) Recommend:  The patient is experiencing increasing problems with their dialysis access.  Patient should have a fistulagram with the intention for intervention.  The intention for intervention is to restore appropriate flow and prevent thrombosis and possible loss of the access.  As well as improve the quality of dialysis therapy.  The risks, benefits and alternative therapies were reviewed in detail with the patient.  All questions were answered.  The patient agrees to proceed with angio/intervention.     2. Essential hypertension Continue antihypertensive medications as already ordered, these medications have been reviewed and there are no changes at this time.   3. Mixed hyperlipidemia Continue statin as ordered and reviewed, no changes at this time    Current Outpatient Medications on File Prior to Visit  Medication Sig Dispense Refill  . acidophilus (RISAQUAD) CAPS capsule Take 2 capsules by  mouth 3 (three) times daily.    Marland Kitchen aspirin 81 MG chewable tablet Chew 1 tablet (81 mg total) by mouth daily. (0800) 30 tablet 5  . bumetanide (BUMEX) 1 MG tablet Take 1 tablet (1 mg total) by mouth daily.    . busPIRone (BUSPAR) 5 MG tablet Take 5 mg by mouth daily. (0800)    . calcium-vitamin D (OSCAL WITH D) 500-200 MG-UNIT tablet Take 1 tablet by mouth daily.    . Cholecalciferol 50 MCG (2000 UT) CAPS Take by mouth daily.    . clopidogrel (PLAVIX) 75 MG tablet Take 1 tablet (75 mg total) by mouth daily. 30 tablet 5  . docusate sodium (COLACE) 100 MG capsule Take 1 capsule (100 mg total) by mouth 2 (two) times daily. 10 capsule 0  . ELIQUIS 2.5 MG TABS tablet Take 2.5 mg by mouth 2 (two) times daily.    Marland Kitchen esomeprazole (NEXIUM) 40 MG capsule Take 40 mg by mouth daily. (0800)    . ferrous gluconate (FERGON) 324 MG tablet Take 324 mg by mouth daily.    Marland Kitchen LIDOCAINE-PRILOCAINE EX Apply topically every Monday, Wednesday, and Friday.    . losartan (COZAAR) 50 MG tablet Take 1 tablet (50 mg total) by mouth daily.    . melatonin 3 MG TABS tablet Take 6 mg by mouth at bedtime.    . metoprolol tartrate (LOPRESSOR) 25 MG tablet Take 25 mg by mouth daily.    . Multiple Vitamins-Minerals (CENTRUM SILVER PO) Take 1 tablet by mouth daily.    . Omega-3 Fatty Acids (FISH OIL) 1000 MG CAPS Take 4,000 mg by mouth daily.     . sertraline (ZOLOFT) 100 MG tablet Take 100 mg by mouth every morning. 0800    . sevelamer carbonate (RENVELA) 800 MG tablet Take 1,600 mg by mouth 3 (three) times daily with meals.    . Skin Protectants,  Misc. (BAZA PROTECT EX) Apply 1 application topically every 4 (four) hours.     . traMADol (ULTRAM) 50 MG tablet Take 1 tablet (50 mg total) by mouth every 6 (six) hours as needed for moderate pain. 8 tablet 0  . acetaminophen (TYLENOL) 325 MG tablet Take 2 tablets (650 mg total) by mouth every 6 (six) hours as needed for fever or headache. 50 tablet 0  . hydrOXYzine (ATARAX/VISTARIL) 25 MG  tablet Take 25 mg by mouth 2 (two) times daily as needed.     No current facility-administered medications on file prior to visit.    There are no Patient Instructions on file for this visit. No follow-ups on file.   Kris Hartmann, NP

## 2020-08-25 NOTE — Telephone Encounter (Signed)
Misty Berry form [facility] facility called and the patient is scheduled with Dr. Lucky Cowboy for a left arm fistulagram on 09/04/20 with a  8:15 am arrival time to the [facility]. Covid testing on 09/02/20 between 8-2 pm at the [facility]. Pre-procedure instructions will be faxed to [facility] facility to [employer].

## 2020-08-25 NOTE — H&P (View-Only) (Signed)
Subjective:    Patient ID: Misty Berry, female    DOB: 1934-12-14, 86 y.o.   MRN: [id number] Chief Complaint  Patient presents with  . Follow-up    2 wk post peripheral vascular thrombectomy     The patient returns to the office for followup status post intervention of the dialysis access left brachial axillary AV graft. Following the intervention the graft functions however she continues to have bleeding after dialysis..  The patient continues to be experiencing increased bleeding times following decannulation and increased recirculation with diminished efficiency of their dialysis. The patient denies an increase in arm swelling. At the present time the patient denies hand pain.  The patient denies amaurosis fugax or recent TIA symptoms. There are no recent neurological changes noted. The patient denies claudication symptoms or rest pain symptoms. The patient denies history of DVT, PE or superficial thrombophlebitis. The patient denies recent episodes of angina or shortness of breath.   Today the patient has a flow volume of 998.  There is evidence of stenosis near the proximal graft area.  Previous flow volume was 3205 prior to intervention.   Review of Systems  Neurological: Positive for weakness.  Hematological: Bruises/bleeds easily.  All other systems reviewed and are negative.      Objective:   Physical Exam Vitals reviewed.  HENT:     Head: Normocephalic.  Cardiovascular:     Rate and Rhythm: Normal rate and regular rhythm.     Pulses:          Radial pulses are 1+ on the left side.     Arteriovenous access: left arteriovenous access is present.    Comments: Good thrill and bruit Pulmonary:     Effort: Pulmonary effort is normal.  Skin:    General: Skin is warm and dry.  Neurological:     Mental Status: She is alert and oriented to person, place, and time.     Motor: Weakness present.     Gait: Gait abnormal.  Psychiatric:        Mood and Affect: Mood  normal.        Behavior: Behavior normal.        Thought Content: Thought content normal.        Judgment: Judgment normal.     BP 117/64   Pulse 64   Ht '5\' 2"'$  (1.575 m)   Wt 180 lb (81.6 kg)   BMI 32.92 kg/m   Past Medical History:  Diagnosis Date  . Asthma   . Bruises easily   . Cellulitis   . CHF (congestive heart failure) ([place])   . Cough   . DDD (degenerative disc disease), lumbar   . GERD (gastroesophageal reflux disease)   . HBP (high blood pressure)   . Hypercholesterolemia   . Hyperparathyroidism ([place])   . Osteoarthritis   . Osteoporosis   . Rash   . Renal disorder    kidney disease  . Swelling     Social History   Socioeconomic History  . Marital status: Single    Spouse name: Not on file  . Number of children: 0  . Years of education: Not on file  . Highest education level: Not on file  Occupational History  . Not on file  Tobacco Use  . Smoking status: Never Smoker  . Smokeless tobacco: Never Used  Vaping Use  . Vaping Use: Never used  Substance and Sexual Activity  . Alcohol use: No  . Drug use: No  .  Sexual activity: Not Currently  Other Topics Concern  . Not on file  Social History Narrative   Lives with room mate at home; Kristine Royal own room    Social Determinants of Health   Financial Resource Strain: Not on file  Food Insecurity: Not on file  Transportation Needs: Not on file  Physical Activity: Not on file  Stress: Not on file  Social Connections: Not on file  Intimate Partner Violence: Not on file    Past Surgical History:  Procedure Laterality Date  . APPENDECTOMY    . AV FISTULA PLACEMENT Left 12/12/2019   Procedure: INSERTION OF ARTERIOVENOUS (AV) GORE-TEX GRAFT ARM ( BRACHIAL AXILLARY );  Surgeon: Katha Cabal, MD;  Location: [facility];  Service: Vascular;  Laterality: Left;  . BREAST REDUCTION SURGERY    . CHOLECYSTECTOMY    . DIALYSIS/PERMA CATHETER INSERTION N/A 10/02/2019   Procedure: DIALYSIS/PERMA  CATHETER INSERTION;  Surgeon: Katha Cabal, MD;  Location: [place] [facility];  Service: Cardiovascular;  Laterality: N/A;  . DIALYSIS/PERMA CATHETER REMOVAL N/A 05/06/2020   Procedure: DIALYSIS/PERMA CATHETER REMOVAL;  Surgeon: Katha Cabal, MD;  Location: [place] [facility];  Service: Cardiovascular;  Laterality: N/A;  . EYE SURGERY Left   . FEMUR SURGERY    . FOOT SURGERY Right   . GALLBLADDER SURGERY    . LIPECTOMY    . PAROTID GLAND TUMOR EXCISION    . PERIPHERAL VASCULAR THROMBECTOMY Left 01/16/2020   Procedure: PERIPHERAL VASCULAR THROMBECTOMY;  Surgeon: Katha Cabal, MD;  Location: [place] [facility];  Service: Cardiovascular;  Laterality: Left;  . PERIPHERAL VASCULAR THROMBECTOMY Left 06/26/2020   Procedure: PERIPHERAL VASCULAR THROMBECTOMY;  Surgeon: Algernon Huxley, MD;  Location: [place] [facility];  Service: Cardiovascular;  Laterality: Left;  . THYROID SURGERY    . TONSILLECTOMY    . TUBAL LIGATION    . UPPER EXTREMITY ANGIOGRAPHY Left 02/26/2020   Procedure: UPPER EXTREMITY ANGIOGRAPHY;  Surgeon: Katha Cabal, MD;  Location: [place] [facility];  Service: Cardiovascular;  Laterality: Left;  Marland Kitchen VITRECTOMY      History reviewed. No pertinent family history.  Allergies  Allergen Reactions  . Ivp Dye [Iodinated Diagnostic Agents] Other (See Comments)  . Lactose     unknown  . Methylprednisolone Other (See Comments)    Reaction: Hallucinations and Psychosis  . Peanuts [Peanut Oil] Other (See Comments)    Swelling and vomitting  . Vancomycin Other (See Comments)    Reaction: unknown  . Wellbutrin [Bupropion] Other (See Comments)    Hallucinations    CBC Latest Ref Rng & Units 07/05/2020 01/18/2020 12/12/2019  WBC 4.0 - 10.5 K/uL 6.1 7.6 -  Hemoglobin 12.0 - 15.0 g/dL 11.2(L) 10.6(L) 13.6  Hematocrit 36.0 - 46.0 % 34.0(L) 34.6(L) 40.0  Platelets 150 - 400 K/uL 151 200 -      CMP     Component Value Date/Time   NA 139 07/05/2020 0320   NA  141 04/23/2013 0534   K 4.1 07/05/2020 0320   K 3.9 04/23/2013 0534   CL 96 (L) 07/05/2020 0320   CL 112 (H) 04/23/2013 0534   CO2 31 07/05/2020 0320   CO2 23 04/23/2013 0534   GLUCOSE 89 07/05/2020 0320   GLUCOSE 92 04/23/2013 0534   BUN 37 (H) 07/05/2020 0320   BUN 31 (H) 04/23/2013 0534   CREATININE 3.69 (H) 07/05/2020 0320   CREATININE 1.78 (H) 04/23/2013 0534   CALCIUM 9.4 07/05/2020 0320  CALCIUM 8.6 04/23/2013 0534   PROT 7.7 01/18/2020 2208   PROT 7.1 04/19/2013 1014   ALBUMIN 3.3 (L) 01/18/2020 2208   ALBUMIN 3.1 (L) 04/19/2013 1014   AST 24 01/18/2020 2208   AST 26 04/19/2013 1014   ALT 12 01/18/2020 2208   ALT 18 04/19/2013 1014   ALKPHOS 91 01/18/2020 2208   ALKPHOS 100 04/19/2013 1014   BILITOT 0.7 01/18/2020 2208   BILITOT 0.7 04/19/2013 1014   GFRNONAA 12 (L) 07/05/2020 0320   GFRNONAA 27 (L) 04/23/2013 0534   GFRAA 25 (L) 01/18/2020 2208   GFRAA 32 (L) 04/23/2013 0534     No results found.     Assessment & Plan:   1. End stage renal disease ([place]) Recommend:  The patient is experiencing increasing problems with their dialysis access.  Patient should have a fistulagram with the intention for intervention.  The intention for intervention is to restore appropriate flow and prevent thrombosis and possible loss of the access.  As well as improve the quality of dialysis therapy.  The risks, benefits and alternative therapies were reviewed in detail with the patient.  All questions were answered.  The patient agrees to proceed with angio/intervention.     2. Essential hypertension Continue antihypertensive medications as already ordered, these medications have been reviewed and there are no changes at this time.   3. Mixed hyperlipidemia Continue statin as ordered and reviewed, no changes at this time    Current Outpatient Medications on File Prior to Visit  Medication Sig Dispense Refill  . acidophilus (RISAQUAD) CAPS capsule Take 2 capsules by  mouth 3 (three) times daily.    Marland Kitchen aspirin 81 MG chewable tablet Chew 1 tablet (81 mg total) by mouth daily. (0800) 30 tablet 5  . bumetanide (BUMEX) 1 MG tablet Take 1 tablet (1 mg total) by mouth daily.    . busPIRone (BUSPAR) 5 MG tablet Take 5 mg by mouth daily. (0800)    . calcium-vitamin D (OSCAL WITH D) 500-200 MG-UNIT tablet Take 1 tablet by mouth daily.    . Cholecalciferol 50 MCG (2000 UT) CAPS Take by mouth daily.    . clopidogrel (PLAVIX) 75 MG tablet Take 1 tablet (75 mg total) by mouth daily. 30 tablet 5  . docusate sodium (COLACE) 100 MG capsule Take 1 capsule (100 mg total) by mouth 2 (two) times daily. 10 capsule 0  . ELIQUIS 2.5 MG TABS tablet Take 2.5 mg by mouth 2 (two) times daily.    Marland Kitchen esomeprazole (NEXIUM) 40 MG capsule Take 40 mg by mouth daily. (0800)    . ferrous gluconate (FERGON) 324 MG tablet Take 324 mg by mouth daily.    Marland Kitchen LIDOCAINE-PRILOCAINE EX Apply topically every Monday, Wednesday, and Friday.    . losartan (COZAAR) 50 MG tablet Take 1 tablet (50 mg total) by mouth daily.    . melatonin 3 MG TABS tablet Take 6 mg by mouth at bedtime.    . metoprolol tartrate (LOPRESSOR) 25 MG tablet Take 25 mg by mouth daily.    . Multiple Vitamins-Minerals (CENTRUM SILVER PO) Take 1 tablet by mouth daily.    . Omega-3 Fatty Acids (FISH OIL) 1000 MG CAPS Take 4,000 mg by mouth daily.     . sertraline (ZOLOFT) 100 MG tablet Take 100 mg by mouth every morning. 0800    . sevelamer carbonate (RENVELA) 800 MG tablet Take 1,600 mg by mouth 3 (three) times daily with meals.    . Skin Protectants,  Misc. (BAZA PROTECT EX) Apply 1 application topically every 4 (four) hours.     . traMADol (ULTRAM) 50 MG tablet Take 1 tablet (50 mg total) by mouth every 6 (six) hours as needed for moderate pain. 8 tablet 0  . acetaminophen (TYLENOL) 325 MG tablet Take 2 tablets (650 mg total) by mouth every 6 (six) hours as needed for fever or headache. 50 tablet 0  . hydrOXYzine (ATARAX/VISTARIL) 25 MG  tablet Take 25 mg by mouth 2 (two) times daily as needed.     No current facility-administered medications on file prior to visit.    There are no Patient Instructions on file for this visit. No follow-ups on file.   Kris Hartmann, NP

## 2020-09-02 ENCOUNTER — Other Ambulatory Visit
Admission: RE | Admit: 2020-09-02 | Discharge: 2020-09-02 | Disposition: A | Discharge status: Home / Self Care | Payer: Medicare Other | Source: Ambulatory Visit | Attending: Vascular Surgery | Admitting: Vascular Surgery

## 2020-09-02 ENCOUNTER — Other Ambulatory Visit: Payer: Self-pay

## 2020-09-02 DIAGNOSIS — Z01812 Encounter for preprocedural laboratory examination: Secondary | ICD-10-CM | POA: Insufficient documentation

## 2020-09-02 DIAGNOSIS — Z20822 Contact with and (suspected) exposure to covid-19: Secondary | ICD-10-CM | POA: Insufficient documentation

## 2020-09-02 LAB — SARS CORONAVIRUS 2 (TAT 6-24 HRS): SARS Coronavirus 2: NEGATIVE

## 2020-09-04 ENCOUNTER — Encounter
Admission: RE | Disposition: A | Discharge status: Home / Self Care | Payer: Self-pay | Source: Home / Self Care | Attending: Vascular Surgery

## 2020-09-04 ENCOUNTER — Other Ambulatory Visit (INDEPENDENT_AMBULATORY_CARE_PROVIDER_SITE_OTHER): Payer: Self-pay | Admitting: Nurse Practitioner

## 2020-09-04 ENCOUNTER — Ambulatory Visit
Admission: RE | Admit: 2020-09-04 | Discharge: 2020-09-04 | Disposition: A | Discharge status: Home / Self Care | Payer: Medicare Other | Attending: Vascular Surgery | Admitting: Vascular Surgery

## 2020-09-04 ENCOUNTER — Encounter: Payer: Self-pay | Admitting: Vascular Surgery

## 2020-09-04 ENCOUNTER — Other Ambulatory Visit: Payer: Self-pay

## 2020-09-04 DIAGNOSIS — Z7902 Long term (current) use of antithrombotics/antiplatelets: Secondary | ICD-10-CM | POA: Diagnosis not present

## 2020-09-04 DIAGNOSIS — E782 Mixed hyperlipidemia: Secondary | ICD-10-CM | POA: Insufficient documentation

## 2020-09-04 DIAGNOSIS — Z888 Allergy status to other drugs, medicaments and biological substances status: Secondary | ICD-10-CM | POA: Insufficient documentation

## 2020-09-04 DIAGNOSIS — Z7982 Long term (current) use of aspirin: Secondary | ICD-10-CM | POA: Insufficient documentation

## 2020-09-04 DIAGNOSIS — Z7901 Long term (current) use of anticoagulants: Secondary | ICD-10-CM | POA: Diagnosis not present

## 2020-09-04 DIAGNOSIS — Z91010 Allergy to peanuts: Secondary | ICD-10-CM | POA: Diagnosis not present

## 2020-09-04 DIAGNOSIS — Z79899 Other long term (current) drug therapy: Secondary | ICD-10-CM | POA: Diagnosis not present

## 2020-09-04 DIAGNOSIS — Z91041 Radiographic dye allergy status: Secondary | ICD-10-CM | POA: Insufficient documentation

## 2020-09-04 DIAGNOSIS — I120 Hypertensive chronic kidney disease with stage 5 chronic kidney disease or end stage renal disease: Secondary | ICD-10-CM | POA: Insufficient documentation

## 2020-09-04 DIAGNOSIS — Z992 Dependence on renal dialysis: Secondary | ICD-10-CM | POA: Diagnosis not present

## 2020-09-04 DIAGNOSIS — Y841 Kidney dialysis as the cause of abnormal reaction of the patient, or of later complication, without mention of misadventure at the time of the procedure: Secondary | ICD-10-CM | POA: Insufficient documentation

## 2020-09-04 DIAGNOSIS — N186 End stage renal disease: Secondary | ICD-10-CM | POA: Diagnosis not present

## 2020-09-04 DIAGNOSIS — Z881 Allergy status to other antibiotic agents status: Secondary | ICD-10-CM | POA: Insufficient documentation

## 2020-09-04 DIAGNOSIS — T82898A Other specified complication of vascular prosthetic devices, implants and grafts, initial encounter: Secondary | ICD-10-CM | POA: Diagnosis not present

## 2020-09-04 DIAGNOSIS — T82510A Breakdown (mechanical) of surgically created arteriovenous fistula, initial encounter: Secondary | ICD-10-CM | POA: Diagnosis present

## 2020-09-04 HISTORY — PX: A/V FISTULAGRAM: CATH118298

## 2020-09-04 LAB — POTASSIUM (ARMC VASCULAR LAB ONLY): Potassium (ARMC vascular lab): 4.2 (ref 3.5–5.1)

## 2020-09-04 SURGERY — A/V FISTULAGRAM
Anesthesia: Moderate Sedation | Laterality: Left

## 2020-09-04 MED ORDER — HEPARIN SODIUM (PORCINE) 1000 UNIT/ML IJ SOLN
INTRAMUSCULAR | Status: AC
  Filled 2020-09-04: qty 1

## 2020-09-04 MED ORDER — DIPHENHYDRAMINE HCL 50 MG/ML IJ SOLN: 50.0000 mg | Freq: Once | INTRAMUSCULAR | Status: AC | PRN

## 2020-09-04 MED ORDER — FAMOTIDINE 20 MG PO TABS
ORAL_TABLET | ORAL | Status: AC
  Administered 2020-09-04: 40 mg via ORAL
  Filled 2020-09-04: qty 2

## 2020-09-04 MED ORDER — HYDROCORTISONE NA SUCCINATE PF 100 MG IJ SOLR
125.0000 mg | Freq: Four times a day (QID) | INTRAMUSCULAR | Status: DC
  Administered 2020-09-04: 125 mg via INTRAVENOUS
  Filled 2020-09-04: qty 2.5

## 2020-09-04 MED ORDER — MIDAZOLAM HCL 2 MG/2ML IJ SOLN
INTRAMUSCULAR | Status: DC | PRN
  Administered 2020-09-04: 2 mg via INTRAVENOUS

## 2020-09-04 MED ORDER — ONDANSETRON HCL 4 MG/2ML IJ SOLN: 4.0000 mg | Freq: Four times a day (QID) | INTRAMUSCULAR | Status: DC | PRN

## 2020-09-04 MED ORDER — CEFAZOLIN SODIUM-DEXTROSE 1-4 GM/50ML-% IV SOLN
1.0000 g | Freq: Once | INTRAVENOUS | Status: AC
  Administered 2020-09-04: 1 g via INTRAVENOUS

## 2020-09-04 MED ORDER — DIPHENHYDRAMINE HCL 50 MG/ML IJ SOLN
INTRAMUSCULAR | Status: AC
  Administered 2020-09-04: 50 mg via INTRAVENOUS
  Filled 2020-09-04: qty 1

## 2020-09-04 MED ORDER — SODIUM CHLORIDE 0.9 % IV SOLN: INTRAVENOUS | Status: DC

## 2020-09-04 MED ORDER — MIDAZOLAM HCL 2 MG/ML PO SYRP: 8.0000 mg | ORAL_SOLUTION | Freq: Once | ORAL | Status: DC | PRN

## 2020-09-04 MED ORDER — FENTANYL CITRATE (PF) 100 MCG/2ML IJ SOLN
INTRAMUSCULAR | Status: AC
  Filled 2020-09-04: qty 2

## 2020-09-04 MED ORDER — FENTANYL CITRATE (PF) 100 MCG/2ML IJ SOLN
INTRAMUSCULAR | Status: DC | PRN
  Administered 2020-09-04: 50 ug via INTRAVENOUS

## 2020-09-04 MED ORDER — HYDROMORPHONE HCL 1 MG/ML IJ SOLN: 1.0000 mg | Freq: Once | INTRAMUSCULAR | Status: DC | PRN

## 2020-09-04 MED ORDER — HEPARIN SODIUM (PORCINE) 1000 UNIT/ML IJ SOLN
INTRAMUSCULAR | Status: DC | PRN
  Administered 2020-09-04: 3000 [IU] via INTRAVENOUS

## 2020-09-04 MED ORDER — MIDAZOLAM HCL 2 MG/2ML IJ SOLN
INTRAMUSCULAR | Status: AC
  Filled 2020-09-04: qty 2

## 2020-09-04 MED ORDER — FAMOTIDINE 20 MG PO TABS: 40.0000 mg | ORAL_TABLET | Freq: Once | ORAL | Status: AC | PRN

## 2020-09-04 SURGICAL SUPPLY — 10 items
BALLN LUTONIX DCB 7X60X130 (BALLOONS) ×2
BALLOON LUTONIX DCB 7X60X130 (BALLOONS) ×1 IMPLANT
COVER PROBE U/S 5X48 (MISCELLANEOUS) ×2 IMPLANT
DRAPE BRACHIAL (DRAPES) ×2 IMPLANT
GLIDEWIRE ADV .035X180CM (WIRE) ×2 IMPLANT
KIT ENCORE 26 ADVANTAGE (KITS) ×2 IMPLANT
KIT MICROPUNCTURE NIT STIFF (SHEATH) ×2 IMPLANT
PACK ANGIOGRAPHY (CUSTOM PROCEDURE TRAY) ×2 IMPLANT
SHEATH BRITE TIP 6FRX5.5 (SHEATH) ×2 IMPLANT
SUT MNCRL AB 4-0 PS2 18 (SUTURE) ×2 IMPLANT

## 2020-09-04 NOTE — Interval H&P Note (Signed)
History and Physical Interval Note:  09/04/2020 8:54 AM  Misty Berry  has presented today for surgery, with the diagnosis of LT arm fistulagram   End Stage Renal Covid March 22.  The various methods of treatment have been discussed with the patient and family. After consideration of risks, benefits and other options for treatment, the patient has consented to  Procedure(s): A/V FISTULAGRAM (Left) as a surgical intervention.  The patient's history has been reviewed, patient examined, no change in status, stable for surgery.  I have reviewed the patient's chart and labs.  Questions were answered to the patient's satisfaction.     Leotis Pain

## 2020-09-04 NOTE — Progress Notes (Signed)
Called [phone] and spoke with Misty Berry with [facility] and updated Misty Berry regarding procedure, moderate sedation, and that Dr. Lucky Cowboy stated may use the AV Fistula for dialysis starting tomorrow, 09/05/2020 and Misty Berry verbalized understanding.

## 2020-09-04 NOTE — Op Note (Signed)
Misty Berry    OPERATIVE NOTE   PROCEDURE: 1.  Left brachial artery to axillary vein arteriovenous graft cannulation under ultrasound guidance 2.  Left arm shuntogram 3.  Percutaneous transluminal angioplasty of left axillary vein with 7 mm diameter by 6 cm length Lutonix drug-coated angioplasty balloon  PRE-OPERATIVE DIAGNOSIS: 1. ESRD 2. Malfunctioning bleeding left brachial artery to axillary vein arteriovenous graft  POST-OPERATIVE DIAGNOSIS: same as above   SURGEON: Leotis Pain, MD  ANESTHESIA: local with MCS  ESTIMATED BLOOD LOSS: 3 cc  FINDING(S): 1. 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  The remainder of the graft was widely patent without significant stenosis.  The remainder of the central venous circulation was widely patent without significant stenosis.  SPECIMEN(S):  None  CONTRAST: 20 cc  FLUORO TIME: 0.9 minutes  MODERATE CONSCIOUS SEDATION TIME:  Approximately 15 minutes using 2 mg of Versed and 50 mcg of Fentanyl  INDICATIONS: Misty Berry is a 86 y.o. female who presents with malfunctioning left brachial artery to axillary vein arteriovenous graft.  She is having significant bleeding.  The patient is scheduled for left arm shuntogram.  The patient is aware the risks include but are not limited to: bleeding, infection, thrombosis of the cannulated access, and possible anaphylactic reaction to the contrast.  The patient is aware of the risks of the procedure and elects to proceed forward.  DESCRIPTION: After full informed written consent was obtained, the patient was brought back to the angiography suite and placed supine upon the angiography table.  The patient was connected to monitoring equipment. Moderate conscious sedation was administered during a face to face encounter throughout the procedure with my supervision of the RN administering medicines and  monitoring the patient's vital signs, pulse oximetry, telemetry and mental status throughout from the start of the procedure until the patient was taken to the recovery room The left arm was prepped and draped in the standard fashion for a percutaneous access intervention.  Under ultrasound guidance, the left brachial artery to axillary vein arteriovenous graft was cannulated with a micropuncture needle under direct ultrasound guidance were it was patent and a permanent image was performed.  The microwire was advanced into the graft and the needle was exchanged for the a microsheath.  I then upsized to a 6 Fr Sheath and imaging was performed.  Hand injections were completed to image the access including the central venous system. 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  The remainder of the graft was widely patent without significant stenosis.  The remainder of the central venous circulation was widely patent without significant stenosis.  Based on the images, this patient will need intervention to this venous stenosis to improve her outflow, reduce bleeding, and save the graft. I then gave the patient 3000 units of intravenous heparin.  I then crossed the stenosis with an advantage wire.  Based on the imaging, a 7 mm x 6 cm Lutonix drug-coated angioplasty balloon was selected.  The balloon was centered around the stenosis and inflated to 10 ATM for 1 minute(s).  On completion imaging, a 10-15% residual stenosis was present.     Based on the completion imaging, no further intervention is necessary.  The wire and balloon were removed from the sheath.  A 4-0 Monocryl purse-string suture was sewn around the sheath.  The  sheath was removed while tying down the suture.  A sterile bandage was applied to the puncture site.  COMPLICATIONS: None  CONDITION: Stable   Leotis Pain  09/04/2020 10:23 AM    This note was  created with Dragon Medical transcription system. Any errors in dictation are purely unintentional.

## 2020-09-18 ENCOUNTER — Ambulatory Visit (INDEPENDENT_AMBULATORY_CARE_PROVIDER_SITE_OTHER): Payer: Medicare Other | Admitting: Nurse Practitioner

## 2020-09-18 ENCOUNTER — Encounter (INDEPENDENT_AMBULATORY_CARE_PROVIDER_SITE_OTHER): Payer: Medicare Other

## 2020-11-19 IMAGING — CR DG CHEST 2V
3 series · 3 of 3 positions shown · non-contrast
Comparison: September 28, 2019

CLINICAL DATA: Low blood pressure.

EXAM:
CHEST - 2 VIEW

[chest pa]
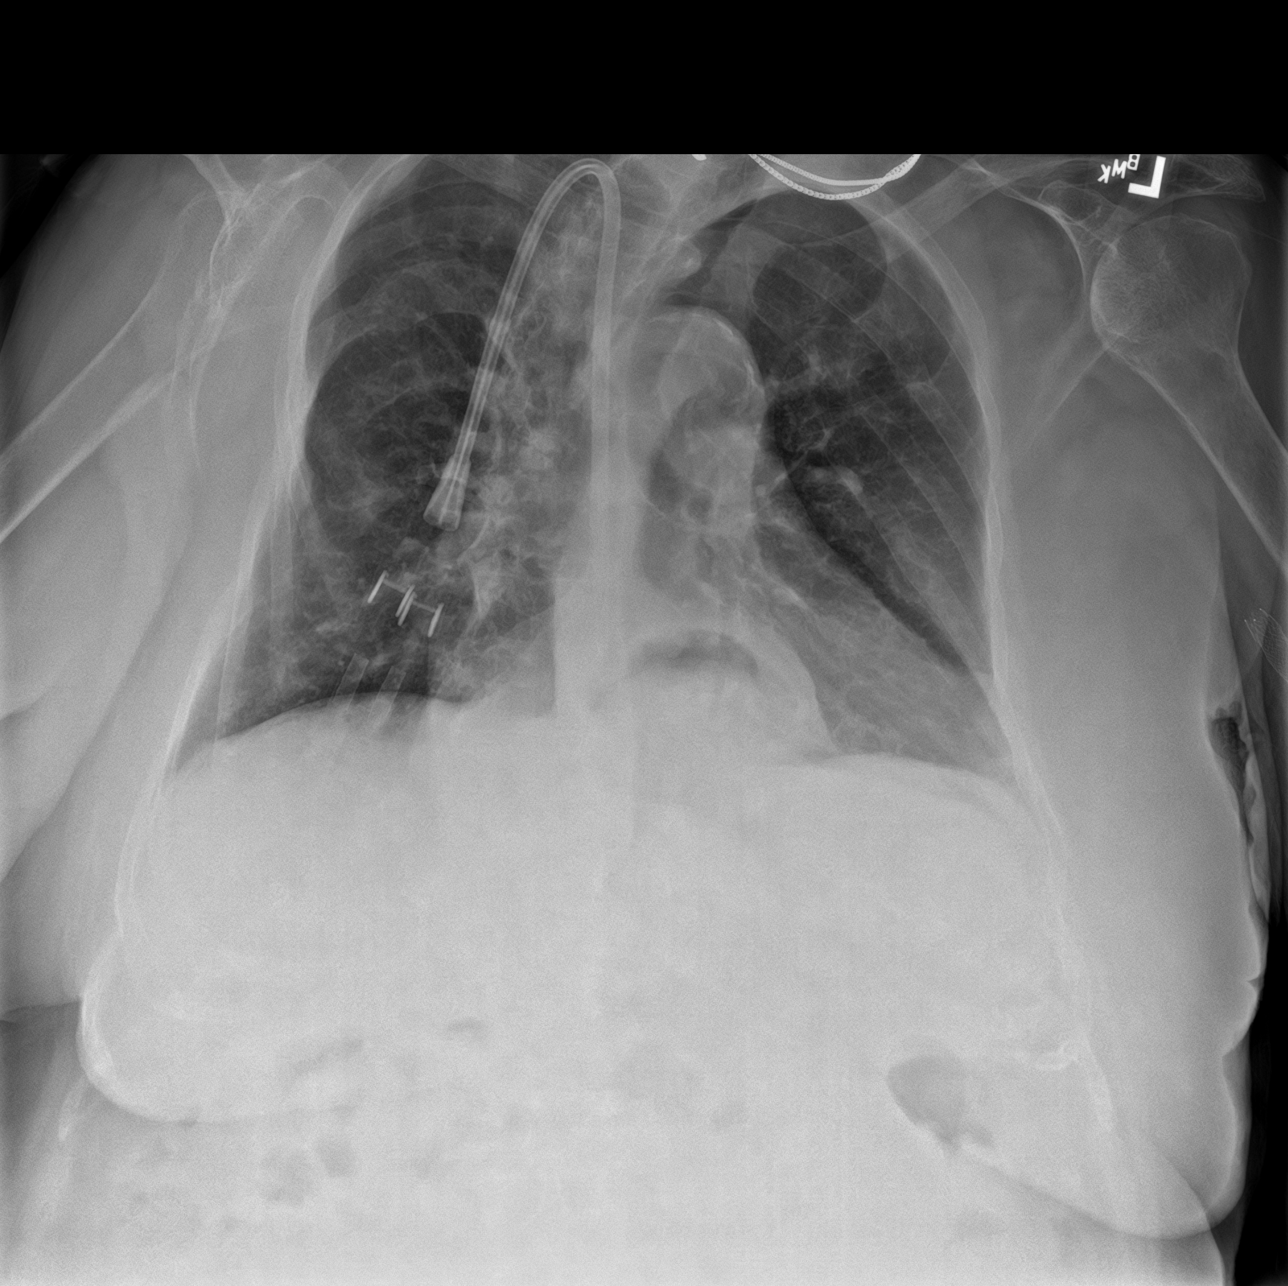

[chest lat (1 of 2)]
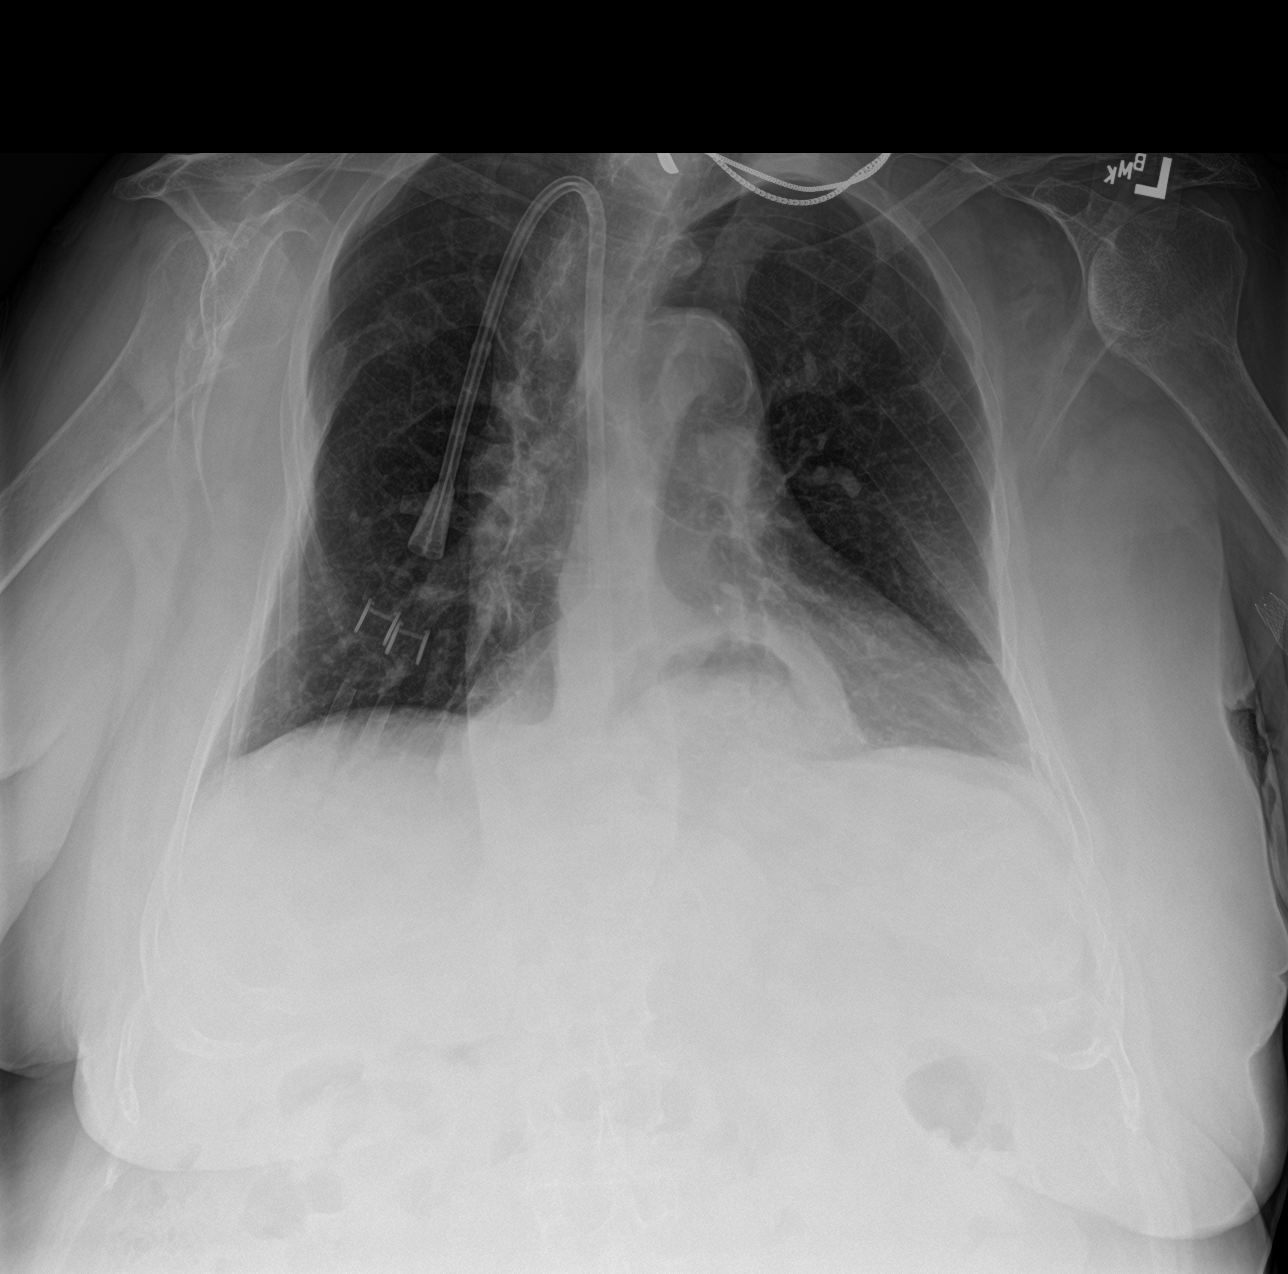

[chest lat (2 of 2)]
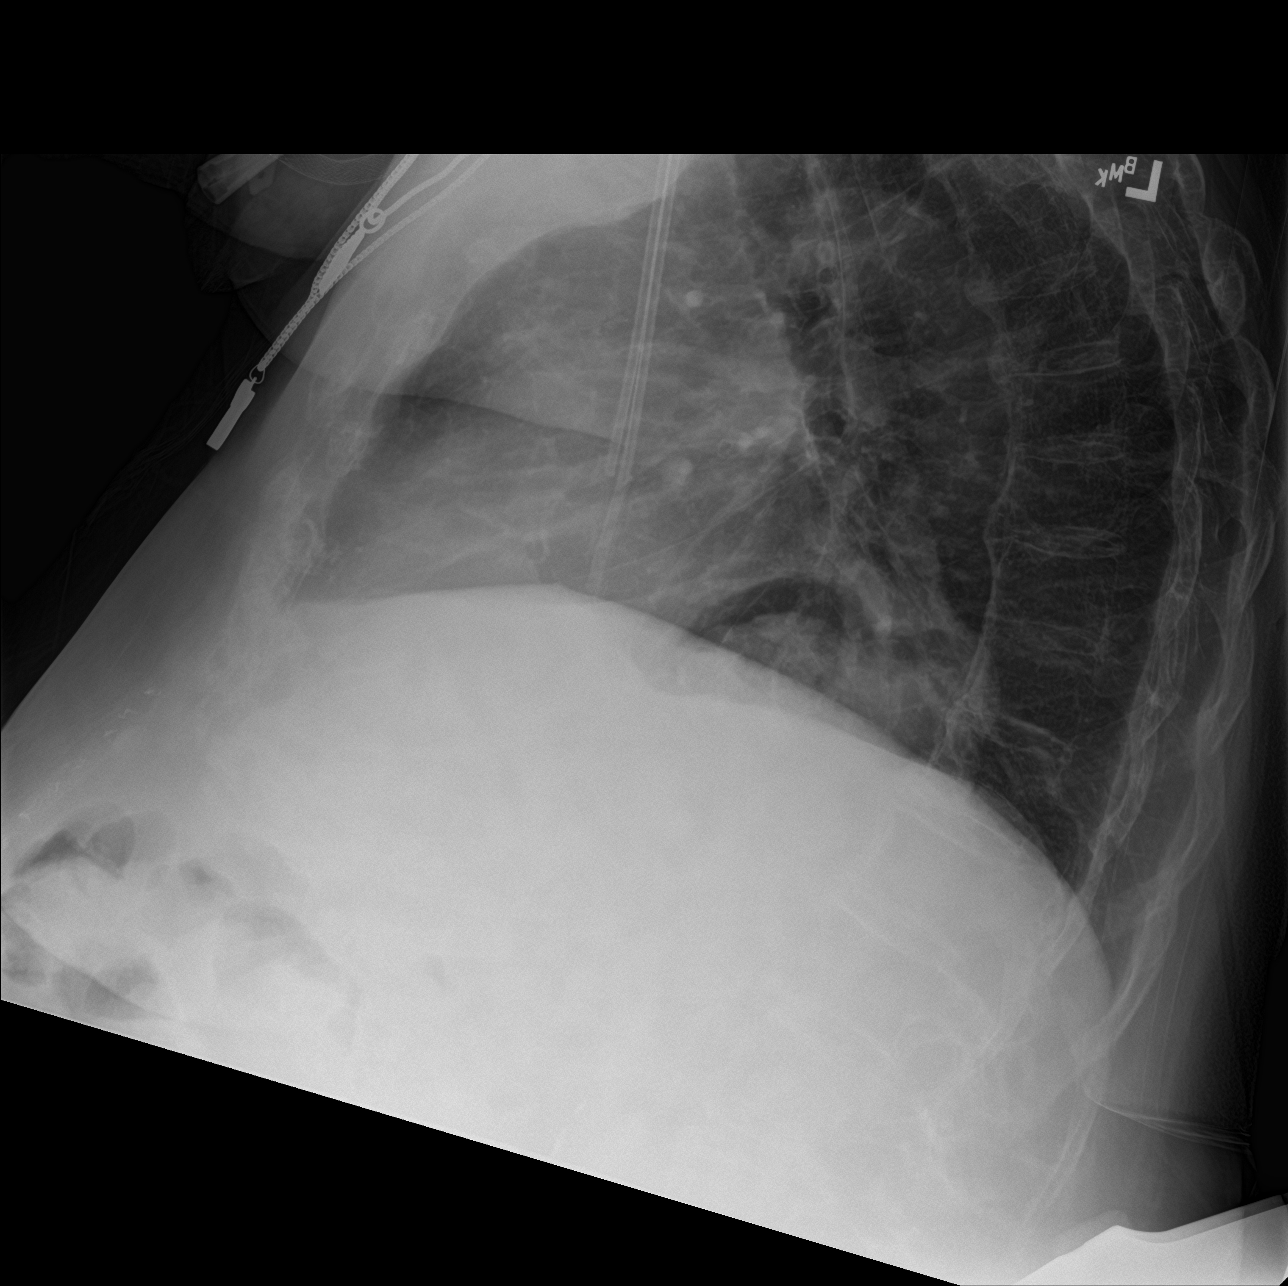

[3 of 3 positions shown; findings below may reference images not displayed]

FINDINGS: A right-sided venous catheter is seen with its distal tip noted just
beyond the junction of the superior vena cava and right atrium. Mild
diffuse chronic appearing increased lung markings are present. There
is no evidence of acute infiltrate, pleural effusion or
pneumothorax. The heart size and mediastinal contours are within
normal limits. There is mild calcification of the abdominal aorta. A
large hiatal hernia is seen. A chronic sixth right rib deformity is
noted. Multilevel degenerative changes seen throughout the thoracic
spine.
IMPRESSION: 1. No acute or active cardiopulmonary disease.
2. Large hiatal hernia.

## 2021-01-12 ENCOUNTER — Ambulatory Visit: Payer: Medicare Other | Admitting: Cardiology

## 2021-01-12 DEATH — deceased

## 2022-04-21 NOTE — Telephone Encounter (Signed)
error
